# Patient Record
Sex: Female | Born: 1945 | Race: White | Hispanic: No | Smoking: Current every day smoker
Health system: Southern US, Community
[De-identification: ages and names within clinical notes are randomized; demographics above are authoritative.]

## PROBLEM LIST (undated history)

## (undated) DIAGNOSIS — M545 Low back pain, unspecified: Secondary | ICD-10-CM

## (undated) DIAGNOSIS — H579 Unspecified disorder of eye and adnexa: Secondary | ICD-10-CM

## (undated) DIAGNOSIS — J449 Chronic obstructive pulmonary disease, unspecified: Secondary | ICD-10-CM

## (undated) DIAGNOSIS — R112 Nausea with vomiting, unspecified: Secondary | ICD-10-CM

## (undated) DIAGNOSIS — N182 Chronic kidney disease, stage 2 (mild): Secondary | ICD-10-CM

## (undated) DIAGNOSIS — F419 Anxiety disorder, unspecified: Secondary | ICD-10-CM

## (undated) DIAGNOSIS — I10 Essential (primary) hypertension: Secondary | ICD-10-CM

## (undated) DIAGNOSIS — G8929 Other chronic pain: Secondary | ICD-10-CM

## (undated) DIAGNOSIS — Z9889 Other specified postprocedural states: Secondary | ICD-10-CM

## (undated) DIAGNOSIS — M1990 Unspecified osteoarthritis, unspecified site: Secondary | ICD-10-CM

## (undated) DIAGNOSIS — M459 Ankylosing spondylitis of unspecified sites in spine: Secondary | ICD-10-CM

## (undated) HISTORY — DX: Anxiety disorder, unspecified: F41.9

## (undated) HISTORY — PX: CATARACT EXTRACTION W/ INTRAOCULAR LENS  IMPLANT, BILATERAL: SHX1307

## (undated) HISTORY — PX: MANDIBLE SURGERY: SHX707

## (undated) HISTORY — DX: Unspecified disorder of eye and adnexa: H57.9

## (undated) HISTORY — PX: BROW LIFT AND BLEPHAROPLASTY: SHX1271

## (undated) HISTORY — DX: Unspecified osteoarthritis, unspecified site: M19.90

---

## 1977-07-15 HISTORY — PX: TUBAL LIGATION: SHX77

## 1983-07-16 HISTORY — PX: VAGINAL HYSTERECTOMY: SUR661

## 2000-02-04 ENCOUNTER — Other Ambulatory Visit: Admission: RE | Admit: 2000-02-04 | Discharge: 2000-02-04 | Payer: Self-pay | Admitting: Family Medicine

## 2000-02-29 ENCOUNTER — Encounter: Admission: RE | Admit: 2000-02-29 | Discharge: 2000-02-29 | Payer: Self-pay | Admitting: Family Medicine

## 2000-02-29 ENCOUNTER — Encounter: Payer: Self-pay | Admitting: Family Medicine

## 2001-03-23 ENCOUNTER — Other Ambulatory Visit: Admission: RE | Admit: 2001-03-23 | Discharge: 2001-03-23 | Payer: Self-pay | Admitting: Family Medicine

## 2001-04-03 ENCOUNTER — Encounter: Admission: RE | Admit: 2001-04-03 | Discharge: 2001-04-03 | Payer: Self-pay | Admitting: Family Medicine

## 2001-04-03 ENCOUNTER — Encounter: Payer: Self-pay | Admitting: Family Medicine

## 2001-04-27 ENCOUNTER — Encounter: Admission: RE | Admit: 2001-04-27 | Discharge: 2001-04-27 | Payer: Self-pay | Admitting: Family Medicine

## 2001-04-27 ENCOUNTER — Encounter: Payer: Self-pay | Admitting: Family Medicine

## 2003-06-24 ENCOUNTER — Other Ambulatory Visit: Admission: RE | Admit: 2003-06-24 | Discharge: 2003-06-24 | Payer: Self-pay | Admitting: Family Medicine

## 2003-08-12 ENCOUNTER — Encounter: Admission: RE | Admit: 2003-08-12 | Discharge: 2003-08-12 | Payer: Self-pay | Admitting: Family Medicine

## 2003-09-26 ENCOUNTER — Ambulatory Visit (HOSPITAL_COMMUNITY): Admission: RE | Admit: 2003-09-26 | Discharge: 2003-09-26 | Payer: Self-pay | Admitting: Gastroenterology

## 2004-12-28 ENCOUNTER — Encounter: Admission: RE | Admit: 2004-12-28 | Discharge: 2004-12-28 | Payer: Self-pay | Admitting: Family Medicine

## 2005-12-02 ENCOUNTER — Other Ambulatory Visit: Admission: RE | Admit: 2005-12-02 | Discharge: 2005-12-02 | Payer: Self-pay | Admitting: Family Medicine

## 2005-12-30 ENCOUNTER — Encounter: Admission: RE | Admit: 2005-12-30 | Discharge: 2005-12-30 | Payer: Self-pay | Admitting: Family Medicine

## 2006-06-02 ENCOUNTER — Encounter: Admission: RE | Admit: 2006-06-02 | Discharge: 2006-06-02 | Payer: Self-pay | Admitting: Family Medicine

## 2006-07-21 ENCOUNTER — Encounter: Admission: RE | Admit: 2006-07-21 | Discharge: 2006-07-21 | Payer: Self-pay | Admitting: Family Medicine

## 2006-07-30 ENCOUNTER — Encounter: Admission: RE | Admit: 2006-07-30 | Discharge: 2006-07-30 | Payer: Self-pay | Admitting: Family Medicine

## 2006-09-02 ENCOUNTER — Ambulatory Visit (HOSPITAL_COMMUNITY): Admission: RE | Admit: 2006-09-02 | Discharge: 2006-09-02 | Payer: Self-pay | Admitting: Family Medicine

## 2006-10-22 ENCOUNTER — Ambulatory Visit: Payer: Self-pay | Admitting: Critical Care Medicine

## 2007-09-02 ENCOUNTER — Encounter: Admission: RE | Admit: 2007-09-02 | Discharge: 2007-09-02 | Payer: Self-pay | Admitting: Family Medicine

## 2007-09-08 ENCOUNTER — Encounter: Admission: RE | Admit: 2007-09-08 | Discharge: 2007-09-08 | Payer: Self-pay | Admitting: Family Medicine

## 2008-09-19 ENCOUNTER — Encounter: Admission: RE | Admit: 2008-09-19 | Discharge: 2008-09-19 | Payer: Self-pay | Admitting: Family Medicine

## 2009-04-11 ENCOUNTER — Other Ambulatory Visit: Admission: RE | Admit: 2009-04-11 | Discharge: 2009-04-11 | Payer: Self-pay | Admitting: Family Medicine

## 2010-05-15 ENCOUNTER — Encounter: Admission: RE | Admit: 2010-05-15 | Discharge: 2010-05-15 | Payer: Self-pay | Admitting: Family Medicine

## 2010-05-28 ENCOUNTER — Encounter: Admission: RE | Admit: 2010-05-28 | Discharge: 2010-05-28 | Payer: Self-pay | Admitting: Family Medicine

## 2010-08-05 ENCOUNTER — Encounter: Payer: Self-pay | Admitting: Family Medicine

## 2010-11-30 NOTE — Assessment & Plan Note (Signed)
[facility]                             PULMONARY OFFICE NOTE   NAME:Kirsten Harper, Kirsten Harper                      MRN:          [id number]  DATE:10/22/2006                            DOB:          [birth year]    CHIEF COMPLAINT:  Cyclic cough.   HISTORY OF PRESENT ILLNESS:  This is a 65 year old female who has a  history of cyclic cough. Her symptoms have been progressing over the  past several months. The cough is dry in nature and not productive. She  has had minimal shortness of breath with exertion. She notes postnasal  drainage and took Mucinex without much improvement. First started seeing  Dr. Valentina Lucks for this in November of this past year. She was concerned  about the cough because she has had a parent who has died of lung  cancer. The cough is worse when she lays flat. Initially thought to have  postnasal drip syndrome but the patient did not really improve with  sinus type treatments. She was seen again in January and was told  to  have a CT scan done because of her continued symptom complexes. The CT  scan was unremarkable, as was the chest x-ray but the cough became  uncontrolled and because of this, she was referred for further  evaluation. She was given a dose of Tussionex with some or minimal  improvement in her symptom complex and now she is referred for further  evaluation. There has been some indigestion, acid heartburn, no  irregular heart beats, no dysphagia, sore throat, tooth, or dental  problems, no nasal congestion, sneezing, itching, ear ache, anxiety, or  depression.   SOCIAL HISTORY:  The patient does continue to smoke on an active basis,  has done so for many years. Social history is otherwise noncontributory.  The patient is divorced, has 2 grown sons, does not drink, does continue  to smoke.   FAMILY HISTORY:  Positive for cancer in a parent. Allergies and asthma  in a son. No history of diabetes or hypertension.   REVIEW  OF SYSTEMS:  Otherwise noncontributory.   CURRENT MEDICATIONS:  1. Amitriptyline 150 mg daily.  2. Benazepril daily.   MEDICATION ALLERGIES:  None.   OPERATIVE HISTORY:  Includes that of hysterectomy 1985, jaw surgery  1999.   OTHER MEDICAL HISTORY:  Includes that of ankylosing spondylitis, mild  obstructive defect on PFTs.   PHYSICAL EXAMINATION:  This is an obese female in no distress.  Temperature 97.8, blood pressure 110/64, pulse 90, saturation was 96% on  room air.  CHEST: Showed to be clear with a few expired wheezes with forced  exhalation.  CARDIAC EXAM: Showed a regular rate and rhythm without S3. Normal S1,  S2.  ABDOMEN: Soft, protuberant. Bowel sounds were active.  EXTREMITIES: Showed no edema, clubbing, or __________ disease.  SKIN: Clear.  NEUROLOGIC EXAM: Intact.  HEENT EXAM: Clear.   LABORATORY DATA:  Pulmonary functions on September 02, 2006 show an FEV 1  of 83% predicted, FVC of 90% predicted, FEV 1, FVC ratio of 87%  predicted.  Chest CT scan on  July 30, 2006 showed no abnormalities seen and  essentially negative CT chest, same thing went for the chest x-ray done  previously.   IMPRESSION:  That of asthmatic bronchitis with associated cyclic cough  exacerbated by reflux disease, active smoking, postnasal drip syndrome  with allergic rhinitis, and ACE inhibitor use.   RECOMMENDATIONS:  Would be discontinue the ACE inhibitor and begin  Benicar 20 mg daily, begin Zegerid 40 mg daily for reflux component,  begin Qvar 40 mcg strength 2 sprays b.i.d. for any inflammatory  component of the lower airway. Patient was given a reflux diet and also  a prescription for BroveX 5 cc b.i.d. p.r.n. postnasal drip and a  prescription for a Nicotrol inhaler to pursue smoking cessation. We will  see the patient back in return follow up in 6 weeks.     Charlcie Cradle Delford Field, MD, [facility]  Electronically Signed    PEW/MedQ  DD: 11/04/2006  DT: 11/04/2006  Job #:  [id number]   cc:   Gretta Arab. Valentina Lucks, M.D.

## 2010-11-30 NOTE — Op Note (Signed)
NAME:  Kirsten Harper, Kirsten Harper                         ACCOUNT NO.:  [id number]   MEDICAL RECORD NO.:  [id number]                   PATIENT TYPE:  AMB   LOCATION:  ENDO                                 FACILITY:  [facility]   PHYSICIAN:  John C. Madilyn Fireman, M.D.                 DATE OF BIRTH:  [birth year]   DATE OF PROCEDURE:  09/26/2003  DATE OF DISCHARGE:                                 OPERATIVE REPORT   PROCEDURE:  Colonoscopy.   INDICATIONS FOR PROCEDURE:  Colon cancer screening in a 65 year old patient  with no prior screening.   DESCRIPTION OF PROCEDURE:  The patient was placed in the left lateral  decubitus position and placed on the pulse monitor with continuous low-flow  oxygen delivered by nasal cannula.  She was sedated with 100 mcg IV fentanyl  and 10 mg IV Versed.  The Olympus video colonoscope was inserted into the  rectum and advanced to the cecum, confirmed by transillumination of  McBurney's point and visualization of the ileocecal valve and appendiceal  orifice.  Prep was somewhat suboptimal, particularly the cecum and ascending  colon and I could not rule out lesions less than 1 cm in all areas.  Otherwise the cecum, ascending,  transverse, descending, and sigmoid colon  all appeared normal with no masses, polyps, diverticula, or other mucosal  abnormalities.  The rectum likewise appeared normal and retroflexed view of  the anus revealed no obvious internal hemorrhoids.  The scope was then  withdrawn and the patient returned to the recovery room in stable condition.  She tolerated the procedure well and there were no immediate complications.   IMPRESSION:  Normal screening colonoscopy with some slightly limited prep.   PLAN:  The next colon screening by sigmoidoscopy in five years.                                               John C. Madilyn Fireman, M.D.    JCH/MEDQ  D:  09/26/2003  T:  09/26/2003  Job:  [id number]   cc:   Gretta Arab. Valentina Lucks, M.D.  [address] [place]  [place] [address]  Fax: [phone]

## 2011-04-24 ENCOUNTER — Other Ambulatory Visit: Payer: Self-pay | Admitting: Family Medicine

## 2011-04-24 DIAGNOSIS — Z1231 Encounter for screening mammogram for malignant neoplasm of breast: Secondary | ICD-10-CM

## 2011-05-30 ENCOUNTER — Ambulatory Visit
Admission: RE | Admit: 2011-05-30 | Discharge: 2011-05-30 | Disposition: A | Discharge status: Home / Self Care | Payer: 59 | Source: Ambulatory Visit | Attending: Family Medicine | Admitting: Family Medicine

## 2011-05-30 DIAGNOSIS — Z1231 Encounter for screening mammogram for malignant neoplasm of breast: Secondary | ICD-10-CM

## 2012-01-24 ENCOUNTER — Ambulatory Visit (INDEPENDENT_AMBULATORY_CARE_PROVIDER_SITE_OTHER): Payer: 59 | Admitting: Family Medicine

## 2012-01-24 VITALS — BP 115/60 | HR 87 | Temp 98.2°F | Resp 18 | Ht 67.0 in | Wt 193.0 lb

## 2012-01-24 DIAGNOSIS — J40 Bronchitis, not specified as acute or chronic: Secondary | ICD-10-CM

## 2012-01-24 DIAGNOSIS — J329 Chronic sinusitis, unspecified: Secondary | ICD-10-CM

## 2012-01-24 MED ORDER — LEVOFLOXACIN 500 MG PO TABS: 500.0000 mg | ORAL_TABLET | Freq: Every day | ORAL | Status: AC

## 2012-01-24 MED ORDER — FLUTICASONE PROPIONATE 50 MCG/ACT NA SUSP: 2.0000 | Freq: Every day | NASAL | Status: DC

## 2012-01-24 MED ORDER — HYDROCODONE-HOMATROPINE 5-1.5 MG/5ML PO SYRP: 5.0000 mL | ORAL_SOLUTION | Freq: Three times a day (TID) | ORAL | Status: AC | PRN

## 2012-01-24 NOTE — Progress Notes (Signed)
66 yo woman with one month of sinus congestion which has progressed into a deep cough.  Not running fevers.  No h/o asthma  Patient does smoke.  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 Sinusitis  levofloxacin (LEVAQUIN) 500 MG tablet, fluticasone (FLONASE) 50 MCG/ACT nasal spray  2. Bronchitis  HYDROcodone-homatropine (HYCODAN) 5-1.5 MG/5ML syrup

## 2012-05-18 ENCOUNTER — Other Ambulatory Visit: Payer: Self-pay | Admitting: Family Medicine

## 2012-05-18 DIAGNOSIS — Z1231 Encounter for screening mammogram for malignant neoplasm of breast: Secondary | ICD-10-CM

## 2012-06-03 ENCOUNTER — Ambulatory Visit: Payer: 59

## 2012-06-04 ENCOUNTER — Ambulatory Visit: Payer: 59

## 2012-07-09 ENCOUNTER — Ambulatory Visit
Admission: RE | Admit: 2012-07-09 | Discharge: 2012-07-09 | Disposition: A | Discharge status: Home / Self Care | Payer: 59 | Source: Ambulatory Visit | Attending: Family Medicine | Admitting: Family Medicine

## 2012-07-09 DIAGNOSIS — Z1231 Encounter for screening mammogram for malignant neoplasm of breast: Secondary | ICD-10-CM

## 2013-06-21 ENCOUNTER — Other Ambulatory Visit: Payer: Self-pay

## 2013-06-21 DIAGNOSIS — Z1231 Encounter for screening mammogram for malignant neoplasm of breast: Secondary | ICD-10-CM

## 2013-07-20 ENCOUNTER — Ambulatory Visit
Admission: RE | Admit: 2013-07-20 | Discharge: 2013-07-20 | Disposition: A | Discharge status: Home / Self Care | Payer: No Typology Code available for payment source | Source: Ambulatory Visit

## 2013-07-20 DIAGNOSIS — Z1231 Encounter for screening mammogram for malignant neoplasm of breast: Secondary | ICD-10-CM

## 2013-09-21 DIAGNOSIS — M479 Spondylosis, unspecified: Secondary | ICD-10-CM | POA: Diagnosis not present

## 2013-09-21 DIAGNOSIS — J329 Chronic sinusitis, unspecified: Secondary | ICD-10-CM | POA: Diagnosis not present

## 2013-10-06 DIAGNOSIS — K5730 Diverticulosis of large intestine without perforation or abscess without bleeding: Secondary | ICD-10-CM | POA: Diagnosis not present

## 2013-10-06 DIAGNOSIS — Z1211 Encounter for screening for malignant neoplasm of colon: Secondary | ICD-10-CM | POA: Diagnosis not present

## 2013-10-06 DIAGNOSIS — D126 Benign neoplasm of colon, unspecified: Secondary | ICD-10-CM | POA: Diagnosis not present

## 2013-10-18 DIAGNOSIS — M2550 Pain in unspecified joint: Secondary | ICD-10-CM | POA: Diagnosis not present

## 2013-10-18 DIAGNOSIS — M479 Spondylosis, unspecified: Secondary | ICD-10-CM | POA: Diagnosis not present

## 2013-10-26 DIAGNOSIS — H02839 Dermatochalasis of unspecified eye, unspecified eyelid: Secondary | ICD-10-CM | POA: Diagnosis not present

## 2013-11-11 DIAGNOSIS — F329 Major depressive disorder, single episode, unspecified: Secondary | ICD-10-CM | POA: Diagnosis not present

## 2013-12-02 DIAGNOSIS — F329 Major depressive disorder, single episode, unspecified: Secondary | ICD-10-CM | POA: Diagnosis not present

## 2013-12-16 DIAGNOSIS — F329 Major depressive disorder, single episode, unspecified: Secondary | ICD-10-CM | POA: Diagnosis not present

## 2013-12-27 DIAGNOSIS — F329 Major depressive disorder, single episode, unspecified: Secondary | ICD-10-CM | POA: Diagnosis not present

## 2014-01-18 ENCOUNTER — Encounter: Payer: Self-pay | Admitting: Rheumatology

## 2014-01-18 DIAGNOSIS — R5381 Other malaise: Secondary | ICD-10-CM | POA: Diagnosis not present

## 2014-01-18 DIAGNOSIS — Z79899 Other long term (current) drug therapy: Secondary | ICD-10-CM | POA: Diagnosis not present

## 2014-01-18 DIAGNOSIS — M25579 Pain in unspecified ankle and joints of unspecified foot: Secondary | ICD-10-CM | POA: Diagnosis not present

## 2014-01-18 DIAGNOSIS — M25549 Pain in joints of unspecified hand: Secondary | ICD-10-CM | POA: Diagnosis not present

## 2014-01-18 DIAGNOSIS — M459 Ankylosing spondylitis of unspecified sites in spine: Secondary | ICD-10-CM | POA: Diagnosis not present

## 2014-01-18 DIAGNOSIS — M2550 Pain in unspecified joint: Secondary | ICD-10-CM | POA: Diagnosis not present

## 2014-01-18 DIAGNOSIS — M533 Sacrococcygeal disorders, not elsewhere classified: Secondary | ICD-10-CM | POA: Diagnosis not present

## 2014-01-27 DIAGNOSIS — H0230 Blepharochalasis unspecified eye, unspecified eyelid: Secondary | ICD-10-CM | POA: Diagnosis not present

## 2014-01-27 DIAGNOSIS — H02409 Unspecified ptosis of unspecified eyelid: Secondary | ICD-10-CM | POA: Diagnosis not present

## 2014-02-16 DIAGNOSIS — N6089 Other benign mammary dysplasias of unspecified breast: Secondary | ICD-10-CM | POA: Diagnosis not present

## 2014-03-31 DIAGNOSIS — E550 Rickets, active: Secondary | ICD-10-CM | POA: Diagnosis not present

## 2014-03-31 DIAGNOSIS — F329 Major depressive disorder, single episode, unspecified: Secondary | ICD-10-CM | POA: Diagnosis not present

## 2014-04-04 DIAGNOSIS — Z23 Encounter for immunization: Secondary | ICD-10-CM | POA: Diagnosis not present

## 2014-04-11 ENCOUNTER — Other Ambulatory Visit: Payer: Self-pay | Admitting: Rheumatology

## 2014-04-11 ENCOUNTER — Ambulatory Visit
Admission: RE | Admit: 2014-04-11 | Discharge: 2014-04-11 | Disposition: A | Discharge status: Home / Self Care | Payer: No Typology Code available for payment source | Source: Ambulatory Visit | Attending: Rheumatology | Admitting: Rheumatology

## 2014-04-11 DIAGNOSIS — R05 Cough: Secondary | ICD-10-CM

## 2014-04-11 DIAGNOSIS — R059 Cough, unspecified: Secondary | ICD-10-CM

## 2014-04-11 DIAGNOSIS — J984 Other disorders of lung: Secondary | ICD-10-CM | POA: Diagnosis not present

## 2014-04-11 DIAGNOSIS — F17200 Nicotine dependence, unspecified, uncomplicated: Secondary | ICD-10-CM | POA: Diagnosis not present

## 2014-04-11 DIAGNOSIS — R748 Abnormal levels of other serum enzymes: Secondary | ICD-10-CM | POA: Diagnosis not present

## 2014-04-19 DIAGNOSIS — M533 Sacrococcygeal disorders, not elsewhere classified: Secondary | ICD-10-CM | POA: Diagnosis not present

## 2014-04-19 DIAGNOSIS — M79671 Pain in right foot: Secondary | ICD-10-CM | POA: Diagnosis not present

## 2014-04-19 DIAGNOSIS — M79641 Pain in right hand: Secondary | ICD-10-CM | POA: Diagnosis not present

## 2014-04-19 DIAGNOSIS — M459 Ankylosing spondylitis of unspecified sites in spine: Secondary | ICD-10-CM | POA: Diagnosis not present

## 2014-05-03 DIAGNOSIS — H0236 Blepharochalasis left eye, unspecified eyelid: Secondary | ICD-10-CM | POA: Diagnosis not present

## 2014-05-03 DIAGNOSIS — I4510 Unspecified right bundle-branch block: Secondary | ICD-10-CM | POA: Diagnosis not present

## 2014-05-03 DIAGNOSIS — Z87891 Personal history of nicotine dependence: Secondary | ICD-10-CM | POA: Diagnosis not present

## 2014-05-03 DIAGNOSIS — H02403 Unspecified ptosis of bilateral eyelids: Secondary | ICD-10-CM | POA: Diagnosis not present

## 2014-05-03 DIAGNOSIS — I10 Essential (primary) hypertension: Secondary | ICD-10-CM | POA: Diagnosis not present

## 2014-05-03 DIAGNOSIS — H0233 Blepharochalasis right eye, unspecified eyelid: Secondary | ICD-10-CM | POA: Diagnosis not present

## 2014-05-03 DIAGNOSIS — H0230 Blepharochalasis unspecified eye, unspecified eyelid: Secondary | ICD-10-CM | POA: Diagnosis not present

## 2014-05-03 DIAGNOSIS — Z01810 Encounter for preprocedural cardiovascular examination: Secondary | ICD-10-CM | POA: Diagnosis not present

## 2014-05-11 DIAGNOSIS — L908 Other atrophic disorders of skin: Secondary | ICD-10-CM | POA: Diagnosis not present

## 2014-05-11 DIAGNOSIS — H02403 Unspecified ptosis of bilateral eyelids: Secondary | ICD-10-CM | POA: Diagnosis not present

## 2014-05-11 DIAGNOSIS — H02834 Dermatochalasis of left upper eyelid: Secondary | ICD-10-CM | POA: Diagnosis not present

## 2014-05-11 DIAGNOSIS — H02433 Paralytic ptosis of bilateral eyelids: Secondary | ICD-10-CM | POA: Diagnosis not present

## 2014-05-11 DIAGNOSIS — F329 Major depressive disorder, single episode, unspecified: Secondary | ICD-10-CM | POA: Diagnosis not present

## 2014-05-11 DIAGNOSIS — H0231 Blepharochalasis right upper eyelid: Secondary | ICD-10-CM | POA: Diagnosis not present

## 2014-05-11 DIAGNOSIS — F419 Anxiety disorder, unspecified: Secondary | ICD-10-CM | POA: Diagnosis not present

## 2014-05-11 DIAGNOSIS — I10 Essential (primary) hypertension: Secondary | ICD-10-CM | POA: Diagnosis not present

## 2014-05-11 DIAGNOSIS — H0234 Blepharochalasis left upper eyelid: Secondary | ICD-10-CM | POA: Diagnosis not present

## 2014-05-11 DIAGNOSIS — Z87891 Personal history of nicotine dependence: Secondary | ICD-10-CM | POA: Diagnosis not present

## 2014-05-11 DIAGNOSIS — H02831 Dermatochalasis of right upper eyelid: Secondary | ICD-10-CM | POA: Diagnosis not present

## 2014-05-17 DIAGNOSIS — Z9889 Other specified postprocedural states: Secondary | ICD-10-CM | POA: Diagnosis not present

## 2014-05-17 DIAGNOSIS — Z4889 Encounter for other specified surgical aftercare: Secondary | ICD-10-CM | POA: Diagnosis not present

## 2014-05-19 DIAGNOSIS — F329 Major depressive disorder, single episode, unspecified: Secondary | ICD-10-CM | POA: Diagnosis not present

## 2014-05-24 DIAGNOSIS — Z4889 Encounter for other specified surgical aftercare: Secondary | ICD-10-CM | POA: Diagnosis not present

## 2014-05-24 DIAGNOSIS — Z9889 Other specified postprocedural states: Secondary | ICD-10-CM | POA: Diagnosis not present

## 2014-06-02 ENCOUNTER — Ambulatory Visit (INDEPENDENT_AMBULATORY_CARE_PROVIDER_SITE_OTHER): Payer: Medicare Other | Admitting: Family Medicine

## 2014-06-02 VITALS — BP 142/72 | HR 108 | Temp 97.9°F | Resp 18 | Ht 66.0 in | Wt 194.0 lb

## 2014-06-02 DIAGNOSIS — S0180XA Unspecified open wound of other part of head, initial encounter: Secondary | ICD-10-CM

## 2014-06-02 MED ORDER — DESONIDE 0.05 % EX CREA: TOPICAL_CREAM | Freq: Two times a day (BID) | CUTANEOUS | Status: DC

## 2014-06-02 MED ORDER — DOXYCYCLINE HYCLATE 100 MG PO CAPS: 100.0000 mg | ORAL_CAPSULE | Freq: Two times a day (BID) | ORAL | Status: DC

## 2014-06-02 NOTE — Progress Notes (Signed)
Subjective:  This chart was scribed for Reginia Forts, MD by Randa Evens, ED Scribe. This Patient was seen in room 10 and the patients care was started at 4:51 PM   Patient ID: Kirsten Harper, female    DOB: 1945-12-13, 68 y.o.   MRN: [id number]  06/02/2014  Rash  HPI  HPI Comments: Kirsten Harper is a 68 y.o. female who presents to the [facility] complaining of progressively worsening rash on her forehead onset 1 week ago. She states she has been having some slight drainage from the area. She states that she has tried applying neosporin and hydrogen peroxide with no relief. She states she has also been applying another antibiotic ointment she was prescribed with no relief. She states that the area is not itchy. She states she is concerned that she may have a staph infection. She states she recently had eye lid surgery and that the rash may be from a head clamp they may have used to hold her head in place. Denies fever or other related symptoms.   Review of Systems  Constitutional: Negative for fever, chills and diaphoresis.  Skin: Positive for color change, rash and wound.  Neurological: Negative for numbness.    Past Medical History  Diagnosis Date  . Anxiety   . Arthritis   . Cataract   . Depression    Past Surgical History  Procedure Laterality Date  . Eye surgery    . Abdominal hysterectomy    . Tubal ligation     Allergies  Allergen Reactions  . Amoxapine And Related    Current Outpatient Prescriptions  Medication Sig Dispense Refill  . amitriptyline (ELAVIL) 75 MG tablet Take 150 mg by mouth at bedtime.     . indomethacin (INDOCIN SR) 75 MG CR capsule Take 75 mg by mouth 2 (two) times daily with a meal.    . losartan (COZAAR) 100 MG tablet Take 100 mg by mouth daily.    Marland Kitchen albuterol (PROVENTIL) (2.5 MG/3ML) 0.083% nebulizer solution Take 2.5 mg by nebulization every 6 (six) hours as needed.    . desonide (DESOWEN) 0.05 % cream Apply topically 2  (two) times daily. 30 g 0  . doxycycline (VIBRAMYCIN) 100 MG capsule Take 1 capsule (100 mg total) by mouth 2 (two) times daily. 14 capsule 0  . fluticasone (FLONASE) 50 MCG/ACT nasal spray Place 2 sprays into the nose daily. 1 g 0   No current facility-administered medications for this visit.       Objective:    BP 142/72 mmHg  Pulse 108  Temp(Src) 97.9 F (36.6 C) (Oral)  Resp 18  Ht 5\' 6"  (1.676 m)  Wt 194 lb (87.998 kg)  BMI 31.33 kg/m2  SpO2 97%   Physical Exam  Constitutional: She is oriented to person, place, and time. She appears well-developed and well-nourished. No distress.  HENT:  Head: Normocephalic and atraumatic.    Right forehead: 8 mm superficial wound without redness, drainage or swelling.  Left forehead: 1 cm superficial open wound with surrounding dry crusting with minimal erythema with no fluctuance.   Well healed wounds along hair line.   Eyes: Conjunctivae and EOM are normal.  Neck: Neck supple. No tracheal deviation present.  Cardiovascular: Normal rate.   Pulmonary/Chest: Effort normal. No respiratory distress.  Musculoskeletal: Normal range of motion.  Neurological: She is alert and oriented to person, place, and time.  Skin: Skin is warm and dry.  Psychiatric: She has a  normal mood and affect. Her behavior is normal.  Nursing note and vitals reviewed.    No results found for this or any previous visit.     Assessment & Plan:   1. Wound, open, face, initial encounter     1. Wound L forehead with early infection:  New.  Rx for Doxycycline provided; continue rx topical antibiotic cream prescribed by surgeon. Rx for Desonide provided to use q d.  RTC fever, pain, redness, increasing drainage.    Meds ordered this encounter  Medications  . doxycycline (VIBRAMYCIN) 100 MG capsule    Sig: Take 1 capsule (100 mg total) by mouth 2 (two) times daily.    Dispense:  14 capsule    Refill:  0  . desonide (DESOWEN) 0.05 % cream    Sig: Apply  topically 2 (two) times daily.    Dispense:  30 g    Refill:  0    No Follow-up on file.    I personally performed the services described in this documentation, which was scribed in my presence. The recorded information has been reviewed and considered.   Reginia Forts, M.D.  [facility] [address] [place],   [phone] phone [phone] fax

## 2014-06-02 NOTE — Patient Instructions (Signed)
1.  Return for fever > 100.5, increasing redness/swelling/drainage.

## 2014-06-05 LAB — WOUND CULTURE
Gram Stain: NONE SEEN
Gram Stain: NONE SEEN
ORGANISM ID, BACTERIA: NO GROWTH

## 2014-07-11 DIAGNOSIS — H02401 Unspecified ptosis of right eyelid: Secondary | ICD-10-CM | POA: Diagnosis not present

## 2014-07-11 DIAGNOSIS — Z87891 Personal history of nicotine dependence: Secondary | ICD-10-CM | POA: Diagnosis not present

## 2014-07-11 DIAGNOSIS — Z9889 Other specified postprocedural states: Secondary | ICD-10-CM | POA: Diagnosis not present

## 2014-07-11 DIAGNOSIS — I10 Essential (primary) hypertension: Secondary | ICD-10-CM | POA: Diagnosis not present

## 2014-07-11 DIAGNOSIS — Z4889 Encounter for other specified surgical aftercare: Secondary | ICD-10-CM | POA: Diagnosis not present

## 2014-07-19 DIAGNOSIS — J069 Acute upper respiratory infection, unspecified: Secondary | ICD-10-CM | POA: Diagnosis not present

## 2014-07-20 ENCOUNTER — Ambulatory Visit (INDEPENDENT_AMBULATORY_CARE_PROVIDER_SITE_OTHER): Payer: Medicare Other

## 2014-07-20 ENCOUNTER — Ambulatory Visit (INDEPENDENT_AMBULATORY_CARE_PROVIDER_SITE_OTHER): Payer: Medicare Other | Admitting: Family Medicine

## 2014-07-20 VITALS — BP 130/80 | HR 111 | Temp 99.0°F | Resp 16 | Ht 66.5 in | Wt 190.0 lb

## 2014-07-20 DIAGNOSIS — R05 Cough: Secondary | ICD-10-CM

## 2014-07-20 DIAGNOSIS — J208 Acute bronchitis due to other specified organisms: Secondary | ICD-10-CM

## 2014-07-20 DIAGNOSIS — R062 Wheezing: Secondary | ICD-10-CM

## 2014-07-20 DIAGNOSIS — R059 Cough, unspecified: Secondary | ICD-10-CM

## 2014-07-20 DIAGNOSIS — R109 Unspecified abdominal pain: Secondary | ICD-10-CM | POA: Diagnosis not present

## 2014-07-20 LAB — POCT CBC
GRANULOCYTE PERCENT: 77.1 % (ref 37–80)
HCT, POC: 45.4 % (ref 37.7–47.9)
HEMOGLOBIN: 15.2 g/dL (ref 12.2–16.2)
LYMPH, POC: 1.4 (ref 0.6–3.4)
MCH: 30.8 pg (ref 27–31.2)
MCHC: 33.6 g/dL (ref 31.8–35.4)
MCV: 91.8 fL (ref 80–97)
MID (cbc): 0.6 (ref 0–0.9)
MPV: 7.3 fL (ref 0–99.8)
PLATELET COUNT, POC: 220 10*3/uL (ref 142–424)
POC Granulocyte: 6.7 (ref 2–6.9)
POC LYMPH %: 16.4 % (ref 10–50)
POC MID %: 6.5 % (ref 0–12)
RBC: 4.94 M/uL (ref 4.04–5.48)
RDW, POC: 138 %
WBC: 8.7 10*3/uL (ref 4.6–10.2)

## 2014-07-20 MED ORDER — ALBUTEROL SULFATE (2.5 MG/3ML) 0.083% IN NEBU
2.5000 mg | INHALATION_SOLUTION | Freq: Once | RESPIRATORY_TRACT | Status: AC
  Administered 2014-07-20: 2.5 mg via RESPIRATORY_TRACT

## 2014-07-20 MED ORDER — ALBUTEROL SULFATE HFA 108 (90 BASE) MCG/ACT IN AERS: 2.0000 | INHALATION_SPRAY | RESPIRATORY_TRACT | Status: DC | PRN

## 2014-07-20 MED ORDER — HYDROCODONE-HOMATROPINE 5-1.5 MG/5ML PO SYRP: 5.0000 mL | ORAL_SOLUTION | ORAL | Status: DC | PRN

## 2014-07-20 MED ORDER — BENZONATATE 100 MG PO CAPS: 100.0000 mg | ORAL_CAPSULE | Freq: Three times a day (TID) | ORAL | Status: DC | PRN

## 2014-07-20 MED ORDER — PREDNISONE 20 MG PO TABS: 20.0000 mg | ORAL_TABLET | Freq: Every day | ORAL | Status: DC

## 2014-07-20 NOTE — Progress Notes (Signed)
Subjective: 69 year old lady who has had a respiratory tract infection which began Saturday. She had some sore throat and congestion. She's had a lot of crusting in her nose. Starting yesterday she began having a bad cough and wheezing. She is not coughing up a lot of stuff. She quit smoking about 6 months ago. She doesn't remember having such bad cold over. She has not been running high fevers. 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  Objective: TMs are normal. Nose doesn't look that inflamed right now. Throat is clear. Neck supple without significant nodes. Chest has scattered rhonchi and wheezes bilaterally. Heart regular without murmurs.  Assessment: Viral bronchitis Wheezing  Plan: Albuterol nebulizer treatment, chest x-ray, CBC   [facility] reading (PRIMARY) by  Dr. Linna Darner Normal chest x-ray  Results for orders placed or performed in visit on 07/20/14  POCT CBC  Result Value Ref Range   WBC 8.7 4.6 - 10.2 K/uL   Lymph, poc 1.4 0.6 - 3.4   POC LYMPH PERCENT 16.4 10 - 50 %L   MID (cbc) 0.6 0 - 0.9   POC MID % 6.5 0 - 12 %M   POC Granulocyte 6.7 2 - 6.9   Granulocyte percent 77.1 37 - 80 %G   RBC 4.94 4.04 - 5.48 M/uL   Hemoglobin 15.2 12.2 - 16.2 g/dL   HCT, POC 45.4 37.7 - 47.9 %   MCV 91.8 80 - 97 fL   MCH, POC 30.8 27 - 31.2 pg   MCHC 33.6 31.8 - 35.4 g/dL   RDW, POC 138 %   Platelet Count, POC 220 142 - 424 K/uL   MPV 7.3 0 - 99.8 fL   . Will treat with prednisone, Hycodan, albuterol, and Tessalon. She was moving air better after the treatment.

## 2014-07-20 NOTE — Patient Instructions (Signed)
Drink plenty of fluids and get enough rest  Use the inhaler 2 inhalations every 4-6 hours as needed.  In the event of more difficulty breathing go to the emergency room or call 911 or return here.  Take the hydrocodone cough syrup 1 teaspoon every 4-6 hours as needed for cough. This will cause sedation, and is best not taken when you're going to work.  Take the benzonatate (Tessalon) cough pills one or 2 pills 3 times daily as needed for cough. This will not work as well as the hydrocodone, but will take the tickle out of your throat some and is helpful when you cannot afford to be drowsy from the cough syrup.  Take the prednisone 3 pills daily for 2 days, 2 daily for 2 days, and one daily for 2 days for inflammation in the lungs and to help stop wheezing.  Return if needed

## 2014-07-28 DIAGNOSIS — M79671 Pain in right foot: Secondary | ICD-10-CM | POA: Diagnosis not present

## 2014-07-28 DIAGNOSIS — M489 Spondylopathy, unspecified: Secondary | ICD-10-CM | POA: Diagnosis not present

## 2014-07-28 DIAGNOSIS — M533 Sacrococcygeal disorders, not elsewhere classified: Secondary | ICD-10-CM | POA: Diagnosis not present

## 2014-07-28 DIAGNOSIS — M79641 Pain in right hand: Secondary | ICD-10-CM | POA: Diagnosis not present

## 2014-08-02 DIAGNOSIS — Z79899 Other long term (current) drug therapy: Secondary | ICD-10-CM | POA: Diagnosis not present

## 2014-08-09 DIAGNOSIS — H25811 Combined forms of age-related cataract, right eye: Secondary | ICD-10-CM | POA: Diagnosis not present

## 2014-08-16 DIAGNOSIS — H2511 Age-related nuclear cataract, right eye: Secondary | ICD-10-CM | POA: Diagnosis not present

## 2014-08-22 DIAGNOSIS — H2511 Age-related nuclear cataract, right eye: Secondary | ICD-10-CM | POA: Diagnosis not present

## 2014-08-22 DIAGNOSIS — H268 Other specified cataract: Secondary | ICD-10-CM | POA: Diagnosis not present

## 2014-08-22 DIAGNOSIS — H21561 Pupillary abnormality, right eye: Secondary | ICD-10-CM | POA: Diagnosis not present

## 2014-09-06 ENCOUNTER — Other Ambulatory Visit: Payer: Self-pay

## 2014-09-06 DIAGNOSIS — Z1231 Encounter for screening mammogram for malignant neoplasm of breast: Secondary | ICD-10-CM

## 2014-09-08 ENCOUNTER — Ambulatory Visit
Admission: RE | Admit: 2014-09-08 | Discharge: 2014-09-08 | Disposition: A | Discharge status: Home / Self Care | Payer: Medicare Other | Source: Ambulatory Visit

## 2014-09-08 DIAGNOSIS — Z1231 Encounter for screening mammogram for malignant neoplasm of breast: Secondary | ICD-10-CM | POA: Diagnosis not present

## 2014-09-16 DIAGNOSIS — F329 Major depressive disorder, single episode, unspecified: Secondary | ICD-10-CM | POA: Diagnosis not present

## 2014-09-20 DIAGNOSIS — R6884 Jaw pain: Secondary | ICD-10-CM | POA: Diagnosis not present

## 2014-09-20 DIAGNOSIS — J449 Chronic obstructive pulmonary disease, unspecified: Secondary | ICD-10-CM | POA: Diagnosis not present

## 2014-09-20 DIAGNOSIS — Z23 Encounter for immunization: Secondary | ICD-10-CM | POA: Diagnosis not present

## 2014-09-20 DIAGNOSIS — Z0000 Encounter for general adult medical examination without abnormal findings: Secondary | ICD-10-CM | POA: Diagnosis not present

## 2014-09-20 DIAGNOSIS — F17210 Nicotine dependence, cigarettes, uncomplicated: Secondary | ICD-10-CM | POA: Diagnosis not present

## 2014-09-20 DIAGNOSIS — I10 Essential (primary) hypertension: Secondary | ICD-10-CM | POA: Diagnosis not present

## 2014-09-20 DIAGNOSIS — M479 Spondylosis, unspecified: Secondary | ICD-10-CM | POA: Diagnosis not present

## 2014-09-20 DIAGNOSIS — F39 Unspecified mood [affective] disorder: Secondary | ICD-10-CM | POA: Diagnosis not present

## 2014-09-20 DIAGNOSIS — M2550 Pain in unspecified joint: Secondary | ICD-10-CM | POA: Diagnosis not present

## 2014-10-01 DIAGNOSIS — N61 Inflammatory disorders of breast: Secondary | ICD-10-CM | POA: Diagnosis not present

## 2014-10-20 DIAGNOSIS — H0231 Blepharochalasis right upper eyelid: Secondary | ICD-10-CM | POA: Diagnosis not present

## 2014-10-27 DIAGNOSIS — H0233 Blepharochalasis right eye, unspecified eyelid: Secondary | ICD-10-CM | POA: Diagnosis not present

## 2014-10-31 DIAGNOSIS — Z79899 Other long term (current) drug therapy: Secondary | ICD-10-CM | POA: Diagnosis not present

## 2014-11-29 DIAGNOSIS — M19041 Primary osteoarthritis, right hand: Secondary | ICD-10-CM | POA: Diagnosis not present

## 2014-11-29 DIAGNOSIS — M19071 Primary osteoarthritis, right ankle and foot: Secondary | ICD-10-CM | POA: Diagnosis not present

## 2014-11-29 DIAGNOSIS — M533 Sacrococcygeal disorders, not elsewhere classified: Secondary | ICD-10-CM | POA: Diagnosis not present

## 2014-11-29 DIAGNOSIS — M489 Spondylopathy, unspecified: Secondary | ICD-10-CM | POA: Diagnosis not present

## 2015-02-13 DIAGNOSIS — F329 Major depressive disorder, single episode, unspecified: Secondary | ICD-10-CM | POA: Diagnosis not present

## 2015-02-23 DIAGNOSIS — Z79899 Other long term (current) drug therapy: Secondary | ICD-10-CM | POA: Diagnosis not present

## 2015-03-24 DIAGNOSIS — I129 Hypertensive chronic kidney disease with stage 1 through stage 4 chronic kidney disease, or unspecified chronic kidney disease: Secondary | ICD-10-CM | POA: Diagnosis not present

## 2015-03-24 DIAGNOSIS — N183 Chronic kidney disease, stage 3 (moderate): Secondary | ICD-10-CM | POA: Diagnosis not present

## 2015-03-24 DIAGNOSIS — J449 Chronic obstructive pulmonary disease, unspecified: Secondary | ICD-10-CM | POA: Diagnosis not present

## 2015-04-03 DIAGNOSIS — Z79899 Other long term (current) drug therapy: Secondary | ICD-10-CM | POA: Diagnosis not present

## 2015-04-03 DIAGNOSIS — M1990 Unspecified osteoarthritis, unspecified site: Secondary | ICD-10-CM | POA: Diagnosis not present

## 2015-04-03 DIAGNOSIS — M489 Spondylopathy, unspecified: Secondary | ICD-10-CM | POA: Diagnosis not present

## 2015-04-03 DIAGNOSIS — M545 Low back pain: Secondary | ICD-10-CM | POA: Diagnosis not present

## 2015-05-15 DIAGNOSIS — H20011 Primary iridocyclitis, right eye: Secondary | ICD-10-CM | POA: Diagnosis not present

## 2015-05-15 DIAGNOSIS — Z961 Presence of intraocular lens: Secondary | ICD-10-CM | POA: Diagnosis not present

## 2015-05-30 DIAGNOSIS — H20011 Primary iridocyclitis, right eye: Secondary | ICD-10-CM | POA: Diagnosis not present

## 2015-05-30 DIAGNOSIS — Z961 Presence of intraocular lens: Secondary | ICD-10-CM | POA: Diagnosis not present

## 2015-06-30 DIAGNOSIS — Z79899 Other long term (current) drug therapy: Secondary | ICD-10-CM | POA: Diagnosis not present

## 2015-07-03 DIAGNOSIS — F329 Major depressive disorder, single episode, unspecified: Secondary | ICD-10-CM | POA: Diagnosis not present

## 2015-07-11 DIAGNOSIS — Z23 Encounter for immunization: Secondary | ICD-10-CM | POA: Diagnosis not present

## 2015-08-01 DIAGNOSIS — Z961 Presence of intraocular lens: Secondary | ICD-10-CM | POA: Diagnosis not present

## 2015-08-01 DIAGNOSIS — H20011 Primary iridocyclitis, right eye: Secondary | ICD-10-CM | POA: Diagnosis not present

## 2015-08-07 DIAGNOSIS — Z09 Encounter for follow-up examination after completed treatment for conditions other than malignant neoplasm: Secondary | ICD-10-CM | POA: Diagnosis not present

## 2015-08-07 DIAGNOSIS — M79641 Pain in right hand: Secondary | ICD-10-CM | POA: Diagnosis not present

## 2015-08-07 DIAGNOSIS — M489 Spondylopathy, unspecified: Secondary | ICD-10-CM | POA: Diagnosis not present

## 2015-08-07 DIAGNOSIS — M79642 Pain in left hand: Secondary | ICD-10-CM | POA: Diagnosis not present

## 2015-08-07 DIAGNOSIS — M79671 Pain in right foot: Secondary | ICD-10-CM | POA: Diagnosis not present

## 2015-08-07 DIAGNOSIS — M1990 Unspecified osteoarthritis, unspecified site: Secondary | ICD-10-CM | POA: Diagnosis not present

## 2015-08-07 DIAGNOSIS — M79672 Pain in left foot: Secondary | ICD-10-CM | POA: Diagnosis not present

## 2015-08-07 DIAGNOSIS — M545 Low back pain: Secondary | ICD-10-CM | POA: Diagnosis not present

## 2015-09-08 DIAGNOSIS — M489 Spondylopathy, unspecified: Secondary | ICD-10-CM | POA: Diagnosis not present

## 2015-09-08 DIAGNOSIS — R0781 Pleurodynia: Secondary | ICD-10-CM | POA: Diagnosis not present

## 2015-09-08 DIAGNOSIS — W1800XA Striking against unspecified object with subsequent fall, initial encounter: Secondary | ICD-10-CM | POA: Diagnosis not present

## 2015-09-08 DIAGNOSIS — M25562 Pain in left knee: Secondary | ICD-10-CM | POA: Diagnosis not present

## 2015-09-18 ENCOUNTER — Other Ambulatory Visit: Payer: Self-pay

## 2015-09-18 DIAGNOSIS — Z1231 Encounter for screening mammogram for malignant neoplasm of breast: Secondary | ICD-10-CM

## 2015-10-03 ENCOUNTER — Ambulatory Visit: Payer: Self-pay

## 2015-11-13 DIAGNOSIS — I129 Hypertensive chronic kidney disease with stage 1 through stage 4 chronic kidney disease, or unspecified chronic kidney disease: Secondary | ICD-10-CM | POA: Diagnosis not present

## 2015-11-13 DIAGNOSIS — N183 Chronic kidney disease, stage 3 (moderate): Secondary | ICD-10-CM | POA: Diagnosis not present

## 2015-11-13 DIAGNOSIS — F17210 Nicotine dependence, cigarettes, uncomplicated: Secondary | ICD-10-CM | POA: Diagnosis not present

## 2015-11-27 ENCOUNTER — Ambulatory Visit
Admission: RE | Admit: 2015-11-27 | Discharge: 2015-11-27 | Disposition: A | Discharge status: Home / Self Care | Payer: Medicare Other | Source: Ambulatory Visit

## 2015-11-27 DIAGNOSIS — Z961 Presence of intraocular lens: Secondary | ICD-10-CM | POA: Diagnosis not present

## 2015-11-27 DIAGNOSIS — H20021 Recurrent acute iridocyclitis, right eye: Secondary | ICD-10-CM | POA: Diagnosis not present

## 2015-11-27 DIAGNOSIS — Z1231 Encounter for screening mammogram for malignant neoplasm of breast: Secondary | ICD-10-CM

## 2015-11-28 DIAGNOSIS — F329 Major depressive disorder, single episode, unspecified: Secondary | ICD-10-CM | POA: Diagnosis not present

## 2015-12-04 DIAGNOSIS — Z961 Presence of intraocular lens: Secondary | ICD-10-CM | POA: Diagnosis not present

## 2015-12-04 DIAGNOSIS — H20021 Recurrent acute iridocyclitis, right eye: Secondary | ICD-10-CM | POA: Diagnosis not present

## 2015-12-12 DIAGNOSIS — H20021 Recurrent acute iridocyclitis, right eye: Secondary | ICD-10-CM | POA: Diagnosis not present

## 2015-12-12 DIAGNOSIS — Z961 Presence of intraocular lens: Secondary | ICD-10-CM | POA: Diagnosis not present

## 2015-12-26 DIAGNOSIS — Z79899 Other long term (current) drug therapy: Secondary | ICD-10-CM | POA: Diagnosis not present

## 2016-01-08 DIAGNOSIS — H209 Unspecified iridocyclitis: Secondary | ICD-10-CM | POA: Diagnosis not present

## 2016-01-08 DIAGNOSIS — M19241 Secondary osteoarthritis, right hand: Secondary | ICD-10-CM | POA: Diagnosis not present

## 2016-01-08 DIAGNOSIS — Z09 Encounter for follow-up examination after completed treatment for conditions other than malignant neoplasm: Secondary | ICD-10-CM | POA: Diagnosis not present

## 2016-01-09 DIAGNOSIS — H20021 Recurrent acute iridocyclitis, right eye: Secondary | ICD-10-CM | POA: Diagnosis not present

## 2016-01-09 DIAGNOSIS — Z961 Presence of intraocular lens: Secondary | ICD-10-CM | POA: Diagnosis not present

## 2016-01-15 DIAGNOSIS — Z961 Presence of intraocular lens: Secondary | ICD-10-CM | POA: Diagnosis not present

## 2016-01-15 DIAGNOSIS — H20021 Recurrent acute iridocyclitis, right eye: Secondary | ICD-10-CM | POA: Diagnosis not present

## 2016-02-09 DIAGNOSIS — H20021 Recurrent acute iridocyclitis, right eye: Secondary | ICD-10-CM | POA: Diagnosis not present

## 2016-02-09 DIAGNOSIS — Z961 Presence of intraocular lens: Secondary | ICD-10-CM | POA: Diagnosis not present

## 2016-02-19 DIAGNOSIS — F329 Major depressive disorder, single episode, unspecified: Secondary | ICD-10-CM | POA: Diagnosis not present

## 2016-02-26 DIAGNOSIS — M79669 Pain in unspecified lower leg: Secondary | ICD-10-CM | POA: Diagnosis not present

## 2016-02-26 DIAGNOSIS — M479 Spondylosis, unspecified: Secondary | ICD-10-CM | POA: Diagnosis not present

## 2016-02-26 DIAGNOSIS — L299 Pruritus, unspecified: Secondary | ICD-10-CM | POA: Diagnosis not present

## 2016-02-26 DIAGNOSIS — I8390 Asymptomatic varicose veins of unspecified lower extremity: Secondary | ICD-10-CM | POA: Diagnosis not present

## 2016-03-19 DIAGNOSIS — H01029 Squamous blepharitis unspecified eye, unspecified eyelid: Secondary | ICD-10-CM | POA: Diagnosis not present

## 2016-03-19 DIAGNOSIS — Z961 Presence of intraocular lens: Secondary | ICD-10-CM | POA: Diagnosis not present

## 2016-03-19 DIAGNOSIS — H20021 Recurrent acute iridocyclitis, right eye: Secondary | ICD-10-CM | POA: Diagnosis not present

## 2016-03-19 DIAGNOSIS — H04123 Dry eye syndrome of bilateral lacrimal glands: Secondary | ICD-10-CM | POA: Diagnosis not present

## 2016-04-10 ENCOUNTER — Emergency Department (HOSPITAL_COMMUNITY): Payer: Medicare Other

## 2016-04-10 ENCOUNTER — Encounter (HOSPITAL_COMMUNITY): Payer: Self-pay

## 2016-04-10 ENCOUNTER — Emergency Department (HOSPITAL_BASED_OUTPATIENT_CLINIC_OR_DEPARTMENT_OTHER)
Admit: 2016-04-10 | Discharge: 2016-04-10 | Disposition: A | Discharge status: Home / Self Care | Payer: Medicare Other | Attending: Emergency Medicine | Admitting: Emergency Medicine

## 2016-04-10 ENCOUNTER — Inpatient Hospital Stay (HOSPITAL_COMMUNITY)
Admission: EM | Admit: 2016-04-10 | Discharge: 2016-04-13 | DRG: 190 | Disposition: A | Discharge status: Home / Self Care | Payer: Medicare Other | Attending: Internal Medicine | Admitting: Internal Medicine

## 2016-04-10 DIAGNOSIS — Z888 Allergy status to other drugs, medicaments and biological substances status: Secondary | ICD-10-CM

## 2016-04-10 DIAGNOSIS — J441 Chronic obstructive pulmonary disease with (acute) exacerbation: Secondary | ICD-10-CM | POA: Diagnosis not present

## 2016-04-10 DIAGNOSIS — R062 Wheezing: Secondary | ICD-10-CM

## 2016-04-10 DIAGNOSIS — F32A Depression, unspecified: Secondary | ICD-10-CM

## 2016-04-10 DIAGNOSIS — F17200 Nicotine dependence, unspecified, uncomplicated: Secondary | ICD-10-CM | POA: Diagnosis not present

## 2016-04-10 DIAGNOSIS — I10 Essential (primary) hypertension: Secondary | ICD-10-CM | POA: Diagnosis present

## 2016-04-10 DIAGNOSIS — J208 Acute bronchitis due to other specified organisms: Secondary | ICD-10-CM

## 2016-04-10 DIAGNOSIS — Z8249 Family history of ischemic heart disease and other diseases of the circulatory system: Secondary | ICD-10-CM

## 2016-04-10 DIAGNOSIS — F419 Anxiety disorder, unspecified: Secondary | ICD-10-CM | POA: Diagnosis not present

## 2016-04-10 DIAGNOSIS — F329 Major depressive disorder, single episode, unspecified: Secondary | ICD-10-CM | POA: Diagnosis present

## 2016-04-10 DIAGNOSIS — Z87891 Personal history of nicotine dependence: Secondary | ICD-10-CM | POA: Diagnosis not present

## 2016-04-10 DIAGNOSIS — R609 Edema, unspecified: Secondary | ICD-10-CM | POA: Diagnosis not present

## 2016-04-10 DIAGNOSIS — Z79899 Other long term (current) drug therapy: Secondary | ICD-10-CM

## 2016-04-10 DIAGNOSIS — R0602 Shortness of breath: Secondary | ICD-10-CM | POA: Diagnosis not present

## 2016-04-10 DIAGNOSIS — R05 Cough: Secondary | ICD-10-CM

## 2016-04-10 DIAGNOSIS — Z7951 Long term (current) use of inhaled steroids: Secondary | ICD-10-CM

## 2016-04-10 DIAGNOSIS — J40 Bronchitis, not specified as acute or chronic: Secondary | ICD-10-CM

## 2016-04-10 DIAGNOSIS — R059 Cough, unspecified: Secondary | ICD-10-CM

## 2016-04-10 DIAGNOSIS — J9601 Acute respiratory failure with hypoxia: Secondary | ICD-10-CM | POA: Diagnosis not present

## 2016-04-10 DIAGNOSIS — J329 Chronic sinusitis, unspecified: Secondary | ICD-10-CM

## 2016-04-10 HISTORY — DX: Chronic obstructive pulmonary disease, unspecified: J44.9

## 2016-04-10 HISTORY — DX: Essential (primary) hypertension: I10

## 2016-04-10 HISTORY — DX: Nausea with vomiting, unspecified: R11.2

## 2016-04-10 HISTORY — DX: Low back pain, unspecified: M54.50

## 2016-04-10 HISTORY — DX: Other specified postprocedural states: Z98.890

## 2016-04-10 HISTORY — DX: Other chronic pain: G89.29

## 2016-04-10 HISTORY — DX: Low back pain: M54.5

## 2016-04-10 HISTORY — DX: Chronic kidney disease, stage 2 (mild): N18.2

## 2016-04-10 HISTORY — DX: Ankylosing spondylitis of unspecified sites in spine: M45.9

## 2016-04-10 LAB — CBC WITH DIFFERENTIAL/PLATELET
Basophils Absolute: 0 10*3/uL (ref 0.0–0.1)
Basophils Relative: 0 %
EOS PCT: 2 %
Eosinophils Absolute: 0.2 10*3/uL (ref 0.0–0.7)
HCT: 39.4 % (ref 36.0–46.0)
Hemoglobin: 13.6 g/dL (ref 12.0–15.0)
LYMPHS ABS: 2 10*3/uL (ref 0.7–4.0)
LYMPHS PCT: 19 %
MCH: 32.7 pg (ref 26.0–34.0)
MCHC: 34.5 g/dL (ref 30.0–36.0)
MCV: 94.7 fL (ref 78.0–100.0)
MONO ABS: 0.8 10*3/uL (ref 0.1–1.0)
MONOS PCT: 8 %
Neutro Abs: 7.9 10*3/uL — ABNORMAL HIGH (ref 1.7–7.7)
Neutrophils Relative %: 71 %
PLATELETS: 209 10*3/uL (ref 150–400)
RBC: 4.16 MIL/uL (ref 3.87–5.11)
RDW: 13.1 % (ref 11.5–15.5)
WBC: 11 10*3/uL — ABNORMAL HIGH (ref 4.0–10.5)

## 2016-04-10 LAB — BASIC METABOLIC PANEL
Anion gap: 9 (ref 5–15)
BUN: 8 mg/dL (ref 6–20)
CALCIUM: 9.5 mg/dL (ref 8.9–10.3)
CO2: 24 mmol/L (ref 22–32)
Chloride: 105 mmol/L (ref 101–111)
Creatinine, Ser: 0.91 mg/dL (ref 0.44–1.00)
GFR calc Af Amer: >60 mL/min (ref >60)
GLUCOSE: 121 mg/dL — AB (ref 65–99)
Potassium: 3.9 mmol/L (ref 3.5–5.1)
Sodium: 138 mmol/L (ref 135–145)

## 2016-04-10 LAB — D-DIMER, QUANTITATIVE: D-Dimer, Quant: 0.28 ug/mL-FEU (ref 0.00–0.50)

## 2016-04-10 LAB — TROPONIN I: Troponin I: <0.03 ng/mL (ref <0.03)

## 2016-04-10 LAB — BRAIN NATRIURETIC PEPTIDE: B Natriuretic Peptide: 30.4 pg/mL (ref 0.0–100.0)

## 2016-04-10 MED ORDER — MAGNESIUM CITRATE PO SOLN: 1.0000 | Freq: Once | ORAL | Status: DC | PRN

## 2016-04-10 MED ORDER — LOSARTAN POTASSIUM 50 MG PO TABS
100.0000 mg | ORAL_TABLET | Freq: Every day | ORAL | Status: DC
  Administered 2016-04-10 – 2016-04-13 (×4): 100 mg via ORAL
  Filled 2016-04-10 (×4): qty 2

## 2016-04-10 MED ORDER — ACETAMINOPHEN 650 MG RE SUPP: 650.0000 mg | Freq: Four times a day (QID) | RECTAL | Status: DC | PRN

## 2016-04-10 MED ORDER — IPRATROPIUM-ALBUTEROL 0.5-2.5 (3) MG/3ML IN SOLN: 3.0000 mL | Freq: Four times a day (QID) | RESPIRATORY_TRACT | Status: DC

## 2016-04-10 MED ORDER — GUAIFENESIN ER 600 MG PO TB12
600.0000 mg | ORAL_TABLET | Freq: Two times a day (BID) | ORAL | Status: DC
  Administered 2016-04-10 – 2016-04-13 (×6): 600 mg via ORAL
  Filled 2016-04-10 (×6): qty 1

## 2016-04-10 MED ORDER — HYDROCODONE-ACETAMINOPHEN 5-325 MG PO TABS: 1.0000 | ORAL_TABLET | ORAL | Status: DC | PRN

## 2016-04-10 MED ORDER — LEVOFLOXACIN IN D5W 500 MG/100ML IV SOLN
500.0000 mg | Freq: Once | INTRAVENOUS | Status: AC
  Administered 2016-04-10: 500 mg via INTRAVENOUS
  Filled 2016-04-10: qty 100

## 2016-04-10 MED ORDER — ENOXAPARIN SODIUM 40 MG/0.4ML ~~LOC~~ SOLN
40.0000 mg | SUBCUTANEOUS | Status: DC
  Administered 2016-04-10 – 2016-04-12 (×3): 40 mg via SUBCUTANEOUS
  Filled 2016-04-10 (×4): qty 0.4

## 2016-04-10 MED ORDER — METHYLPREDNISOLONE SODIUM SUCC 125 MG IJ SOLR
125.0000 mg | Freq: Once | INTRAMUSCULAR | Status: AC
  Administered 2016-04-10: 125 mg via INTRAVENOUS
  Filled 2016-04-10: qty 2

## 2016-04-10 MED ORDER — FAMOTIDINE IN NACL 20-0.9 MG/50ML-% IV SOLN
20.0000 mg | Freq: Two times a day (BID) | INTRAVENOUS | Status: DC
  Administered 2016-04-10 – 2016-04-11 (×2): 20 mg via INTRAVENOUS
  Filled 2016-04-10 (×3): qty 50

## 2016-04-10 MED ORDER — BISACODYL 10 MG RE SUPP: 10.0000 mg | Freq: Every day | RECTAL | Status: DC | PRN

## 2016-04-10 MED ORDER — CLONAZEPAM 1 MG PO TABS
1.5000 mg | ORAL_TABLET | Freq: Every day | ORAL | Status: DC
  Administered 2016-04-10 – 2016-04-13 (×4): 1.5 mg via ORAL
  Filled 2016-04-10 (×3): qty 1
  Filled 2016-04-10: qty 3

## 2016-04-10 MED ORDER — ACETAMINOPHEN 325 MG PO TABS: 650.0000 mg | ORAL_TABLET | Freq: Four times a day (QID) | ORAL | Status: DC | PRN

## 2016-04-10 MED ORDER — METHYLPREDNISOLONE SODIUM SUCC 125 MG IJ SOLR
60.0000 mg | Freq: Every day | INTRAMUSCULAR | Status: DC
  Administered 2016-04-11: 60 mg via INTRAVENOUS
  Filled 2016-04-10: qty 2

## 2016-04-10 MED ORDER — LEVOFLOXACIN IN D5W 750 MG/150ML IV SOLN
750.0000 mg | INTRAVENOUS | Status: DC
Start: 2016-04-11 — End: 2016-04-13
  Administered 2016-04-11 – 2016-04-12 (×2): 750 mg via INTRAVENOUS
  Filled 2016-04-10 (×3): qty 150

## 2016-04-10 MED ORDER — ONDANSETRON HCL 4 MG/2ML IJ SOLN: 4.0000 mg | Freq: Four times a day (QID) | INTRAMUSCULAR | Status: DC | PRN

## 2016-04-10 MED ORDER — IPRATROPIUM-ALBUTEROL 0.5-2.5 (3) MG/3ML IN SOLN
3.0000 mL | Freq: Three times a day (TID) | RESPIRATORY_TRACT | Status: DC
  Administered 2016-04-11 – 2016-04-13 (×7): 3 mL via RESPIRATORY_TRACT
  Filled 2016-04-10 (×6): qty 3

## 2016-04-10 MED ORDER — SENNOSIDES-DOCUSATE SODIUM 8.6-50 MG PO TABS
1.0000 | ORAL_TABLET | Freq: Every evening | ORAL | Status: DC | PRN
  Administered 2016-04-12: 1 via ORAL
  Filled 2016-04-10: qty 1

## 2016-04-10 MED ORDER — NICOTINE 7 MG/24HR TD PT24
7.0000 mg | MEDICATED_PATCH | Freq: Every day | TRANSDERMAL | Status: DC
  Administered 2016-04-10 – 2016-04-13 (×4): 7 mg via TRANSDERMAL
  Filled 2016-04-10 (×4): qty 1

## 2016-04-10 MED ORDER — HYDROXYZINE HCL 25 MG PO TABS
25.0000 mg | ORAL_TABLET | Freq: Three times a day (TID) | ORAL | Status: DC | PRN
Start: 2016-04-10 — End: 2016-04-13

## 2016-04-10 MED ORDER — IPRATROPIUM-ALBUTEROL 0.5-2.5 (3) MG/3ML IN SOLN
3.0000 mL | Freq: Once | RESPIRATORY_TRACT | Status: AC
  Administered 2016-04-10: 3 mL via RESPIRATORY_TRACT
  Filled 2016-04-10: qty 3

## 2016-04-10 MED ORDER — IPRATROPIUM-ALBUTEROL 0.5-2.5 (3) MG/3ML IN SOLN
3.0000 mL | RESPIRATORY_TRACT | Status: DC
  Administered 2016-04-10 (×2): 3 mL via RESPIRATORY_TRACT
  Filled 2016-04-10 (×2): qty 3

## 2016-04-10 MED ORDER — ONDANSETRON HCL 4 MG PO TABS: 4.0000 mg | ORAL_TABLET | Freq: Four times a day (QID) | ORAL | Status: DC | PRN

## 2016-04-10 MED ORDER — ALBUTEROL SULFATE (2.5 MG/3ML) 0.083% IN NEBU: 2.5000 mg | INHALATION_SOLUTION | RESPIRATORY_TRACT | Status: DC | PRN

## 2016-04-10 NOTE — ED Triage Notes (Signed)
URI has been going around the family and the pt. Has had a cough and a fever for the past few days with some shortness of breath. Lung sounds are clear but strong cough present

## 2016-04-10 NOTE — Progress Notes (Signed)
*  PRELIMINARY RESULTS* Vascular Ultrasound Left lower extremity venous duplex has been completed.  Preliminary findings: No evidence of DVT, superficial thrombosis, or baker's cyst.    Landry Mellow, RDMS, RVT  04/10/2016, 11:37 AM

## 2016-04-10 NOTE — ED Provider Notes (Signed)
[facility] DEPT Provider Note   CSN: [id number] Arrival date & time: 04/10/16  [id number]     History   Chief Complaint Chief Complaint  Patient presents with  . Shortness of Breath    HPI Kirsten Harper is a 70 y.o. female.  Patient with history of COPD, current smoker presenting with 4 day history of shortness of breath and cough. Cough is productive of yellow and green mucus. She's been visiting her mother who is sick in the hospital with pneumonia. She reports fever to 101 last night. Denies any nausea vomiting or diarrhea. Denies any chest pain. Has coughed so hard that she has has some incontinence. She denies any focal weakness, numbness or tingling. She states her COPD is usually well-controlled and she does not use inhalers on a regular basis. She did not receive a flu shot. She also complains of some swelling in her left leg that she's had for several months. No history of heart failure or CAD.   The history is provided by the patient.  Shortness of Breath  Associated symptoms include a fever, rhinorrhea, cough and leg swelling. Pertinent negatives include no chest pain, no vomiting and no abdominal pain.    Past Medical History:  Diagnosis Date  . Anxiety   . Arthritis   . Cataract   . Depression     There are no active problems to display for this patient.   Past Surgical History:  Procedure Laterality Date  . ABDOMINAL HYSTERECTOMY    . EYE SURGERY    . TUBAL LIGATION      OB History    No data available       Home Medications    Prior to Admission medications   Medication Sig Start Date End Date Taking? Authorizing Provider  albuterol (PROVENTIL HFA;VENTOLIN HFA) 108 (90 BASE) MCG/ACT inhaler Inhale 2 puffs into the lungs every 4 (four) hours as needed for wheezing or shortness of breath (cough, shortness of breath or wheezing.). 07/20/14   Posey Boyer, MD  albuterol (PROVENTIL) (2.5 MG/3ML) 0.083% nebulizer solution Take 2.5 mg by nebulization every  6 (six) hours as needed.    Historical Provider, MD  amitriptyline (ELAVIL) 75 MG tablet Take 150 mg by mouth at bedtime.     Historical Provider, MD  benzonatate (TESSALON) 100 MG capsule Take 1-2 capsules (100-200 mg total) by mouth 3 (three) times daily as needed. 07/20/14   Posey Boyer, MD  fluticasone (FLONASE) 50 MCG/ACT nasal spray Place 2 sprays into the nose daily. 01/24/12 01/23/13  Robyn Haber, MD  HYDROcodone-homatropine [facility]) 5-1.5 MG/5ML syrup Take 5 mLs by mouth every 4 (four) hours as needed. 07/20/14   Posey Boyer, MD  indomethacin (INDOCIN SR) 75 MG CR capsule Take 75 mg by mouth 2 (two) times daily with a meal.    Historical Provider, MD  losartan (COZAAR) 100 MG tablet Take 100 mg by mouth daily.    Historical Provider, MD  predniSONE (DELTASONE) 20 MG tablet Take 1 tablet (20 mg total) by mouth daily with breakfast. 07/20/14   Posey Boyer, MD    Family History Family History  Problem Relation Age of Onset  . Hypertension Mother     Social History Social History  Substance Use Topics  . Smoking status: Former Research scientist (life sciences)  . Smokeless tobacco: Never Used  . Alcohol use 0.0 oz/week     Comment: social     Allergies   Amoxapine and related   Review of  Systems Review of Systems  Constitutional: Positive for fatigue and fever. Negative for activity change and appetite change.  HENT: Positive for congestion and rhinorrhea.   Eyes: Negative for visual disturbance.  Respiratory: Positive for cough and shortness of breath. Negative for chest tightness.   Cardiovascular: Positive for leg swelling. Negative for chest pain.  Gastrointestinal: Negative for abdominal pain, nausea and vomiting.  Endocrine: Negative for polyuria.  Genitourinary: Negative for dysuria, hematuria, vaginal bleeding and vaginal discharge.  Musculoskeletal: Negative for arthralgias and myalgias.  Neurological: Negative for dizziness, weakness and numbness.  .A complete 10 system review of  systems was obtained and all systems are negative except as noted in the HPI and PMH.    Physical Exam Updated Vital Signs There were no vitals taken for this visit.  Physical Exam  Constitutional: She is oriented to person, place, and time. She appears well-developed and well-nourished. No distress.  HENT:  Head: Normocephalic and atraumatic.  Mouth/Throat: Oropharynx is clear and moist. No oropharyngeal exudate.  Eyes: Conjunctivae and EOM are normal. Pupils are equal, round, and reactive to light.  Neck: Normal range of motion. Neck supple.  No meningismus.  Cardiovascular: Normal rate, regular rhythm, normal heart sounds and intact distal pulses.  Exam reveals no gallop.   No murmur heard. Pulmonary/Chest: Effort normal and breath sounds normal. No respiratory distress.  Coarse rhonchi, good air exchange  Abdominal: Soft. There is no tenderness. There is no rebound and no guarding.  Musculoskeletal: Normal range of motion. She exhibits edema. She exhibits no tenderness.  Edema and tenderness to L calf. Intact distal pulses  Neurological: She is alert and oriented to person, place, and time. No cranial nerve deficit. She exhibits normal muscle tone. Coordination normal.  No ataxia on finger to nose bilaterally. No pronator drift. 5/5 strength throughout. CN 2-12 intact.Equal grip strength. Sensation intact.   Skin: Skin is warm.  Psychiatric: She has a normal mood and affect. Her behavior is normal.  Nursing note and vitals reviewed.    ED Treatments / Results  Labs (all labs ordered are listed, but only abnormal results are displayed) Labs Reviewed  CBC WITH DIFFERENTIAL/PLATELET - Abnormal; Notable for the following:       Result Value   WBC 11.0 (*)    Neutro Abs 7.9 (*)    All other components within normal limits  BASIC METABOLIC PANEL - Abnormal; Notable for the following:    Glucose, Bld 121 (*)    All other components within normal limits  CULTURE, EXPECTORATED  SPUTUM-ASSESSMENT  CULTURE, BLOOD (ROUTINE X 2)  CULTURE, BLOOD (ROUTINE X 2)  TROPONIN I  D-DIMER, QUANTITATIVE (NOT AT [facility])  BRAIN NATRIURETIC PEPTIDE  COMPREHENSIVE METABOLIC PANEL  CBC    EKG  EKG Interpretation  Date/Time:  Wednesday April 10 2016 10:48:17 EDT Ventricular Rate:  79 PR Interval:    QRS Duration: 85 QT Interval:  401 QTC Calculation: 460 R Axis:   72 Text Interpretation:  Sinus rhythm RSR' in V1 or V2, right VCD or RVH No previous ECGs available Confirmed by Wyvonnia Dusky  MD, Zaidyn Claire [phone]) on 04/10/2016 10:57:06 AM       Radiology Dg Chest 2 View  Result Date: 04/10/2016 CLINICAL DATA:  Productive cough, congestion with fever EXAM: CHEST  2 VIEW COMPARISON:  07/20/2014 FINDINGS: The heart size and mediastinal contours are within normal limits. Both lungs are clear. The visualized skeletal structures are unremarkable. IMPRESSION: No active cardiopulmonary disease. Electronically Signed   By: Elbert Ewings  Patel   On: 04/10/2016 11:23    Procedures Procedures (including critical care time)  Medications Ordered in ED Medications  ipratropium-albuterol (DUONEB) 0.5-2.5 (3) MG/3ML nebulizer solution 3 mL (not administered)     Initial Impression / Assessment and Plan / ED Course  I have reviewed the triage vital signs and the nursing notes.  Pertinent labs & imaging results that were available during my care of the patient were reviewed by me and considered in my medical decision making (see chart for details).  Clinical Course  4 days of cough and shortness of breath. No chest pain. Good air exchange on exam without wheezing.  Chest x-ray negative for pneumonia. Patient given nebulizers and arrives. She is moving good air has rhonchi without discrete wheezing. EKG normal sinus rhythm.  Patient desaturations into the 80s both at rest and with exertion. She does not wear oxygen at home. D-dimer negative. LE doppler negative.  PLan admission for new O2  requirement.  Levaquin given.  D/w PA Wertman.  Final Clinical Impressions(s) / ED Diagnoses   Final diagnoses:  Bronchitis    New Prescriptions New Prescriptions   No medications on file     Ezequiel Essex, MD 04/10/16 2012

## 2016-04-10 NOTE — ED Notes (Signed)
Ambulated pt. While checking O2  O2 sats dipped to 88%

## 2016-04-10 NOTE — H&P (Signed)
History and Physical    SATIVA DRILLING [id number] DOB: 07-12-1946 DOA: 04/10/2016   PCP: Osborne Casco, MD   Patient coming from:  Home  Chief Complaint: Shortness of breath and cough   HPI: Kirsten Harper is a 70 y.o. female with medical history significant for COPD, current smoker, not on O2 or steroids at home, presenting with 4 day history of progressive cough with productive yellow green sputum. Symptoms began after visiting her mother in hospital with pneumonia. She reported fever up to 101 last night. She denies chills or night sweats. She denies any recent trips. She continues to smoke.  She reported shortness of breath on presentation, due to coughing spells. She has been compliant with her inhalers at home. She denies any hemoptysis. She denies any nausea  Or vomiting, No diarrhea. She reported "leg swelling"  At the ED she was desating, with O2 levels in the 80's, improved with 2 L [place]. She also received nebs and IV solumedrol with improvement of symptoms. Last recorded bronchitic episode in Jan 2016 and was felt to be viral  ED Course:  BP 126/67 (BP Location: Right Arm)   Pulse 85   Resp 23   SpO2 99%     Osats on admission in the 80s in [place], reqO2  currently in the 90's .  Received Duoneb x2, Solumedrol 125 mg x1 and Levaquin IV x1. Afebrile (Tmx at home 101, normal here)   WBC 11, CXR NAD Dopplers negative  Review of Systems: As per HPI otherwise 10 point review of systems negative.   Past Medical History:  Diagnosis Date  . Anxiety   . Arthritis   . Cataract   . Depression     Past Surgical History:  Procedure Laterality Date  . ABDOMINAL HYSTERECTOMY    . EYE SURGERY    . TUBAL LIGATION      Social History Social History   Social History  . Marital status: Divorced    Spouse name: N/A  . Number of children: N/A  . Years of education: N/A   Occupational History  . Not on file.   Social History Main Topics  . Smoking status: Former Research scientist (life sciences)    . Smokeless tobacco: Never Used  . Alcohol use 0.0 oz/week     Comment: social  . Drug use: Unknown  . Sexual activity: Not on file   Other Topics Concern  . Not on file   Social History Narrative  . No narrative on file     Allergies  Allergen Reactions  . Amoxapine And Related     Family History  Problem Relation Age of Onset  . Hypertension Mother       Prior to Admission medications   Medication Sig Start Date End Date Taking? Authorizing Provider  albuterol (PROVENTIL HFA;VENTOLIN HFA) 108 (90 BASE) MCG/ACT inhaler Inhale 2 puffs into the lungs every 4 (four) hours as needed for wheezing or shortness of breath (cough, shortness of breath or wheezing.). 07/20/14  Yes Posey Boyer, MD  clonazePAM (KLONOPIN) 1 MG tablet Take 1.5 mg by mouth daily.   Yes Historical Provider, MD  hydrOXYzine (ATARAX/VISTARIL) 25 MG tablet Take 25 mg by mouth 3 (three) times daily as needed for itching.   Yes Historical Provider, MD  losartan (COZAAR) 100 MG tablet Take 100 mg by mouth daily.   Yes Historical Provider, MD  sulfaSALAzine (AZULFIDINE) 500 MG tablet Take 500 mg by mouth daily as needed (arthritis).   Yes Historical  Provider, MD  benzonatate (TESSALON) 100 MG capsule Take 1-2 capsules (100-200 mg total) by mouth 3 (three) times daily as needed. Patient not taking: Reported on 04/10/2016 07/20/14   Posey Boyer, MD  fluticasone [facility]) 50 MCG/ACT nasal spray Place 2 sprays into the nose daily. 01/24/12 01/23/13  Robyn Haber, MD  HYDROcodone-homatropine [facility]) 5-1.5 MG/5ML syrup Take 5 mLs by mouth every 4 (four) hours as needed. Patient not taking: Reported on 04/10/2016 07/20/14   Posey Boyer, MD  predniSONE (DELTASONE) 20 MG tablet Take 1 tablet (20 mg total) by mouth daily with breakfast. Patient not taking: Reported on 04/10/2016 07/20/14   Posey Boyer, MD    Physical Exam:    Vitals:   04/10/16 1155  BP: 126/67  Pulse: 85  Resp: 23  SpO2: 99%        Constitutional: NAD,uncomfortable due to cough Vitals:   04/10/16 1155  BP: 126/67  Pulse: 85  Resp: 23  SpO2: 99%   Eyes: PERRL, lids and conjunctivae normal ENMT: Mucous membranes are moist. Posterior pharynx clear of any exudate or lesions.Normal dentition.  Neck: normal, supple, no masses, no thyromegaly Respiratory: bilateral rhonchi and  Mild wheezing, no crackles mild  respiratory effort. No accessory muscle use.  Cardiovascular: Regular rate and rhythm, no murmurs / rubs / gallops. No extremity edema. 2+ pedal pulses. No carotid bruits.  Abdomen: no tenderness, no masses palpated. No hepatosplenomegaly. Bowel sounds positive.  Musculoskeletal: no clubbing / cyanosis. No joint deformity upper and lower extremities. Good ROM, no contractures. Normal muscle tone.  Skin: no rashes, lesions, ulcers.  Neurologic: CN 2-12 grossly intact. Sensation intact, DTR normal. Strength 5/5 in all 4.  Psychiatric: Normal judgment and insight. Alert and oriented x 3. Normal mood.     Labs on Admission: I have personally reviewed following labs and imaging studies  CBC:  Recent Labs Lab 04/10/16 1028  WBC 11.0*  NEUTROABS 7.9*  HGB 13.6  HCT 39.4  MCV 94.7  PLT [id number]    Basic Metabolic Panel:  Recent Labs Lab 04/10/16 1028  NA 138  K 3.9  CL 105  CO2 24  GLUCOSE 121*  BUN 8  CREATININE 0.91  CALCIUM 9.5    GFR: CrCl cannot be calculated (Unknown ideal weight.).  Liver Function Tests: No results for input(s): AST, ALT, ALKPHOS, BILITOT, PROT, ALBUMIN in the last 168 hours. No results for input(s): LIPASE, AMYLASE in the last 168 hours. No results for input(s): AMMONIA in the last 168 hours.  Coagulation Profile: No results for input(s): INR, PROTIME in the last 168 hours.  Cardiac Enzymes:  Recent Labs Lab 04/10/16 1028  TROPONINI <0.03    BNP (last 3 results) No results for input(s): PROBNP in the last 8760 hours.  HbA1C: No results for  input(s): HGBA1C in the last 72 hours.  CBG: No results for input(s): GLUCAP in the last 168 hours.  Lipid Profile: No results for input(s): CHOL, HDL, LDLCALC, TRIG, CHOLHDL, LDLDIRECT in the last 72 hours.  Thyroid Function Tests: No results for input(s): TSH, T4TOTAL, FREET4, T3FREE, THYROIDAB in the last 72 hours.  Anemia Panel: No results for input(s): VITAMINB12, FOLATE, FERRITIN, TIBC, IRON, RETICCTPCT in the last 72 hours.  Urine analysis: No results found for: COLORURINE, APPEARANCEUR, LABSPEC, PHURINE, GLUCOSEU, HGBUR, BILIRUBINUR, KETONESUR, PROTEINUR, UROBILINOGEN, NITRITE, LEUKOCYTESUR  Sepsis Labs: @LABRCNTIP (procalcitonin:4,lacticidven:4) )No results found for this or any previous visit (from the past 240 hour(s)).   Radiological Exams on Admission: Dg Chest 2  View  Result Date: 04/10/2016 CLINICAL DATA:  Productive cough, congestion with fever EXAM: CHEST  2 VIEW COMPARISON:  07/20/2014 FINDINGS: The heart size and mediastinal contours are within normal limits. Both lungs are clear. The visualized skeletal structures are unremarkable. IMPRESSION: No active cardiopulmonary disease. Electronically Signed   By: Kathreen Devoid   On: 04/10/2016 11:23    EKG: Independently reviewed.  Assessment/Plan Active Problems:   Acute respiratory failure with hypoxia (HCC)   COPD exacerbation (HCC)   Anxiety   Depression   Acute hypoxic respiratory failure with hypoxia likely due to COPD exacerbation, with bronchitic features versus early upper respiratory infection due to sick contact   Osats on admission in the 80s in [place], reqO2 [place] currently in the 90's . Received Duoneb x2, Solumedrol 125 mg x1 and Levaquin IV x1. Afebrile (Tmx at home 101, normal here)  WBC 11, CXR NAD. Lower extremity Dopplers neg  Given productive cough, decreased oxygen saturation on [place]  Admit to medsurg obs Solumedrol 60 mg IV, with prednisone taper at discharge  -Duoneb q4h with albuterol q2h prn Continue  Levaquin   PPI therapy Continue  oxygen as tolerated Blood, sputum cultures  Mucinex as needed  Repeat CBC in am Tobacco Cessation program Nicotine patch   Anxiety  Continue meds with Klonopin   DVT prophylaxis: Lovenox   Code Status:   Full     Family Communication:  Discussed with patient  Disposition Plan: Expect patient to be discharged to home after condition improves Consults called:    None Admission status Medsurg Obs    Chere Babson E, PA-C [facility]   If 7PM-7AM, please contact night-coverage [email or web address] Password [email or web address]  04/10/2016, 1:34 PM

## 2016-04-11 DIAGNOSIS — I10 Essential (primary) hypertension: Secondary | ICD-10-CM

## 2016-04-11 DIAGNOSIS — F17200 Nicotine dependence, unspecified, uncomplicated: Secondary | ICD-10-CM | POA: Diagnosis present

## 2016-04-11 DIAGNOSIS — J441 Chronic obstructive pulmonary disease with (acute) exacerbation: Secondary | ICD-10-CM | POA: Diagnosis not present

## 2016-04-11 DIAGNOSIS — J9601 Acute respiratory failure with hypoxia: Secondary | ICD-10-CM | POA: Diagnosis not present

## 2016-04-11 DIAGNOSIS — F329 Major depressive disorder, single episode, unspecified: Secondary | ICD-10-CM | POA: Diagnosis present

## 2016-04-11 DIAGNOSIS — Z7951 Long term (current) use of inhaled steroids: Secondary | ICD-10-CM | POA: Diagnosis not present

## 2016-04-11 DIAGNOSIS — Z888 Allergy status to other drugs, medicaments and biological substances status: Secondary | ICD-10-CM | POA: Diagnosis not present

## 2016-04-11 DIAGNOSIS — F419 Anxiety disorder, unspecified: Secondary | ICD-10-CM | POA: Diagnosis present

## 2016-04-11 DIAGNOSIS — R0602 Shortness of breath: Secondary | ICD-10-CM | POA: Diagnosis not present

## 2016-04-11 DIAGNOSIS — Z79899 Other long term (current) drug therapy: Secondary | ICD-10-CM | POA: Diagnosis not present

## 2016-04-11 DIAGNOSIS — Z8249 Family history of ischemic heart disease and other diseases of the circulatory system: Secondary | ICD-10-CM | POA: Diagnosis not present

## 2016-04-11 LAB — COMPREHENSIVE METABOLIC PANEL
ALK PHOS: 76 U/L (ref 38–126)
ALT: 15 U/L (ref 14–54)
ANION GAP: 7 (ref 5–15)
AST: 15 U/L (ref 15–41)
Albumin: 3.7 g/dL (ref 3.5–5.0)
BILIRUBIN TOTAL: 0.8 mg/dL (ref 0.3–1.2)
BUN: 12 mg/dL (ref 6–20)
CALCIUM: 9.5 mg/dL (ref 8.9–10.3)
CO2: 25 mmol/L (ref 22–32)
Chloride: 107 mmol/L (ref 101–111)
Creatinine, Ser: 1.02 mg/dL — ABNORMAL HIGH (ref 0.44–1.00)
GFR calc Af Amer: >60 mL/min (ref >60)
GFR calc non Af Amer: 55 mL/min — ABNORMAL LOW (ref >60)
GLUCOSE: 237 mg/dL — AB (ref 65–99)
Potassium: 4.8 mmol/L (ref 3.5–5.1)
SODIUM: 139 mmol/L (ref 135–145)
TOTAL PROTEIN: 6.8 g/dL (ref 6.5–8.1)

## 2016-04-11 LAB — CBC
HEMATOCRIT: 37.9 % (ref 36.0–46.0)
HEMOGLOBIN: 12.7 g/dL (ref 12.0–15.0)
MCH: 32 pg (ref 26.0–34.0)
MCHC: 33.5 g/dL (ref 30.0–36.0)
MCV: 95.5 fL (ref 78.0–100.0)
Platelets: 221 10*3/uL (ref 150–400)
RBC: 3.97 MIL/uL (ref 3.87–5.11)
RDW: 13 % (ref 11.5–15.5)
WBC: 7.9 10*3/uL (ref 4.0–10.5)

## 2016-04-11 MED ORDER — ROPINIROLE HCL 0.25 MG PO TABS
0.2500 mg | ORAL_TABLET | Freq: Every day | ORAL | Status: DC
  Administered 2016-04-11 – 2016-04-12 (×2): 0.25 mg via ORAL
  Filled 2016-04-11 (×2): qty 1

## 2016-04-11 MED ORDER — BENZONATATE 100 MG PO CAPS
100.0000 mg | ORAL_CAPSULE | Freq: Three times a day (TID) | ORAL | Status: DC | PRN
  Administered 2016-04-11: 100 mg via ORAL
  Filled 2016-04-11: qty 1

## 2016-04-11 MED ORDER — METHYLPREDNISOLONE SODIUM SUCC 125 MG IJ SOLR
60.0000 mg | Freq: Three times a day (TID) | INTRAMUSCULAR | Status: DC
  Administered 2016-04-11 – 2016-04-13 (×7): 60 mg via INTRAVENOUS
  Filled 2016-04-11 (×6): qty 2

## 2016-04-11 NOTE — Progress Notes (Signed)
PROGRESS NOTE    DAJAI WOLINSKI  [id number] DOB: 1945-08-07 DOA: 04/10/2016 PCP: Osborne Casco, MD   Brief Narrative:  Kirsten Harper is a pleasant 70 year old female with a past medical history of chronic obstructive pulmonary disease, admitted to the medicine service on 04/10/2016 when she presented with complaints of cough associate with shortness of breath and wheezing. Symptoms felt to be secondary to COPD exacerbation and was started on IV steroids, nebulizer treatments and empiric antibiotic therapy. Chest x-ray did not reveal acute infiltrate.   Assessment & Plan:   Active Problems:   Acute respiratory failure with hypoxia (HCC)   COPD exacerbation (HCC)   Anxiety   Depression  1.  Acute hypoxemic respiratory failure -Evidence by an O2 sat of 83% on room air, likely caused by COPD exacerbation -Plan to continue treatment with IV steroids, nebulizer treatments, antibiotic therapy  2.  COPD exacerbation -On exam continues to have diminished breath sounds bilaterally with associated expiratory wheezes -Suspect underlying viral or bacterial infection precipitating COPD exacerbation as she reported having sick contacts at home -She is on centimeter 60 mg IV every 8 hours along with scheduled nebulizer treatments and Levaquin 500 mg every 24 hours  3.  History of tobacco abuse -Unfortunately she continues to smoke, she was extensively counseled on tobacco cessation  4.  Hypertension -Blood pressures controlled with last blood pressure 08/07/1956 -Continue losartan 100 mg by mouth daily   DVT prophylaxis: Lovenox Code Status: Full code Family Communication:  Disposition Plan: Continue steroids, nebs treatments, antibiotic therapy  Antimicrobials:   Levaquin    Subjective: She reports ongoing shortness of breath, cough, wheezing  Objective: Vitals:   04/11/16 0638 04/11/16 0819 04/11/16 1441 04/11/16 1456  BP: 123/69  (!) 124/58   Pulse: 82 84 89 89  Resp:  18 18 17 18   Temp: 98.1 F (36.7 C)  98.2 F (36.8 C)   TempSrc: Oral  Oral   SpO2: 98% 98% 95% 97%    Intake/Output Summary (Last 24 hours) at 04/11/16 1543 Last data filed at 04/11/16 [id number]  Gross per 24 hour  Intake              150 ml  Output                0 ml  Net              150 ml   There were no vitals filed for this visit.  Examination:  General exam: Chronically ill-appearing, on supplemental oxygen Respiratory system: Diminished breath sounds bilaterally with diffuse expiratory wheezes Cardiovascular system: S1 & S2 heard, RRR. No JVD, murmurs, rubs, gallops or clicks. No pedal edema. Gastrointestinal system: Abdomen is nondistended, soft and nontender. No organomegaly or masses felt. Normal bowel sounds heard. Central nervous system: Alert and oriented. No focal neurological deficits. Extremities: Symmetric 5 x 5 power. Skin: No rashes, lesions or ulcers Psychiatry: Judgement and insight appear normal. Mood & affect appropriate.     Data Reviewed: I have personally reviewed following labs and imaging studies  CBC:  Recent Labs Lab 04/10/16 1028 04/11/16 0509  WBC 11.0* 7.9  NEUTROABS 7.9*  --   HGB 13.6 12.7  HCT 39.4 37.9  MCV 94.7 95.5  PLT 209 [id number]   Basic Metabolic Panel:  Recent Labs Lab 04/10/16 1028 04/11/16 0509  NA 138 139  K 3.9 4.8  CL 105 107  CO2 24 25  GLUCOSE 121* 237*  BUN 8 12  CREATININE 0.91  1.02*  CALCIUM 9.5 9.5   GFR: CrCl cannot be calculated (Unknown ideal weight.). Liver Function Tests:  Recent Labs Lab 04/11/16 0509  AST 15  ALT 15  ALKPHOS 76  BILITOT 0.8  PROT 6.8  ALBUMIN 3.7   No results for input(s): LIPASE, AMYLASE in the last 168 hours. No results for input(s): AMMONIA in the last 168 hours. Coagulation Profile: No results for input(s): INR, PROTIME in the last 168 hours. Cardiac Enzymes:  Recent Labs Lab 04/10/16 1028  TROPONINI <0.03   BNP (last 3 results) No results for input(s):  PROBNP in the last 8760 hours. HbA1C: No results for input(s): HGBA1C in the last 72 hours. CBG: No results for input(s): GLUCAP in the last 168 hours. Lipid Profile: No results for input(s): CHOL, HDL, LDLCALC, TRIG, CHOLHDL, LDLDIRECT in the last 72 hours. Thyroid Function Tests: No results for input(s): TSH, T4TOTAL, FREET4, T3FREE, THYROIDAB in the last 72 hours. Anemia Panel: No results for input(s): VITAMINB12, FOLATE, FERRITIN, TIBC, IRON, RETICCTPCT in the last 72 hours. Sepsis Labs: No results for input(s): PROCALCITON, LATICACIDVEN in the last 168 hours.  Recent Results (from the past 240 hour(s))  Culture, blood (Routine X 2) w Reflex to ID Panel     Status: None (Preliminary result)   Collection Time: 04/10/16  6:50 PM  Result Value Ref Range Status   Specimen Description BLOOD RIGHT ANTECUBITAL  Final   Special Requests BOTTLES DRAWN AEROBIC AND ANAEROBIC 5 CC  Final   Culture NO GROWTH < 24 HOURS  Final   Report Status PENDING  Incomplete  Culture, blood (Routine X 2) w Reflex to ID Panel     Status: None (Preliminary result)   Collection Time: 04/10/16  6:57 PM  Result Value Ref Range Status   Specimen Description BLOOD RIGHT HAND  Final   Special Requests IN PEDIATRIC BOTTLE 3 CC  Final   Culture NO GROWTH < 24 HOURS  Final   Report Status PENDING  Incomplete         Radiology Studies: Dg Chest 2 View  Result Date: 04/10/2016 CLINICAL DATA:  Productive cough, congestion with fever EXAM: CHEST  2 VIEW COMPARISON:  07/20/2014 FINDINGS: The heart size and mediastinal contours are within normal limits. Both lungs are clear. The visualized skeletal structures are unremarkable. IMPRESSION: No active cardiopulmonary disease. Electronically Signed   By: Kathreen Devoid   On: 04/10/2016 11:23        Scheduled Meds: . clonazePAM  1.5 mg Oral Daily  . enoxaparin (LOVENOX) injection  40 mg Subcutaneous Q24H  . guaiFENesin  600 mg Oral BID  . ipratropium-albuterol  3  mL Nebulization TID  . levofloxacin (LEVAQUIN) IV  750 mg Intravenous Q24H  . losartan  100 mg Oral Daily  . methylPREDNISolone (SOLU-MEDROL) injection  60 mg Intravenous Q8H  . nicotine  7 mg Transdermal Daily  . rOPINIRole  0.25 mg Oral Q2000   Continuous Infusions:    LOS: 0 days    Time spent: 35 minutes    Kelvin Cellar, MD [facility] Pager [phone]  If 7PM-7AM, please contact night-coverage [email or web address] Password [facility] 04/11/2016, 3:43 PM

## 2016-04-11 NOTE — Care Management Note (Signed)
Case Management Note  Patient Details  Name: Kirsten Harper MRN: [id number] Date of Birth: Sep 21, 1945  Subjective/Objective:                 Independent patient from home in obs for acute resp fx, receiving IV steroids.    Action/Plan:  Will continue to follow for DC planning needs.  Expected Discharge Date:                  Expected Discharge Plan:  Home/Self Care  In-House Referral:     Discharge planning Services  CM Consult  Post Acute Care Choice:    Choice offered to:     DME Arranged:    DME Agency:     HH Arranged:    HH Agency:     Status of Service:  In process, will continue to follow  If discussed at Long Length of Stay Meetings, dates discussed:    Additional Comments:  Carles Collet, RN 04/11/2016, 10:23 AM

## 2016-04-11 NOTE — Progress Notes (Signed)
Inpatient Diabetes Program Recommendations  AACE/ADA: New Consensus Statement on Inpatient Glycemic Control (2015)  Target Ranges:  Prepandial:   less than 140 mg/dL      Peak postprandial:   less than 180 mg/dL (1-2 hours)      Critically ill patients:  140 - 180 mg/dL   No results found for: GLUCAP, HGBA1C  Review of Glycemic Control  Inpatient Diabetes Program Recommendations:    No known hx DM noted. Please consider while on steroids, Novolog correction 0-9 units tid with meals and A1c to determine prehospital glycemic control.  Thank you, Nani Gasser. Chan Sheahan, RN, MSN, CDE Inpatient Glycemic Control Team Team Pager [phone] (8am-5pm) 04/11/2016 11:40 AM

## 2016-04-11 NOTE — Care Management Obs Status (Signed)
[place] NOTIFICATION   Patient Details  Name: YSA EVOY MRN: [id number] Date of Birth: 1946/05/14   Medicare Observation Status Notification Given:  Yes    Norina Buzzard, RN 04/11/2016, 11:49 AM

## 2016-04-12 LAB — BASIC METABOLIC PANEL
ANION GAP: 9 (ref 5–15)
BUN: 16 mg/dL (ref 6–20)
CHLORIDE: 108 mmol/L (ref 101–111)
CO2: 23 mmol/L (ref 22–32)
Calcium: 9.6 mg/dL (ref 8.9–10.3)
Creatinine, Ser: 0.91 mg/dL (ref 0.44–1.00)
GFR calc non Af Amer: >60 mL/min (ref >60)
Glucose, Bld: 223 mg/dL — ABNORMAL HIGH (ref 65–99)
POTASSIUM: 4 mmol/L (ref 3.5–5.1)
Sodium: 140 mmol/L (ref 135–145)

## 2016-04-12 LAB — CBC
HEMATOCRIT: 36.7 % (ref 36.0–46.0)
HEMOGLOBIN: 12.2 g/dL (ref 12.0–15.0)
MCH: 31.6 pg (ref 26.0–34.0)
MCHC: 33.2 g/dL (ref 30.0–36.0)
MCV: 95.1 fL (ref 78.0–100.0)
Platelets: 211 10*3/uL (ref 150–400)
RBC: 3.86 MIL/uL — AB (ref 3.87–5.11)
RDW: 12.9 % (ref 11.5–15.5)
WBC: 10.9 10*3/uL — AB (ref 4.0–10.5)

## 2016-04-12 NOTE — Progress Notes (Signed)
PROGRESS NOTE    Kirsten Harper  [id number] DOB: Dec 08, 1945 DOA: 04/10/2016 PCP: Osborne Casco, MD   Brief Narrative:  Kirsten Harper is a pleasant 70 year old female with a past medical history of chronic obstructive pulmonary disease, admitted to the medicine service on 04/10/2016 when she presented with complaints of cough associate with shortness of breath and wheezing. Symptoms felt to be secondary to COPD exacerbation and was started on IV steroids, nebulizer treatments and empiric antibiotic therapy. Chest x-ray did not reveal acute infiltrate.   Assessment & Plan:   Active Problems:   Acute respiratory failure with hypoxia (HCC)   COPD exacerbation (HCC)   Anxiety   Depression  1.  Acute hypoxemic respiratory failure -Evidence by an O2 sat of 83% on room air, likely caused by COPD exacerbation -Plan to continue treatment with IV steroids, nebulizer treatments, antibiotic therapy -On 04/12/2016 she remains symptomatic  2.  COPD exacerbation -On exam continues to have diminished breath sounds bilaterally with associated expiratory wheezes -Suspect underlying viral or bacterial infection precipitating COPD exacerbation as she reported having sick contacts at home -She is on SoluMedrol 60 mg IV every 8 hours along with scheduled nebulizer treatments and Levaquin 500 mg every 24 hours -Slow improvement  3.  History of tobacco abuse -Unfortunately she continues to smoke, she was extensively counseled on tobacco cessation  4.  Hypertension -Blood pressures controlled with last blood pressure 08/07/1956 -Continue losartan 100 mg by mouth daily   DVT prophylaxis: Lovenox Code Status: Full code Family Communication:  Disposition Plan: Continue steroids, nebs treatments, antibiotic therapy  Antimicrobials:   Levaquin    Subjective: She states feeling about the same, didn't sleep well last night  Objective: Vitals:   04/11/16 2131 04/12/16 0524 04/12/16 0809  04/12/16 0840  BP: (!) 126/54 132/68  (!) 145/54  Pulse: 98 77 91 91  Resp: 18 17 18    Temp: 98.4 F (36.9 C) 97.7 F (36.5 C)    TempSrc: Oral Oral    SpO2: 96% 99% 97%     Intake/Output Summary (Last 24 hours) at 04/12/16 1242 Last data filed at 04/12/16 0019  Gross per 24 hour  Intake              650 ml  Output              700 ml  Net              -50 ml   There were no vitals filed for this visit.  Examination:  General exam: Chronically ill-appearing, on supplemental oxygen Respiratory system: Diminished breath sounds bilaterally with diffuse expiratory wheezes Cardiovascular system: S1 & S2 heard, RRR. No JVD, murmurs, rubs, gallops or clicks. No pedal edema. Gastrointestinal system: Abdomen is nondistended, soft and nontender. No organomegaly or masses felt. Normal bowel sounds heard. Central nervous system: Alert and oriented. No focal neurological deficits. Extremities: Symmetric 5 x 5 power. Skin: No rashes, lesions or ulcers Psychiatry: Judgement and insight appear normal. Mood & affect appropriate.     Data Reviewed: I have personally reviewed following labs and imaging studies  CBC:  Recent Labs Lab 04/10/16 1028 04/11/16 0509 04/12/16 0501  WBC 11.0* 7.9 10.9*  NEUTROABS 7.9*  --   --   HGB 13.6 12.7 12.2  HCT 39.4 37.9 36.7  MCV 94.7 95.5 95.1  PLT 209 221 [id number]   Basic Metabolic Panel:  Recent Labs Lab 04/10/16 1028 04/11/16 0509 04/12/16 0501  NA 138 139 140  K 3.9 4.8 4.0  CL 105 107 108  CO2 24 25 23   GLUCOSE 121* 237* 223*  BUN 8 12 16   CREATININE 0.91 1.02* 0.91  CALCIUM 9.5 9.5 9.6   GFR: CrCl cannot be calculated (Unknown ideal weight.). Liver Function Tests:  Recent Labs Lab 04/11/16 0509  AST 15  ALT 15  ALKPHOS 76  BILITOT 0.8  PROT 6.8  ALBUMIN 3.7   No results for input(s): LIPASE, AMYLASE in the last 168 hours. No results for input(s): AMMONIA in the last 168 hours. Coagulation Profile: No results for  input(s): INR, PROTIME in the last 168 hours. Cardiac Enzymes:  Recent Labs Lab 04/10/16 1028  TROPONINI <0.03   BNP (last 3 results) No results for input(s): PROBNP in the last 8760 hours. HbA1C: No results for input(s): HGBA1C in the last 72 hours. CBG: No results for input(s): GLUCAP in the last 168 hours. Lipid Profile: No results for input(s): CHOL, HDL, LDLCALC, TRIG, CHOLHDL, LDLDIRECT in the last 72 hours. Thyroid Function Tests: No results for input(s): TSH, T4TOTAL, FREET4, T3FREE, THYROIDAB in the last 72 hours. Anemia Panel: No results for input(s): VITAMINB12, FOLATE, FERRITIN, TIBC, IRON, RETICCTPCT in the last 72 hours. Sepsis Labs: No results for input(s): PROCALCITON, LATICACIDVEN in the last 168 hours.  Recent Results (from the past 240 hour(s))  Culture, blood (Routine X 2) w Reflex to ID Panel     Status: None (Preliminary result)   Collection Time: 04/10/16  6:50 PM  Result Value Ref Range Status   Specimen Description BLOOD RIGHT ANTECUBITAL  Final   Special Requests BOTTLES DRAWN AEROBIC AND ANAEROBIC 5 CC  Final   Culture NO GROWTH < 24 HOURS  Final   Report Status PENDING  Incomplete  Culture, blood (Routine X 2) w Reflex to ID Panel     Status: None (Preliminary result)   Collection Time: 04/10/16  6:57 PM  Result Value Ref Range Status   Specimen Description BLOOD RIGHT HAND  Final   Special Requests IN PEDIATRIC BOTTLE 3 CC  Final   Culture NO GROWTH < 24 HOURS  Final   Report Status PENDING  Incomplete         Radiology Studies: No results found.      Scheduled Meds: . clonazePAM  1.5 mg Oral Daily  . enoxaparin (LOVENOX) injection  40 mg Subcutaneous Q24H  . guaiFENesin  600 mg Oral BID  . ipratropium-albuterol  3 mL Nebulization TID  . levofloxacin (LEVAQUIN) IV  750 mg Intravenous Q24H  . losartan  100 mg Oral Daily  . methylPREDNISolone (SOLU-MEDROL) injection  60 mg Intravenous Q8H  . nicotine  7 mg Transdermal Daily  .  rOPINIRole  0.25 mg Oral Q2000   Continuous Infusions:    LOS: 1 day    Time spent: 25 minutes    Kirsten Cellar, MD [facility] Pager [phone]  If 7PM-7AM, please contact night-coverage [email or web address] Password [facility] 04/12/2016, 12:42 PM

## 2016-04-12 NOTE — Plan of Care (Signed)
Problem: Activity: Goal: Risk for activity intolerance will decrease Outcome: Progressing Ambulated in hall with Dr. Coralyn Pear up to desk and around the circle.

## 2016-04-12 NOTE — Progress Notes (Signed)
   04/12/16 1005  Clinical Encounter Type  Visited With Patient  Visit Type Initial  Referral From Patient  Spiritual Encounters  Spiritual Needs Other (Comment) (Advanced Directive)   Chaplain facilitated completion of living will and HCPOA  - Rev. Fouke MDiv ThM

## 2016-04-13 LAB — CBC
HEMATOCRIT: 37.7 % (ref 36.0–46.0)
Hemoglobin: 12.6 g/dL (ref 12.0–15.0)
MCH: 32.3 pg (ref 26.0–34.0)
MCHC: 33.4 g/dL (ref 30.0–36.0)
MCV: 96.7 fL (ref 78.0–100.0)
PLATELETS: 221 10*3/uL (ref 150–400)
RBC: 3.9 MIL/uL (ref 3.87–5.11)
RDW: 13.1 % (ref 11.5–15.5)
WBC: 10.2 10*3/uL (ref 4.0–10.5)

## 2016-04-13 LAB — BASIC METABOLIC PANEL
Anion gap: 8 (ref 5–15)
BUN: 21 mg/dL — AB (ref 6–20)
CALCIUM: 9.5 mg/dL (ref 8.9–10.3)
CO2: 24 mmol/L (ref 22–32)
Chloride: 106 mmol/L (ref 101–111)
Creatinine, Ser: 0.89 mg/dL (ref 0.44–1.00)
GFR calc Af Amer: >60 mL/min (ref >60)
Glucose, Bld: 206 mg/dL — ABNORMAL HIGH (ref 65–99)
POTASSIUM: 4 mmol/L (ref 3.5–5.1)
SODIUM: 138 mmol/L (ref 135–145)

## 2016-04-13 MED ORDER — FLUTICASONE PROPIONATE 50 MCG/ACT NA SUSP: 2.0000 | Freq: Every day | NASAL | 0 refills | Status: DC

## 2016-04-13 MED ORDER — PREDNISONE 10 MG (21) PO TBPK: ORAL_TABLET | ORAL | 0 refills | Status: DC

## 2016-04-13 MED ORDER — ACETAMINOPHEN 325 MG PO TABS: 650.0000 mg | ORAL_TABLET | Freq: Four times a day (QID) | ORAL | 0 refills | Status: DC | PRN

## 2016-04-13 MED ORDER — GUAIFENESIN ER 600 MG PO TB12
1200.0000 mg | ORAL_TABLET | Freq: Two times a day (BID) | ORAL | 0 refills | Status: DC
Start: 2016-04-13 — End: 2016-06-11

## 2016-04-13 MED ORDER — DOXYCYCLINE HYCLATE 100 MG PO CAPS: 100.0000 mg | ORAL_CAPSULE | Freq: Two times a day (BID) | ORAL | 0 refills | Status: DC

## 2016-04-13 MED ORDER — ALBUTEROL SULFATE HFA 108 (90 BASE) MCG/ACT IN AERS: 2.0000 | INHALATION_SPRAY | RESPIRATORY_TRACT | 1 refills | Status: DC | PRN

## 2016-04-13 NOTE — Discharge Summary (Signed)
Physician Discharge Summary  Kirsten Harper [id number] DOB: 1945-12-22 DOA: 04/10/2016  PCP: Osborne Casco, MD  Admit date: 04/10/2016 Discharge date: 04/13/2016  Time spent: 35 minutes  Recommendations for Outpatient Follow-up:  1. Please follow up her respiratory status, admitted for COPD exacerbation got better with IV steroids, Nebs and antibiotic therapy. Discharged on Prednisone taper   Discharge Diagnoses:  Active Problems:   Acute respiratory failure with hypoxia (HCC)   COPD exacerbation (HCC)   Anxiety   Depression   Discharge Condition: Stable  Diet recommendation: Heart Healthy  There were no vitals filed for this visit.  History of present illness:  Kirsten Harper is a 70 y.o. female with medical history significant for COPD, current smoker, not on O2 or steroids at home, presenting with 4 day history of progressive cough with productive yellow green sputum. Symptoms began after visiting her mother in hospital with pneumonia. She reported fever up to 101 last night. She denies chills or night sweats. She denies any recent trips. She continues to smoke.  She reported shortness of breath on presentation, due to coughing spells. She has been compliant with her inhalers at home. She denies any hemoptysis. She denies any nausea  Or vomiting, No diarrhea. She reported "leg swelling"  At the ED she was desating, with O2 levels in the 80's, improved with 2 L [place]. She also received nebs and IV solumedrol with improvement of symptoms. Last recorded bronchitic episode in Jan 2016 and was felt to be viral  Hospital Course:  Kirsten Harper is a pleasant 70 year old female with a past medical history of chronic obstructive pulmonary disease, admitted to the medicine service on 04/10/2016 when she presented with complaints of cough associate with shortness of breath and wheezing. Symptoms felt to be secondary to COPD exacerbation and was started on IV steroids, nebulizer treatments  and empiric antibiotic therapy. Chest x-ray did not reveal acute infiltrate.  1.  Acute hypoxemic respiratory failure -Evidence by an O2 sat of 83% on room air, likely caused by COPD exacerbation -Plan to continue treatment with IV steroids, nebulizer treatments, antibiotic therapy -On 04/12/2016 she remains symptomatic -By 04/13/2016 she was looking much better, ambulated down the hallway off of supplemental oxygen maintaining sats in the 90's.  -Discharged home on prednisone taper and doxy x 5 days.   2.  COPD exacerbation -On exam continues to have diminished breath sounds bilaterally with associated expiratory wheezes -Suspect underlying viral or bacterial infection precipitating COPD exacerbation as she reported having sick contacts at home -She is on SoluMedrol 60 mg IV every 8 hours along with scheduled nebulizer treatments and Levaquin 500 mg every 24 hours -Discharged on Prednisone Taper with Doxy.   3.  History of tobacco abuse -Unfortunately she continues to smoke, she was extensively counseled on tobacco cessation  4.  Hypertension -Blood pressures controlled  -Continue losartan 100 mg by mouth daily   Discharge Exam: Vitals:   04/12/16 2101 04/13/16 0456  BP: (!) 134/56 (!) 141/69  Pulse: 98 73  Resp: 18 20  Temp: 98.4 F (36.9 C) 98.1 F (36.7 C)    General exam: Looks much better, ambulating down the hallway without oxygen Respiratory system: Interim improvement to lung exam, better air movement, decreased wheezes Cardiovascular system: S1 & S2 heard, RRR. No JVD, murmurs, rubs, gallops or clicks. No pedal edema. Gastrointestinal system: Abdomen is nondistended, soft and nontender. No organomegaly or masses felt. Normal bowel sounds heard. Central nervous system: Alert and oriented. No focal  neurological deficits. Extremities: Symmetric 5 x 5 power. Skin: No rashes, lesions or ulcers Psychiatry: Judgement and insight appear normal. Mood & affect appropriate.    Discharge Instructions   Discharge Instructions    Call MD for:    Complete by:  As directed    Call MD for:  difficulty breathing, headache or visual disturbances    Complete by:  As directed    Call MD for:  extreme fatigue    Complete by:  As directed    Call MD for:  hives    Complete by:  As directed    Call MD for:  persistant dizziness or light-headedness    Complete by:  As directed    Call MD for:  persistant nausea and vomiting    Complete by:  As directed    Call MD for:  redness, tenderness, or signs of infection (pain, swelling, redness, odor or green/yellow discharge around incision site)    Complete by:  As directed    Call MD for:  severe uncontrolled pain    Complete by:  As directed    Call MD for:  temperature >100.4    Complete by:  As directed    Diet - low sodium heart healthy    Complete by:  As directed    Increase activity slowly    Complete by:  As directed      Current Discharge Medication List    START taking these medications   Details  acetaminophen (TYLENOL) 325 MG tablet Take 2 tablets (650 mg total) by mouth every 6 (six) hours as needed for mild pain (or Fever >/= 101). Qty: 30 tablet, Refills: 0    doxycycline (VIBRAMYCIN) 100 MG capsule Take 1 capsule (100 mg total) by mouth 2 (two) times daily. Qty: 10 capsule, Refills: 0    guaiFENesin (MUCINEX) 600 MG 12 hr tablet Take 2 tablets (1,200 mg total) by mouth 2 (two) times daily. Qty: 30 tablet, Refills: 0    predniSONE (STERAPRED UNI-PAK 21 TAB) 10 MG (21) TBPK tablet Take 6-5-4-3-2-1 tablets by mouth daily till gone. Qty: 21 tablet, Refills: 0      CONTINUE these medications which have CHANGED   Details  albuterol (PROVENTIL HFA;VENTOLIN HFA) 108 (90 Base) MCG/ACT inhaler Inhale 2 puffs into the lungs every 4 (four) hours as needed for wheezing or shortness of breath (cough, shortness of breath or wheezing.). Qty: 1 Inhaler, Refills: 1   Associated Diagnoses: Acute bronchitis,  viral; Wheezing; Cough    fluticasone (FLONASE) 50 MCG/ACT nasal spray Place 2 sprays into both nostrils daily. Qty: 1 g, Refills: 0   Associated Diagnoses: Sinusitis      CONTINUE these medications which have NOT CHANGED   Details  clonazePAM (KLONOPIN) 1 MG tablet Take 1.5 mg by mouth daily.    hydrOXYzine (ATARAX/VISTARIL) 25 MG tablet Take 25 mg by mouth 3 (three) times daily as needed for itching.    losartan (COZAAR) 100 MG tablet Take 100 mg by mouth daily.    sulfaSALAzine (AZULFIDINE) 500 MG tablet Take 500 mg by mouth daily as needed (arthritis).      STOP taking these medications     benzonatate (TESSALON) 100 MG capsule      HYDROcodone-homatropine (HYCODAN) 5-1.5 MG/5ML syrup      predniSONE (DELTASONE) 20 MG tablet        Allergies  Allergen Reactions  . Amoxapine And Related       The results of significant diagnostics from this  hospitalization (including imaging, microbiology, ancillary and laboratory) are listed below for reference.    Significant Diagnostic Studies: Dg Chest 2 View  Result Date: 04/10/2016 CLINICAL DATA:  Productive cough, congestion with fever EXAM: CHEST  2 VIEW COMPARISON:  07/20/2014 FINDINGS: The heart size and mediastinal contours are within normal limits. Both lungs are clear. The visualized skeletal structures are unremarkable. IMPRESSION: No active cardiopulmonary disease. Electronically Signed   By: Kathreen Devoid   On: 04/10/2016 11:23    Microbiology: Recent Results (from the past 240 hour(s))  Culture, blood (Routine X 2) w Reflex to ID Panel     Status: None (Preliminary result)   Collection Time: 04/10/16  6:50 PM  Result Value Ref Range Status   Specimen Description BLOOD RIGHT ANTECUBITAL  Final   Special Requests BOTTLES DRAWN AEROBIC AND ANAEROBIC 5 CC  Final   Culture NO GROWTH 2 DAYS  Final   Report Status PENDING  Incomplete  Culture, blood (Routine X 2) w Reflex to ID Panel     Status: None (Preliminary  result)   Collection Time: 04/10/16  6:57 PM  Result Value Ref Range Status   Specimen Description BLOOD RIGHT HAND  Final   Special Requests IN PEDIATRIC BOTTLE 3 CC  Final   Culture NO GROWTH 2 DAYS  Final   Report Status PENDING  Incomplete     Labs: Basic Metabolic Panel:  Recent Labs Lab 04/10/16 1028 04/11/16 0509 04/12/16 0501 04/13/16 0620  NA 138 139 140 138  K 3.9 4.8 4.0 4.0  CL 105 107 108 106  CO2 24 25 23 24   GLUCOSE 121* 237* 223* 206*  BUN 8 12 16  21*  CREATININE 0.91 1.02* 0.91 0.89  CALCIUM 9.5 9.5 9.6 9.5   Liver Function Tests:  Recent Labs Lab 04/11/16 0509  AST 15  ALT 15  ALKPHOS 76  BILITOT 0.8  PROT 6.8  ALBUMIN 3.7   No results for input(s): LIPASE, AMYLASE in the last 168 hours. No results for input(s): AMMONIA in the last 168 hours. CBC:  Recent Labs Lab 04/10/16 1028 04/11/16 0509 04/12/16 0501 04/13/16 0620  WBC 11.0* 7.9 10.9* 10.2  NEUTROABS 7.9*  --   --   --   HGB 13.6 12.7 12.2 12.6  HCT 39.4 37.9 36.7 37.7  MCV 94.7 95.5 95.1 96.7  PLT 209 221 211 221   Cardiac Enzymes:  Recent Labs Lab 04/10/16 1028  TROPONINI <0.03   BNP: BNP (last 3 results)  Recent Labs  04/10/16 1900  BNP 30.4    ProBNP (last 3 results) No results for input(s): PROBNP in the last 8760 hours.  CBG: No results for input(s): GLUCAP in the last 168 hours.     Signed:  Kelvin Cellar MD.  [facility] 04/13/2016, 9:50 AM

## 2016-04-15 LAB — CULTURE, BLOOD (ROUTINE X 2)
CULTURE: NO GROWTH
Culture: NO GROWTH

## 2016-04-24 DIAGNOSIS — J9690 Respiratory failure, unspecified, unspecified whether with hypoxia or hypercapnia: Secondary | ICD-10-CM | POA: Diagnosis not present

## 2016-04-24 DIAGNOSIS — J449 Chronic obstructive pulmonary disease, unspecified: Secondary | ICD-10-CM | POA: Diagnosis not present

## 2016-04-24 DIAGNOSIS — F17210 Nicotine dependence, cigarettes, uncomplicated: Secondary | ICD-10-CM | POA: Diagnosis not present

## 2016-05-06 DIAGNOSIS — Z79899 Other long term (current) drug therapy: Secondary | ICD-10-CM | POA: Diagnosis not present

## 2016-05-12 ENCOUNTER — Encounter: Payer: Self-pay | Admitting: Rheumatology

## 2016-05-13 ENCOUNTER — Other Ambulatory Visit: Payer: Self-pay | Admitting: Rheumatology

## 2016-05-13 ENCOUNTER — Telehealth: Payer: Self-pay | Admitting: Radiology

## 2016-05-13 ENCOUNTER — Telehealth: Payer: Self-pay | Admitting: Rheumatology

## 2016-05-13 DIAGNOSIS — Z79899 Other long term (current) drug therapy: Secondary | ICD-10-CM

## 2016-05-13 NOTE — Telephone Encounter (Signed)
I spoke to patient to advise her kidney function has decreased slightly decrease. Patient was recently in the hospital for an illness, and was on antibiotics. I have advised her to discuss with PCP since she has asked about a referral to nephrology. She will do this. I have advised her you would like to recheck in 66mo. Put in the orders.   She has told me she does not take the SSZ

## 2016-05-13 NOTE — Telephone Encounter (Signed)
Patient would like to know lab results from last week. Please advise.

## 2016-05-13 NOTE — Progress Notes (Signed)
Reduce SSZ to 1 BID and check BMP in 1 mth

## 2016-05-13 NOTE — Telephone Encounter (Signed)
Have called patient to advise.

## 2016-05-14 NOTE — Telephone Encounter (Signed)
Ok will monitor for now.

## 2016-05-19 DIAGNOSIS — Z23 Encounter for immunization: Secondary | ICD-10-CM | POA: Diagnosis not present

## 2016-05-20 DIAGNOSIS — F329 Major depressive disorder, single episode, unspecified: Secondary | ICD-10-CM | POA: Diagnosis not present

## 2016-05-20 DIAGNOSIS — M2550 Pain in unspecified joint: Secondary | ICD-10-CM | POA: Diagnosis not present

## 2016-05-20 DIAGNOSIS — Z0000 Encounter for general adult medical examination without abnormal findings: Secondary | ICD-10-CM | POA: Diagnosis not present

## 2016-05-20 DIAGNOSIS — J449 Chronic obstructive pulmonary disease, unspecified: Secondary | ICD-10-CM | POA: Diagnosis not present

## 2016-05-20 DIAGNOSIS — F39 Unspecified mood [affective] disorder: Secondary | ICD-10-CM | POA: Diagnosis not present

## 2016-05-20 DIAGNOSIS — M479 Spondylosis, unspecified: Secondary | ICD-10-CM | POA: Diagnosis not present

## 2016-05-20 DIAGNOSIS — I129 Hypertensive chronic kidney disease with stage 1 through stage 4 chronic kidney disease, or unspecified chronic kidney disease: Secondary | ICD-10-CM | POA: Diagnosis not present

## 2016-05-20 DIAGNOSIS — N183 Chronic kidney disease, stage 3 (moderate): Secondary | ICD-10-CM | POA: Diagnosis not present

## 2016-06-06 DIAGNOSIS — M19042 Primary osteoarthritis, left hand: Secondary | ICD-10-CM

## 2016-06-06 DIAGNOSIS — Z79899 Other long term (current) drug therapy: Secondary | ICD-10-CM | POA: Insufficient documentation

## 2016-06-06 DIAGNOSIS — M47819 Spondylosis without myelopathy or radiculopathy, site unspecified: Secondary | ICD-10-CM | POA: Insufficient documentation

## 2016-06-06 DIAGNOSIS — M19041 Primary osteoarthritis, right hand: Secondary | ICD-10-CM | POA: Insufficient documentation

## 2016-06-06 DIAGNOSIS — F17200 Nicotine dependence, unspecified, uncomplicated: Secondary | ICD-10-CM | POA: Insufficient documentation

## 2016-06-06 DIAGNOSIS — H209 Unspecified iridocyclitis: Secondary | ICD-10-CM | POA: Insufficient documentation

## 2016-06-06 NOTE — Progress Notes (Signed)
 Office Visit Note  Patient: Kirsten Harper             Date of Birth: 02/10/1946           MRN: [id number]             PCP: GRIFFIN,ELAINE COLLINS, MD Referring: Griffin, Elaine, MD Visit Date: 06/11/2016 Occupation: Proofreading for advertisements    Subjective:  Lower back pain   History of Present Illness: Kirsten Harper is a 70 y.o. female we will spondyloarthropathy HLA-B27 positive. She has had problems with lower back pain and she reports recurrent iritis. She has been seeing Dr. Groat and has been given steroid eyedrops. She states she's not been taking sulfasalazine. She denies any joint pain or joint inflammation. Her main concern is lower back pain only. She continues to have lower extremity muscle spasms despite taking clonazepam. She continues to have chronic insomnia.   Activities of Daily Living:  Patient reports morning stiffness for 0 minute.   Patient Denies nocturnal pain.  Difficulty dressing/grooming: Denies Difficulty climbing stairs: Denies Difficulty getting out of chair: Denies Difficulty using hands for taps, buttons, cutlery, and/or writing: Denies   Review of Systems  Constitutional: Positive for fatigue. Negative for night sweats, weight gain, weight loss and weakness.  HENT: Negative for mouth sores, trouble swallowing, trouble swallowing, mouth dryness and nose dryness.   Eyes: Positive for photophobia and pain. Negative for redness, visual disturbance and dryness.  Respiratory: Negative for cough, shortness of breath and difficulty breathing.   Cardiovascular: Negative for chest pain, palpitations, hypertension, irregular heartbeat and swelling in legs/feet.  Gastrointestinal: Negative for blood in stool, constipation and diarrhea.  Endocrine: Negative for increased urination.  Genitourinary: Negative for vaginal dryness.  Musculoskeletal: Positive for arthralgias, joint pain, myalgias and myalgias. Negative for joint swelling, muscle weakness,  morning stiffness and muscle tenderness.  Skin: Negative for color change, rash, hair loss, skin tightness, ulcers and sensitivity to sunlight.  Allergic/Immunologic: Negative for susceptible to infections.  Neurological: Negative for dizziness, memory loss and night sweats.  Hematological: Negative for swollen glands.  Psychiatric/Behavioral: Positive for sleep disturbance. Negative for depressed mood. The patient is not nervous/anxious.     PMFS History:  Patient Active Problem List   Diagnosis Date Noted  . Spondyloarthropathy HLA-B27 positive 06/06/2016  . Iritis 06/06/2016  . High risk medication use 06/06/2016  . Primary osteoarthritis of both hands 06/06/2016  . Smoker 06/06/2016  . Acute respiratory failure with hypoxia (HCC) 04/10/2016  . COPD exacerbation (HCC) 04/10/2016  . Anxiety 04/10/2016  . Depression 04/10/2016  . Essential hypertension     Past Medical History:  Diagnosis Date  . Ankylosing spondylitis (HCC)   . Anxiety   . Arthritis   . Chronic lower back pain   . CKD (chronic kidney disease), stage II    "related to the Ankylosing spondylitis" (04/10/2016)  . COPD (chronic obstructive pulmonary disease) (HCC)   . Eye disease   . Hypertension   . PONV (postoperative nausea and vomiting)    "didn't bother me the last time I had it in ~ 2015"    Family History  Problem Relation Age of Onset  . Hypertension Mother   . Heart disease Sister   . Diabetes Sister   . Pulmonary fibrosis Brother   . Alzheimer's disease Brother    Past Surgical History:  Procedure Laterality Date  . BROW LIFT AND BLEPHAROPLASTY Bilateral ~ 2015  . CATARACT EXTRACTION W/ INTRAOCULAR LENS  IMPLANT,   BILATERAL Bilateral 2000s  . MANDIBLE SURGERY  ~ 2000   "jaw had moved to left; broke my jaw in 5 places; put titanium in; stripped muscle from bone; wired jaw shut"  . TUBAL LIGATION  1979  . VAGINAL HYSTERECTOMY  1985   Social History   Social History Narrative  . No narrative  on file     Objective: Vital Signs: BP 120/75 (BP Location: Left Arm, Patient Position: Sitting, Cuff Size: Large)   Pulse 85   Resp 14   Ht 5' 7" (1.702 m)   Wt 183 lb (83 kg)   BMI 28.66 kg/m    Physical Exam  Constitutional: She is oriented to person, place, and time. She appears well-developed and well-nourished.  HENT:  Head: Normocephalic and atraumatic.  Eyes: Conjunctivae and EOM are normal.  Neck: Normal range of motion.  Cardiovascular: Normal rate, regular rhythm, normal heart sounds and intact distal pulses.   Pulmonary/Chest: Effort normal and breath sounds normal.  Abdominal: Soft. Bowel sounds are normal.  Lymphadenopathy:    She has no cervical adenopathy.  Neurological: She is alert and oriented to person, place, and time.  Skin: Skin is warm and dry. Capillary refill takes less than 2 seconds.  Psychiatric: She has a normal mood and affect. Her behavior is normal.  Nursing note and vitals reviewed.    Musculoskeletal Exam: C-spine, thoracic spine, lumbar spine good range of motion she has no tenderness or SI joint area. Shoulder joints elbow joints wrist joints, MCPs PIPs DIPs with good range of motion she has some prominence of PIP/DIP joints in her hands and feet consistent with osteoarthritis. No synovitis was noted. Hip joints and knee joints are good range of motion with no synovitis.  CDAI Exam: CDAI Homunculus Exam:   Joint Counts:  CDAI Tender Joint count: 0 CDAI Swollen Joint count: 0  Global Assessments:  Patient Global Assessment: 2 Provider Global Assessment: 2  CDAI Calculated Score: 4    Investigation: Findings:  2015 chest x-ray normal 12/26/2015: CMP normal, CBC normal 04/13/2016: CBC normal, BMP glucose 206    Imaging: No results found.  Speciality Comments: No specialty comments available.    Procedures:  No procedures performed Allergies: Amoxapine and related   Assessment / Plan:     Visit Diagnoses:  Spondyloarthropathy HLA-B27 positive - SI joint sclerosis, history of knee joint effusion, erosive changes in hands and feet: Patient states that she is some having lower back pain and discomfort only after prolonged walking she has not noticed any swelling in any of her joints. She's not been taking sulfasalazine as well. She has no synovitis on examination today.  Iritis: She reports recurrent problems for which she's been seeing Dr. Katy Fitch.  High risk medication use - Sulfasalazine taking intermittently. We had detailed discussion about the benefits of sulfasalazine and it may help her with her lower back pain and maybe with iritis we have to give it a try for at least 3 months or 4 months in a row if it fails then we can look for other options. She will get labs in December and every 3 months to monitor for drug toxicity. Her last labs in September indicate her glucose was elevated at 206 a previous value was elevated as well. We had detailed discussion regarding her diet. She reports alcohol intake and also nutrition high in carbs. Detailed counseling was provided. I've also advised to make an appointment with PCP regarding monitoring of her glucose.  Obesity: Weight  loss diet and exercise was discussed at length.  Primary osteoarthritis of both hands joint protection and muscle strengthening was discussed.  Smoker: smoking cessation was discussed.  She has following medical problems for which she is seeing her PCP:  Essential hypertension  Anxiety  Depression, unspecified depression type    Orders: Orders Placed This Encounter  Procedures  . CBC with Differential/Platelet  . COMPLETE METABOLIC PANEL WITH GFR   No orders of the defined types were placed in this encounter.   Face-to-face time spent with patient was 30 minutes. 50% of time was spent in counseling and coordination of care.  Follow-Up Instructions: Return in about 5 months (around 11/09/2016) for  Spondyloarthropathy.    , MD    

## 2016-06-11 ENCOUNTER — Encounter: Payer: Self-pay | Admitting: Rheumatology

## 2016-06-11 ENCOUNTER — Ambulatory Visit (INDEPENDENT_AMBULATORY_CARE_PROVIDER_SITE_OTHER): Payer: Medicare Other | Admitting: Rheumatology

## 2016-06-11 VITALS — BP 120/75 | HR 85 | Resp 14 | Ht 67.0 in | Wt 183.0 lb

## 2016-06-11 DIAGNOSIS — F32A Depression, unspecified: Secondary | ICD-10-CM

## 2016-06-11 DIAGNOSIS — M47819 Spondylosis without myelopathy or radiculopathy, site unspecified: Secondary | ICD-10-CM

## 2016-06-11 DIAGNOSIS — F419 Anxiety disorder, unspecified: Secondary | ICD-10-CM | POA: Diagnosis not present

## 2016-06-11 DIAGNOSIS — F17200 Nicotine dependence, unspecified, uncomplicated: Secondary | ICD-10-CM

## 2016-06-11 DIAGNOSIS — M4690 Unspecified inflammatory spondylopathy, site unspecified: Secondary | ICD-10-CM | POA: Diagnosis not present

## 2016-06-11 DIAGNOSIS — F329 Major depressive disorder, single episode, unspecified: Secondary | ICD-10-CM | POA: Diagnosis not present

## 2016-06-11 DIAGNOSIS — M19041 Primary osteoarthritis, right hand: Secondary | ICD-10-CM

## 2016-06-11 DIAGNOSIS — I10 Essential (primary) hypertension: Secondary | ICD-10-CM

## 2016-06-11 DIAGNOSIS — H209 Unspecified iridocyclitis: Secondary | ICD-10-CM

## 2016-06-11 DIAGNOSIS — M19042 Primary osteoarthritis, left hand: Secondary | ICD-10-CM

## 2016-06-11 DIAGNOSIS — Z79899 Other long term (current) drug therapy: Secondary | ICD-10-CM

## 2016-06-11 NOTE — Patient Instructions (Signed)
Standing Labs We placed an order today for your standing lab work.    Please come back and get your standing labs in December and every 3 months.  We have open lab Monday through Friday from 8:30-11:30 AM and 1-4 PM at the office of Dr. Tresa Moore, PA.   The office is located at [address] [place], [place], [place] [address] No appointment is necessary.   Labs are drawn by [facility].  You may receive a bill from [facility] for your lab work.

## 2016-06-13 DIAGNOSIS — R7301 Impaired fasting glucose: Secondary | ICD-10-CM | POA: Diagnosis not present

## 2016-07-12 DIAGNOSIS — H20021 Recurrent acute iridocyclitis, right eye: Secondary | ICD-10-CM | POA: Diagnosis not present

## 2016-07-12 DIAGNOSIS — Z961 Presence of intraocular lens: Secondary | ICD-10-CM | POA: Diagnosis not present

## 2016-07-16 DIAGNOSIS — H35371 Puckering of macula, right eye: Secondary | ICD-10-CM | POA: Diagnosis not present

## 2016-07-16 DIAGNOSIS — Z961 Presence of intraocular lens: Secondary | ICD-10-CM | POA: Diagnosis not present

## 2016-07-16 DIAGNOSIS — M450 Ankylosing spondylitis of multiple sites in spine: Secondary | ICD-10-CM | POA: Diagnosis not present

## 2016-07-16 DIAGNOSIS — H209 Unspecified iridocyclitis: Secondary | ICD-10-CM | POA: Diagnosis not present

## 2016-07-16 DIAGNOSIS — H0233 Blepharochalasis right eye, unspecified eyelid: Secondary | ICD-10-CM | POA: Diagnosis not present

## 2016-07-16 DIAGNOSIS — H35033 Hypertensive retinopathy, bilateral: Secondary | ICD-10-CM | POA: Diagnosis not present

## 2016-07-17 DIAGNOSIS — H209 Unspecified iridocyclitis: Secondary | ICD-10-CM | POA: Diagnosis not present

## 2016-07-23 DIAGNOSIS — J069 Acute upper respiratory infection, unspecified: Secondary | ICD-10-CM | POA: Diagnosis not present

## 2016-07-29 DIAGNOSIS — F329 Major depressive disorder, single episode, unspecified: Secondary | ICD-10-CM | POA: Diagnosis not present

## 2016-08-13 DIAGNOSIS — H209 Unspecified iridocyclitis: Secondary | ICD-10-CM | POA: Diagnosis not present

## 2016-08-13 DIAGNOSIS — Z961 Presence of intraocular lens: Secondary | ICD-10-CM | POA: Diagnosis not present

## 2016-08-13 DIAGNOSIS — H35371 Puckering of macula, right eye: Secondary | ICD-10-CM | POA: Diagnosis not present

## 2016-08-13 DIAGNOSIS — H0233 Blepharochalasis right eye, unspecified eyelid: Secondary | ICD-10-CM | POA: Diagnosis not present

## 2016-08-19 DIAGNOSIS — F329 Major depressive disorder, single episode, unspecified: Secondary | ICD-10-CM | POA: Diagnosis not present

## 2016-08-19 DIAGNOSIS — Z79899 Other long term (current) drug therapy: Secondary | ICD-10-CM | POA: Diagnosis not present

## 2016-08-20 ENCOUNTER — Telehealth: Payer: Self-pay | Admitting: *Deleted

## 2016-08-20 ENCOUNTER — Telehealth: Payer: Self-pay | Admitting: Rheumatology

## 2016-08-20 NOTE — Telephone Encounter (Addendum)
Patient states she she is on SSZ but is not taking it regularly. Patient states she has been running a fever of 100.8 and that her heart rate increases. Patient states this has been going on for approximately 2 weeks. Patient has also experienced chills on Saturday. Patient states she is concerned to take the SSZ as she is concerned the medication may be causing these symptoms. Could you please contact patient and speak with her?

## 2016-08-20 NOTE — Telephone Encounter (Signed)
Patient returned a phone call and is requesting a call back today please.  Did Seth Bake or Mr. Carlyon Shadow call patient?

## 2016-08-20 NOTE — Telephone Encounter (Signed)
error 

## 2016-08-21 NOTE — Telephone Encounter (Signed)
I spoke with patient todayI called her yesterday but she did not pick up her phone.Patient is convinced that sulfasalazine has caused her to have a fever the next day and so does not want to take sulfasalazine.Reviewing her notes in the past, she also reported that she is taking sulfasalazine intermittently on previous visit.She saw an eye doctor, retinologist, we advised her that she needs to be on methotrexate.She took methotrexate one time and had nausea and GI symptoms and then discontinued the methotrexate as well.She has appointment with the retinologist in March.They encouraged her that she needs to be on an immunosuppressant for the treatment of her eye problem that is coming from ankylosing spondylitis.I also encouraged her to same thing and she can choose between methotrexate and sulfasalazine.She will think about this and she will have a discussion with [place] at her next appointment in 04/17/2018Note: Once she stopped taking her sulfasalazine, her fever went away. Currently she does not have any upper respiratory infection symptoms or any flu symptoms.

## 2016-08-21 NOTE — Telephone Encounter (Signed)
Please see previous message. I've ordered a spoken with the patient.She saw an eye doctor who also advised her that she needs to be on medication.He put her on methotrexate but she only took one time and had side effects.She has been taking sulfasalazine intermittently.We will discuss all of this in detail at the follow-up visit.Please see other message for full details

## 2016-08-24 ENCOUNTER — Other Ambulatory Visit: Payer: Self-pay | Admitting: Rheumatology

## 2016-08-26 ENCOUNTER — Encounter (HOSPITAL_COMMUNITY): Payer: Self-pay | Admitting: Emergency Medicine

## 2016-08-26 ENCOUNTER — Ambulatory Visit (HOSPITAL_COMMUNITY)
Admission: RE | Admit: 2016-08-26 | Discharge: 2016-08-26 | Disposition: A | Discharge status: Home / Self Care | Payer: Medicare Other | Attending: Psychiatry | Admitting: Psychiatry

## 2016-08-26 ENCOUNTER — Emergency Department (HOSPITAL_COMMUNITY)
Admission: EM | Admit: 2016-08-26 | Discharge: 2016-08-28 | Disposition: A | Discharge status: Home / Self Care | Payer: Medicare Other | Attending: Psychiatry | Admitting: Psychiatry

## 2016-08-26 ENCOUNTER — Emergency Department (HOSPITAL_COMMUNITY): Payer: Medicare Other

## 2016-08-26 DIAGNOSIS — F1021 Alcohol dependence, in remission: Secondary | ICD-10-CM | POA: Diagnosis not present

## 2016-08-26 DIAGNOSIS — F1990 Other psychoactive substance use, unspecified, uncomplicated: Secondary | ICD-10-CM | POA: Insufficient documentation

## 2016-08-26 DIAGNOSIS — F419 Anxiety disorder, unspecified: Secondary | ICD-10-CM

## 2016-08-26 DIAGNOSIS — I129 Hypertensive chronic kidney disease with stage 1 through stage 4 chronic kidney disease, or unspecified chronic kidney disease: Secondary | ICD-10-CM | POA: Diagnosis not present

## 2016-08-26 DIAGNOSIS — F411 Generalized anxiety disorder: Secondary | ICD-10-CM | POA: Diagnosis not present

## 2016-08-26 DIAGNOSIS — F1390 Sedative, hypnotic, or anxiolytic use, unspecified, uncomplicated: Secondary | ICD-10-CM

## 2016-08-26 DIAGNOSIS — N182 Chronic kidney disease, stage 2 (mild): Secondary | ICD-10-CM | POA: Insufficient documentation

## 2016-08-26 DIAGNOSIS — R4182 Altered mental status, unspecified: Secondary | ICD-10-CM | POA: Diagnosis not present

## 2016-08-26 DIAGNOSIS — F332 Major depressive disorder, recurrent severe without psychotic features: Secondary | ICD-10-CM | POA: Diagnosis not present

## 2016-08-26 DIAGNOSIS — F17210 Nicotine dependence, cigarettes, uncomplicated: Secondary | ICD-10-CM | POA: Insufficient documentation

## 2016-08-26 DIAGNOSIS — Z79899 Other long term (current) drug therapy: Secondary | ICD-10-CM

## 2016-08-26 DIAGNOSIS — Z8249 Family history of ischemic heart disease and other diseases of the circulatory system: Secondary | ICD-10-CM

## 2016-08-26 DIAGNOSIS — F339 Major depressive disorder, recurrent, unspecified: Secondary | ICD-10-CM | POA: Insufficient documentation

## 2016-08-26 DIAGNOSIS — F1320 Sedative, hypnotic or anxiolytic dependence, uncomplicated: Secondary | ICD-10-CM | POA: Diagnosis present

## 2016-08-26 DIAGNOSIS — Z833 Family history of diabetes mellitus: Secondary | ICD-10-CM

## 2016-08-26 DIAGNOSIS — Z008 Encounter for other general examination: Secondary | ICD-10-CM

## 2016-08-26 DIAGNOSIS — F13230 Sedative, hypnotic or anxiolytic dependence with withdrawal, uncomplicated: Secondary | ICD-10-CM | POA: Diagnosis present

## 2016-08-26 DIAGNOSIS — J449 Chronic obstructive pulmonary disease, unspecified: Secondary | ICD-10-CM | POA: Insufficient documentation

## 2016-08-26 DIAGNOSIS — F1094 Alcohol use, unspecified with alcohol-induced mood disorder: Secondary | ICD-10-CM | POA: Diagnosis not present

## 2016-08-26 DIAGNOSIS — Z810 Family history of intellectual disabilities: Secondary | ICD-10-CM

## 2016-08-26 DIAGNOSIS — Z888 Allergy status to other drugs, medicaments and biological substances status: Secondary | ICD-10-CM

## 2016-08-26 LAB — COMPREHENSIVE METABOLIC PANEL
ALBUMIN: 4.6 g/dL (ref 3.5–5.0)
ALT: 23 U/L (ref 14–54)
AST: 23 U/L (ref 15–41)
Alkaline Phosphatase: 61 U/L (ref 38–126)
Anion gap: 9 (ref 5–15)
BILIRUBIN TOTAL: 1.3 mg/dL — AB (ref 0.3–1.2)
BUN: 13 mg/dL (ref 6–20)
CHLORIDE: 105 mmol/L (ref 101–111)
CO2: 27 mmol/L (ref 22–32)
CREATININE: 0.98 mg/dL (ref 0.44–1.00)
Calcium: 9.5 mg/dL (ref 8.9–10.3)
GFR calc Af Amer: >60 mL/min (ref >60)
GFR, EST NON AFRICAN AMERICAN: 58 mL/min — AB (ref >60)
GLUCOSE: 172 mg/dL — AB (ref 65–99)
POTASSIUM: 3.8 mmol/L (ref 3.5–5.1)
Sodium: 141 mmol/L (ref 135–145)
TOTAL PROTEIN: 7 g/dL (ref 6.5–8.1)

## 2016-08-26 LAB — RAPID URINE DRUG SCREEN, HOSP PERFORMED
Amphetamines: NOT DETECTED
BARBITURATES: NOT DETECTED
Benzodiazepines: POSITIVE — AB
Cocaine: NOT DETECTED
Opiates: NOT DETECTED
TETRAHYDROCANNABINOL: NOT DETECTED

## 2016-08-26 LAB — CBC
HEMATOCRIT: 35.6 % — AB (ref 36.0–46.0)
HEMOGLOBIN: 12.2 g/dL (ref 12.0–15.0)
MCH: 30.4 pg (ref 26.0–34.0)
MCHC: 34.3 g/dL (ref 30.0–36.0)
MCV: 88.8 fL (ref 78.0–100.0)
Platelets: 281 10*3/uL (ref 150–400)
RBC: 4.01 MIL/uL (ref 3.87–5.11)
RDW: 15.2 % (ref 11.5–15.5)
WBC: 10.5 10*3/uL (ref 4.0–10.5)

## 2016-08-26 LAB — ETHANOL: Alcohol, Ethyl (B): <5 mg/dL (ref <5)

## 2016-08-26 MED ORDER — LORAZEPAM 1 MG PO TABS
1.0000 mg | ORAL_TABLET | Freq: Once | ORAL | Status: AC
  Administered 2016-08-26: 1 mg via ORAL
  Filled 2016-08-26: qty 1

## 2016-08-26 NOTE — ED Notes (Signed)
Pt was wanded by security.  

## 2016-08-26 NOTE — H&P (Signed)
Behavioral Health Medical Screening Exam  Kirsten Harper is an 71 y.o. female.  Total Time spent with patient: 30 minutes  Psychiatric Specialty Exam: Physical Exam  Nursing note and vitals reviewed. Psychiatric: Her mood appears anxious. Her affect is labile.    Review of Systems  Constitutional: Negative.   HENT: Negative.   Eyes: Negative.   Cardiovascular: Negative.   Gastrointestinal: Negative.   Skin: Negative.   Psychiatric/Behavioral: The patient is nervous/anxious.     There were no vitals taken for this visit.There is no height or weight on file to calculate BMI.  General Appearance: Casual  Eye Contact:  Good  Speech:  Clear and Coherent  Volume:  Normal  Mood:  Anxious  Affect:  Appropriate, Congruent and Labile  Thought Process:  Coherent and Linear  Orientation:  Full (Time, Place, and Person)  Thought Content:  Logical  Suicidal Thoughts:  No  Homicidal Thoughts:  No  Memory:  Immediate;   Fair Recent;   Fair Remote;   Fair  Judgement:  Fair  Insight:  Fair  Psychomotor Activity:  Normal  Concentration: Concentration: Fair and Attention Span: Fair  Recall:  [employer] of Knowledge:Fair  Language: Fair  Akathisia:  No  Handed:  Right  AIMS (if indicated):     Assets:  Communication Skills Physical Health Resilience  Sleep:  poor    Musculoskeletal: Strength & Muscle Tone: within normal limits Gait & Station: normal Patient leans: N/A  98.3 109 18 144/83 96% RA  Recommendations:  Based on my evaluation the patient appears to have an emergency medical condition for which I recommend the patient be transferred to the emergency department for further evaluation.  Patient statest that she is a patient of Dr Casimiro Needle.  She is being tapered off Klonopin but walked in to [facility], anxious and seemingly forgetful;.. She is jittery and frustrated that Dr Casimiro Needle took her off the Klonopin and added 3 new medications./  We are sending to ED for me clearance  due to age 71 yo and possible gero psych placement.    Kirsten Labella, NP [facility] 08/26/2016, 3:19 PM

## 2016-08-26 NOTE — BH Assessment (Signed)
Tele Assessment Note  Pt's psychiatrist, [name], called and spoke with writer prior to pt's arrival. He reports pt becoming increasingly anxious and having difficulty functioning. He reports pt want to get off Klonopin completely.   Pt is cooperative and oriented to person, time and situation. She appears very anxious and preoccupied. Pt thinks she lost her pair of black eyeglasses. She obsesses over this and Probation officer watches while she looks through her bag and pocketbook. Then writer locks them in her locker again. Danny the security guard went over all the security video and there is no evidence pt had an extra pair of glasses when she arrived. Pt endorses severe anxiety and depressive symptoms including insomnia, unintended weight loss (20 lbs), anhedonia, lability and hopelessness. She reports passive SI and reports she doesn't want to live "and be alone." She denies plan or intent. Pt says, "I'm all alone in the world." Pt reports she doesn't want to bother her 12 yo mom who is in poor health. She reports no social support system. Pt says that she isn't leaving her house. She reports impairment in concentration and remote and recent memory. Pt says she is "losing function". Pt reports hx of substance abuse and inpatient treatment in 1992 and 1996 in [place]. Pt reports estranged from her 2 sons. Pt reports she is scared she will get addicted to [place]. Pt says her main concern is being able to stop Klonopin completely. Pt reports becoming fearful of things like winter and snow. Pt reports last xanax use last month. PT reports needing something to calm her nerves but she knows she can't take most meds b/c of addiction history. Pt denies homicidal thoughts or physical aggression. Pt denies having any legal problems at this time. Pt is calm and cooperative during assessment. Pt denies hallucinations. Pt does not appear to be responding to internal stimuli and exhibits no delusional thought. Pt's reality  testing appears to be intact.    Kirsten Harper is an 71 y.o. female.   Diagnosis: Major Depressive Disorder, Severe without Psychotic Features Unspecified Anxiety Disorder Alcohol Use Disorder, in partial remission Benzodiazepine Use Disorder  Past Medical History:  Past Medical History:  Diagnosis Date  . Ankylosing spondylitis ([place])   . Anxiety   . Arthritis   . Chronic lower back pain   . CKD (chronic kidney disease), stage II    "related to the Ankylosing spondylitis" (04/10/2016)  . COPD (chronic obstructive pulmonary disease) ([place])   . Eye disease   . Hypertension   . PONV (postoperative nausea and vomiting)    "didn't bother me the last time I had it in ~ 2015"    Past Surgical History:  Procedure Laterality Date  . BROW LIFT AND BLEPHAROPLASTY Bilateral ~ 2015  . CATARACT EXTRACTION W/ INTRAOCULAR LENS  IMPLANT, BILATERAL Bilateral 2000s  . MANDIBLE SURGERY  ~ 2000   "jaw had moved to left; broke my jaw in 5 places; put titanium in; stripped muscle from bone; wired jaw shut"  . TUBAL LIGATION  1979  . VAGINAL HYSTERECTOMY  1985    Family History:  Family History  Problem Relation Age of Onset  . Hypertension Mother   . Heart disease Sister   . Diabetes Sister   . Pulmonary fibrosis Brother   . Alzheimer's disease Brother     Social History:  reports that she has been smoking Cigarettes.  She has a 25.50 pack-year smoking history. She has never used smokeless tobacco. She reports that  she drinks about 0.6 oz of alcohol per week . She reports that she does not use drugs.  Additional Social History:  Alcohol / Drug Use Pain Medications: pt denies abuse - see pta meds list Prescriptions: pt denies abuse - see pta meds list Over the Counter: pt denies abuse - see pta meds list History of alcohol / drug use?: Yes Substance #1 Name of Substance 1: etoh 1 - Age of First Use: teenager 1 - Duration: has been in recovery off and on 1 - Last Use / Amount: had one  drink last night Substance #2 Name of Substance 2: xanax 2 - Age of First Use: unknown 2 - Last Use / Amount: one month ago  CIWA:   COWS:    PATIENT STRENGTHS: (choose at least two) Average or above average intelligence Communication skills Work skills  Allergies:  Allergies  Allergen Reactions  . Amoxapine And Related     Patient reports it did not work for her.     Home Medications:  (Not in a hospital admission)  OB/GYN Status:  No LMP recorded. Patient has had a hysterectomy.  General Assessment Data Location of Assessment: [facility] Assessment Services TTS Assessment: In system Is this a Tele or Face-to-Face Assessment?: Face-to-Face Is this an Initial Assessment or a Re-assessment for this encounter?: Initial Assessment Marital status: Divorced [place] name: harper Is patient pregnant?: No Pregnancy Status: No Living Arrangements: Alone, Parent (lives in garage apartment above elderly mom and stepdad) Can pt return to current living arrangement?: Yes Admission Status: Voluntary Is patient capable of signing voluntary admission?: Yes Referral Source:  (dr [name]) Insurance type: medicare  Medical Screening Exam ([place]) Medical Exam completed: Yes  Crisis Care Plan Living Arrangements: Alone, Parent (lives in garage apartment above elderly mom and stepdad) Name of Psychiatrist: Dr [name] Name of Therapist: none  Education Status Is patient currently in school?: No Highest grade of school patient has completed: 73 (college grad)  Risk to self with the past 6 months Suicidal Ideation: Yes-Currently Present Has patient been a risk to self within the past 6 months prior to admission? : No Suicidal Intent: No Has patient had any suicidal intent within the past 6 months prior to admission? : No Is patient at risk for suicide?: No Suicidal Plan?: No Has patient had any suicidal plan within the past 6 months prior to admission? :  No Access to Means: No What has been your use of drugs/alcohol within the last 12 months?: xanax and etoh occasionally Previous Attempts/Gestures: No How many times?: 0 Other Self Harm Risks: none Triggers for Past Attempts:  (n/a) Intentional Self Injurious Behavior: None Family Suicide History: Unknown (brother possibly killed himself by drug overdose) Recent stressful life event(s): Other (Comment), Recent negative physical changes, Financial Problems Persecutory voices/beliefs?: No Depression: Yes Depression Symptoms: Insomnia, Isolating, Fatigue, Loss of interest in usual pleasures, Feeling worthless/self pity, Despondent Substance abuse history and/or treatment for substance abuse?: Yes Suicide prevention information given to non-admitted patients: Not applicable  Risk to Others within the past 6 months Homicidal Ideation: No Does patient have any lifetime risk of violence toward others beyond the six months prior to admission? : No Thoughts of Harm to Others: No Current Homicidal Intent: No Current Homicidal Plan: No Access to Homicidal Means: No Identified Victim: none History of harm to others?: No Assessment of Violence: None Noted Violent Behavior Description: pt denies hx harm to others Does patient have access to weapons?: Yes (  Comment) (pt owns handgun for home defense) Criminal Charges Pending?: No Does patient have a court date: No Is patient on probation?: No  Psychosis Hallucinations: None noted Delusions: None noted  Mental Status Report Appearance/Hygiene: Unremarkable (in appropriate clothing) Eye Contact: Good Motor Activity: Freedom of movement, Restlessness, Agitation (legs bouncing up and down) Speech: Logical/coherent Level of Consciousness: Alert, Restless Mood: Depressed, Anxious, Sad, Irritable, Anhedonia Affect: Appropriate to circumstance, Labile, Preoccupied, Anxious, Apprehensive Anxiety Level: Severe Thought Processes: Relevant,  Coherent Judgement: Impaired Orientation: Person, Situation, Time Obsessive Compulsive Thoughts/Behaviors: Moderate  Cognitive Functioning Concentration: Decreased Memory: Remote Impaired, Recent Impaired IQ: Average Insight: Good Impulse Control: Fair Appetite: Good Weight Gain: 20 (since Oct 2017) Sleep: Decreased Total Hours of Sleep: 1 Vegetative Symptoms:  (staying in her apartment)  ADLScreening ([place]) Patient's cognitive ability adequate to safely complete daily activities?: Yes Patient able to express need for assistance with ADLs?: Yes Independently performs ADLs?: Yes (appropriate for developmental age)  Prior Inpatient Therapy Prior Inpatient Therapy: Yes Prior Therapy Dates: [place] Prior Therapy Facilty/Provider(s): [facility] both in [place] Reason for Treatment: substance abuse - etoh and xanax  Prior Outpatient Therapy Prior Outpatient Therapy: Yes Prior Therapy Dates: currently Prior Therapy Facilty/Provider(s): Dr [name] Reason for Treatment: depression Does patient have an ACCT team?: No Does patient have Intensive In-House Services?  : No Does patient have [facility] services? : No Does patient have P4CC services?: No  ADL Screening (condition at time of admission) Patient's cognitive ability adequate to safely complete daily activities?: Yes Is the patient deaf or have difficulty hearing?: No Does the patient have difficulty seeing, even when wearing glasses/contacts?: Yes Does the patient have difficulty concentrating, remembering, or making decisions?: Yes Patient able to express need for assistance with ADLs?: Yes Does the patient have difficulty dressing or bathing?: No Independently performs ADLs?: Yes (appropriate for developmental age) Does the patient have difficulty walking or climbing stairs?: No Weakness of Legs: None Weakness of Arms/Hands: None  Home Assistive Devices/Equipment Home Assistive  Devices/Equipment: Eyeglasses    Abuse/Neglect Assessment (Assessment to be complete while patient is alone) Physical Abuse: Denies Verbal Abuse: Denies Sexual Abuse: Denies Exploitation of patient/patient's resources: Denies Self-Neglect: Denies     Regulatory affairs officer (For Healthcare) Does Patient Have a Medical Advance Directive?: Yes Type of Advance Directive: Living will Copy of Living Will in Chart?: No - copy requested Would patient like information on creating a medical advance directive?: No - Patient declined    Additional Information 1:1 In Past 12 Months?: No CIRT Risk: No Elopement Risk: No Does patient have medical clearance?: No     Disposition:  Disposition Initial Assessment Completed for this Encounter: Yes Disposition of Patient: Inpatient treatment program Type of inpatient treatment program: Adult (may agustin recommends inpatient treatment)   Pt is to go to [facility] for medical clearance via [facility] [place] has been unable to contact [employer] so far.   Ysabella Babiarz P 08/26/2016 3:48 PM

## 2016-08-26 NOTE — ED Triage Notes (Signed)
Patient states that she went to [facility] for detox clonazepam/klonopin, patient states she is been taking it for couple years and wears off faster and faster and making her want more and more of it.  Patient last dose was 430am today.  Patient was sent here for medical clearance from [facility].  Patient states that she hasn't been able to get good sleep in a long time.  Patient responds, "I dont care if I live or not" when asked if she has thoughts of wanting to harm herself.

## 2016-08-26 NOTE — ED Provider Notes (Signed)
[facility] DEPT Provider Note   CSN: [id number] Arrival date & time: 08/26/16  1619     History   Chief Complaint Chief Complaint  Patient presents with  . Medical Clearance    detox from klonopin     HPI Kirsten Harper is a 71 y.o. female.  Patient is a 71 year old female with a history of ankylosing spondylitis, chronic kidney disease, COPD and hypertension who presents as a medical clearance from [facility]. She's been on chronic Klonopin. She is trying to wean herself off because she says it's making her crazy. She's been increasingly anxious. She's not functioning well. She's not sleeping well. Her last dose of Klonopin was at 4:30 this morning. She denies any definite suicidal ideations but says that I don't care if I live or not. She denies any alcohol use although she has a prior history of alcohol abuse. She denies any drug use. She denies any recent aspirin or Tylenol ingestion. She feels achy all over and a little anxious but denies any other physical complaints or recent illnesses. She does have chronic back pain but it's unchanged from baseline.      Past Medical History:  Diagnosis Date  . Ankylosing spondylitis ([place])   . Anxiety   . Arthritis   . Chronic lower back pain   . CKD (chronic kidney disease), stage II    "related to the Ankylosing spondylitis" (04/10/2016)  . COPD (chronic obstructive pulmonary disease) ([place])   . Eye disease   . Hypertension   . PONV (postoperative nausea and vomiting)    "didn't bother me the last time I had it in ~ 2015"    Patient Active Problem List   Diagnosis Date Noted  . Spondyloarthropathy HLA-B27 positive 06/06/2016  . Iritis 06/06/2016  . High risk medication use 06/06/2016  . Primary osteoarthritis of both hands 06/06/2016  . Smoker 06/06/2016  . Acute respiratory failure with hypoxia ([place]) 04/10/2016  . COPD exacerbation ([place]) 04/10/2016  . Anxiety 04/10/2016  . Depression 04/10/2016  . Essential hypertension      Past Surgical History:  Procedure Laterality Date  . BROW LIFT AND BLEPHAROPLASTY Bilateral ~ 2015  . CATARACT EXTRACTION W/ INTRAOCULAR LENS  IMPLANT, BILATERAL Bilateral 2000s  . MANDIBLE SURGERY  ~ 2000   "jaw had moved to left; broke my jaw in 5 places; put titanium in; stripped muscle from bone; wired jaw shut"  . TUBAL LIGATION  1979  . VAGINAL HYSTERECTOMY  1985    OB History    No data available       Home Medications    Prior to Admission medications   Medication Sig Start Date End Date Taking? Authorizing Provider  acetaminophen (TYLENOL) 325 MG tablet Take 2 tablets (650 mg total) by mouth every 6 (six) hours as needed for mild pain (or Fever >/= 101). 04/13/16   Kelvin Cellar, MD  albuterol (PROVENTIL HFA;VENTOLIN HFA) 108 (90 Base) MCG/ACT inhaler Inhale 2 puffs into the lungs every 4 (four) hours as needed for wheezing or shortness of breath (cough, shortness of breath or wheezing.). 04/13/16   Kelvin Cellar, MD  clonazePAM (KLONOPIN) 1 MG tablet Take 1.5 mg by mouth daily.    Historical Provider, MD  hydrOXYzine (ATARAX/VISTARIL) 25 MG tablet Take 25 mg by mouth 3 (three) times daily as needed for itching.    Historical Provider, MD  losartan (COZAAR) 100 MG tablet Take 100 mg by mouth daily.    Historical Provider, MD  sulfaSALAzine (AZULFIDINE)  500 MG tablet Take 500 mg by mouth daily as needed (arthritis).    Historical Provider, MD  sulindac (CLINORIL) 150 MG tablet Take 150 mg by mouth daily as needed.    Historical Provider, MD    Family History Family History  Problem Relation Age of Onset  . Hypertension Mother   . Heart disease Sister   . Diabetes Sister   . Pulmonary fibrosis Brother   . Alzheimer's disease Brother     Social History Social History  Substance Use Topics  . Smoking status: Current Every Day Smoker    Packs/day: 0.50    Years: 51.00    Types: Cigarettes  . Smokeless tobacco: Never Used  . Alcohol use 0.6 oz/week    1  Glasses of wine per week     Allergies   Amoxapine and related   Review of Systems Review of Systems  Constitutional: Positive for fatigue. Negative for chills, diaphoresis and fever.  HENT: Negative for congestion, rhinorrhea and sneezing.   Eyes: Negative.   Respiratory: Negative for cough, chest tightness and shortness of breath.   Cardiovascular: Negative for chest pain and leg swelling.  Gastrointestinal: Negative for abdominal pain, blood in stool, diarrhea, nausea and vomiting.  Genitourinary: Negative for difficulty urinating, flank pain, frequency and hematuria.  Musculoskeletal: Positive for back pain and myalgias. Negative for arthralgias.  Skin: Negative for rash.  Neurological: Negative for dizziness, speech difficulty, weakness, numbness and headaches.     Physical Exam Updated Vital Signs BP 138/83 (BP Location: Left Arm)   Pulse 98   Temp 99.1 F (37.3 C) (Oral)   Resp 19   SpO2 99%   Physical Exam  Constitutional: She is oriented to person, place, and time. She appears well-developed and well-nourished.  HENT:  Head: Normocephalic and atraumatic.  Eyes: Pupils are equal, round, and reactive to light.  Neck: Normal range of motion. Neck supple.  Cardiovascular: Normal rate, regular rhythm and normal heart sounds.   Pulmonary/Chest: Effort normal and breath sounds normal. No respiratory distress. She has no wheezes. She has no rales. She exhibits no tenderness.  Abdominal: Soft. Bowel sounds are normal. There is no tenderness. There is no rebound and no guarding.  Musculoskeletal: Normal range of motion. She exhibits no edema.  Lymphadenopathy:    She has no cervical adenopathy.  Neurological: She is alert and oriented to person, place, and time.  No visible tremor  Skin: Skin is warm and dry. No rash noted.  Psychiatric: She has a normal mood and affect.     ED Treatments / Results  Labs (all labs ordered are listed, but only abnormal results are  displayed) Labs Reviewed  COMPREHENSIVE METABOLIC PANEL - Abnormal; Notable for the following:       Result Value   Glucose, Bld 172 (*)    Total Bilirubin 1.3 (*)    GFR calc non Af Amer 58 (*)    All other components within normal limits  CBC - Abnormal; Notable for the following:    HCT 35.6 (*)    All other components within normal limits  RAPID URINE DRUG SCREEN, HOSP PERFORMED - Abnormal; Notable for the following:    Benzodiazepines POSITIVE (*)    All other components within normal limits  ETHANOL  ACETAMINOPHEN LEVEL  SALICYLATE LEVEL    EKG  EKG Interpretation None       Radiology No results found.  Procedures Procedures (including critical care time)  Medications Ordered in ED Medications  LORazepam (ATIVAN) tablet 1 mg (1 mg Oral Given 08/26/16 1954)     Initial Impression / Assessment and Plan / ED Course  I have reviewed the triage vital signs and the nursing notes.  Pertinent labs & imaging results that were available during my care of the patient were reviewed by me and considered in my medical decision making (see chart for details).     Patient presents with withdrawal symptoms. She is medically cleared and will attempt to transfer [facility]  Final Clinical Impressions(s) / ED Diagnoses   Final diagnoses:  Benzodiazepine dependence ([place])    New Prescriptions New Prescriptions   No medications on file     Malvin Johns, MD 08/26/16 2041

## 2016-08-27 DIAGNOSIS — F332 Major depressive disorder, recurrent severe without psychotic features: Secondary | ICD-10-CM

## 2016-08-27 DIAGNOSIS — Z79899 Other long term (current) drug therapy: Secondary | ICD-10-CM | POA: Diagnosis not present

## 2016-08-27 DIAGNOSIS — Z810 Family history of intellectual disabilities: Secondary | ICD-10-CM

## 2016-08-27 DIAGNOSIS — Z888 Allergy status to other drugs, medicaments and biological substances status: Secondary | ICD-10-CM

## 2016-08-27 DIAGNOSIS — F1320 Sedative, hypnotic or anxiolytic dependence, uncomplicated: Secondary | ICD-10-CM | POA: Diagnosis not present

## 2016-08-27 DIAGNOSIS — Z9851 Tubal ligation status: Secondary | ICD-10-CM

## 2016-08-27 DIAGNOSIS — F411 Generalized anxiety disorder: Secondary | ICD-10-CM

## 2016-08-27 DIAGNOSIS — F17210 Nicotine dependence, cigarettes, uncomplicated: Secondary | ICD-10-CM

## 2016-08-27 DIAGNOSIS — Z9889 Other specified postprocedural states: Secondary | ICD-10-CM

## 2016-08-27 DIAGNOSIS — Z9049 Acquired absence of other specified parts of digestive tract: Secondary | ICD-10-CM

## 2016-08-27 LAB — URINALYSIS, ROUTINE W REFLEX MICROSCOPIC
Bacteria, UA: NONE SEEN
Bilirubin Urine: NEGATIVE
Glucose, UA: NEGATIVE mg/dL
Hgb urine dipstick: NEGATIVE
Ketones, ur: NEGATIVE mg/dL
NITRITE: NEGATIVE
PH: 7 (ref 5.0–8.0)
Protein, ur: NEGATIVE mg/dL
Specific Gravity, Urine: 1.006 (ref 1.005–1.030)

## 2016-08-27 MED ORDER — LORAZEPAM 2 MG/ML IJ SOLN: 1.0000 mg | Freq: Four times a day (QID) | INTRAMUSCULAR | Status: DC | PRN

## 2016-08-27 MED ORDER — VITAMIN B-1 100 MG PO TABS
100.0000 mg | ORAL_TABLET | Freq: Every day | ORAL | Status: DC
  Administered 2016-08-27: 100 mg via ORAL
  Filled 2016-08-27: qty 1

## 2016-08-27 MED ORDER — THIAMINE HCL 100 MG/ML IJ SOLN: 100.0000 mg | Freq: Every day | INTRAMUSCULAR | Status: DC

## 2016-08-27 MED ORDER — ALBUTEROL SULFATE HFA 108 (90 BASE) MCG/ACT IN AERS: 2.0000 | INHALATION_SPRAY | RESPIRATORY_TRACT | Status: DC | PRN

## 2016-08-27 MED ORDER — FLUOROMETHOLONE 0.1 % OP SUSP
1.0000 [drp] | Freq: Three times a day (TID) | OPHTHALMIC | Status: DC
  Administered 2016-08-27 (×2): 1 [drp] via OPHTHALMIC
  Filled 2016-08-27: qty 5

## 2016-08-27 MED ORDER — LORAZEPAM 1 MG PO TABS
1.0000 mg | ORAL_TABLET | Freq: Four times a day (QID) | ORAL | Status: DC | PRN
  Administered 2016-08-27: 1 mg via ORAL
  Filled 2016-08-27 (×2): qty 1

## 2016-08-27 MED ORDER — ACETAMINOPHEN 325 MG PO TABS: 650.0000 mg | ORAL_TABLET | Freq: Four times a day (QID) | ORAL | Status: DC | PRN

## 2016-08-27 MED ORDER — HYDROXYZINE HCL 25 MG PO TABS
25.0000 mg | ORAL_TABLET | Freq: Three times a day (TID) | ORAL | Status: DC | PRN
  Filled 2016-08-27: qty 1

## 2016-08-27 MED ORDER — CLONAZEPAM 1 MG PO TABS
1.0000 mg | ORAL_TABLET | Freq: Once | ORAL | Status: AC
  Administered 2016-08-27: 1 mg via ORAL
  Filled 2016-08-27: qty 1

## 2016-08-27 MED ORDER — LORAZEPAM 1 MG PO TABS
0.0000 mg | ORAL_TABLET | Freq: Four times a day (QID) | ORAL | Status: DC
  Administered 2016-08-27 (×2): 1 mg via ORAL
  Administered 2016-08-28: 2 mg via ORAL
  Administered 2016-08-28: 1 mg via ORAL
  Filled 2016-08-27: qty 1
  Filled 2016-08-27: qty 2
  Filled 2016-08-27: qty 1

## 2016-08-27 MED ORDER — MIRTAZAPINE 30 MG PO TABS
45.0000 mg | ORAL_TABLET | Freq: Every day | ORAL | Status: DC
  Administered 2016-08-27: 45 mg via ORAL
  Filled 2016-08-27: qty 2

## 2016-08-27 MED ORDER — LOSARTAN POTASSIUM 50 MG PO TABS
100.0000 mg | ORAL_TABLET | Freq: Every day | ORAL | Status: DC
  Administered 2016-08-27: 100 mg via ORAL
  Filled 2016-08-27 (×2): qty 2

## 2016-08-27 MED ORDER — CLONAZEPAM 1 MG PO TABS
1.5000 mg | ORAL_TABLET | Freq: Every day | ORAL | Status: DC
  Administered 2016-08-27: 0.5 mg via ORAL
  Filled 2016-08-27: qty 1

## 2016-08-27 MED ORDER — ADULT MULTIVITAMIN W/MINERALS CH
1.0000 | ORAL_TABLET | Freq: Every day | ORAL | Status: DC
  Administered 2016-08-27: 1 via ORAL
  Filled 2016-08-27: qty 1

## 2016-08-27 MED ORDER — FOLIC ACID 1 MG PO TABS
1.0000 mg | ORAL_TABLET | Freq: Every day | ORAL | Status: DC
  Administered 2016-08-27: 1 mg via ORAL
  Filled 2016-08-27: qty 1

## 2016-08-27 MED ORDER — BUSPIRONE HCL 10 MG PO TABS
10.0000 mg | ORAL_TABLET | Freq: Two times a day (BID) | ORAL | Status: DC
  Administered 2016-08-27 (×2): 10 mg via ORAL
  Filled 2016-08-27 (×2): qty 1

## 2016-08-27 MED ORDER — LORAZEPAM 1 MG PO TABS: 0.0000 mg | ORAL_TABLET | Freq: Two times a day (BID) | ORAL | Status: DC

## 2016-08-27 NOTE — ED Notes (Signed)
Pt A&O x 3, no distress noted, presents for detox from Klonopin.  States she doesn't care if she lives or not.  Complaint of difficulty sleeping also.  Monitoring for safety, Q 15 min checks in effect.  Safety check for contraband completed, no items found.  Denies HI or AVH.

## 2016-08-27 NOTE — BH Assessment (Signed)
Received call from [employer] in reference to patient.  Patient is currently under review.   Rosalin Hawking, LCSW Therapeutic Triage Specialist [place] 08/27/2016 4:48 AM

## 2016-08-27 NOTE — BH Assessment (Signed)
Patient has been referred to the following facilities:  [facility],  [facility]   Rosalin Hawking, [place] Therapeutic Triage Specialist [place] 08/27/2016 2:06 AM

## 2016-08-27 NOTE — Progress Notes (Addendum)
CSW spoke with staff named Lauralee Evener from [employer] [facility]. Staff reports that patient has been accepted to facility by Dr. Raiford Simmonds after 7 am tomorrow 08/28/2016 and the number to call report is [phone]). Staff requested that CSW have patient fill out consent packet and fax it back to facility [phone]), CSW agreed.   CSW provided Triage Specialist staff(Thomas Ysidro Evert) with consent packet to assist patient with filling out. Staff agreed to fax consent packet.

## 2016-08-27 NOTE — Consult Note (Signed)
[facility] Face-to-Face Psychiatry Consult   Reason for Consult:  Depression, anxious, Benzodiazepine detox Referring Physician:  EDP Patient Identification: Kirsten Harper MRN:  [id number] Principal Diagnosis: Major depressive disorder, recurrent episode, severe ([place]) Diagnosis:   Patient Active Problem List   Diagnosis Date Noted  . Major depressive disorder, recurrent episode, severe ([place]) [F33.2] 08/27/2016    Priority: High  . GAD (generalized anxiety disorder) [F41.1] 08/27/2016    Priority: High  . 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 Iritis [H20.9] 06/06/2016  . High risk medication use [Z79.899] 06/06/2016  . Primary osteoarthritis of both hands [M19.041, M19.042] 06/06/2016  . Smoker [F17.200] 06/06/2016  . Acute respiratory failure with hypoxia ([place]) [J96.01] 04/10/2016  . COPD exacerbation ([place]) [J44.1] 04/10/2016  . Anxiety [F41.9] 04/10/2016  . Depression [F32.9] 04/10/2016  . Essential hypertension [I10]     Total Time spent with patient: 45 minutes  Subjective:   Kirsten Harper is a 71 y.o. female patient admitted with anxiety and depression   HPI:   Patient with history of MDD and  Sedative Hypnotics use disorder who was referred to the Hospital by her psychiatrist for detoxification of Benzodiazepine. Patient reports that "I am addicted to Clonazepam and I have been having difficulty getting off but I don't think anyone can help me. I am anxious, depressed, hopeless and unable to function since my doctor tried to wean me off.'' Patient denies suicidal thoughts but states ''I don't want live because all alone in the world, all I got is my 35 year old mother." Pt reports long history  of substance abuse and had  inpatient treatment admissions  in 1992 and 1996 in [place].   Past Psychiatric History: as above  Risk to Self: Is patient at risk for suicide?: No, but  patient needs Medical Clearance Risk to Others:   Prior Inpatient Therapy:   Prior Outpatient Therapy:    Past Medical History:  Past Medical History:  Diagnosis Date  . Ankylosing spondylitis ([place])   . Anxiety   . Arthritis   . Chronic lower back pain   . CKD (chronic kidney disease), stage II    "related to the Ankylosing spondylitis" (04/10/2016)  . COPD (chronic obstructive pulmonary disease) ([place])   . Eye disease   . Hypertension   . PONV (postoperative nausea and vomiting)    "didn't bother me the last time I had it in ~ 2015"    Past Surgical History:  Procedure Laterality Date  . BROW LIFT AND BLEPHAROPLASTY Bilateral ~ 2015  . CATARACT EXTRACTION W/ INTRAOCULAR LENS  IMPLANT, BILATERAL Bilateral 2000s  . MANDIBLE SURGERY  ~ 2000   "jaw had moved to left; broke my jaw in 5 places; put titanium in; stripped muscle from bone; wired jaw shut"  . TUBAL LIGATION  1979  . VAGINAL HYSTERECTOMY  1985   Family History:  Family History  Problem Relation Age of Onset  . Hypertension Mother   . Heart disease Sister   . Diabetes Sister   . Pulmonary fibrosis Brother   . Alzheimer's disease Brother    Family Psychiatric  History:   Social History:  History  Alcohol Use  . 0.6 oz/week  . 1 Glasses of wine per week     History  Drug Use No    Social History   Social History  . Marital status: Divorced    Spouse name:  N/A  . Number of children: N/A  . Years of education: N/A   Social History Main Topics  . Smoking status: Current Every Day Smoker    Packs/day: 0.50    Years: 51.00    Types: Cigarettes  . Smokeless tobacco: Never Used  . Alcohol use 0.6 oz/week    1 Glasses of wine per week  . Drug use: No  . Sexual activity: No   Other Topics Concern  . None   Social History Narrative  . None   Additional Social History:    Allergies:   Allergies  Allergen Reactions  . Amoxapine And Related     Patient reports it did not work for her.      Labs:  Results for orders placed or performed during the hospital encounter of 08/26/16 (from the past 48 hour(s))  Rapid urine drug screen (hospital performed)     Status: Abnormal   Collection Time: 08/26/16  4:45 PM  Result Value Ref Range   Opiates NONE DETECTED NONE DETECTED   Cocaine NONE DETECTED NONE DETECTED   Benzodiazepines POSITIVE (A) NONE DETECTED   Amphetamines NONE DETECTED NONE DETECTED   Tetrahydrocannabinol NONE DETECTED NONE DETECTED   Barbiturates NONE DETECTED NONE DETECTED    Comment:        DRUG SCREEN FOR MEDICAL PURPOSES ONLY.  IF CONFIRMATION IS NEEDED FOR ANY PURPOSE, NOTIFY LAB WITHIN 5 DAYS.        LOWEST DETECTABLE LIMITS FOR URINE DRUG SCREEN Drug Class       Cutoff (ng/mL) Amphetamine      1000 Barbiturate      200 Benzodiazepine   272 Tricyclics       536 Opiates          300 Cocaine          300 THC              50   Comprehensive metabolic panel     Status: Abnormal   Collection Time: 08/26/16  6:18 PM  Result Value Ref Range   Sodium 141 135 - 145 mmol/L   Potassium 3.8 3.5 - 5.1 mmol/L   Chloride 105 101 - 111 mmol/L   CO2 27 22 - 32 mmol/L   Glucose, Bld 172 (H) 65 - 99 mg/dL   BUN 13 6 - 20 mg/dL   Creatinine, Ser 0.98 0.44 - 1.00 mg/dL   Calcium 9.5 8.9 - 10.3 mg/dL   Total Protein 7.0 6.5 - 8.1 g/dL   Albumin 4.6 3.5 - 5.0 g/dL   AST 23 15 - 41 U/L   ALT 23 14 - 54 U/L   Alkaline Phosphatase 61 38 - 126 U/L   Total Bilirubin 1.3 (H) 0.3 - 1.2 mg/dL   GFR calc non Af Amer 58 (L) >60 mL/min   GFR calc Af Amer >60 >60 mL/min    Comment: (NOTE) The eGFR has been calculated using the CKD EPI equation. This calculation has not been validated in all clinical situations. eGFR's persistently <60 mL/min signify possible Chronic Kidney Disease.    Anion gap 9 5 - 15  Ethanol     Status: None   Collection Time: 08/26/16  6:18 PM  Result Value Ref Range   Alcohol, Ethyl (B) <5 <5 mg/dL    Comment:        LOWEST DETECTABLE  LIMIT FOR SERUM ALCOHOL IS 5 mg/dL FOR MEDICAL PURPOSES ONLY   cbc     Status: Abnormal  Collection Time: 08/26/16  6:18 PM  Result Value Ref Range   WBC 10.5 4.0 - 10.5 K/uL   RBC 4.01 3.87 - 5.11 MIL/uL   Hemoglobin 12.2 12.0 - 15.0 g/dL   HCT 35.6 (L) 36.0 - 46.0 %   MCV 88.8 78.0 - 100.0 fL   MCH 30.4 26.0 - 34.0 pg   MCHC 34.3 30.0 - 36.0 g/dL   RDW 15.2 11.5 - 15.5 %   Platelets 281 150 - 400 K/uL    Current Facility-Administered Medications  Medication Dose Route Frequency Provider Last Rate Last Dose  . acetaminophen (TYLENOL) tablet 650 mg  650 mg Oral Q6H PRN Orpah Greek, MD      . albuterol (PROVENTIL HFA;VENTOLIN HFA) 108 (90 Base) MCG/ACT inhaler 2 puff  2 puff Inhalation Q4H PRN Malvin Johns, MD      . clonazePAM (KLONOPIN) tablet 1.5 mg  1.5 mg Oral Daily Orpah Greek, MD   0.5 mg at 08/27/16 0935  . hydrOXYzine (ATARAX/VISTARIL) tablet 25 mg  25 mg Oral TID PRN Orpah Greek, MD      . losartan (COZAAR) tablet 100 mg  100 mg Oral Daily Malvin Johns, MD   100 mg at 08/27/16 0093   Current Outpatient Prescriptions  Medication Sig Dispense Refill  . albuterol (PROVENTIL HFA;VENTOLIN HFA) 108 (90 Base) MCG/ACT inhaler Inhale 2 puffs into the lungs every 4 (four) hours as needed for wheezing or shortness of breath (cough, shortness of breath or wheezing.). 1 Inhaler 1  . clonazePAM (KLONOPIN) 1 MG tablet Take 1.5 mg by mouth daily.    . fluorometholone (FML) 0.1 % ophthalmic suspension Place 1 drop into both eyes 3 (three) times daily.  5  . losartan (COZAAR) 100 MG tablet Take 100 mg by mouth daily.    . methotrexate (RHEUMATREX) 2.5 MG tablet Take 7.5 mg by mouth every 7 (seven) days.   3  . mirtazapine (REMERON) 45 MG tablet Take 45 mg by mouth at bedtime.    . sulfaSALAzine (AZULFIDINE) 500 MG tablet Take 500 mg by mouth daily as needed (arthritis).    . sulindac (CLINORIL) 150 MG tablet Take 150 mg by mouth daily as needed (arthiritis).        Musculoskeletal: Strength & Muscle Tone: within normal limits Gait & Station: normal Patient leans: N/A  Psychiatric Specialty Exam: Physical Exam  Psychiatric: Her speech is normal. Judgment normal. Her mood appears anxious. She is agitated. Cognition and memory are normal. She exhibits a depressed mood. She expresses suicidal ideation.    Review of Systems  Constitutional: Negative.   HENT: Negative.   Eyes: Negative.   Respiratory: Negative.   Cardiovascular: Negative.   Gastrointestinal: Negative.   Genitourinary: Negative.   Musculoskeletal: Negative.   Skin: Negative.   Neurological: Negative.   Endo/Heme/Allergies: Negative.   Psychiatric/Behavioral: Positive for depression and substance abuse. The patient is nervous/anxious.     Blood pressure 125/71, pulse 73, temperature 97.7 F (36.5 C), temperature source Oral, resp. rate 16, SpO2 96 %.There is no height or weight on file to calculate BMI.  General Appearance: Casual  Eye Contact:  Good  Speech:  Clear and Coherent  Volume:  Decreased  Mood:  Anxious, Depressed and Hopeless  Affect:  Constricted  Thought Process:  Coherent  Orientation:  Full (Time, Place, and Person)  Thought Content:  Logical  Suicidal Thoughts:  No  Homicidal Thoughts:  No  Memory:  Immediate;   Fair Recent;  Fair Remote;   Fair  Judgement:  Poor  Insight:  Shallow  Psychomotor Activity:  Decreased and Psychomotor Retardation  Concentration:  Concentration: Fair and Attention Span: Fair  Recall:  [employer] of Knowledge:  Fair  Language:  Good  Akathisia:  No  Handed:  Right  AIMS (if indicated):     Assets:  Communication Skills Desire for Improvement  ADL's:  Intact  Cognition:  WNL  Sleep:   poor     Treatment Plan Summary: Daily contact with patient to assess and evaluate symptoms and progress in treatment and Medication management  Start Lorazepam protocol for Benzodiazepine detox Continue Remeron 45 mg Qhs for  depression/Insomnia Start Buspar 10 mg BID for anxiety Disposition: Recommend psychiatric Inpatient admission when medically cleared.  Corena Pilgrim, MD 08/27/2016 10:28 AM

## 2016-08-27 NOTE — BH Assessment (Signed)
Jasmine called from [facility] requesting labs from patient and urinalysis. Informed incoming shift of request.   Rosalin Hawking, LCSW Therapeutic Triage Specialist [place] 08/27/2016 7:16 AM

## 2016-08-28 DIAGNOSIS — F1320 Sedative, hypnotic or anxiolytic dependence, uncomplicated: Secondary | ICD-10-CM | POA: Diagnosis not present

## 2016-08-28 NOTE — Discharge Instructions (Signed)
You requested to be discharged. See the above resources for further evaluation if you choose to seek detox again. Benzodiazepine withdrawal can be life-threatening and you should not stop cold [place].

## 2016-08-28 NOTE — BH Assessment (Signed)
Informed by patients nurse that patient requested discharge and EDP has discharged patient.  Contacted [facility] at [phone] and spoke with Jonelle Sidle to inform her that patient will no longer need a bed at this time. Tiffany states that she will take patient off of the list for admissions today.   Rosalin Hawking, LCSW Therapeutic Triage Specialist [place] 08/28/2016 6:15 AM

## 2016-08-28 NOTE — ED Notes (Signed)
Discharge by Dr Dina Rich cancelled, pt states she wants to stay and speak with Psychiatrist in reference to prescriptions for home(Antidepressant).  Pt continues to state she refuses to be admitted to [facility].  TTS [facility] notified.

## 2016-08-28 NOTE — ED Notes (Signed)
Per EDP d/c pt. All items returned. D/C instructions given. Pt denies si and hi. Pt refused to go to f/u with [facility]. Gave follow up appts for April. Informed pt to return to hospital with withdrawals.

## 2016-08-28 NOTE — ED Notes (Signed)
Pt states she will stay to take her dose of Ativan per CIWA protocol.

## 2016-08-28 NOTE — ED Provider Notes (Addendum)
Received a call that the patient is requesting discharge. She is here voluntarily for Klonopin detox. I have reviewed her chart. No SI or HI. She is requesting discharge so she can go to an engagement. I gave her resources at discharge and precautions regarding benzodiazepine withdrawal.   Merryl Hacker, MD 08/28/16 0609  6:56 AM Patient requesting antidepressant at discharge. Discussed with nursing that patient is free to stay for psychiatric evaluation and evaluation for antidepressant; however, if she chooses to leave she will be given resources. Patient now states that she does not want to leave. Original plan in place.   Merryl Hacker, MD 08/28/16 [phone]

## 2016-08-30 NOTE — Telephone Encounter (Signed)
Ok to refill 

## 2016-09-03 DIAGNOSIS — F332 Major depressive disorder, recurrent severe without psychotic features: Secondary | ICD-10-CM | POA: Diagnosis not present

## 2016-09-04 DIAGNOSIS — F332 Major depressive disorder, recurrent severe without psychotic features: Secondary | ICD-10-CM | POA: Diagnosis not present

## 2016-09-05 DIAGNOSIS — F332 Major depressive disorder, recurrent severe without psychotic features: Secondary | ICD-10-CM | POA: Diagnosis not present

## 2016-09-06 DIAGNOSIS — F332 Major depressive disorder, recurrent severe without psychotic features: Secondary | ICD-10-CM | POA: Diagnosis not present

## 2016-09-07 DIAGNOSIS — F332 Major depressive disorder, recurrent severe without psychotic features: Secondary | ICD-10-CM | POA: Diagnosis not present

## 2016-09-09 DIAGNOSIS — F332 Major depressive disorder, recurrent severe without psychotic features: Secondary | ICD-10-CM | POA: Diagnosis not present

## 2016-09-10 DIAGNOSIS — F332 Major depressive disorder, recurrent severe without psychotic features: Secondary | ICD-10-CM | POA: Diagnosis not present

## 2016-09-11 DIAGNOSIS — F332 Major depressive disorder, recurrent severe without psychotic features: Secondary | ICD-10-CM | POA: Diagnosis not present

## 2016-09-19 DIAGNOSIS — F411 Generalized anxiety disorder: Secondary | ICD-10-CM | POA: Diagnosis not present

## 2016-09-19 DIAGNOSIS — R110 Nausea: Secondary | ICD-10-CM | POA: Diagnosis not present

## 2016-09-19 DIAGNOSIS — F39 Unspecified mood [affective] disorder: Secondary | ICD-10-CM | POA: Diagnosis not present

## 2016-09-19 DIAGNOSIS — R0789 Other chest pain: Secondary | ICD-10-CM | POA: Diagnosis not present

## 2016-09-21 ENCOUNTER — Observation Stay (HOSPITAL_BASED_OUTPATIENT_CLINIC_OR_DEPARTMENT_OTHER)
Admission: EM | Admit: 2016-09-21 | Discharge: 2016-09-22 | Disposition: A | Discharge status: Home / Self Care | Payer: Medicare Other | Source: Home / Self Care | Attending: Emergency Medicine | Admitting: Emergency Medicine

## 2016-09-21 ENCOUNTER — Emergency Department (HOSPITAL_COMMUNITY): Payer: Medicare Other

## 2016-09-21 ENCOUNTER — Encounter (HOSPITAL_COMMUNITY): Payer: Self-pay | Admitting: Emergency Medicine

## 2016-09-21 DIAGNOSIS — R05 Cough: Secondary | ICD-10-CM | POA: Diagnosis not present

## 2016-09-21 DIAGNOSIS — I10 Essential (primary) hypertension: Secondary | ICD-10-CM | POA: Diagnosis present

## 2016-09-21 DIAGNOSIS — F332 Major depressive disorder, recurrent severe without psychotic features: Secondary | ICD-10-CM | POA: Diagnosis present

## 2016-09-21 DIAGNOSIS — Z888 Allergy status to other drugs, medicaments and biological substances status: Secondary | ICD-10-CM

## 2016-09-21 DIAGNOSIS — F419 Anxiety disorder, unspecified: Secondary | ICD-10-CM | POA: Diagnosis present

## 2016-09-21 DIAGNOSIS — F411 Generalized anxiety disorder: Secondary | ICD-10-CM

## 2016-09-21 DIAGNOSIS — J441 Chronic obstructive pulmonary disease with (acute) exacerbation: Secondary | ICD-10-CM | POA: Diagnosis not present

## 2016-09-21 DIAGNOSIS — J189 Pneumonia, unspecified organism: Secondary | ICD-10-CM | POA: Diagnosis not present

## 2016-09-21 DIAGNOSIS — M457 Ankylosing spondylitis of lumbosacral region: Secondary | ICD-10-CM | POA: Diagnosis not present

## 2016-09-21 DIAGNOSIS — M459 Ankylosing spondylitis of unspecified sites in spine: Secondary | ICD-10-CM

## 2016-09-21 DIAGNOSIS — J440 Chronic obstructive pulmonary disease with acute lower respiratory infection: Secondary | ICD-10-CM | POA: Insufficient documentation

## 2016-09-21 DIAGNOSIS — Z79899 Other long term (current) drug therapy: Secondary | ICD-10-CM

## 2016-09-21 DIAGNOSIS — R002 Palpitations: Secondary | ICD-10-CM | POA: Diagnosis not present

## 2016-09-21 DIAGNOSIS — F17210 Nicotine dependence, cigarettes, uncomplicated: Secondary | ICD-10-CM

## 2016-09-21 DIAGNOSIS — J209 Acute bronchitis, unspecified: Secondary | ICD-10-CM | POA: Insufficient documentation

## 2016-09-21 DIAGNOSIS — Z7951 Long term (current) use of inhaled steroids: Secondary | ICD-10-CM

## 2016-09-21 DIAGNOSIS — F17200 Nicotine dependence, unspecified, uncomplicated: Secondary | ICD-10-CM | POA: Diagnosis present

## 2016-09-21 DIAGNOSIS — R000 Tachycardia, unspecified: Secondary | ICD-10-CM | POA: Diagnosis not present

## 2016-09-21 DIAGNOSIS — I1310 Hypertensive heart and chronic kidney disease without heart failure, with stage 1 through stage 4 chronic kidney disease, or unspecified chronic kidney disease: Secondary | ICD-10-CM

## 2016-09-21 DIAGNOSIS — N182 Chronic kidney disease, stage 2 (mild): Secondary | ICD-10-CM | POA: Insufficient documentation

## 2016-09-21 LAB — CBC
HEMATOCRIT: 38.7 % (ref 36.0–46.0)
Hemoglobin: 13.4 g/dL (ref 12.0–15.0)
MCH: 31.1 pg (ref 26.0–34.0)
MCHC: 34.6 g/dL (ref 30.0–36.0)
MCV: 89.8 fL (ref 78.0–100.0)
PLATELETS: 175 10*3/uL (ref 150–400)
RBC: 4.31 MIL/uL (ref 3.87–5.11)
RDW: 14.1 % (ref 11.5–15.5)
WBC: 5.2 10*3/uL (ref 4.0–10.5)

## 2016-09-21 LAB — BASIC METABOLIC PANEL
Anion gap: 9 (ref 5–15)
BUN: 11 mg/dL (ref 6–20)
CHLORIDE: 106 mmol/L (ref 101–111)
CO2: 24 mmol/L (ref 22–32)
Calcium: 9.3 mg/dL (ref 8.9–10.3)
Creatinine, Ser: 0.79 mg/dL (ref 0.44–1.00)
GFR calc non Af Amer: >60 mL/min (ref >60)
Glucose, Bld: 158 mg/dL — ABNORMAL HIGH (ref 65–99)
Potassium: 3.5 mmol/L (ref 3.5–5.1)
Sodium: 139 mmol/L (ref 135–145)

## 2016-09-21 LAB — I-STAT TROPONIN, ED: Troponin i, poc: 0 ng/mL (ref 0.00–0.08)

## 2016-09-21 MED ORDER — LOSARTAN POTASSIUM 50 MG PO TABS
100.0000 mg | ORAL_TABLET | Freq: Every day | ORAL | Status: DC
  Administered 2016-09-22: 100 mg via ORAL
  Filled 2016-09-21: qty 2

## 2016-09-21 MED ORDER — ONDANSETRON HCL 4 MG/2ML IJ SOLN: 4.0000 mg | Freq: Four times a day (QID) | INTRAMUSCULAR | Status: DC | PRN

## 2016-09-21 MED ORDER — FLUOROMETHOLONE 0.1 % OP SUSP
1.0000 [drp] | Freq: Three times a day (TID) | OPHTHALMIC | Status: DC
  Administered 2016-09-21 – 2016-09-22 (×3): 1 [drp] via OPHTHALMIC
  Filled 2016-09-21: qty 5

## 2016-09-21 MED ORDER — SODIUM CHLORIDE 0.9% FLUSH
3.0000 mL | Freq: Two times a day (BID) | INTRAVENOUS | Status: DC
  Administered 2016-09-21: 3 mL via INTRAVENOUS

## 2016-09-21 MED ORDER — ONDANSETRON HCL 4 MG PO TABS: 4.0000 mg | ORAL_TABLET | Freq: Four times a day (QID) | ORAL | Status: DC | PRN

## 2016-09-21 MED ORDER — IPRATROPIUM-ALBUTEROL 0.5-2.5 (3) MG/3ML IN SOLN
3.0000 mL | Freq: Four times a day (QID) | RESPIRATORY_TRACT | Status: DC
  Administered 2016-09-21: 3 mL via RESPIRATORY_TRACT
  Filled 2016-09-21: qty 3

## 2016-09-21 MED ORDER — CLONAZEPAM 0.5 MG PO TABS: 1.5000 mg | ORAL_TABLET | Freq: Every evening | ORAL | Status: DC | PRN

## 2016-09-21 MED ORDER — ALBUTEROL (5 MG/ML) CONTINUOUS INHALATION SOLN
10.0000 mg/h | INHALATION_SOLUTION | Freq: Once | RESPIRATORY_TRACT | Status: AC
  Administered 2016-09-21: 10 mg/h via RESPIRATORY_TRACT
  Filled 2016-09-21: qty 20

## 2016-09-21 MED ORDER — SENNOSIDES-DOCUSATE SODIUM 8.6-50 MG PO TABS: 1.0000 | ORAL_TABLET | Freq: Every evening | ORAL | Status: DC | PRN

## 2016-09-21 MED ORDER — PREDNISONE 20 MG PO TABS
40.0000 mg | ORAL_TABLET | Freq: Once | ORAL | Status: AC
  Administered 2016-09-21: 40 mg via ORAL
  Filled 2016-09-21: qty 2

## 2016-09-21 MED ORDER — MIRTAZAPINE 45 MG PO TABS
45.0000 mg | ORAL_TABLET | Freq: Every day | ORAL | Status: DC
  Administered 2016-09-21: 45 mg via ORAL
  Filled 2016-09-21: qty 3
  Filled 2016-09-21: qty 1

## 2016-09-21 MED ORDER — HYDROXYZINE HCL 10 MG PO TABS
10.0000 mg | ORAL_TABLET | Freq: Three times a day (TID) | ORAL | Status: DC | PRN
  Filled 2016-09-21 (×2): qty 1

## 2016-09-21 MED ORDER — IPRATROPIUM-ALBUTEROL 0.5-2.5 (3) MG/3ML IN SOLN
3.0000 mL | Freq: Once | RESPIRATORY_TRACT | Status: AC
  Administered 2016-09-21: 3 mL via RESPIRATORY_TRACT
  Filled 2016-09-21: qty 3

## 2016-09-21 MED ORDER — ALBUTEROL SULFATE (2.5 MG/3ML) 0.083% IN NEBU: 2.5000 mg | INHALATION_SOLUTION | RESPIRATORY_TRACT | Status: DC | PRN

## 2016-09-21 MED ORDER — PREDNISONE 20 MG PO TABS
40.0000 mg | ORAL_TABLET | Freq: Every day | ORAL | Status: DC
  Administered 2016-09-22: 40 mg via ORAL
  Filled 2016-09-21: qty 2

## 2016-09-21 MED ORDER — NICOTINE 14 MG/24HR TD PT24
14.0000 mg | MEDICATED_PATCH | Freq: Every day | TRANSDERMAL | Status: DC
  Administered 2016-09-21 – 2016-09-22 (×2): 14 mg via TRANSDERMAL
  Filled 2016-09-21 (×2): qty 1

## 2016-09-21 MED ORDER — SULINDAC 150 MG PO TABS
150.0000 mg | ORAL_TABLET | Freq: Every day | ORAL | Status: DC | PRN
  Filled 2016-09-21: qty 1

## 2016-09-21 MED ORDER — ENOXAPARIN SODIUM 40 MG/0.4ML ~~LOC~~ SOLN
40.0000 mg | SUBCUTANEOUS | Status: DC
  Administered 2016-09-22: 40 mg via SUBCUTANEOUS
  Filled 2016-09-21: qty 0.4

## 2016-09-21 NOTE — ED Notes (Signed)
ED Provider at bedside. 

## 2016-09-21 NOTE — ED Notes (Signed)
RT at at bedside.

## 2016-09-21 NOTE — ED Notes (Signed)
CAT going at this time.

## 2016-09-21 NOTE — Progress Notes (Signed)
NURSING PROGRESS NOTE  Kirsten Harper [id number] Transfer Data: 09/21/2016 5:03 PM Attending Provider: Maren Reamer, MD EXN:[id number] Theda Sers, MD Code Status: full   Kirsten Harper is a 71 y.o. female patient transferred from Emergency room -No acute distress noted.  -No complaints of shortness of breath.  -No complaints of chest pain.   Cardiac Monitoring: Box # 09 in place. Cardiac monitor yields:Sinus tach 103  Blood pressure (!) 143/56, pulse 94, temperature 99.3 F (37.4 C), temperature source Oral, resp. rate (!) 21, height 5' 7.5" (1.715 m), weight 75.8 kg (167 lb 1.6 oz), SpO2 96 %.   IV Fluids:  IV in place, occlusive dsg intact without redness, IV cath  Left upper arm and saline locked   Allergies:  Amoxapine and related  Past Medical History:   has a past medical history of Ankylosing spondylitis ([place]); Anxiety; Arthritis; Chronic lower back pain; CKD (chronic kidney disease), stage II; COPD (chronic obstructive pulmonary disease) ([place]); Eye disease; Hypertension; and PONV (postoperative nausea and vomiting).  Past Surgical History:   has a past surgical history that includes Cataract extraction w/ intraocular lens  implant, bilateral (Bilateral, 2000s); Vaginal hysterectomy (1985); Tubal ligation (1979); Brow lift and blepharoplasty (Bilateral, ~ 2015); and Mandible surgery (~ 2000).  Social History:   reports that she has been smoking Cigarettes.  She has a 25.50 pack-year smoking history. She has never used smokeless tobacco. She reports that she drinks about 0.6 oz of alcohol per week . She reports that she does not use drugs.  Skin: intact  Patient/Family orientated to room. Information packet given to patient/family. Admission inpatient armband information verified with patient/family to include name and date of birth and placed on patient arm. Side rails up x 2, fall assessment and education completed with patient/family. Patient/family able to verbalize  understanding of risk associated with falls and verbalized understanding to call for assistance before getting out of bed. Call light within reach. Patient/family able to voice and demonstrate understanding of unit orientation instructions.    Will continue to evaluate and treat per MD orders.

## 2016-09-21 NOTE — ED Provider Notes (Addendum)
[facility] DEPT Provider Note   CSN: [id number] Arrival date & time: 09/21/16  1003     History   Chief Complaint Chief Complaint  Patient presents with  . Palpitations  . Wheezing  . Shortness of Breath    HPI Kirsten Harper is a 71 y.o. female.Patient reports that she's been wheezing and complaining of "heart pounding" for the past 3 days. Denies chest pain She is also had a cough productive of yellowish sputum. She thinks symptoms started after swallowing and choking. She denies fever. Treated herself with albuterol without relief. No other associated symptoms. Nothing makes symptoms better or worse.  HPI  Past Medical History:  Diagnosis Date  . Ankylosing spondylitis ([place])   . Anxiety   . Arthritis   . Chronic lower back pain   . CKD (chronic kidney disease), stage II    "related to the Ankylosing spondylitis" (04/10/2016)  . COPD (chronic obstructive pulmonary disease) ([place])   . Eye disease   . Hypertension   . PONV (postoperative nausea and vomiting)    "didn't bother me the last time I had it in ~ 2015"    Patient Active Problem List   Diagnosis Date Noted  . Major depressive disorder, recurrent severe without psychotic features ([place]) 08/27/2016  . GAD (generalized anxiety disorder) 08/27/2016  . 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 Spondyloarthropathy HLA-B27 positive 06/06/2016  . Iritis 06/06/2016  . High risk medication use 06/06/2016  . Primary osteoarthritis of both hands 06/06/2016  . Smoker 06/06/2016  . Acute respiratory failure with hypoxia ([place]) 04/10/2016  . COPD exacerbation ([place]) 04/10/2016  . Anxiety 04/10/2016  . Essential hypertension     Past Surgical History:  Procedure Laterality Date  . BROW LIFT AND BLEPHAROPLASTY Bilateral ~ 2015  . CATARACT EXTRACTION W/ INTRAOCULAR LENS  IMPLANT, BILATERAL Bilateral 2000s  . MANDIBLE SURGERY  ~ 2000   "jaw had moved to left; broke my jaw in 5 places; put titanium  in; stripped muscle from bone; wired jaw shut"  . TUBAL LIGATION  1979  . VAGINAL HYSTERECTOMY  1985    OB History    No data available       Home Medications    Prior to Admission medications   Medication Sig Start Date End Date Taking? Authorizing Provider  albuterol (PROVENTIL HFA;VENTOLIN HFA) 108 (90 Base) MCG/ACT inhaler Inhale 2 puffs into the lungs every 4 (four) hours as needed for wheezing or shortness of breath (cough, shortness of breath or wheezing.). 04/13/16   Kelvin Cellar, MD  clonazePAM (KLONOPIN) 1 MG tablet Take 1.5 mg by mouth daily.    Historical Provider, MD  fluorometholone (FML) 0.1 % ophthalmic suspension Place 1 drop into both eyes 3 (three) times daily. 08/13/16   Historical Provider, MD  losartan (COZAAR) 100 MG tablet Take 100 mg by mouth daily.    Historical Provider, MD  methotrexate (RHEUMATREX) 2.5 MG tablet Take 7.5 mg by mouth every 7 (seven) days.  07/16/16   Historical Provider, MD  mirtazapine (REMERON) 45 MG tablet Take 45 mg by mouth at bedtime. 08/19/16   Historical Provider, MD  sulfaSALAzine (AZULFIDINE) 500 MG EC tablet TAKE 2 TABLETS BY MOUTH TWICE A DAY 08/30/16   Naitik Panwala, PA-C  sulfaSALAzine (AZULFIDINE) 500 MG tablet Take 500 mg by mouth daily as needed (arthritis).    Historical Provider, MD  sulindac (CLINORIL) 150 MG tablet Take 150 mg by mouth daily as needed (arthiritis).  Historical Provider, MD    Family History Family History  Problem Relation Age of Onset  . Hypertension Mother   . Heart disease Sister   . Diabetes Sister   . Pulmonary fibrosis Brother   . Alzheimer's disease Brother     Social History Social History  Substance Use Topics  . Smoking status: Current Every Day Smoker    Packs/day: 0.50    Years: 51.00    Types: Cigarettes  . Smokeless tobacco: Never Used  . Alcohol use 0.6 oz/week    1 Glasses of wine per week   Patient reports she quit smoking one week ago and denies illicit drug  use  Allergies   Amoxapine and related   Review of Systems Review of Systems  Constitutional: Negative.   HENT: Negative.   Respiratory: Positive for cough and wheezing.   Cardiovascular: Positive for palpitations.  Gastrointestinal: Negative.   Musculoskeletal: Negative.   Skin: Negative.   Neurological: Negative.   Psychiatric/Behavioral: Negative.   All other systems reviewed and are negative.    Physical Exam Updated Vital Signs BP 152/79 (BP Location: Left Arm)   Pulse 98   Temp 98.2 F (36.8 C)   Resp 17   Ht 5' 7.5" (1.715 m)   Wt 167 lb (75.8 kg)   SpO2 98%   BMI 25.77 kg/m   Physical Exam  Constitutional: She is oriented to person, place, and time. She appears well-developed and well-nourished. No distress.  HENT:  Head: Normocephalic and atraumatic.  Eyes: Conjunctivae are normal. Pupils are equal, round, and reactive to light.  Neck: Neck supple. No tracheal deviation present. No thyromegaly present.  Cardiovascular: Normal rate and regular rhythm.   No murmur heard. Pulmonary/Chest: Effort normal. No respiratory distress.  Diffuse rhonchi. Frequent coughing  Abdominal: Soft. Bowel sounds are normal. She exhibits no distension. There is no tenderness.  Musculoskeletal: Normal range of motion. She exhibits no edema or tenderness.  Neurological: She is alert and oriented to person, place, and time. Coordination normal.  Skin: Skin is warm and dry. No rash noted.  Psychiatric: She has a normal mood and affect.  Nursing note and vitals reviewed.    ED Treatments / Results  Labs (all labs ordered are listed, but only abnormal results are displayed) Labs Reviewed  BASIC METABOLIC PANEL  CBC  I-STAT [place], ED    EKG  EKG Interpretation  Date/Time:  Saturday September 21 2016 10:20:03 EST Ventricular Rate:  96 PR Interval:  124 QRS Duration: 82 QT Interval:  372 QTC Calculation: 469 R Axis:   75 Text Interpretation:  Normal sinus rhythm  Biatrial enlargement Nonspecific ST abnormality Abnormal ECG No significant change since last tracing Reconfirmed by Winfred Leeds  MD, Ronetta Molla [phone]) on 09/21/2016 10:45:48 AM      Chest x-ray viewed by me Results for orders placed or performed during the hospital encounter of 35/32/99  Basic metabolic panel  Result Value Ref Range   Sodium 139 135 - 145 mmol/L   Potassium 3.5 3.5 - 5.1 mmol/L   Chloride 106 101 - 111 mmol/L   CO2 24 22 - 32 mmol/L   Glucose, Bld 158 (H) 65 - 99 mg/dL   BUN 11 6 - 20 mg/dL   Creatinine, Ser 0.79 0.44 - 1.00 mg/dL   Calcium 9.3 8.9 - 10.3 mg/dL   GFR calc non Af Amer >60 >60 mL/min   GFR calc Af Amer >60 >60 mL/min   Anion gap 9 5 - 15  CBC  Result Value Ref Range   WBC 5.2 4.0 - 10.5 K/uL   RBC 4.31 3.87 - 5.11 MIL/uL   Hemoglobin 13.4 12.0 - 15.0 g/dL   HCT 38.7 36.0 - 46.0 %   MCV 89.8 78.0 - 100.0 fL   MCH 31.1 26.0 - 34.0 pg   MCHC 34.6 30.0 - 36.0 g/dL   RDW 14.1 11.5 - 15.5 %   Platelets 175 150 - 400 K/uL  I-stat troponin, ED  Result Value Ref Range   Troponin i, poc 0.00 0.00 - 0.08 ng/mL   Comment 3           Dg Chest 2 View  Result Date: 09/21/2016 CLINICAL DATA:  Cardiac palpitations EXAM: CHEST  2 VIEW COMPARISON:  08/26/16 FINDINGS: The heart size and mediastinal contours are within normal limits. Both lungs are clear. The visualized skeletal structures are unremarkable. IMPRESSION: No active cardiopulmonary disease. Electronically Signed   By: Inez Catalina M.D.   On: 09/21/2016 11:12   Dg Chest 2 View  Result Date: 08/26/2016 CLINICAL DATA:  Patient feels achy all over. Altered mental status. History of hypertension and COPD. EXAM: CHEST  2 VIEW COMPARISON:  04/10/2016 FINDINGS: The heart size and mediastinal contours are within normal limits. Both lungs are clear. The visualized skeletal structures are unremarkable. IMPRESSION: No active cardiopulmonary disease. Electronically Signed   By: Lucienne Capers M.D.   On: 08/26/2016 23:24    Radiology No results found.  Procedures Procedures (including critical care time)  Medications Ordered in ED Medications  ipratropium-albuterol (DUONEB) 0.5-2.5 (3) MG/3ML nebulizer solution 3 mL (not administered)     Initial Impression / Assessment and Plan / ED Course  I have reviewed the triage vital signs and the nursing notes.  Pertinent labs & imaging results that were available during my care of the patient were reviewed by me and considered in my medical decision making (see chart for details).     11:20 AM after DuoNeb treatment states breathing is improved however she is still wheezing. On repeat lung exam she speaks in full paragraphs. No respiratory distress. Lungs with diffuse rhonchi.  2:05 p.m. reports breathing improved after treatment with one hour of continuous nebulization and oral prednisone. On repeat exam she speaks in paragraphs lungs with diffuse rhonchi. No respiratory distress. Dr Fredda Hammed from hospital service consulted and will arrange for 23 hour observation telemetry, suggest patient needs further nebulized treatments. Final Clinical Impressions(s) / ED Diagnoses  Dx #1 exacerbation of COPD #2hyperglycemia Final diagnoses:  None    New Prescriptions New Prescriptions   No medications on file     Orlie Dakin, MD 09/21/16 [place], MD 09/21/16 1523

## 2016-09-21 NOTE — ED Notes (Signed)
Patient transported to X-ray 

## 2016-09-21 NOTE — ED Notes (Signed)
Report given to 5W - 

## 2016-09-21 NOTE — H&P (Addendum)
[facility] History and Physical  Kirsten Harper BTD:[id number] DOB: 10-02-1945 DOA: 09/21/2016  Referring physician: ED MD PCP: Kirsten Casco, MD   Chief Complaint: wheezing  HPI: Kirsten Harper is a 71 y.o. female with history of COPD, chronic kidney disease stage II to hypertension, anxiety, depression and history of ankylosing spondylolysis, presents to our ER secondary to wheezing for the last 3 days and complaints of "heart pounding".  Of note, she last smoked cigarettes (quantified as "just a few") 4 days ago. Denies seasonal allergies, rhinitis, sick contacts, productive cough, f/c/n/v/d/chest pain.   Pt c/o of hunger and tremors after nebs treatment, and currently eating during my exam.  Denies etoh.  In Ed, pt treated w/ o2, duonebs with mild improvement, than 1 hr nebs treatment.    Review of Systems:  Per hpi, o/w all systems reviewed and negative.  Past Medical History:  Diagnosis Date  . Ankylosing spondylitis ([place])   . Anxiety   . Arthritis   . Chronic lower back pain   . CKD (chronic kidney disease), stage II    "related to the Ankylosing spondylitis" (04/10/2016)  . COPD (chronic obstructive pulmonary disease) ([place])   . Eye disease   . Hypertension   . PONV (postoperative nausea and vomiting)    "didn't bother me the last time I had it in ~ 2015"   Past Surgical History:  Procedure Laterality Date  . BROW LIFT AND BLEPHAROPLASTY Bilateral ~ 2015  . CATARACT EXTRACTION W/ INTRAOCULAR LENS  IMPLANT, BILATERAL Bilateral 2000s  . MANDIBLE SURGERY  ~ 2000   "jaw had moved to left; broke my jaw in 5 places; put titanium in; stripped muscle from bone; wired jaw shut"  . TUBAL LIGATION  1979  . VAGINAL HYSTERECTOMY  1985   Social History:  reports that she has been smoking Cigarettes.  She has a 25.50 pack-year smoking history. She has never used smokeless tobacco. She reports that she drinks about 0.6 oz of alcohol per week . She reports that she does  not use drugs.  Allergies  Allergen Reactions  . Amoxapine And Related     Patient reports it did not work for her.     Family History  Problem Relation Age of Onset  . Hypertension Mother   . Heart disease Sister   . Diabetes Sister   . Pulmonary fibrosis Brother   . Alzheimer's disease Brother     Prior to Admission medications   Medication Sig Start Date End Date Taking? Authorizing Provider  albuterol (PROVENTIL HFA;VENTOLIN HFA) 108 (90 Base) MCG/ACT inhaler Inhale 2 puffs into the lungs every 4 (four) hours as needed for wheezing or shortness of breath (cough, shortness of breath or wheezing.). 04/13/16  Yes Kelvin Cellar, MD  clonazePAM (KLONOPIN) 1 MG tablet Take 1.5 mg by mouth daily.   Yes Historical Provider, MD  fluorometholone (FML) 0.1 % ophthalmic suspension Place 1 drop into both eyes 3 (three) times daily. 08/13/16  Yes Historical Provider, MD  losartan (COZAAR) 100 MG tablet Take 100 mg by mouth daily.   Yes Historical Provider, MD  mirtazapine (REMERON) 45 MG tablet Take 45 mg by mouth at bedtime. 08/19/16  Yes Historical Provider, MD  sulindac (CLINORIL) 150 MG tablet Take 150 mg by mouth daily as needed (arthiritis).    Yes Historical Provider, MD  methotrexate (RHEUMATREX) 2.5 MG tablet Take 7.5 mg by mouth every 7 (seven) days.  07/16/16   Historical Provider, MD  sulfaSALAzine (AZULFIDINE) 500  MG EC tablet TAKE 2 TABLETS BY MOUTH TWICE A DAY Patient not taking: Reported on 09/21/2016 08/30/16   Naitik Panwala, PA-C  sulfaSALAzine (AZULFIDINE) 500 MG tablet Take 500 mg by mouth daily as needed (arthritis).    Historical Provider, MD   Physical Exam: Vitals:   09/21/16 1500 09/21/16 1515 09/21/16 1530 09/21/16 1605  BP: 131/57 132/63 154/65   Pulse: 103 102 100   Resp:  20 20   Temp:      SpO2: 95% 96% 99%   Weight:    75.8 kg (167 lb 1.6 oz)  Height:    5' 7.5" (1.715 m)    Wt Readings from Last 3 Encounters:  09/21/16 75.8 kg (167 lb 1.6 oz)  06/11/16 83  kg (183 lb)  04/13/16 85.3 kg (188 lb)    General:  Appears calm and comfortable, pleasant, NAD, AAOx3, tremulous, pleasant Eyes: PERRL, normal lids, irises & conjunctiva ENT: grossly normal hearing, lips & tongue, mmm Neck: no LAD, masses or thyromegaly Cardiovascular: RRR, tachycardic, no m/r/g. No LE edema. Telemetry: ST, no arrhythmias  Respiratory: diminished diffusely, frequent coughing on exam, no exp on my exam.  Normal respiratory effort.  Not using accessory muscles. Abdomen: soft, ntnd Skin: no rash or induration seen on limited exam Musculoskeletal: grossly normal tone BUE/BLE Psychiatric: grossly normal mood and affect, speech fluent and appropriate, speaks in full sentences. Neurologic: grossly non-focal.          Labs on Admission:  Basic Metabolic Panel:  Recent Labs Lab 09/21/16 1039  NA 139  K 3.5  CL 106  CO2 24  GLUCOSE 158*  BUN 11  CREATININE 0.79  CALCIUM 9.3   Liver Function Tests: No results for input(s): AST, ALT, ALKPHOS, BILITOT, PROT, ALBUMIN in the last 168 hours. No results for input(s): LIPASE, AMYLASE in the last 168 hours. No results for input(s): AMMONIA in the last 168 hours. CBC:  Recent Labs Lab 09/21/16 1039  WBC 5.2  HGB 13.4  HCT 38.7  MCV 89.8  PLT 175   Cardiac Enzymes: No results for input(s): CKTOTAL, CKMB, CKMBINDEX, TROPONINI in the last 168 hours.  BNP (last 3 results)  Recent Labs  04/10/16 1900  BNP 30.4    ProBNP (last 3 results) No results for input(s): PROBNP in the last 8760 hours.  CBG: No results for input(s): GLUCAP in the last 168 hours.  Radiological Exams on Admission: Dg Chest 2 View  Result Date: 09/21/2016 CLINICAL DATA:  Cardiac palpitations EXAM: CHEST  2 VIEW COMPARISON:  08/26/16 FINDINGS: The heart size and mediastinal contours are within normal limits. Both lungs are clear. The visualized skeletal structures are unremarkable. IMPRESSION: No active cardiopulmonary disease.  Electronically Signed   By: Inez Catalina M.D.   On: 09/21/2016 11:12    EKG: Independently reviewed.   EKG Interpretation  Date/Time:  Saturday September 21 2016 10:20:03 EST Ventricular Rate:  96 PR Interval:  124 QRS Duration: 82 QT Interval:  372 QTC Calculation: 469 R Axis:   75 Text Interpretation:  Normal sinus rhythm Biatrial enlargement Nonspecific ST abnormality Abnormal ECG No significant change since last tracing Reconfirmed by JACUBOWITZ  MD, SAM [phone]) on 09/21/2016 10:45:48 AM        Assessment/Plan Principal Problem:   COPD exacerbation (HCC) Active Problems:   Anxiety   Essential hypertension   Smoker   Major depressive disorder, recurrent severe without psychotic features ([place])   1. Aecopd, in setting of tob - cxr neg, mild  tremulous after nebs trx. - obs tele - continue duonebs scheduled, albuterol prn, prednisone 40 qd, hold off on abx given afebrile, no prod cough and nml cxr. - RT cs, PT/ot eval and treat  2. tob abuse - total cessation recd and discussed - will continue nicoderm 34mcg patch., patient already wearing one from home.  3. htn - conintue cozaar 100 qd  4. Mdd/anxiety - no si/hi/avh - continue home klonopin, remeron qhs  No c/s at this time  Code Status: full DVT Prophylaxis: lovenox 40 Family Communication: only patient, no family at bedside Disposition Plan: obs tele  Time spent: 52mins  Maren Reamer MD., MBA/MHA [facility] Pager [phone]   5pm, called by RN. Pt  Stopped her klonopin, xanax and buspar about 1 month ago because it caused her more anxiety. Now anxious, but does not want these meds. Will dc klonopin.  Trial aterax prn. Consider psyche c/s in am.

## 2016-09-21 NOTE — ED Triage Notes (Signed)
Pt. Stated, I've been wheezing that started 3 days ago and this morning I was having a fast heart beat and sometimes skipping.

## 2016-09-21 NOTE — Progress Notes (Addendum)
Case Management referral for COPD Gold protocal and Clinical Pathway, spoke to patient about home health agencies, she stated her mother had an agency she thought it was [facility]" ask if it was okay if she was set up with [facility] to evaluate her COPD once home she agreed.  Patient will be admitted per nurse to 5W for overnight evaluation.  Notified [facility] rep of potential client upon discharge on tomorrow. Verified patient's address and contact cell phone number.  Home health orders for COPD Gold protocal and Clinical Pathway and a Face to Face required prior to discharge for completion of order.  Rayburn Ma RN, BSN, CM

## 2016-09-22 DIAGNOSIS — J441 Chronic obstructive pulmonary disease with (acute) exacerbation: Secondary | ICD-10-CM | POA: Diagnosis not present

## 2016-09-22 LAB — BASIC METABOLIC PANEL
Anion gap: 6 (ref 5–15)
BUN: 17 mg/dL (ref 6–20)
CO2: 27 mmol/L (ref 22–32)
CREATININE: 0.89 mg/dL (ref 0.44–1.00)
Calcium: 8.9 mg/dL (ref 8.9–10.3)
Chloride: 107 mmol/L (ref 101–111)
GFR calc non Af Amer: >60 mL/min (ref >60)
Glucose, Bld: 123 mg/dL — ABNORMAL HIGH (ref 65–99)
Potassium: 4.2 mmol/L (ref 3.5–5.1)
Sodium: 140 mmol/L (ref 135–145)

## 2016-09-22 LAB — CBC
HCT: 35.7 % — ABNORMAL LOW (ref 36.0–46.0)
HEMOGLOBIN: 12.3 g/dL (ref 12.0–15.0)
MCH: 31.6 pg (ref 26.0–34.0)
MCHC: 34.5 g/dL (ref 30.0–36.0)
MCV: 91.8 fL (ref 78.0–100.0)
PLATELETS: 170 10*3/uL (ref 150–400)
RBC: 3.89 MIL/uL (ref 3.87–5.11)
RDW: 15 % (ref 11.5–15.5)
WBC: 6.2 10*3/uL (ref 4.0–10.5)

## 2016-09-22 LAB — HEMOGLOBIN A1C
Hgb A1c MFr Bld: 4.2 % — ABNORMAL LOW (ref 4.8–5.6)
Mean Plasma Glucose: 74 mg/dL

## 2016-09-22 MED ORDER — AZITHROMYCIN 250 MG PO TABS: 250.0000 mg | ORAL_TABLET | Freq: Every day | ORAL | 0 refills | Status: DC

## 2016-09-22 MED ORDER — IPRATROPIUM BROMIDE 0.02 % IN SOLN: 0.5000 mg | Freq: Two times a day (BID) | RESPIRATORY_TRACT | Status: DC

## 2016-09-22 MED ORDER — IPRATROPIUM BROMIDE 0.02 % IN SOLN
0.5000 mg | Freq: Three times a day (TID) | RESPIRATORY_TRACT | Status: DC
  Administered 2016-09-22: 0.5 mg via RESPIRATORY_TRACT
  Filled 2016-09-22: qty 2.5

## 2016-09-22 MED ORDER — LEVALBUTEROL HCL 0.63 MG/3ML IN NEBU: 0.6300 mg | INHALATION_SOLUTION | Freq: Four times a day (QID) | RESPIRATORY_TRACT | Status: DC | PRN

## 2016-09-22 MED ORDER — LEVALBUTEROL HCL 0.63 MG/3ML IN NEBU: 0.6300 mg | INHALATION_SOLUTION | Freq: Two times a day (BID) | RESPIRATORY_TRACT | Status: DC

## 2016-09-22 MED ORDER — ALPRAZOLAM 0.25 MG PO TABS
0.2500 mg | ORAL_TABLET | Freq: Once | ORAL | Status: AC
  Administered 2016-09-22: 0.25 mg via ORAL
  Filled 2016-09-22: qty 1

## 2016-09-22 MED ORDER — TRAZODONE HCL 50 MG PO TABS
50.0000 mg | ORAL_TABLET | Freq: Once | ORAL | Status: DC
  Filled 2016-09-22: qty 1

## 2016-09-22 MED ORDER — PREDNISONE 20 MG PO TABS: 20.0000 mg | ORAL_TABLET | Freq: Every day | ORAL | 0 refills | Status: DC

## 2016-09-22 MED ORDER — LEVALBUTEROL HCL 0.63 MG/3ML IN NEBU
0.6300 mg | INHALATION_SOLUTION | Freq: Three times a day (TID) | RESPIRATORY_TRACT | Status: DC
  Administered 2016-09-22: 0.63 mg via RESPIRATORY_TRACT
  Filled 2016-09-22: qty 3

## 2016-09-22 NOTE — Progress Notes (Signed)
Kirsten Harper to be D/C'd to home per MD order.  Discussed with the patient and all questions fully answered.  VSS, Skin clean, dry and intact without evidence of skin break down, no evidence of skin tears noted. IV catheter discontinued intact. Site without signs and symptoms of complications. Dressing and pressure applied.  An After Visit Summary was printed and given to the patient. Patient received prescription.  D/c education completed with patient including follow up instructions, medication list, d/c activities limitations if indicated, with other d/c instructions as indicated by MD - patient able to verbalize understanding, all questions fully answered.   Patient instructed to return to ED, call 911, or call MD for any changes in condition.   Patient escorted via [place], and D/C home via private auto.  [name] 09/22/2016 1:14 PM

## 2016-09-22 NOTE — Progress Notes (Signed)
PT Cancellation Note  Patient Details Name: Kirsten Harper MRN: [id number] DOB: [birth year]   Cancelled Treatment:    Reason Eval/Treat Not Completed: PT screened, no needs identified, will sign off. Pt is independent in the room and able to mobilize without assistance. No PT need required at this time.    Scheryl Marten PT, DPT  [phone]  09/22/2016, 9:48 AM

## 2016-09-22 NOTE — Discharge Summary (Signed)
Physician Discharge Summary  Kirsten Harper MHD:[id number] DOB: 05/10/1946 DOA: 09/21/2016  PCP: Osborne Casco, MD  Admit date: 09/21/2016 Discharge date: 09/22/2016  Time spent: 35 minutes  Recommendations for Outpatient Follow-up:  1. PCP in 1 week 2. Dr.Devshwar in 2-3weeks   Discharge Diagnoses:  Principal Problem:   COPD exacerbation ([place]) Active Problems:   Anxiety   Essential hypertension   Smoker   Major depressive disorder, recurrent severe without psychotic features ([place])   Ankylosing spondylitis  Discharge Condition: stable  Diet recommendation: heart healthy  Filed Weights   09/21/16 1016 09/21/16 1605  Weight: 75.8 kg (167 lb) 75.8 kg (167 lb 1.6 oz)    History of present illness:  Kirsten Harper is a 71 y.o. female with history of COPD, chronic kidney disease stage II to hypertension, anxiety, depression and history of ankylosing spondylolysis, presents to our ER secondary to wheezing for the last 3 days and complaints of "heart pounding".  Of note, she last smoked cigarettes (quantified as "just a few") 4 days ago  Hospital Course:  1. COPD exacerbation -improved with steroids, nebs -CXR clear, vitals stable and lungs clear at the time of discharge -discharged home on Prednisone taper, zithromycin  2. tob abuse - total cessation recd and discussed  3. htn - conintue cozaar 100 qd  4. Anxiety/Depression -reportedly stopped xanax/clonipin 1 month ago -continue Remeron, address with PCP  5. H/o Ankylosing spondylitis -followed by Dr.Deveshwar, on MTX   Discharge Exam: Vitals:   09/21/16 2302 09/22/16 0613  BP: (!) 125/57 131/67  Pulse: 93 82  Resp: (!) 24 18  Temp: 99.4 F (37.4 C) 98.9 F (37.2 C)    General: AAOx3 Cardiovascular: S1S2/RRR Respiratory: improved air movement, no wheezes  Discharge Instructions   Discharge Instructions    Diet - low sodium heart healthy    Complete by:  As directed    Increase activity  slowly    Complete by:  As directed      Current Discharge Medication List    START taking these medications   Details  azithromycin (ZITHROMAX) 250 MG tablet Take 1 tablet (250 mg total) by mouth daily. For 3days Qty: 3 each, Refills: 0    predniSONE (DELTASONE) 20 MG tablet Take 1-2 tablets (20-40 mg total) by mouth daily with breakfast. Take 40mg  for 2days then 20mg  for 2days then STOP Qty: 6 tablet, Refills: 0      CONTINUE these medications which have NOT CHANGED   Details  albuterol (PROVENTIL HFA;VENTOLIN HFA) 108 (90 Base) MCG/ACT inhaler Inhale 2 puffs into the lungs every 4 (four) hours as needed for wheezing or shortness of breath (cough, shortness of breath or wheezing.). Qty: 1 Inhaler, Refills: 1   Associated Diagnoses: Acute bronchitis, viral; Wheezing; Cough    fluorometholone (FML) 0.1 % ophthalmic suspension Place 1 drop into both eyes 3 (three) times daily. Refills: 5    losartan (COZAAR) 100 MG tablet Take 100 mg by mouth daily.    mirtazapine (REMERON) 45 MG tablet Take 45 mg by mouth at bedtime.    sulindac (CLINORIL) 150 MG tablet Take 150 mg by mouth daily as needed (arthiritis).     methotrexate (RHEUMATREX) 2.5 MG tablet Take 7.5 mg by mouth every 7 (seven) days.  Refills: 3    sulfaSALAzine (AZULFIDINE) 500 MG tablet Take 500 mg by mouth daily as needed (arthritis).      STOP taking these medications     clonazePAM (KLONOPIN) 1 MG tablet  sulfaSALAzine (AZULFIDINE) 500 MG EC tablet        Allergies  Allergen Reactions  . Amoxapine And Related     Patient reports it did not work for her.    Follow-up Information    Osborne Casco, MD. Schedule an appointment as soon as possible for a visit in 1 week(s).   Specialty:  Family Medicine Contact information: [address]  [place] [address] [phone]            The results of significant diagnostics from this hospitalization (including imaging,  microbiology, ancillary and laboratory) are listed below for reference.    Significant Diagnostic Studies: Dg Chest 2 View  Result Date: 09/21/2016 CLINICAL DATA:  Cardiac palpitations EXAM: CHEST  2 VIEW COMPARISON:  08/26/16 FINDINGS: The heart size and mediastinal contours are within normal limits. Both lungs are clear. The visualized skeletal structures are unremarkable. IMPRESSION: No active cardiopulmonary disease. Electronically Signed   By: Inez Catalina M.D.   On: 09/21/2016 11:12   Dg Chest 2 View  Result Date: 08/26/2016 CLINICAL DATA:  Patient feels achy all over. Altered mental status. History of hypertension and COPD. EXAM: CHEST  2 VIEW COMPARISON:  04/10/2016 FINDINGS: The heart size and mediastinal contours are within normal limits. Both lungs are clear. The visualized skeletal structures are unremarkable. IMPRESSION: No active cardiopulmonary disease. Electronically Signed   By: Lucienne Capers M.D.   On: 08/26/2016 23:24    Microbiology: No results found for this or any previous visit (from the past 240 hour(s)).   Labs: Basic Metabolic Panel:  Recent Labs Lab 09/21/16 1039 09/22/16 0714  NA 139 140  K 3.5 4.2  CL 106 107  CO2 24 27  GLUCOSE 158* 123*  BUN 11 17  CREATININE 0.79 0.89  CALCIUM 9.3 8.9   Liver Function Tests: No results for input(s): AST, ALT, ALKPHOS, BILITOT, PROT, ALBUMIN in the last 168 hours. No results for input(s): LIPASE, AMYLASE in the last 168 hours. No results for input(s): AMMONIA in the last 168 hours. CBC:  Recent Labs Lab 09/21/16 1039 09/22/16 0714  WBC 5.2 6.2  HGB 13.4 12.3  HCT 38.7 35.7*  MCV 89.8 91.8  PLT 175 170   Cardiac Enzymes: No results for input(s): CKTOTAL, CKMB, CKMBINDEX, TROPONINI in the last 168 hours. BNP: BNP (last 3 results)  Recent Labs  04/10/16 1900  BNP 30.4    ProBNP (last 3 results) No results for input(s): PROBNP in the last 8760 hours.  CBG: No results for input(s): GLUCAP in  the last 168 hours.     SignedDomenic Polite MD.  [facility] 09/22/2016, 11:16 AM

## 2016-09-23 ENCOUNTER — Emergency Department (HOSPITAL_COMMUNITY): Payer: Medicare Other

## 2016-09-23 ENCOUNTER — Encounter (HOSPITAL_COMMUNITY): Payer: Self-pay | Admitting: Emergency Medicine

## 2016-09-23 ENCOUNTER — Inpatient Hospital Stay (HOSPITAL_COMMUNITY)
Admission: EM | Admit: 2016-09-23 | Discharge: 2016-09-26 | DRG: 194 | Disposition: A | Discharge status: Home / Self Care | Payer: Medicare Other | Attending: Internal Medicine | Admitting: Internal Medicine

## 2016-09-23 DIAGNOSIS — F17200 Nicotine dependence, unspecified, uncomplicated: Secondary | ICD-10-CM | POA: Diagnosis present

## 2016-09-23 DIAGNOSIS — M459 Ankylosing spondylitis of unspecified sites in spine: Secondary | ICD-10-CM | POA: Diagnosis present

## 2016-09-23 DIAGNOSIS — I10 Essential (primary) hypertension: Secondary | ICD-10-CM | POA: Diagnosis present

## 2016-09-23 DIAGNOSIS — R05 Cough: Secondary | ICD-10-CM | POA: Diagnosis not present

## 2016-09-23 DIAGNOSIS — Z888 Allergy status to other drugs, medicaments and biological substances status: Secondary | ICD-10-CM

## 2016-09-23 DIAGNOSIS — G8929 Other chronic pain: Secondary | ICD-10-CM | POA: Diagnosis present

## 2016-09-23 DIAGNOSIS — N182 Chronic kidney disease, stage 2 (mild): Secondary | ICD-10-CM | POA: Diagnosis present

## 2016-09-23 DIAGNOSIS — J189 Pneumonia, unspecified organism: Secondary | ICD-10-CM | POA: Diagnosis not present

## 2016-09-23 DIAGNOSIS — M457 Ankylosing spondylitis of lumbosacral region: Secondary | ICD-10-CM | POA: Diagnosis present

## 2016-09-23 DIAGNOSIS — R0682 Tachypnea, not elsewhere classified: Secondary | ICD-10-CM | POA: Diagnosis present

## 2016-09-23 DIAGNOSIS — Z961 Presence of intraocular lens: Secondary | ICD-10-CM | POA: Diagnosis present

## 2016-09-23 DIAGNOSIS — J181 Lobar pneumonia, unspecified organism: Secondary | ICD-10-CM

## 2016-09-23 DIAGNOSIS — M545 Low back pain: Secondary | ICD-10-CM | POA: Diagnosis present

## 2016-09-23 DIAGNOSIS — Z79899 Other long term (current) drug therapy: Secondary | ICD-10-CM

## 2016-09-23 DIAGNOSIS — J440 Chronic obstructive pulmonary disease with acute lower respiratory infection: Secondary | ICD-10-CM | POA: Diagnosis present

## 2016-09-23 DIAGNOSIS — F17210 Nicotine dependence, cigarettes, uncomplicated: Secondary | ICD-10-CM | POA: Diagnosis not present

## 2016-09-23 DIAGNOSIS — R000 Tachycardia, unspecified: Secondary | ICD-10-CM | POA: Diagnosis present

## 2016-09-23 DIAGNOSIS — Z9841 Cataract extraction status, right eye: Secondary | ICD-10-CM

## 2016-09-23 DIAGNOSIS — R509 Fever, unspecified: Secondary | ICD-10-CM | POA: Diagnosis not present

## 2016-09-23 DIAGNOSIS — J441 Chronic obstructive pulmonary disease with (acute) exacerbation: Secondary | ICD-10-CM | POA: Diagnosis present

## 2016-09-23 DIAGNOSIS — I129 Hypertensive chronic kidney disease with stage 1 through stage 4 chronic kidney disease, or unspecified chronic kidney disease: Secondary | ICD-10-CM | POA: Diagnosis present

## 2016-09-23 DIAGNOSIS — F332 Major depressive disorder, recurrent severe without psychotic features: Secondary | ICD-10-CM

## 2016-09-23 DIAGNOSIS — Z9842 Cataract extraction status, left eye: Secondary | ICD-10-CM

## 2016-09-23 DIAGNOSIS — Z8249 Family history of ischemic heart disease and other diseases of the circulatory system: Secondary | ICD-10-CM

## 2016-09-23 LAB — CBC WITH DIFFERENTIAL/PLATELET
BASOS PCT: 0 %
Basophils Absolute: 0 10*3/uL (ref 0.0–0.1)
EOS ABS: 0 10*3/uL (ref 0.0–0.7)
Eosinophils Relative: 0 %
HCT: 41.1 % (ref 36.0–46.0)
HEMOGLOBIN: 14 g/dL (ref 12.0–15.0)
Lymphocytes Relative: 14 %
Lymphs Abs: 0.9 10*3/uL (ref 0.7–4.0)
MCH: 30.6 pg (ref 26.0–34.0)
MCHC: 34.1 g/dL (ref 30.0–36.0)
MCV: 89.9 fL (ref 78.0–100.0)
MONO ABS: 1 10*3/uL (ref 0.1–1.0)
MONOS PCT: 15 %
NEUTROS PCT: 71 %
Neutro Abs: 4.8 10*3/uL (ref 1.7–7.7)
Platelets: 175 10*3/uL (ref 150–400)
RBC: 4.57 MIL/uL (ref 3.87–5.11)
RDW: 14.7 % (ref 11.5–15.5)
WBC: 6.7 10*3/uL (ref 4.0–10.5)

## 2016-09-23 LAB — I-STAT CG4 LACTIC ACID, ED: Lactic Acid, Venous: 1.03 mmol/L (ref 0.5–1.9)

## 2016-09-23 MED ORDER — IPRATROPIUM-ALBUTEROL 0.5-2.5 (3) MG/3ML IN SOLN
3.0000 mL | Freq: Once | RESPIRATORY_TRACT | Status: AC
  Administered 2016-09-24: 3 mL via RESPIRATORY_TRACT
  Filled 2016-09-23: qty 3

## 2016-09-23 MED ORDER — GUAIFENESIN 100 MG/5ML PO SOLN
10.0000 mL | Freq: Once | ORAL | Status: AC
  Administered 2016-09-24: 200 mg via ORAL
  Filled 2016-09-23: qty 10

## 2016-09-23 MED ORDER — ACETAMINOPHEN 325 MG PO TABS
650.0000 mg | ORAL_TABLET | Freq: Once | ORAL | Status: AC | PRN
  Administered 2016-09-23: 650 mg via ORAL
  Filled 2016-09-23: qty 2

## 2016-09-23 MED ORDER — SODIUM CHLORIDE 0.9 % IV BOLUS (SEPSIS)
1000.0000 mL | Freq: Once | INTRAVENOUS | Status: AC
  Administered 2016-09-24: 1000 mL via INTRAVENOUS

## 2016-09-23 MED ORDER — LEVOFLOXACIN IN D5W 750 MG/150ML IV SOLN
750.0000 mg | Freq: Once | INTRAVENOUS | Status: AC
  Administered 2016-09-24: 750 mg via INTRAVENOUS
  Filled 2016-09-23: qty 150

## 2016-09-23 NOTE — ED Notes (Signed)
Patient transported to X-ray 

## 2016-09-23 NOTE — ED Triage Notes (Signed)
Pt presents to ER with fever that developed today and patient states she cannot get it down despite OTC antipyretics (last dose 530-6p); pt also c/o nausea and diarrhea worse with coughing

## 2016-09-23 NOTE — ED Provider Notes (Signed)
TIME SEEN: 11:53 PM  By signing my name below, I, Ethelle Lyon Long, attest that this documentation has been prepared under the direction and in the presence of [place], DO . Electronically Signed: Ethelle Lyon Long, Scribe. 09/24/2016. 11:59 PM.  CHIEF COMPLAINT: Fever, Nausea  HPI: HPI Comments:  Kirsten Harper is a 71 y.o. female history of COPD not on home oxygen, hypertension who presents to the Emergency Department by way of ambulance complaining of persistent fever (TMax 103) onset this afternoon. She states she was at the ED on 09/21/16 for a COPD exacerbation and was admitted to the hospital. She is seen today for Tx of her fever that would not break with antipyretic use (last dose 5:30PM). Pt has associated symptoms of mild nausea and mild diarrhea and a mildly productive cough with phlegm that exacerbates the diarrhea. She states she feels very weak at the current time and feels unable to drive home. She is requesting admission to the hospital. No current wheezing. Pt denies generalized myalgias, vomiting, dysuria, hematuria, vaginal discharge, abdominal pain, CP, SOB, and any other complaints at this time.   ROS: See HPI Constitutional:  fever  Eyes: no drainage  ENT: no runny nose   Cardiovascular:  no chest pain  Resp: no SOB. Positive mild productive cough GI: no vomiting or abdominal pain GU: no dysuria, hematuria, or vaginal discharge Integumentary: no rash  Allergy: no hives  Musculoskeletal: no leg swelling, or generalized myalgias Neurological: no slurred speech ROS otherwise negative  PAST MEDICAL HISTORY/PAST SURGICAL HISTORY:  Past Medical History:  Diagnosis Date  . Ankylosing spondylitis ([place])   . Anxiety   . Arthritis   . Chronic lower back pain   . CKD (chronic kidney disease), stage II    "related to the Ankylosing spondylitis" (04/10/2016)  . COPD (chronic obstructive pulmonary disease) ([place])   . Eye disease   . Hypertension   . PONV (postoperative  nausea and vomiting)    "didn't bother me the last time I had it in ~ 2015"    MEDICATIONS:  Prior to Admission medications   Medication Sig Start Date End Date Taking? Authorizing Provider  albuterol (PROVENTIL HFA;VENTOLIN HFA) 108 (90 Base) MCG/ACT inhaler Inhale 2 puffs into the lungs every 4 (four) hours as needed for wheezing or shortness of breath (cough, shortness of breath or wheezing.). 04/13/16   Kelvin Cellar, MD  azithromycin (ZITHROMAX) 250 MG tablet Take 1 tablet (250 mg total) by mouth daily. For 3days 09/22/16   Domenic Polite, MD  fluorometholone (FML) 0.1 % ophthalmic suspension Place 1 drop into both eyes 3 (three) times daily. 08/13/16   Historical Provider, MD  losartan (COZAAR) 100 MG tablet Take 100 mg by mouth daily.    Historical Provider, MD  methotrexate (RHEUMATREX) 2.5 MG tablet Take 7.5 mg by mouth every 7 (seven) days.  07/16/16   Historical Provider, MD  mirtazapine (REMERON) 45 MG tablet Take 45 mg by mouth at bedtime. 08/19/16   Historical Provider, MD  predniSONE (DELTASONE) 20 MG tablet Take 1-2 tablets (20-40 mg total) by mouth daily with breakfast. Take 40mg  for 2days then 20mg  for 2days then STOP 09/22/16   Domenic Polite, MD  sulfaSALAzine (AZULFIDINE) 500 MG tablet Take 500 mg by mouth daily as needed (arthritis).    Historical Provider, MD  sulindac (CLINORIL) 150 MG tablet Take 150 mg by mouth daily as needed (arthiritis).     Historical Provider, MD    ALLERGIES:  Allergies  Allergen Reactions  . Amoxapine And Related     Patient reports it did not work for her.     SOCIAL HISTORY:  Social History  Substance Use Topics  . Smoking status: Current Every Day Smoker    Packs/day: 0.50    Years: 51.00    Types: Cigarettes  . Smokeless tobacco: Never Used  . Alcohol use 0.6 oz/week    1 Glasses of wine per week    FAMILY HISTORY: Family History  Problem Relation Age of Onset  . Hypertension Mother   . Heart disease Sister   . Diabetes Sister    . Pulmonary fibrosis Brother   . Alzheimer's disease Brother     EXAM: BP 167/91 (BP Location: Right Arm)   Pulse 107   Temp 103 F (39.4 C) (Rectal)   Resp (!) 28   Ht 5\' 7"  (1.702 m)   Wt 167 lb (75.8 kg)   SpO2 98%   BMI 26.16 kg/m  CONSTITUTIONAL: Alert and oriented and responds appropriately to questions. Nontoxic appearing, well-nourished. Elderly, feels uncomfortable.  HEAD: Normocephalic EYES: Conjunctivae clear, pupils appear equal, EOMI ENT: normal nose; moist mucous membranes NECK: Supple, no meningismus, no nuchal rigidity, no LAD  CARD: RRR; S1 and S2 appreciated; no murmurs, no clicks, no rubs, no gallops RESP: Normal chest excursion without splinting or tachypnea; breath sounds clear and equal bilaterally; no wheezes, no rhonchi, no rales, no hypoxia or respiratory distress, speaking full sentences ABD/GI: Normal bowel sounds; non-distended; soft, non-tender, no rebound, no guarding, no peritoneal signs, no hepatosplenomegaly BACK:  The back appears normal and is non-tender to palpation, there is no CVA tenderness EXT: Normal ROM in all joints; non-tender to palpation; no edema; normal capillary refill; no cyanosis, no calf tenderness or swelling    SKIN: Normal color for age and race; warm; no rash NEURO: Moves all extremities equally PSYCH: The patient's mood and manner are appropriate. Grooming and personal hygiene are appropriate.  MEDICAL DECISION MAKING: Patient here with right-sided pneumonia seen on chest x-ray. She is hemodynamically stable but appears uncomfortable. We'll add on a flu swab. Labs are pending. No sinus COPD exacerbation currently. We'll give IV Levaquin for HCAP given she was just recently admitted to the hospital.  ED PROGRESS: Patient's labs are unremarkable. No leukocytosis. Lactate is normal. Initial CMP showed significantly elevated sodium, low calcium, bicarbonate and chloride. Suspect that this is erroneous, possibly drawn off a line  were saline has been given. It was repeated in her BMP is essentially normal. Patient is to hemodynamically stable. She is requesting admission and she does not feel comfortable going home. Her PCP is Kelton P[facility]. We'll discuss with hospitalist.   1:58 AM Discussed patient's case with hospitalist, Dr. Loleta Books.  I have recommended admission and patient (and family if present) agree with this plan. Admitting physician will place admission orders.   I reviewed all nursing notes, vitals, pertinent previous records, EKGs, lab and urine results, imaging (as available).    I personally performed the services described in this documentation, which was scribed in my presence. The recorded information has been reviewed and is accurate.      [place], DO 09/24/16 [phone]

## 2016-09-24 ENCOUNTER — Encounter (HOSPITAL_COMMUNITY): Payer: Self-pay | Admitting: Family Medicine

## 2016-09-24 DIAGNOSIS — R000 Tachycardia, unspecified: Secondary | ICD-10-CM | POA: Diagnosis present

## 2016-09-24 DIAGNOSIS — J189 Pneumonia, unspecified organism: Secondary | ICD-10-CM | POA: Diagnosis present

## 2016-09-24 DIAGNOSIS — M457 Ankylosing spondylitis of lumbosacral region: Secondary | ICD-10-CM | POA: Diagnosis not present

## 2016-09-24 DIAGNOSIS — I129 Hypertensive chronic kidney disease with stage 1 through stage 4 chronic kidney disease, or unspecified chronic kidney disease: Secondary | ICD-10-CM | POA: Diagnosis present

## 2016-09-24 DIAGNOSIS — M545 Low back pain: Secondary | ICD-10-CM | POA: Diagnosis present

## 2016-09-24 DIAGNOSIS — F17200 Nicotine dependence, unspecified, uncomplicated: Secondary | ICD-10-CM | POA: Diagnosis not present

## 2016-09-24 DIAGNOSIS — Z961 Presence of intraocular lens: Secondary | ICD-10-CM | POA: Diagnosis present

## 2016-09-24 DIAGNOSIS — I10 Essential (primary) hypertension: Secondary | ICD-10-CM

## 2016-09-24 DIAGNOSIS — Z9842 Cataract extraction status, left eye: Secondary | ICD-10-CM | POA: Diagnosis not present

## 2016-09-24 DIAGNOSIS — R0682 Tachypnea, not elsewhere classified: Secondary | ICD-10-CM | POA: Diagnosis present

## 2016-09-24 DIAGNOSIS — N182 Chronic kidney disease, stage 2 (mild): Secondary | ICD-10-CM | POA: Diagnosis present

## 2016-09-24 DIAGNOSIS — M459 Ankylosing spondylitis of unspecified sites in spine: Secondary | ICD-10-CM | POA: Diagnosis present

## 2016-09-24 DIAGNOSIS — J181 Lobar pneumonia, unspecified organism: Secondary | ICD-10-CM | POA: Diagnosis not present

## 2016-09-24 DIAGNOSIS — F332 Major depressive disorder, recurrent severe without psychotic features: Secondary | ICD-10-CM | POA: Diagnosis not present

## 2016-09-24 DIAGNOSIS — J441 Chronic obstructive pulmonary disease with (acute) exacerbation: Secondary | ICD-10-CM

## 2016-09-24 DIAGNOSIS — G8929 Other chronic pain: Secondary | ICD-10-CM | POA: Diagnosis present

## 2016-09-24 DIAGNOSIS — J440 Chronic obstructive pulmonary disease with acute lower respiratory infection: Secondary | ICD-10-CM | POA: Diagnosis present

## 2016-09-24 DIAGNOSIS — F17210 Nicotine dependence, cigarettes, uncomplicated: Secondary | ICD-10-CM | POA: Diagnosis present

## 2016-09-24 DIAGNOSIS — Z79899 Other long term (current) drug therapy: Secondary | ICD-10-CM | POA: Diagnosis not present

## 2016-09-24 DIAGNOSIS — Z9841 Cataract extraction status, right eye: Secondary | ICD-10-CM | POA: Diagnosis not present

## 2016-09-24 DIAGNOSIS — Z888 Allergy status to other drugs, medicaments and biological substances status: Secondary | ICD-10-CM | POA: Diagnosis not present

## 2016-09-24 DIAGNOSIS — Z8249 Family history of ischemic heart disease and other diseases of the circulatory system: Secondary | ICD-10-CM | POA: Diagnosis not present

## 2016-09-24 LAB — URINALYSIS, ROUTINE W REFLEX MICROSCOPIC
Bilirubin Urine: NEGATIVE
Glucose, UA: 50 mg/dL — AB
Hgb urine dipstick: NEGATIVE
KETONES UR: NEGATIVE mg/dL
LEUKOCYTES UA: NEGATIVE
NITRITE: NEGATIVE
Protein, ur: NEGATIVE mg/dL
Specific Gravity, Urine: 1.027 (ref 1.005–1.030)
pH: 5 (ref 5.0–8.0)

## 2016-09-24 LAB — BASIC METABOLIC PANEL
Anion gap: 9 (ref 5–15)
Anion gap: 8 (ref 5–15)
BUN: 15 mg/dL (ref 6–20)
BUN: 15 mg/dL (ref 6–20)
CHLORIDE: 102 mmol/L (ref 101–111)
CHLORIDE: 105 mmol/L (ref 101–111)
CO2: 25 mmol/L (ref 22–32)
CO2: 23 mmol/L (ref 22–32)
CREATININE: 0.88 mg/dL (ref 0.44–1.00)
Calcium: 8.7 mg/dL — ABNORMAL LOW (ref 8.9–10.3)
Calcium: 8 mg/dL — ABNORMAL LOW (ref 8.9–10.3)
Creatinine, Ser: 0.85 mg/dL (ref 0.44–1.00)
GFR calc Af Amer: >60 mL/min (ref >60)
GFR calc Af Amer: >60 mL/min (ref >60)
GFR calc non Af Amer: >60 mL/min (ref >60)
GLUCOSE: 153 mg/dL — AB (ref 65–99)
Glucose, Bld: 103 mg/dL — ABNORMAL HIGH (ref 65–99)
Potassium: 3.7 mmol/L (ref 3.5–5.1)
Potassium: 3.5 mmol/L (ref 3.5–5.1)
SODIUM: 136 mmol/L (ref 135–145)
Sodium: 136 mmol/L (ref 135–145)

## 2016-09-24 LAB — COMPREHENSIVE METABOLIC PANEL
ALK PHOS: 49 U/L (ref 38–126)
ALT: 18 U/L (ref 14–54)
ANION GAP: 52 — AB (ref 5–15)
AST: 20 U/L (ref 15–41)
Albumin: 3.5 g/dL (ref 3.5–5.0)
BILIRUBIN TOTAL: 1 mg/dL (ref 0.3–1.2)
BUN: 14 mg/dL (ref 6–20)
CALCIUM: 5.4 mg/dL — AB (ref 8.9–10.3)
CO2: 21 mmol/L — ABNORMAL LOW (ref 22–32)
Chloride: 84 mmol/L — ABNORMAL LOW (ref 101–111)
Creatinine, Ser: 0.83 mg/dL (ref 0.44–1.00)
GFR calc non Af Amer: >60 mL/min (ref >60)
Glucose, Bld: 95 mg/dL (ref 65–99)
Potassium: 3.6 mmol/L (ref 3.5–5.1)
Sodium: 157 mmol/L — ABNORMAL HIGH (ref 135–145)
TOTAL PROTEIN: 5.8 g/dL — AB (ref 6.5–8.1)

## 2016-09-24 LAB — INFLUENZA PANEL BY PCR (TYPE A & B)
INFLAPCR: NEGATIVE
INFLBPCR: NEGATIVE

## 2016-09-24 LAB — CBC
HCT: 32.8 % — ABNORMAL LOW (ref 36.0–46.0)
Hemoglobin: 11.3 g/dL — ABNORMAL LOW (ref 12.0–15.0)
MCH: 30.7 pg (ref 26.0–34.0)
MCHC: 34.5 g/dL (ref 30.0–36.0)
MCV: 89.1 fL (ref 78.0–100.0)
PLATELETS: 156 10*3/uL (ref 150–400)
RBC: 3.68 MIL/uL — ABNORMAL LOW (ref 3.87–5.11)
RDW: 14.7 % (ref 11.5–15.5)
WBC: 5.2 10*3/uL (ref 4.0–10.5)

## 2016-09-24 LAB — PROCALCITONIN: Procalcitonin: <0.1 ng/mL

## 2016-09-24 LAB — STREP PNEUMONIAE URINARY ANTIGEN: Strep Pneumo Urinary Antigen: NEGATIVE

## 2016-09-24 MED ORDER — ACETAMINOPHEN 650 MG RE SUPP: 650.0000 mg | Freq: Four times a day (QID) | RECTAL | Status: DC | PRN

## 2016-09-24 MED ORDER — ACETAMINOPHEN 325 MG PO TABS
650.0000 mg | ORAL_TABLET | Freq: Four times a day (QID) | ORAL | Status: DC | PRN
Start: 2016-09-24 — End: 2016-09-26
  Administered 2016-09-24 – 2016-09-25 (×4): 650 mg via ORAL
  Filled 2016-09-24 (×4): qty 2

## 2016-09-24 MED ORDER — MIRTAZAPINE 15 MG PO TABS
45.0000 mg | ORAL_TABLET | Freq: Every day | ORAL | Status: DC
  Administered 2016-09-24 – 2016-09-25 (×2): 45 mg via ORAL
  Filled 2016-09-24 (×2): qty 3

## 2016-09-24 MED ORDER — ONDANSETRON HCL 4 MG/2ML IJ SOLN
4.0000 mg | Freq: Four times a day (QID) | INTRAMUSCULAR | Status: DC | PRN
  Administered 2016-09-24 (×2): 4 mg via INTRAVENOUS
  Filled 2016-09-24 (×2): qty 2

## 2016-09-24 MED ORDER — PREDNISONE 20 MG PO TABS
20.0000 mg | ORAL_TABLET | Freq: Every day | ORAL | Status: AC
  Administered 2016-09-24 – 2016-09-25 (×2): 20 mg via ORAL
  Filled 2016-09-24 (×2): qty 1

## 2016-09-24 MED ORDER — LORAZEPAM 1 MG PO TABS
1.0000 mg | ORAL_TABLET | Freq: Once | ORAL | Status: AC
  Administered 2016-09-24: 1 mg via ORAL
  Filled 2016-09-24: qty 1

## 2016-09-24 MED ORDER — LEVOFLOXACIN IN D5W 750 MG/150ML IV SOLN
750.0000 mg | INTRAVENOUS | Status: DC
  Administered 2016-09-25 – 2016-09-26 (×2): 750 mg via INTRAVENOUS
  Filled 2016-09-24 (×2): qty 150

## 2016-09-24 MED ORDER — SODIUM CHLORIDE 0.9 % IV SOLN
INTRAVENOUS | Status: DC
  Administered 2016-09-25: 01:00:00 via INTRAVENOUS

## 2016-09-24 MED ORDER — ONDANSETRON HCL 4 MG PO TABS: 4.0000 mg | ORAL_TABLET | Freq: Four times a day (QID) | ORAL | Status: DC | PRN

## 2016-09-24 MED ORDER — ENOXAPARIN SODIUM 40 MG/0.4ML ~~LOC~~ SOLN
40.0000 mg | SUBCUTANEOUS | Status: DC
  Administered 2016-09-24 – 2016-09-25 (×2): 40 mg via SUBCUTANEOUS
  Filled 2016-09-24 (×2): qty 0.4

## 2016-09-24 MED ORDER — GUAIFENESIN ER 600 MG PO TB12
600.0000 mg | ORAL_TABLET | Freq: Two times a day (BID) | ORAL | Status: DC
  Administered 2016-09-24 – 2016-09-25 (×3): 600 mg via ORAL
  Filled 2016-09-24 (×3): qty 1

## 2016-09-24 MED ORDER — FLUOROMETHOLONE 0.1 % OP SUSP
1.0000 [drp] | Freq: Three times a day (TID) | OPHTHALMIC | Status: DC
  Administered 2016-09-24 – 2016-09-26 (×7): 1 [drp] via OPHTHALMIC
  Filled 2016-09-24: qty 5

## 2016-09-24 NOTE — Progress Notes (Signed)
Patient admitted earlier today by Dr. [name]. See full H&P for details.  71 year old female with a history of COPD not on home oxygen, hypertension, ankylosing spondylitis, who presented to the emergency department with complaints of fever and nonproductive cough. Patient was recently seen and discharged 2 days ago for COPD exacerbation. She was discharged with a prednisone taper and Z-Pak. Patient was tested for influenza however that was negative. Patient was febrile along with tachycardia upon admission, and was found to have possible pneumonia on chest x-ray. Currently placed on Levaquin. Blood and sputum cultures pending. Continue prednisone for additional 2 days to complete taper.   Time spent: 25 minutes  Damani Rando D.O. [facility] Pager [phone]  If 7PM-7AM, please contact night-coverage [email or web address] Password [facility] 09/24/2016, 12:21 PM

## 2016-09-24 NOTE — Care Management Note (Signed)
Case Management Note  Patient Details  Name: Kirsten Harper MRN: [id number] Date of Birth: Nov 09, 1945  Subjective/Objective:            Patient presented with pneumonia. Lives at home alone. CM will follow for discharge needs pending patient's progress and physician orders.         Action/Plan:   Expected Discharge Date:  09/24/16               Expected Discharge Plan:     In-House Referral:     Discharge planning Services     Post Acute Care Choice:    Choice offered to:     DME Arranged:    DME Agency:     HH Arranged:    HH Agency:     Status of Service:     If discussed at [employer], dates discussed:    Additional Comments:  Rolm Baptise, RN 09/24/2016, 10:20 AM

## 2016-09-24 NOTE — Progress Notes (Signed)
Patient very anxious and stating " I am afraid I may get panic attack, can I have something to calm my nerves, but I don/t want Clonopin. " Medical personnel on call text paged and order for ativan 1 mg Po x 1 given.   Pt seems to calm down now. Call bell within reach to call for assist as needed,RN will continue to round on pt hourly and as needed.

## 2016-09-24 NOTE — H&P (Signed)
History and Physical  Patient Name: Kirsten Harper     EUM:[id number]    DOB: May 25, 1946    DOA: 09/23/2016 PCP: Osborne Casco, MD   Patient coming from: Home  Chief Complaint: Fever  HPI: Kirsten Harper is a 71 y.o. female with a past medical history significant for COPD not on home O2, HTN, and ankylosing spondylitis not on immunesuppression who presents with fever  The was admitted 2 days ago with wheezing, COPD exacerbation. She was discharged after one day with a Z-Pak and prednisone taper.   Since discharge, the patient developed a fever that won't improve with acetaminophen, nausea, and diarrhea, as well as severe malaise, weakness, aches, chills, and worsening of her cough with new sputum production, so she came back to the ER.  ED course: -Temp 103F, heart rate 107, respirations 28, BP 167/91, Pulse ox normal -Na 136, K 3.7, Cr 0.85 (an initial BMP showed spurious electrolytes), WBC 6.7K, Hgb 14 -Lactic acid 1.03 -CXR showed a possible new patchy right lower lobe opacity -Cultures were obtained, 1 L normal saline and Levaquin was administered, and TRH were asked to admit for pneumonia    ROS: Review of Systems  Constitutional: Positive for chills, fever and malaise/fatigue.  HENT: Positive for congestion.   Respiratory: Positive for cough and sputum production. Negative for shortness of breath and wheezing.   Gastrointestinal: Positive for diarrhea and nausea.  Musculoskeletal: Positive for myalgias.  Neurological: Positive for weakness.  All other systems reviewed and are negative.         Past Medical History:  Diagnosis Date  . Ankylosing spondylitis ([place])   . Anxiety   . Arthritis   . Chronic lower back pain   . CKD (chronic kidney disease), stage II    "related to the Ankylosing spondylitis" (04/10/2016)  . COPD (chronic obstructive pulmonary disease) ([place])   . Eye disease   . Hypertension   . PONV (postoperative nausea and vomiting)    "didn't  bother me the last time I had it in ~ 2015"    Past Surgical History:  Procedure Laterality Date  . BROW LIFT AND BLEPHAROPLASTY Bilateral ~ 2015  . CATARACT EXTRACTION W/ INTRAOCULAR LENS  IMPLANT, BILATERAL Bilateral 2000s  . MANDIBLE SURGERY  ~ 2000   "jaw had moved to left; broke my jaw in 5 places; put titanium in; stripped muscle from bone; wired jaw shut"  . TUBAL LIGATION  1979  . VAGINAL HYSTERECTOMY  1985    Social History: Patient lives alone.  The patient walks unassisted, and still drives.  She is trying to quit smoking, was recently quit and only started again in the last week before her admission 3 days ago, now states that she will quit for good.  From [place] originally.  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  Divorced.    Allergies  Allergen Reactions  . Amoxapine And Related     Patient reports it did not work for her.     Family history: family history includes Alzheimer's disease in her brother; Diabetes in her sister; Heart disease in her sister; Hypertension in her mother; Pulmonary fibrosis in her brother.  Prior to Admission medications   Medication Sig Start Date End Date Taking? Authorizing Provider  albuterol (PROVENTIL HFA;VENTOLIN HFA) 108 (90 Base) MCG/ACT inhaler Inhale 2 puffs into the lungs every 4 (four) hours as needed for wheezing or shortness of breath (cough, shortness of breath or wheezing.). 04/13/16  Yes Ezequiel  Coralyn Pear, MD  fluorometholone (FML) 0.1 % ophthalmic suspension Place 1 drop into both eyes 3 (three) times daily. 08/13/16  Yes Historical Provider, MD  losartan (COZAAR) 100 MG tablet Take 100 mg by mouth daily.   Yes Historical Provider, MD  mirtazapine (REMERON) 45 MG tablet Take 45 mg by mouth at bedtime. 08/19/16  Yes Historical Provider, MD  predniSONE (DELTASONE) 20 MG tablet Take 1-2 tablets (20-40 mg total) by mouth daily with breakfast. Take 40mg  for 2days then 20mg  for 2days then STOP 09/22/16  Yes Domenic Polite, MD    sulfaSALAzine (AZULFIDINE) 500 MG tablet Take 500 mg by mouth daily as needed (arthritis).   Yes Historical Provider, MD  sulindac (CLINORIL) 150 MG tablet Take 150 mg by mouth daily as needed (arthiritis).    Yes Historical Provider, MD  methotrexate (RHEUMATREX) 2.5 MG tablet Take 7.5 mg by mouth every 7 (seven) days.  07/16/16   Historical Provider, MD       Physical Exam: BP 132/63   Pulse 93   Temp 103 F (39.4 C) (Rectal)   Resp (!) 32   Ht 5\' 7"  (1.702 m)   Wt 75.8 kg (167 lb)   SpO2 94%   BMI 26.16 kg/m  General appearance: Well-developed, older adult female, alert and in moderate distress from malaise, lying in bed, keeps eyes closed, appears tired.   Eyes: Anicteric, conjunctiva pink, lids and lashes normal. PERRL.    ENT: No nasal deformity, discharge, epistaxis.  Hearing normal. OP dry without lesions.   Neck: No neck masses.  Trachea midline.  No thyromegaly/tenderness. Lymph: No cervical or supraclavicular lymphadenopathy. Skin: Hot, moist.  No jaundice.  No suspicious rashes or lesions. Cardiac: RRR, nl S1-S2, no murmurs appreciated.  Capillary refill is brisk.  JVP normal.  No LE edema.  Radial and DP pulses 2+ and symmetric. Respiratory: Tachypneic.  Coarse breath sounds at both bases, diminished on right. Abdomen: Abdomen soft.  No TTP. No ascites, distension, hepatosplenomegaly.   MSK: No deformities or effusions.  No cyanosis or clubbing. Neuro: Cranial nerves normal.  Sensation intact to light touch. Speech is fluent.  Muscle strength globally weak.    Psych: Sensorium intact and responding to questions, attention diminished by malaise.  Behavior appropriate.  Affect blunted.  Judgment and insight appear normal.     Labs on Admission:  I have personally reviewed following labs and imaging studies: CBC:  Recent Labs Lab 09/21/16 1039 09/22/16 0714 09/23/16 2317  WBC 5.2 6.2 6.7  NEUTROABS  --   --  4.8  HGB 13.4 12.3 14.0  HCT 38.7 35.7* 41.1  MCV  89.8 91.8 89.9  PLT 175 170 277   Basic Metabolic Panel:  Recent Labs Lab 09/21/16 1039 09/22/16 0714 09/23/16 2317 09/24/16 0034  NA 139 140 157* 136  K 3.5 4.2 3.6 3.7  CL 106 107 84* 102  CO2 24 27 21* 25  GLUCOSE 158* 123* 95 103*  BUN 11 17 14 15   CREATININE 0.79 0.89 0.83 0.85  CALCIUM 9.3 8.9 5.4* 8.7*   GFR: Estimated Creatinine Clearance: 66.4 mL/min (by C-G formula based on SCr of 0.85 mg/dL).  Liver Function Tests:  Recent Labs Lab 09/23/16 2317  AST 20  ALT 18  ALKPHOS 49  BILITOT 1.0  PROT 5.8*  ALBUMIN 3.5   No results for input(s): LIPASE, AMYLASE in the last 168 hours. No results for input(s): AMMONIA in the last 168 hours. Coagulation Profile: No results for input(s): INR, PROTIME  in the last 168 hours. Cardiac Enzymes: No results for input(s): CKTOTAL, CKMB, CKMBINDEX, TROPONINI in the last 168 hours. BNP (last 3 results) No results for input(s): PROBNP in the last 8760 hours. HbA1C:  Recent Labs  09/21/16 1532  HGBA1C 4.2*   CBG: No results for input(s): GLUCAP in the last 168 hours. Lipid Profile: No results for input(s): CHOL, HDL, LDLCALC, TRIG, CHOLHDL, LDLDIRECT in the last 72 hours. Thyroid Function Tests: No results for input(s): TSH, T4TOTAL, FREET4, T3FREE, THYROIDAB in the last 72 hours. Anemia Panel: No results for input(s): VITAMINB12, FOLATE, FERRITIN, TIBC, IRON, RETICCTPCT in the last 72 hours. Sepsis Labs: Lactic acid 1.03 Invalid input(s): PROCALCITONIN, LACTICIDVEN No results found for this or any previous visit (from the past 240 hour(s)).       Radiological Exams on Admission: Personally reviewed CXR shows patchy opacity in right base in PA and lateral, mild worse from previous chest x-ray: Dg Chest 2 View  Result Date: 09/23/2016 CLINICAL DATA:  Cough and fever tonight EXAM: CHEST  2 VIEW COMPARISON:  09/21/2016 FINDINGS: Poorly defined infiltration in the right lung base posteriorly probably representing  early pneumonia. Left lung is clear. No blunting of costophrenic angles. No pneumothorax. Heart size and pulmonary vascularity are normal. Mediastinal contours appear intact. IMPRESSION: Focal infiltration in the right lung base posteriorly probably representing early pneumonia. Electronically Signed   By: Lucienne Capers M.D.   On: 09/23/2016 23:30        Assessment/Plan  1. Community acquired pneumonia:  Fever, worsening productive cough, new infiltrate in RLL on CXR.  PSI score 79, class III. -Levaquin 750 mg IV daily -Obtain influenza PCR and maintain droplet -Check procalcitonin per protocol -Obtain strep urine antigens -Follow blood and sputum cultures -Guiafenesin and Inc spiro   2. COPD exacerbation:  Resolving. -Continue prednisone taper for 2 more days -Albuterol PRN  3. Smoking:  Cessation encouraged.  4. Depression:  -Continue Mirtazapine  5. Hypertension:  Hypertensive on admission but tachycardic, tachypneic -Hold Cozaar until hemodynamics clearer  6. Ankylosing spondylitis:  -Continue eye drops         DVT prophylaxis: Lovenox  Code Status: FULL  Family Communication: None present  Disposition Plan: Anticipate IV Levaquin and monitor respiratory status and fever curve and discharge with HR improved and tolerating PO. Consults called: None Admission status: OBS for now At the point of initial evaluation, it is my clinical opinion that admission for OBSERVATION is reasonable and necessary because the patient's presenting complaints in the context of their chronic conditions represent sufficient risk of deterioration or significant morbidity to constitute reasonable grounds for close observation in the hospital setting, but that the patient may be medically stable for discharge from the hospital within 24 to 48 hours.    Medical decision making: Patient seen at 2:32 AM on 09/24/2016.  The patient was discussed with Dr. Leonides Schanz.  What exists of the  patient's chart was reviewed in depth and summarized above.  Clinical condition: stable.        Edwin Dada [facility] Pager [phone]

## 2016-09-24 NOTE — ED Notes (Signed)
[place], MD at bedside. 

## 2016-09-25 LAB — CBC
HCT: 34.4 % — ABNORMAL LOW (ref 36.0–46.0)
Hemoglobin: 11.7 g/dL — ABNORMAL LOW (ref 12.0–15.0)
MCH: 30.2 pg (ref 26.0–34.0)
MCHC: 34 g/dL (ref 30.0–36.0)
MCV: 88.9 fL (ref 78.0–100.0)
PLATELETS: 149 10*3/uL — AB (ref 150–400)
RBC: 3.87 MIL/uL (ref 3.87–5.11)
RDW: 14.7 % (ref 11.5–15.5)
WBC: 4.7 10*3/uL (ref 4.0–10.5)

## 2016-09-25 LAB — BASIC METABOLIC PANEL
Anion gap: 9 (ref 5–15)
BUN: 13 mg/dL (ref 6–20)
CO2: 24 mmol/L (ref 22–32)
CREATININE: 0.89 mg/dL (ref 0.44–1.00)
Calcium: 8.3 mg/dL — ABNORMAL LOW (ref 8.9–10.3)
Chloride: 104 mmol/L (ref 101–111)
GFR calc Af Amer: >60 mL/min (ref >60)
GLUCOSE: 137 mg/dL — AB (ref 65–99)
Potassium: 3.5 mmol/L (ref 3.5–5.1)
SODIUM: 137 mmol/L (ref 135–145)

## 2016-09-25 LAB — EXPECTORATED SPUTUM ASSESSMENT W REFEX TO RESP CULTURE

## 2016-09-25 LAB — EXPECTORATED SPUTUM ASSESSMENT W GRAM STAIN, RFLX TO RESP C

## 2016-09-25 MED ORDER — BUSPIRONE HCL 15 MG PO TABS
7.5000 mg | ORAL_TABLET | Freq: Two times a day (BID) | ORAL | Status: DC
  Administered 2016-09-25 – 2016-09-26 (×3): 7.5 mg via ORAL
  Filled 2016-09-25 (×4): qty 1

## 2016-09-25 MED ORDER — ZOLPIDEM TARTRATE 5 MG PO TABS
5.0000 mg | ORAL_TABLET | Freq: Every evening | ORAL | Status: DC | PRN
  Administered 2016-09-25: 5 mg via ORAL
  Filled 2016-09-25: qty 1

## 2016-09-25 MED ORDER — HYDROCOD POLST-CPM POLST ER 10-8 MG/5ML PO SUER
5.0000 mL | Freq: Once | ORAL | Status: AC
  Administered 2016-09-25: 5 mL via ORAL
  Filled 2016-09-25: qty 5

## 2016-09-25 MED ORDER — HYDROXYZINE HCL 25 MG PO TABS
25.0000 mg | ORAL_TABLET | Freq: Three times a day (TID) | ORAL | Status: DC | PRN
Start: 2016-09-25 — End: 2016-09-26

## 2016-09-25 MED ORDER — SACCHAROMYCES BOULARDII 250 MG PO CAPS
250.0000 mg | ORAL_CAPSULE | Freq: Two times a day (BID) | ORAL | Status: DC
  Administered 2016-09-25 – 2016-09-26 (×2): 250 mg via ORAL
  Filled 2016-09-25 (×2): qty 1

## 2016-09-25 NOTE — Care Management Note (Signed)
Case Management Note  Patient Details  Name: Kirsten Harper MRN: [id number] Date of Birth: Mar 29, 1946  Subjective/Objective:          Patient was admitted with fever, CAP. Lives at home alone. CM will follow for discharge needs pending patient's progress and physician orders.           Action/Plan:   Expected Discharge Date:  09/24/16               Expected Discharge Plan:     In-House Referral:     Discharge planning Services     Post Acute Care Choice:    Choice offered to:     DME Arranged:    DME Agency:     HH Arranged:    HH Agency:     Status of Service:     If discussed at [employer], dates discussed:    Additional Comments:  Rolm Baptise, RN 09/25/2016, 11:55 AM

## 2016-09-25 NOTE — Progress Notes (Signed)
Pt alert with  lost of anxiety . very impulsive and does not call before getting up.  Instructed on safety measures and  To call  Nsg staff who sits across the hall way from pt's room. Staff are in pt's room  In/out after every 30 mins to 1 hour to assist pt  As needed. At 0500 pt stated has had loose b m x 5 . RN witness x 3  which were small or smears of brown to yellow stool in  Pt underwear and tissues when she cough. However. MD on call made aware. No orders given as pt has been started on IV antibiotics  With ist dose at 0200. RN continue to provide emotional support and reassured pt .

## 2016-09-25 NOTE — Progress Notes (Signed)
[facility] Progress Note  Patient: Kirsten Harper GYJ:[id number]   PCP: Osborne Casco, MD DOB: September 04, 1945   DOA: 09/23/2016   DOS: 09/25/2016   Date of Service: the patient was seen and examined on 09/25/2016  Subjective: Has significant diarrhea overnight, also has significant anxiety. Early this morning was also febrile 102  Brief hospital course: Pt. with PMH of COPD, HTN; admitted on 09/23/2016, with complaint of shortness of breath, was found to have COPD exacerbation. Currently further plan is continue IV antibiotics.  Assessment and Plan: 1. Community acquired pneumonia of right lower lobe of lung ([place]) Continue IV Levaquin, will monitor cultures for 48 hours clearance. Currently no growth. Has diarrhea we will add probiotics. No evidence of seeding for toxic megacolon. No abdominal pain and leukocytosis. May require Imodium  2. COPD exacerbation:  Resolving. -Continue prednisone taper for 2 more days -Albuterol PRN  3. Smoking:  Cessation encouraged.  4. Depression:  -Continue Mirtazapine  5. Hypertension:  Hypertensive on admission but tachycardic, tachypneic -Hold Cozaar until hemodynamics clearer  6. Ankylosing spondylitis:  -Continue eye drops  Bowel regimen: last BM 09/25/2016 Diet: Cardiac diet DVT Prophylaxis: subcutaneous Heparin  Advance goals of care discussion: Full code  Family Communication: no family was present at bedside, at the time of interview.  Disposition:  Discharge to home. Expected discharge date: 09/26/2016,  Consultants: none Procedures: none  Antibiotics: Anti-infectives    Start     Dose/Rate Route Frequency Ordered Stop   09/25/16 0200  levofloxacin (LEVAQUIN) IVPB 750 mg     750 mg 100 mL/hr over 90 Minutes Intravenous Every 24 hours 09/24/16 0322 09/30/16 0159   09/24/16 0000  levofloxacin (LEVAQUIN) IVPB 750 mg     750 mg 100 mL/hr over 90 Minutes Intravenous  Once 09/23/16 2358 09/24/16 0312        Objective: Physical Exam: Vitals:   09/25/16 0502 09/25/16 0900 09/25/16 1306 09/25/16 1800  BP: 138/67 139/72 (!) 143/68 140/73  Pulse: 85 89 86 97  Resp: 20 18 20 20   Temp: 100 F (37.8 C) 99.1 F (37.3 C) 99.3 F (37.4 C) 98.9 F (37.2 C)  TempSrc: Oral Oral Oral Oral  SpO2: 95% 97% 97% 95%  Weight:      Height:        Intake/Output Summary (Last 24 hours) at 09/25/16 1953 Last data filed at 09/25/16 0600  Gross per 24 hour  Intake          1323.33 ml  Output                2 ml  Net          1321.33 ml   Filed Weights   09/23/16 2304 09/24/16 0600  Weight: 75.8 kg (167 lb) 77.5 kg (170 lb 14.4 oz)   General: Alert, Awake and Oriented to Time, Place and Person. Appear in mild distress, affect appropriate Eyes: PERRL, Conjunctiva normal ENT: Oral Mucosa clear moist. Neck: no JVD, no Abnormal Mass Or lumps Cardiovascular: S1 and S2 Present, no Murmur, Respiratory: Bilateral Air entry equal and Decreased, no use of accessory muscle, no crackles, bilateral wheezes Abdomen: Bowel Sound present, Soft and no tenderness Skin: no redness, no Rash, no induration Extremities: no Pedal edema, no calf tenderness Neurologic: Grossly no focal neuro deficit. Bilaterally Equal motor strength  Data Reviewed: CBC:  Recent Labs Lab 09/21/16 1039 09/22/16 0714 09/23/16 2317 09/24/16 0428 09/25/16 0322  WBC 5.2 6.2 6.7 5.2 4.7  NEUTROABS  --   --  4.8  --   --   HGB 13.4 12.3 14.0 11.3* 11.7*  HCT 38.7 35.7* 41.1 32.8* 34.4*  MCV 89.8 91.8 89.9 89.1 88.9  PLT 175 170 175 156 017*   Basic Metabolic Panel:  Recent Labs Lab 09/22/16 0714 09/23/16 2317 09/24/16 0034 09/24/16 0428 09/25/16 0322  NA 140 157* 136 136 137  K 4.2 3.6 3.7 3.5 3.5  CL 107 84* 102 105 104  CO2 27 21* 25 23 24   GLUCOSE 123* 95 103* 153* 137*  BUN 17 14 15 15 13   CREATININE 0.89 0.83 0.85 0.88 0.89  CALCIUM 8.9 5.4* 8.7* 8.0* 8.3*    Liver Function Tests:  Recent Labs Lab  09/23/16 2317  AST 20  ALT 18  ALKPHOS 49  BILITOT 1.0  PROT 5.8*  ALBUMIN 3.5   No results for input(s): LIPASE, AMYLASE in the last 168 hours. No results for input(s): AMMONIA in the last 168 hours. Coagulation Profile: No results for input(s): INR, PROTIME in the last 168 hours. Cardiac Enzymes: No results for input(s): CKTOTAL, CKMB, CKMBINDEX, TROPONINI in the last 168 hours. BNP (last 3 results) No results for input(s): PROBNP in the last 8760 hours. CBG: No results for input(s): GLUCAP in the last 168 hours. Studies: No results found.  Scheduled Meds: . busPIRone  7.5 mg Oral BID  . enoxaparin (LOVENOX) injection  40 mg Subcutaneous Q24H  . fluorometholone  1 drop Both Eyes TID  . levofloxacin (LEVAQUIN) IV  750 mg Intravenous Q24H  . mirtazapine  45 mg Oral QHS  . saccharomyces boulardii  250 mg Oral BID   Continuous Infusions: PRN Meds: acetaminophen **OR** acetaminophen, hydrOXYzine, ondansetron **OR** ondansetron (ZOFRAN) IV, zolpidem  Time spent: 30 minutes  Author: Berle Mull, MD [facility] Pager: [phone] 09/25/2016 7:53 PM  If 7PM-7AM, please contact night-coverage at [email or web address], password [email or web address]

## 2016-09-26 LAB — PROCALCITONIN: Procalcitonin: <0.1 ng/mL

## 2016-09-26 MED ORDER — BUSPIRONE HCL 7.5 MG PO TABS: 7.5000 mg | ORAL_TABLET | Freq: Two times a day (BID) | ORAL | 0 refills | Status: DC

## 2016-09-26 MED ORDER — LEVOFLOXACIN 750 MG PO TABS: 750.0000 mg | ORAL_TABLET | Freq: Every day | ORAL | Status: DC

## 2016-09-26 MED ORDER — SACCHAROMYCES BOULARDII 250 MG PO CAPS: 250.0000 mg | ORAL_CAPSULE | Freq: Two times a day (BID) | ORAL | 0 refills | Status: DC

## 2016-09-26 MED ORDER — HYDROXYZINE HCL 25 MG PO TABS: 25.0000 mg | ORAL_TABLET | Freq: Three times a day (TID) | ORAL | 0 refills | Status: DC | PRN

## 2016-09-26 MED ORDER — LEVOFLOXACIN 750 MG PO TABS: 750.0000 mg | ORAL_TABLET | Freq: Every day | ORAL | 0 refills | Status: AC

## 2016-09-26 NOTE — Progress Notes (Signed)
Walked with patient for about 39ft. On room air--O2 = 94%-96%

## 2016-09-26 NOTE — Care Management Note (Signed)
Case Management Note  Patient Details  Name: Kirsten Harper MRN: [id number] Date of Birth: [birth year]  Subjective/Objective:                    Action/Plan: Pt discharging home with self care. Pt states she has transportation home. Pt with insurance and PCP. No further needs per CM.  Expected Discharge Date:  09/26/16               Expected Discharge Plan:  Home/Self Care  In-House Referral:     Discharge planning Services     Post Acute Care Choice:    Choice offered to:     DME Arranged:    DME Agency:     HH Arranged:    HH Agency:     Status of Service:  Completed, signed off  If discussed at [employer] Meetings, dates discussed:    Additional Comments:  Pollie Friar, RN 09/26/2016, 3:48 PM

## 2016-09-26 NOTE — Consult Note (Signed)
   [facility] CM Inpatient Consult   09/26/2016  Kirsten Harper [birth year] [id number]   Patient was evaluated for re-hospitalizations in the past 6 months for the [facility] ACO registry.  Patient was admitted with pneumonia, chart review reveals the patient admitted with PMH of COPD, HTN; admitted on 09/23/2016, with complaint of shortness of breath, was found to have COPD exacerbation. Met with the patient at the bedside.  Patient is quite anxious stating she is suppose to be discharge and has to be at the entrance at 4 pm as her parents are picking her up.  Patient states she would like follow up calls.  Explained the EMMI COPD/Pneumonia follow up calls.  Patient endorses Dr. Kelton Pillar as her primary care provider.  Patient was given a brochure, magnet with 24 hour nurse line and contact information.  Patient said she was appreciative of the follow up.  For questions, please contact:  Natividad Brood, RN BSN [facility] Liaison  [phone] business mobile phone Toll free office [phone]

## 2016-09-27 LAB — CULTURE, RESPIRATORY W GRAM STAIN: Culture: NORMAL

## 2016-09-27 LAB — CULTURE, RESPIRATORY

## 2016-09-29 LAB — CULTURE, BLOOD (ROUTINE X 2)
CULTURE: NO GROWTH
CULTURE: NO GROWTH

## 2016-09-30 DIAGNOSIS — I129 Hypertensive chronic kidney disease with stage 1 through stage 4 chronic kidney disease, or unspecified chronic kidney disease: Secondary | ICD-10-CM | POA: Diagnosis not present

## 2016-09-30 DIAGNOSIS — J189 Pneumonia, unspecified organism: Secondary | ICD-10-CM | POA: Diagnosis not present

## 2016-09-30 DIAGNOSIS — J449 Chronic obstructive pulmonary disease, unspecified: Secondary | ICD-10-CM | POA: Diagnosis not present

## 2016-09-30 DIAGNOSIS — F39 Unspecified mood [affective] disorder: Secondary | ICD-10-CM | POA: Diagnosis not present

## 2016-09-30 DIAGNOSIS — F17210 Nicotine dependence, cigarettes, uncomplicated: Secondary | ICD-10-CM | POA: Diagnosis not present

## 2016-09-30 DIAGNOSIS — F411 Generalized anxiety disorder: Secondary | ICD-10-CM | POA: Diagnosis not present

## 2016-09-30 DIAGNOSIS — N183 Chronic kidney disease, stage 3 (moderate): Secondary | ICD-10-CM | POA: Diagnosis not present

## 2016-09-30 NOTE — Discharge Summary (Signed)
[facility] Discharge Summary   Patient: Kirsten Harper SEG:[id number]   PCP: Osborne Casco, MD DOB: 1946-02-06   Date of admission: 09/23/2016   Date of discharge: 09/26/2016    Discharge Diagnoses:  Principal Problem:   Community acquired pneumonia of right lower lobe of lung ([place]) Active Problems:   COPD exacerbation ([place])   Essential hypertension   Smoker   Major depressive disorder, recurrent severe without psychotic features ([place])   Ankylosing spondylitis of lumbosacral region [facility])   Admitted From: home Disposition:  home  Recommendations for Outpatient Follow-up:  1. Please follow-up with PCP in 1-2 weeks   Follow-up Information    Osborne Casco, MD. Schedule an appointment as soon as possible for a visit in 1 week(s).   Specialty:  Family Medicine Contact information: [address] [place] [address] [phone]          Diet recommendation: Cardiac diet  Activity: The patient is advised to gradually reintroduce usual activities.  Discharge Condition: good  Code Status: Full code  History of present illness: As per the H and P dictated on admission, "Kirsten Harper is a 71 y.o. female with a past medical history significant for COPD not on home O2, HTN, and ankylosing spondylitis not on immunesuppression who presents with fever  The was admitted 2 days ago with wheezing, COPD exacerbation. She was discharged after one day with a Z-Pak and prednisone taper.   Since discharge, the patient developed a fever that won't improve with acetaminophen, nausea, and diarrhea, as well as severe malaise, weakness, aches, chills, and worsening of her cough with new sputum production, so she came back to the ER."  Hospital Course:   Summary of her active problems in the hospital is as following. 1. Community acquired pneumonia of right lower lobe of lung ([place]) Continue Levaquin, blood cultures negative for  48 hours Has diarrhea  we will add probiotics. No evidence of C diff or toxic megacolon. No abdominal pain and leukocytosis.  2. COPD exacerbation: Resolved -Continue prednisone taper for 2 more days -Albuterol PRN  3. Smoking: Cessation encouraged.  4. Depression: -Continue Mirtazapine Buspirone added as per recent recommendation from psychiatry.  5. Hypertension: Hypertensive on admission but tachycardic, tachypneic -Hold Cozaar until hemodynamics clearer  6. Ankylosing spondylitis: -Continue eye drops  All other chronic medical condition were stable during the hospitalization.  Patient was ambulatory without any assistance. On the day of the discharge the patient's [place], and no other acute medical condition were reported by patient. the patient was felt safe to be discharge at home with family.  Procedures and Results:  none   Consultations:  none  DISCHARGE MEDICATION: Discharge Medication List as of 09/26/2016  3:10 PM    START taking these medications   Details  busPIRone (BUSPAR) 7.5 MG tablet Take 1 tablet (7.5 mg total) by mouth 2 (two) times daily., Starting Thu 09/26/2016, Normal    hydrOXYzine (ATARAX/VISTARIL) 25 MG tablet Take 1 tablet (25 mg total) by mouth 3 (three) times daily as needed for anxiety., Starting Thu 09/26/2016, Normal    levofloxacin (LEVAQUIN) 750 MG tablet Take 1 tablet (750 mg total) by mouth daily., Starting Fri 09/27/2016, Until Sun 09/29/2016, Normal    saccharomyces boulardii (FLORASTOR) 250 MG capsule Take 1 capsule (250 mg total) by mouth 2 (two) times daily., Starting Thu 09/26/2016, Normal      CONTINUE these medications which have NOT CHANGED   Details  albuterol (PROVENTIL HFA;VENTOLIN  HFA) 108 (90 Base) MCG/ACT inhaler Inhale 2 puffs into the lungs every 4 (four) hours as needed for wheezing or shortness of breath (cough, shortness of breath or wheezing.)., Starting Sat 04/13/2016, Print    fluorometholone (FML) 0.1 %  ophthalmic suspension Place 1 drop into both eyes 3 (three) times daily., Starting Tue 08/13/2016, Historical Med    mirtazapine (REMERON) 45 MG tablet Take 45 mg by mouth at bedtime., Starting Mon 08/19/2016, Historical Med    sulfaSALAzine (AZULFIDINE) 500 MG tablet Take 500 mg by mouth daily as needed (arthritis)., Historical Med    sulindac (CLINORIL) 150 MG tablet Take 150 mg by mouth daily as needed (arthiritis). , Historical Med    methotrexate (RHEUMATREX) 2.5 MG tablet Take 7.5 mg by mouth every 7 (seven) days. , Starting Tue 07/16/2016, Historical Med      STOP taking these medications     losartan (COZAAR) 100 MG tablet      predniSONE (DELTASONE) 20 MG tablet        Allergies  Allergen Reactions  . Amoxapine And Related     Patient reports it did not work for her.    Discharge Instructions    AMB Referral to [facility]    Complete by:  As directed    Reason for consult:  Post hospital EMMI pneumonia  calls   Diagnoses of:  COPD/ Pneumonia   Expected date of contact:  1-3 days (reserved for hospital discharges)   Please assign the patient to EMMI Pneumonia follow up.  For questions, please contact,  Natividad Brood, RN BSN [facility] Liaison  [phone] business mobile phone Toll free office [phone]   Diet - low sodium heart healthy    Complete by:  As directed    Discharge instructions    Complete by:  As directed    It is important that you read following instructions as well as go over your medication list with RN to help you understand your care after this hospitalization.  Discharge Instructions: Please follow-up with PCP in one week  Please request your primary care physician to go over all Hospital Tests and Procedure/Radiological results at the follow up,  Please get all Hospital records sent to your PCP by signing hospital release before you go home.   Do not take more than prescribed Pain, Sleep and Anxiety  Medications. You were cared for by a hospitalist during your hospital stay. If you have any questions about your discharge medications or the care you received while you were in the hospital after you are discharged, you can call the unit and ask to speak with the hospitalist on call if the hospitalist that took care of you is not available.  Once you are discharged, your primary care physician will handle any further medical issues. Please note that NO REFILLS for any discharge medications will be authorized once you are discharged, as it is imperative that you return to your primary care physician (or establish a relationship with a primary care physician if you do not have one) for your aftercare needs so that they can reassess your need for medications and monitor your lab values. You Must read complete instructions/literature along with all the possible adverse reactions/side effects for all the Medicines you take and that have been prescribed to you. Take any new Medicines after you have completely understood and accept all the possible adverse reactions/side effects. Wear Seat belts while driving. If you have smoked or chewed Tobacco in  the last 2 yrs please stop smoking and/or stop any Recreational drug use.   Increase activity slowly    Complete by:  As directed      Discharge Exam: Filed Weights   09/23/16 2304 09/24/16 0600  Weight: 75.8 kg (167 lb) 77.5 kg (170 lb 14.4 oz)   Vitals:   09/26/16 0942 09/26/16 1449  BP: 139/70 133/69  Pulse: 92 87  Resp: 18 19  Temp: 98.6 F (37 C) 98.7 F (37.1 C)   General: Appear in no distress, no Rash; Oral Mucosa moist. Cardiovascular: S1 and S2 Present, no Murmur, no JVD Respiratory: Bilateral Air entry present and Clear to Auscultation, no Crackles, no wheezes Abdomen: Bowel Sound present, Soft and no tenderness Extremities: no Pedal edema, no calf tenderness Neurology: Grossly no focal neuro deficit.  The results of significant  diagnostics from this hospitalization (including imaging, microbiology, ancillary and laboratory) are listed below for reference.    Significant Diagnostic Studies: Dg Chest 2 View  Result Date: 09/23/2016 CLINICAL DATA:  Cough and fever tonight EXAM: CHEST  2 VIEW COMPARISON:  09/21/2016 FINDINGS: Poorly defined infiltration in the right lung base posteriorly probably representing early pneumonia. Left lung is clear. No blunting of costophrenic angles. No pneumothorax. Heart size and pulmonary vascularity are normal. Mediastinal contours appear intact. IMPRESSION: Focal infiltration in the right lung base posteriorly probably representing early pneumonia. Electronically Signed   By: Lucienne Capers M.D.   On: 09/23/2016 23:30   Dg Chest 2 View  Result Date: 09/21/2016 CLINICAL DATA:  Cardiac palpitations EXAM: CHEST  2 VIEW COMPARISON:  08/26/16 FINDINGS: The heart size and mediastinal contours are within normal limits. Both lungs are clear. The visualized skeletal structures are unremarkable. IMPRESSION: No active cardiopulmonary disease. Electronically Signed   By: Inez Catalina M.D.   On: 09/21/2016 11:12    Microbiology: Recent Results (from the past 240 hour(s))  Blood culture (routine x 2)     Status: None   Collection Time: 09/24/16 12:00 AM  Result Value Ref Range Status   Specimen Description BLOOD LEFT ANTECUBITAL  Final   Special Requests BOTTLES DRAWN AEROBIC ONLY 5CC  Final   Culture NO GROWTH 5 DAYS  Final   Report Status 09/29/2016 FINAL  Final  Blood culture (routine x 2)     Status: None   Collection Time: 09/24/16 12:15 AM  Result Value Ref Range Status   Specimen Description BLOOD RIGHT FOREARM  Final   Special Requests BOTTLES DRAWN AEROBIC ONLY 5CC  Final   Culture NO GROWTH 5 DAYS  Final   Report Status 09/29/2016 FINAL  Final  Culture, sputum-assessment     Status: None   Collection Time: 09/25/16  6:50 AM  Result Value Ref Range Status   Specimen Description  SPUTUM  Final   Special Requests NONE  Final   Sputum evaluation THIS SPECIMEN IS ACCEPTABLE FOR SPUTUM CULTURE  Final   Report Status 09/25/2016 FINAL  Final  Culture, respiratory (NON-Expectorated)     Status: None   Collection Time: 09/25/16  6:50 AM  Result Value Ref Range Status   Specimen Description SPUTUM  Final   Special Requests NONE Reflexed from [id number]  Final   Gram Stain   Final    ABUNDANT WBC PRESENT,BOTH PMN AND MONONUCLEAR FEW GRAM POSITIVE COCCI IN PAIRS FEW GRAM VARIABLE ROD RARE SQUAMOUS EPITHELIAL CELLS PRESENT    Culture Consistent with normal respiratory flora.  Final   Report Status 09/27/2016 FINAL  Final  Labs: CBC:  Recent Labs Lab 09/23/16 2317 09/24/16 0428 09/25/16 0322  WBC 6.7 5.2 4.7  NEUTROABS 4.8  --   --   HGB 14.0 11.3* 11.7*  HCT 41.1 32.8* 34.4*  MCV 89.9 89.1 88.9  PLT 175 156 902*   Basic Metabolic Panel:  Recent Labs Lab 09/23/16 2317 09/24/16 0034 09/24/16 0428 09/25/16 0322  NA 157* 136 136 137  K 3.6 3.7 3.5 3.5  CL 84* 102 105 104  CO2 21* 25 23 24   GLUCOSE 95 103* 153* 137*  BUN 14 15 15 13   CREATININE 0.83 0.85 0.88 0.89  CALCIUM 5.4* 8.7* 8.0* 8.3*   Liver Function Tests:  Recent Labs Lab 09/23/16 2317  AST 20  ALT 18  ALKPHOS 49  BILITOT 1.0  PROT 5.8*  ALBUMIN 3.5   BNP (last 3 results)  Recent Labs  04/10/16 1900  BNP 30.4   CBG: No results for input(s): GLUCAP in the last 168 hours. Time spent: 30 minutes  Signed:  Arika Mainer  [facility] 09/26/2016 , 9:13 AM

## 2016-10-09 DIAGNOSIS — F329 Major depressive disorder, single episode, unspecified: Secondary | ICD-10-CM | POA: Diagnosis not present

## 2016-10-10 ENCOUNTER — Other Ambulatory Visit: Payer: Self-pay

## 2016-10-10 NOTE — Patient Outreach (Signed)
Kirsten Harper) Care Management  10/10/2016  Kirsten Harper 10/19/1945 [id number]   EMMI pneumonia RED alert: Day #1 REFERRAL DATE; 10/10/16 REFERRAL REASON; Scheduled follow up appointment: NO  Telephone call to patient regarding EMMI pneumonia red alert. Unable to reach patient at this time. Unable to leave voice message on phone.  Message states, " the wireless customer is not available at this time."  PLAN; RNCM will attempt 2nd telephone outreach to patient within 1 week   Quinn Plowman RN,BSN,CCM [facility] Telephonic  [phone]

## 2016-10-10 NOTE — Patient Outreach (Signed)
[facility]) Care Management  10/10/2016  Kirsten Harper 21-Jul-1945 [id number]   Telephone call to patient regarding EMMI pneumonia. Unable to reach patient. HIPAA compliant voice message left with call back phone number.   PLAN; RNCM will attempt 2nd telephone outreach to patient within 1 week.   Quinn Plowman RN,BSN,CCM [facility] Telephonic  [phone]

## 2016-10-11 ENCOUNTER — Ambulatory Visit: Payer: Self-pay

## 2016-10-12 ENCOUNTER — Encounter (HOSPITAL_COMMUNITY): Payer: Self-pay

## 2016-10-12 ENCOUNTER — Emergency Department (HOSPITAL_COMMUNITY)
Admission: EM | Admit: 2016-10-12 | Discharge: 2016-10-15 | Disposition: A | Discharge status: Other Acute Inpatient Hospital | Payer: Medicare Other | Attending: Emergency Medicine | Admitting: Emergency Medicine

## 2016-10-12 DIAGNOSIS — F411 Generalized anxiety disorder: Secondary | ICD-10-CM | POA: Diagnosis not present

## 2016-10-12 DIAGNOSIS — Z79899 Other long term (current) drug therapy: Secondary | ICD-10-CM | POA: Insufficient documentation

## 2016-10-12 DIAGNOSIS — N182 Chronic kidney disease, stage 2 (mild): Secondary | ICD-10-CM | POA: Insufficient documentation

## 2016-10-12 DIAGNOSIS — Z810 Family history of intellectual disabilities: Secondary | ICD-10-CM | POA: Diagnosis not present

## 2016-10-12 DIAGNOSIS — R45851 Suicidal ideations: Secondary | ICD-10-CM | POA: Diagnosis not present

## 2016-10-12 DIAGNOSIS — Z008 Encounter for other general examination: Secondary | ICD-10-CM

## 2016-10-12 DIAGNOSIS — F1321 Sedative, hypnotic or anxiolytic dependence, in remission: Secondary | ICD-10-CM | POA: Diagnosis present

## 2016-10-12 DIAGNOSIS — R918 Other nonspecific abnormal finding of lung field: Secondary | ICD-10-CM | POA: Diagnosis not present

## 2016-10-12 DIAGNOSIS — J449 Chronic obstructive pulmonary disease, unspecified: Secondary | ICD-10-CM | POA: Diagnosis not present

## 2016-10-12 DIAGNOSIS — I129 Hypertensive chronic kidney disease with stage 1 through stage 4 chronic kidney disease, or unspecified chronic kidney disease: Secondary | ICD-10-CM | POA: Insufficient documentation

## 2016-10-12 DIAGNOSIS — R9431 Abnormal electrocardiogram [ECG] [EKG]: Secondary | ICD-10-CM | POA: Diagnosis not present

## 2016-10-12 DIAGNOSIS — F332 Major depressive disorder, recurrent severe without psychotic features: Secondary | ICD-10-CM | POA: Diagnosis present

## 2016-10-12 DIAGNOSIS — F17210 Nicotine dependence, cigarettes, uncomplicated: Secondary | ICD-10-CM | POA: Insufficient documentation

## 2016-10-12 DIAGNOSIS — F419 Anxiety disorder, unspecified: Secondary | ICD-10-CM | POA: Diagnosis present

## 2016-10-12 LAB — COMPREHENSIVE METABOLIC PANEL
ALBUMIN: 4.3 g/dL (ref 3.5–5.0)
ALK PHOS: 59 U/L (ref 38–126)
ALT: 25 U/L (ref 14–54)
ANION GAP: 7 (ref 5–15)
AST: 21 U/L (ref 15–41)
BUN: 11 mg/dL (ref 6–20)
CALCIUM: 9.7 mg/dL (ref 8.9–10.3)
CO2: 25 mmol/L (ref 22–32)
Chloride: 107 mmol/L (ref 101–111)
Creatinine, Ser: 0.8 mg/dL (ref 0.44–1.00)
GFR calc Af Amer: >60 mL/min (ref >60)
GFR calc non Af Amer: >60 mL/min (ref >60)
GLUCOSE: 112 mg/dL — AB (ref 65–99)
Potassium: 3.9 mmol/L (ref 3.5–5.1)
SODIUM: 139 mmol/L (ref 135–145)
Total Bilirubin: 1.5 mg/dL — ABNORMAL HIGH (ref 0.3–1.2)
Total Protein: 7.3 g/dL (ref 6.5–8.1)

## 2016-10-12 LAB — CBC WITH DIFFERENTIAL/PLATELET
BASOS PCT: 0 %
Basophils Absolute: 0 10*3/uL (ref 0.0–0.1)
EOS ABS: 0.2 10*3/uL (ref 0.0–0.7)
Eosinophils Relative: 2 %
HCT: 40.7 % (ref 36.0–46.0)
Hemoglobin: 14.1 g/dL (ref 12.0–15.0)
LYMPHS PCT: 20 %
Lymphs Abs: 1.4 10*3/uL (ref 0.7–4.0)
MCH: 31.1 pg (ref 26.0–34.0)
MCHC: 34.6 g/dL (ref 30.0–36.0)
MCV: 89.6 fL (ref 78.0–100.0)
Monocytes Absolute: 0.6 10*3/uL (ref 0.1–1.0)
Monocytes Relative: 9 %
NEUTROS ABS: 4.9 10*3/uL (ref 1.7–7.7)
Neutrophils Relative %: 69 %
PLATELETS: 250 10*3/uL (ref 150–400)
RBC: 4.54 MIL/uL (ref 3.87–5.11)
RDW: 15 % (ref 11.5–15.5)
WBC: 7.1 10*3/uL (ref 4.0–10.5)

## 2016-10-12 LAB — ETHANOL

## 2016-10-12 LAB — URINALYSIS, ROUTINE W REFLEX MICROSCOPIC
BACTERIA UA: NONE SEEN
BILIRUBIN URINE: NEGATIVE
Glucose, UA: NEGATIVE mg/dL
KETONES UR: NEGATIVE mg/dL
LEUKOCYTES UA: NEGATIVE
Nitrite: NEGATIVE
Protein, ur: NEGATIVE mg/dL
Specific Gravity, Urine: 1.02 (ref 1.005–1.030)
pH: 6 (ref 5.0–8.0)

## 2016-10-12 LAB — SALICYLATE LEVEL: Salicylate Lvl: <7 mg/dL (ref 2.8–30.0)

## 2016-10-12 LAB — RAPID URINE DRUG SCREEN, HOSP PERFORMED
Amphetamines: NOT DETECTED
Barbiturates: NOT DETECTED
Benzodiazepines: NOT DETECTED
Cocaine: NOT DETECTED
OPIATES: NOT DETECTED
Tetrahydrocannabinol: NOT DETECTED

## 2016-10-12 LAB — ACETAMINOPHEN LEVEL

## 2016-10-12 MED ORDER — HYDROXYZINE HCL 25 MG PO TABS
25.0000 mg | ORAL_TABLET | Freq: Once | ORAL | Status: AC
  Administered 2016-10-12: 25 mg via ORAL
  Filled 2016-10-12: qty 1

## 2016-10-12 MED ORDER — FLUOROMETHOLONE 0.1 % OP SUSP
1.0000 [drp] | Freq: Three times a day (TID) | OPHTHALMIC | Status: DC
  Administered 2016-10-12 – 2016-10-15 (×9): 1 [drp] via OPHTHALMIC
  Filled 2016-10-12: qty 5

## 2016-10-12 MED ORDER — GABAPENTIN 100 MG PO CAPS
100.0000 mg | ORAL_CAPSULE | Freq: Three times a day (TID) | ORAL | Status: DC | PRN
  Administered 2016-10-13 (×3): 100 mg via ORAL
  Filled 2016-10-12 (×3): qty 1

## 2016-10-12 MED ORDER — BUSPIRONE HCL 5 MG PO TABS
7.5000 mg | ORAL_TABLET | Freq: Two times a day (BID) | ORAL | Status: DC
  Administered 2016-10-12 – 2016-10-15 (×6): 7.5 mg via ORAL
  Filled 2016-10-12 (×6): qty 1.5

## 2016-10-12 MED ORDER — HYDROXYZINE HCL 25 MG PO TABS
25.0000 mg | ORAL_TABLET | Freq: Three times a day (TID) | ORAL | Status: DC | PRN
  Administered 2016-10-12 – 2016-10-14 (×5): 25 mg via ORAL
  Filled 2016-10-12 (×6): qty 1

## 2016-10-12 MED ORDER — SULINDAC 150 MG PO TABS: 150.0000 mg | ORAL_TABLET | Freq: Every day | ORAL | Status: DC | PRN

## 2016-10-12 MED ORDER — ALBUTEROL SULFATE HFA 108 (90 BASE) MCG/ACT IN AERS
2.0000 | INHALATION_SPRAY | RESPIRATORY_TRACT | Status: DC | PRN
  Administered 2016-10-13 – 2016-10-15 (×3): 2 via RESPIRATORY_TRACT
  Filled 2016-10-12: qty 6.7

## 2016-10-12 MED ORDER — SACCHAROMYCES BOULARDII 250 MG PO CAPS
250.0000 mg | ORAL_CAPSULE | Freq: Two times a day (BID) | ORAL | Status: DC
  Administered 2016-10-12 – 2016-10-15 (×6): 250 mg via ORAL
  Filled 2016-10-12 (×7): qty 1

## 2016-10-12 MED ORDER — SULFASALAZINE 500 MG PO TABS: 500.0000 mg | ORAL_TABLET | Freq: Every day | ORAL | Status: DC | PRN

## 2016-10-12 MED ORDER — LORAZEPAM 1 MG PO TABS
1.0000 mg | ORAL_TABLET | Freq: Once | ORAL | Status: AC
  Administered 2016-10-12: 1 mg via ORAL
  Filled 2016-10-12: qty 1

## 2016-10-12 MED ORDER — ONDANSETRON 4 MG PO TBDP
4.0000 mg | ORAL_TABLET | Freq: Three times a day (TID) | ORAL | Status: DC | PRN
  Administered 2016-10-12 – 2016-10-15 (×6): 4 mg via ORAL
  Filled 2016-10-12 (×7): qty 1

## 2016-10-12 MED ORDER — ONDANSETRON 4 MG PO TBDP
4.0000 mg | ORAL_TABLET | Freq: Once | ORAL | Status: AC
  Administered 2016-10-12: 4 mg via ORAL
  Filled 2016-10-12: qty 1

## 2016-10-12 NOTE — ED Notes (Signed)
Pt c/o anxiety. This nurse notified EDP.

## 2016-10-12 NOTE — ED Notes (Signed)
Pt admitted to room #39. Pt presents with sad affect. Pt denies SI/HI/AVH. Pt reports that her mother is dying and that she fears not having a place to live . Encouragement and support provided. Special checks q 15 mins in place for safety. Video monitoring in place. Will continue to monitor.

## 2016-10-12 NOTE — [facility] Counselor (Signed)
Writer notified EDP [place] of pt's disposition recommendation. Shuvon Rankin NP recommends pt be discharged to follow up with Dr Casimiro Needle, psychiatrist.  Arnold Long, [place] Therapeutic Triage Specialist

## 2016-10-12 NOTE — ED Notes (Signed)
Bed: WLPT4 Expected date:  Expected time:  Means of arrival:  Comments: 

## 2016-10-12 NOTE — BH Assessment (Addendum)
Tele Assessment Note  Pt presents voluntarily to [facility] her sister Kirsten Harper [phone]. Writer conducts [employer]. Pt is cooperative and oriented x 4. She reports her psychiatrist continues to be Dr [name], and she reports she had an appt with him last week. Pt says she didn't refill psych meds he prescribed - she said she took Seroquel in past and it "didn't agree with me" and she says the Remeron wasn't helping much. She endorses extreme anxiety. She says, "The worry has a life of its own".  She sts she voluntarily admitted herself to [facility] in late Feb 2018 in order to detox and stay off of klonopin. Pt sts last benzodiazepine use was March 3rd while in hospital. She sts she feels like she needs to be on ativan or something similar long term. She sts she hadn't had an appetite, but then she took an ativan while she was in the ED. Pt reports she was then able to eat breakfast. Pt endorses severe anxiety and depressive symptoms including insomnia, unintended weight loss (20 lbs), anhedonia, lability and hopelessness. .Pt denies SI currently or at any time in the past. Pt denies any history of suicide attempts and denies history of self-mutilation.Pt says, "I'm all alone in the world." Pt reports she doesn't want to bother her 34 yo mom who is in poor health. She reports her mother's health has been failing over the past month and pt is obsessing about not having a place to live. She says, "I'm fixated on a place to live." She sts it would have to be a subsidized apartment. She reports no social support system. Pt reports sister only somewhat supportive.  Pt says that she isn't leaving her house. She reports impairment in concentration and remote and recent memory. Pt reports hx of substance abuse and inpatient treatment in 1992 and 1996 in [place]. Pt reports estranged from her 2 sons. Pt reports becoming fearful of things like winter and snow. Pt reports last xanax use last month. PT reports  needing something to calm her nerves but she knows she can't take most meds b/c of addiction history. Kirsten Harper states she knows she made a mistake when she was taking clonazepam with wine. She reports last drank etoh approx two months ago. Pt denies homicidal thoughts or physical aggression. Pt denies having any legal problems at this time. Pt is calm and cooperative during assessment. Pt denies hallucinations. Pt does not appear to be responding to internal stimuli and exhibits no delusional thought. Pt's reality testing appears to be intact.  Kirsten Harper is an 71 y.o. female.   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 Ankylosing spondylitis ([place])   . Anxiety   . Arthritis   . Chronic lower back pain   . CKD (chronic kidney disease), stage II    "related to the Ankylosing spondylitis" (04/10/2016)  . COPD (chronic obstructive pulmonary disease) ([place])   . Eye disease   . Hypertension   . PONV (postoperative nausea and vomiting)    "didn't bother me the last time I had it in ~ 2015"    Past Surgical History:  Procedure Laterality Date  . BROW LIFT AND BLEPHAROPLASTY Bilateral ~ 2015  . CATARACT EXTRACTION W/ INTRAOCULAR LENS  IMPLANT, BILATERAL Bilateral 2000s  . MANDIBLE SURGERY  ~ 2000   "  jaw had moved to left; broke my jaw in 5 places; put titanium in; stripped muscle from bone; wired jaw shut"  . TUBAL LIGATION  1979  . VAGINAL HYSTERECTOMY  1985    Family History:  Family History  Problem Relation Age of Onset  . Hypertension Mother   . Heart disease Sister   . Diabetes Sister   . Pulmonary fibrosis Brother   . Alzheimer's disease Brother     Social History:  reports that she has been smoking Cigarettes.  She has a 25.50 pack-year smoking history. She has  never used smokeless tobacco. She reports that she drinks about 0.6 oz of alcohol per week . She reports that she does not use drugs.  Additional Social History:  Alcohol / Drug Use Pain Medications: pt denies abuse - see pta meds list Prescriptions: pt reports hx of abusing klonopin Over the Counter: pt denies - see pta meds list History of alcohol / drug use?: Yes Longest period of sobriety (when/how long): unknown Substance #1 Name of Substance 1: klonopin and valium 1 - Age of First Use: unknown 1 - Frequency: daily 1 - Duration: off and on 36 yrs 1 - Last Use / Amount: mid March 2018 Substance #2 Name of Substance 2: etoh 2 - Age of First Use: teenager  2 - Last Use / Amount: two months ago   CIWA: CIWA-Ar BP: (!) 112/56 Pulse Rate: 86 COWS:    PATIENT STRENGTHS: (choose at least two) Average or above average intelligence Capable of independent living Communication skills General fund of knowledge  Allergies:  Allergies  Allergen Reactions  . Amoxapine And Related     Patient reports it did not work for her.     Home Medications:  (Not in a hospital admission)  OB/GYN Status:  No LMP recorded. Patient has had a hysterectomy.  General Assessment Data Location of Assessment: [facility] TTS Assessment: In system Is this a Tele or Face-to-Face Assessment?: Tele Assessment Is this an Initial Assessment or a Re-assessment for this encounter?: Initial Assessment Marital status: Divorced [place] name: Kirsten Harper Is patient pregnant?: No Pregnancy Status: No Living Arrangements:  (lives in garage apt behind mom and stepdad) Can pt return to current living arrangement?: Yes Admission Status: Voluntary Is patient capable of signing voluntary admission?: Yes Referral Source: Self/Family/Friend Insurance type: medicare     Crisis Care Plan Living Arrangements:  (lives in garage apt behind mom and stepdad) Name of Psychiatrist: [name] Name of Therapist:  none  Education Status Is patient currently in school?: No Highest grade of school patient has completed: 1 Name of school: college grad  Risk to self with the past 6 months Suicidal Ideation: No Has patient been a risk to self within the past 6 months prior to admission? : No Suicidal Intent: No Has patient had any suicidal intent within the past 6 months prior to admission? : No Is patient at risk for suicide?: No Suicidal Plan?: No Has patient had any suicidal plan within the past 6 months prior to admission? : No Access to Means: No What has been your use of drugs/alcohol within the last 12 months?: pt sts last drank wine two mos ago Previous Attempts/Gestures: No How many times?: 0 Other Self Harm Risks: none Triggers for Past Attempts:  (n/a) Intentional Self Injurious Behavior: None Family Suicide History: Unknown (brother may have killed himself by drug overdose) Recent stressful life event(s): Recent negative physical changes, Financial Problems, Other (Comment) (mom's failing health)  Persecutory voices/beliefs?: No Depression: Yes Depression Symptoms: Insomnia, Fatigue, Isolating, Loss of interest in usual pleasures, Feeling worthless/self pity, Despondent Substance abuse history and/or treatment for substance abuse?: No Suicide prevention information given to non-admitted patients: Not applicable  Risk to Others within the past 6 months Homicidal Ideation: No Does patient have any lifetime risk of violence toward others beyond the six months prior to admission? : No Thoughts of Harm to Others: No Current Homicidal Intent: No Current Homicidal Plan: No Access to Homicidal Means: No Identified Victim: none History of harm to others?: No Assessment of Violence: None Noted Violent Behavior Description: pt cooperative Does patient have access to weapons?: Yes (Comment) (pt has handgun for self defense) Criminal Charges Pending?: No Does patient have a court date:  No Is patient on probation?: No  Psychosis Hallucinations: None noted Delusions: None noted  Mental Status Report Appearance/Hygiene: Unremarkable, In scrubs Eye Contact: Good Motor Activity: Restlessness Speech: Logical/coherent (talkative) Level of Consciousness: Alert Mood: Depressed, Anxious, Sad Affect: Anxious Anxiety Level: Severe Thought Processes: Relevant, Coherent Judgement: Unimpaired Orientation: Person, Place, Situation, Time Obsessive Compulsive Thoughts/Behaviors: Moderate (obsessive thoughts re: housing)  Cognitive Functioning Concentration: Decreased Memory: Remote Impaired, Recent Impaired IQ: Average Insight: Fair Impulse Control: Fair Appetite: Poor Weight Loss:  (20 lbs lost since Oct 2017) Sleep: Decreased Total Hours of Sleep: 1 Vegetative Symptoms:  (staying in her apartment)  ADLScreening ([place]) Patient's cognitive ability adequate to safely complete daily activities?: Yes Patient able to express need for assistance with ADLs?: Yes Independently performs ADLs?: Yes (appropriate for developmental age)  Prior Inpatient Therapy Prior Inpatient Therapy: Yes Prior Therapy Dates: Feb 2018, 1996 & 1992 Prior Therapy Facilty/Provider(s): [facility] [place] Reason for Treatment: to get off klonopin, substance abuse  Prior Outpatient Therapy Prior Outpatient Therapy: Yes Prior Therapy Dates: currently' Prior Therapy Facilty/Provider(s): Dr Casimiro Needle Reason for Treatment: med management for anxiety and MDD Does patient have an ACCT team?: No Does patient have Intensive In-House Services?  : No Does patient have [facility] services? : No Does patient have P4CC services?: Unknown  ADL Screening (condition at time of admission) Patient's cognitive ability adequate to safely complete daily activities?: Yes Is the patient deaf or have difficulty hearing?: No Does the patient have difficulty seeing, even when  wearing glasses/contacts?: No Does the patient have difficulty concentrating, remembering, or making decisions?: No Patient able to express need for assistance with ADLs?: Yes Does the patient have difficulty dressing or bathing?: No Independently performs ADLs?: Yes (appropriate for developmental age) Does the patient have difficulty walking or climbing stairs?: No Weakness of Legs: None Weakness of Arms/Hands: None  Home Assistive Devices/Equipment Home Assistive Devices/Equipment: None    Abuse/Neglect Assessment (Assessment to be complete while patient is alone) Physical Abuse: Denies Verbal Abuse: Denies Sexual Abuse: Denies Exploitation of patient/patient's resources: Denies Self-Neglect: Denies     Regulatory affairs officer (For Healthcare) Does Patient Have a Medical Advance Directive?: No Does patient want to make changes to medical advance directive?: No - Patient declined Type of Advance Directive:  (pt denies 10/12/16 but on 08/26/16 reported having living will) Would patient like information on creating a medical advance directive?: No - Patient declined    Additional Information 1:1 In Past 12 Months?: No CIRT Risk: No Elopement Risk: No Does patient have medical clearance?: Yes     Disposition:  Disposition Initial Assessment Completed for this Encounter: Yes Disposition of Patient: Other dispositions Other disposition(s): To current provider ([facility]  rankin np recommends d/c & follow up [facility])  Writer ran pt by Earleen Newport NP. [facility]in NP reports that pt doesn't meet inpatient criteria as pt is not suicidal, homicidal or psychotic. Suicide risk is low because no prior attempts. Supportive factor is pt's therapeutic relationship w/ [facility].  Macario Shear P 10/12/2016 12:05 PM

## 2016-10-12 NOTE — ED Provider Notes (Signed)
[facility] DEPT Provider Note   CSN: [id number] Arrival date & time: 10/12/16  0757     History   Chief Complaint Chief Complaint  Patient presents with  . Anxiety    HPI Kirsten Harper is a 71 y.o. female.  The history is provided by the patient and a relative. No language interpreter was used.  Anxiety     Kirsten Harper is a 71 y.o. female who presents to the Emergency Department complaining of anxiety. She endorses feeling increased anxiety and stress lately. She is currently living with her mother but her mother is in failing health and she is concerned that she is going to be homeless soon. Her sister also provide history. Her sister reports that the patient is not sleeping and is exceedingly anxious. The patient endorses feelings of not wanting to live like this anymore and history of suicidal ideation in the last month. A month ago she was recently admitted and treated at [facility] for [place] abuse and was taken off of her Klonopin. She saw her psychiatrist earlier this week for her anxiety and she was provided prescriptions for medications but she did not take them due to concerns for side effects. She states she has tried Seroquel and trazodone in the past but they make her feel worse. She stopped taking her BuSpar several days ago due to worsening anxiety and restless legs. She denies any fevers, chest pain, shortness of breath, vomiting, diarrhea. She states that she sometimes hears crickets in her ears but does not hear any voices or have any visual hallucinations. She states there is no way she can go home like this.  Past Medical History:  Diagnosis Date  . Ankylosing spondylitis ([place])   . Anxiety   . Arthritis   . Chronic lower back pain   . CKD (chronic kidney disease), stage II    "related to the Ankylosing spondylitis" (04/10/2016)  . COPD (chronic obstructive pulmonary disease) ([place])   . Eye disease   . Hypertension   . PONV (postoperative nausea and  vomiting)    "didn't bother me the last time I had it in ~ 2015"    Patient Active Problem List   Diagnosis Date Noted  . Community acquired pneumonia of right lower lobe of lung ([place]) 09/24/2016  . Ankylosing spondylitis of lumbosacral region ([place]) 09/24/2016  . Major depressive disorder, recurrent severe without psychotic features ([place]) 08/27/2016  . GAD (generalized anxiety disorder) 08/27/2016  . 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 Spondyloarthropathy HLA-B27 positive 06/06/2016  . Iritis 06/06/2016  . High risk medication use 06/06/2016  . Primary osteoarthritis of both hands 06/06/2016  . Smoker 06/06/2016  . Acute respiratory failure with hypoxia ([place]) 04/10/2016  . COPD exacerbation ([place]) 04/10/2016  . Anxiety 04/10/2016  . Essential hypertension     Past Surgical History:  Procedure Laterality Date  . BROW LIFT AND BLEPHAROPLASTY Bilateral ~ 2015  . CATARACT EXTRACTION W/ INTRAOCULAR LENS  IMPLANT, BILATERAL Bilateral 2000s  . MANDIBLE SURGERY  ~ 2000   "jaw had moved to left; broke my jaw in 5 places; put titanium in; stripped muscle from bone; wired jaw shut"  . TUBAL LIGATION  1979  . VAGINAL HYSTERECTOMY  1985    OB History    No data available       Home Medications    Prior to Admission medications   Medication Sig Start Date End Date Taking? Authorizing Provider  albuterol (PROVENTIL HFA;VENTOLIN HFA)  108 (90 Base) MCG/ACT inhaler Inhale 2 puffs into the lungs every 4 (four) hours as needed for wheezing or shortness of breath (cough, shortness of breath or wheezing.). 04/13/16   Kelvin Cellar, MD  busPIRone (BUSPAR) 7.5 MG tablet Take 1 tablet (7.5 mg total) by mouth 2 (two) times daily. 09/26/16   Lavina Hamman, MD  fluorometholone (FML) 0.1 % ophthalmic suspension Place 1 drop into both eyes 3 (three) times daily. 08/13/16   Historical Provider, MD  hydrOXYzine (ATARAX/VISTARIL) 25 MG tablet Take 1 tablet (25 mg total) by mouth  3 (three) times daily as needed for anxiety. 09/26/16   Lavina Hamman, MD  methotrexate (RHEUMATREX) 2.5 MG tablet Take 7.5 mg by mouth every 7 (seven) days.  07/16/16   Historical Provider, MD  mirtazapine (REMERON) 45 MG tablet Take 45 mg by mouth at bedtime. 08/19/16   Historical Provider, MD  saccharomyces boulardii (FLORASTOR) 250 MG capsule Take 1 capsule (250 mg total) by mouth 2 (two) times daily. 09/26/16   Lavina Hamman, MD  sulfaSALAzine (AZULFIDINE) 500 MG tablet Take 500 mg by mouth daily as needed (arthritis).    Historical Provider, MD  sulindac (CLINORIL) 150 MG tablet Take 150 mg by mouth daily as needed (arthiritis).     Historical Provider, MD    Family History Family History  Problem Relation Age of Onset  . Hypertension Mother   . Heart disease Sister   . Diabetes Sister   . Pulmonary fibrosis Brother   . Alzheimer's disease Brother     Social History Social History  Substance Use Topics  . Smoking status: Current Every Day Smoker    Packs/day: 0.50    Years: 51.00    Types: Cigarettes  . Smokeless tobacco: Never Used  . Alcohol use 0.6 oz/week    1 Glasses of wine per week     Allergies   Amoxapine and related   Review of Systems Review of Systems  All other systems reviewed and are negative.    Physical Exam Updated Vital Signs BP (!) 149/89 (BP Location: Left Arm)   Pulse 71   Temp 97.7 F (36.5 C) (Oral)   Resp 18   SpO2 97%   Physical Exam  Constitutional: She is oriented to person, place, and time. She appears well-developed and well-nourished.  HENT:  Head: Normocephalic and atraumatic.  Cardiovascular: Normal rate and regular rhythm.   No murmur heard. Pulmonary/Chest: Effort normal and breath sounds normal. No respiratory distress.  Abdominal: Soft. There is no tenderness. There is no rebound and no guarding.  Musculoskeletal: She exhibits no edema or tenderness.  Neurological: She is alert and oriented to person, place, and time.    Skin: Skin is warm and dry.  Psychiatric:  Anxious and hyperactive. Pressured speech.  Nursing note and vitals reviewed.    ED Treatments / Results  Labs (all labs ordered are listed, but only abnormal results are displayed) Labs Reviewed  URINALYSIS, ROUTINE W REFLEX MICROSCOPIC  RAPID URINE DRUG SCREEN, HOSP PERFORMED  COMPREHENSIVE METABOLIC PANEL  CBC WITH DIFFERENTIAL/PLATELET  ETHANOL  ACETAMINOPHEN LEVEL  SALICYLATE LEVEL    EKG  EKG Interpretation None       Radiology No results found.  Procedures Procedures (including critical care time)  Medications Ordered in ED Medications  LORazepam (ATIVAN) tablet 1 mg (not administered)     Initial Impression / Assessment and Plan / ED Course  I have reviewed the triage vital signs and the nursing notes.  Pertinent labs & imaging results that were available during my care of the patient were reviewed by me and considered in my medical decision making (see chart for details).     Patient with history of anxiety and Klonopin abuse here for increased anxiety and stress at home. She does have passive and side. She is anxious on examination with hyperactivity and pressured speech. She has been medically cleared for psychiatric evaluation and treatment.  Patient initially assessed by psychiatry team and plan was for outpatient follow-up with her psychiatrist. On repeat assessment in the emergency department patient endorses suicidal thoughts with a desire to not live. She states that she is too anxious to go home and has nothing to live for. She states she would rather be dead than be blind. Psychiatry reconsulted for further recommendations.  Final Clinical Impressions(s) / ED Diagnoses   Final diagnoses:  None    New Prescriptions New Prescriptions   No medications on file     Quintella Reichert, MD 10/12/16 [id number]

## 2016-10-12 NOTE — ED Triage Notes (Signed)
She reports excalating anxiety and depression and is becoming fixated on "finding a place to live--I live with my mother and she needs me to take care of her". She denies s.I./h.I., but states later in the interview "I feel like I just can't keep going on". Her sister is with her. She further tells me that sh was formerly on Klonipin "but I went to [facility] to get off that in February".

## 2016-10-12 NOTE — BHH Counselor (Addendum)
TC from pt's RN Maudie Mercury who reports pt unable to contract for safety. She reports pt requesting ativan. Writer spoke w/ EDP Ralene Bathe. Pt will stay overnight and have psychiatry eval in am.  Arnold Long, [facility] Therapeutic Triage Specialist

## 2016-10-13 ENCOUNTER — Encounter (HOSPITAL_COMMUNITY): Payer: Self-pay | Admitting: Registered Nurse

## 2016-10-13 ENCOUNTER — Emergency Department (HOSPITAL_COMMUNITY): Payer: Medicare Other

## 2016-10-13 DIAGNOSIS — R918 Other nonspecific abnormal finding of lung field: Secondary | ICD-10-CM | POA: Diagnosis not present

## 2016-10-13 DIAGNOSIS — F17210 Nicotine dependence, cigarettes, uncomplicated: Secondary | ICD-10-CM

## 2016-10-13 DIAGNOSIS — F1321 Sedative, hypnotic or anxiolytic dependence, in remission: Secondary | ICD-10-CM | POA: Diagnosis present

## 2016-10-13 DIAGNOSIS — F411 Generalized anxiety disorder: Secondary | ICD-10-CM | POA: Diagnosis not present

## 2016-10-13 DIAGNOSIS — Z79899 Other long term (current) drug therapy: Secondary | ICD-10-CM | POA: Diagnosis not present

## 2016-10-13 DIAGNOSIS — F332 Major depressive disorder, recurrent severe without psychotic features: Secondary | ICD-10-CM

## 2016-10-13 DIAGNOSIS — Z810 Family history of intellectual disabilities: Secondary | ICD-10-CM | POA: Diagnosis not present

## 2016-10-13 MED ORDER — MIRTAZAPINE 30 MG PO TABS
45.0000 mg | ORAL_TABLET | Freq: Every day | ORAL | Status: DC
  Administered 2016-10-13 – 2016-10-14 (×2): 45 mg via ORAL
  Filled 2016-10-13 (×2): qty 2

## 2016-10-13 NOTE — ED Notes (Signed)
C/O anxiety.  PRN medication requested and received.

## 2016-10-13 NOTE — Consult Note (Signed)
[facility] Face-to-Face Psychiatry Consult   Reason for Consult:  Worsening depression and anxiety Referring Physician:  EDP Patient Identification: Kirsten Harper MRN:  [id number] Principal Diagnosis: Major depressive disorder, recurrent severe without psychotic features [facility]) Diagnosis:   Patient Active Problem List   Diagnosis Date Noted  . Benzodiazepine dependence in remission ([place]) [F13.21] 10/13/2016  . Community acquired pneumonia of right lower lobe of lung ([place]) [J18.1] 09/24/2016  . Ankylosing spondylitis of lumbosacral region ([place]) [M45.7] 09/24/2016  . Major depressive disorder, recurrent severe without psychotic features ([place]) [F33.2] 08/27/2016  . GAD (generalized anxiety disorder) [F41.1] 08/27/2016  . Sedative hypnotic or anxiolytic dependence ([place]) [F13.20] 08/27/2016  . 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 Iritis [H20.9] 06/06/2016  . High risk medication use [Z79.899] 06/06/2016  . Primary osteoarthritis of both hands [M19.041, M19.042] 06/06/2016  . Smoker [F17.200] 06/06/2016  . Acute respiratory failure with hypoxia ([place]) [J96.01] 04/10/2016  . COPD exacerbation ([place]) [J44.1] 04/10/2016  . Anxiety [F41.9] 04/10/2016  . Essential hypertension [I10]     Total Time spent with patient: 45 minutes  Subjective:   Kirsten Harper is a 71 y.o. female patients to [facility] with worsening depression and anxiety.  HPI:  Kirsten Harper 71 y.o. The potential side effects of this medication have been discussed with the patient.  Call if any significant problems with these are experienced. patient seen by Dr. Adele Schilder and this provider.  Chart reviewed 10/13/16.   On evaluation:  Kirsten Harper reports that she has worsening depression and anxiety related to chronic illnesses.  State she see Dr. Judithann Graves out patent services but the medication he is prescribing is not working.  Patient was recently detox/taper off Benzodiazepines but states that they are the only medications  that helps with her anxiety.  Patient is unable to get the Benzo's from her psychiatrist or her primary care physician.  Informed patient that there would be not prescribing any benzodiazapine while in the hospital.  Patient voiced understanding.  States "I brought a gun 2 years ago at first I put it in my trunk to be easy to get to;  but later I gave it to my mother to put away."  Patient states that she sits and thinks of ways to kill her self "taking pills, hanging, or gun.  I feel like I've had a nervous break down.  I can't drive, I can't sleep.  Patient states that she recently saw Dr. Judithann Graves last week but wasn't honest with him about how she has been feeling and that she doesn't feel that the medication he is prescribing is working.  At this time patient is passive suicidal making references to the different ways she has thought to kill her self and having access to those items or ways.  Patient also seeking prescription for Benzodiazepine which she has been informed that we will not prescribe.  Patient denies homicidal ideation, psychosis, and paranoia.     Past Psychiatric History: Benzodiazepine dependence, General anxiety, Major depression  Risk to Self: Suicidal Ideation: No Suicidal Intent: No Is patient at risk for suicide?: No Suicidal Plan?: No Access to Means: No What has been your use of drugs/alcohol within the last 12 months?: pt sts last drank wine two mos ago How many times?: 0 Other Self Harm Risks: none Triggers for Past Attempts:  (n/a) Intentional Self Injurious Behavior: None Risk to Others: Homicidal Ideation: No Thoughts of Harm to Others: No Current Homicidal Intent: No Current Homicidal Plan: No  Access to Homicidal Means: No Identified Victim: none History of harm to others?: No Assessment of Violence: None Noted Violent Behavior Description: pt cooperative Does patient have access to weapons?: Yes (Comment) (pt has handgun for self defense) Criminal Charges  Pending?: No Does patient have a court date: No Prior Inpatient Therapy: Prior Inpatient Therapy: Yes Prior Therapy Dates: Feb 2018, 1996 & 1992 Prior Therapy Facilty/Provider(s): [facility] [place] in [place] Reason for Treatment: to get off klonopin, substance abuse Prior Outpatient Therapy: Prior Outpatient Therapy: Yes Prior Therapy Dates: currently' Prior Therapy Facilty/Provider(s): Dr Casimiro Needle Reason for Treatment: med management for anxiety and MDD Does patient have an ACCT team?: No Does patient have Intensive In-House Services?  : No Does patient have [facility] services? : No Does patient have P4CC services?: Unknown  Past Medical History:  Past Medical History:  Diagnosis Date  . Ankylosing spondylitis ([place])   . Anxiety   . Arthritis   . Chronic lower back pain   . CKD (chronic kidney disease), stage II    "related to the Ankylosing spondylitis" (04/10/2016)  . COPD (chronic obstructive pulmonary disease) ([place])   . Eye disease   . Hypertension   . PONV (postoperative nausea and vomiting)    "didn't bother me the last time I had it in ~ 2015"    Past Surgical History:  Procedure Laterality Date  . BROW LIFT AND BLEPHAROPLASTY Bilateral ~ 2015  . CATARACT EXTRACTION W/ INTRAOCULAR LENS  IMPLANT, BILATERAL Bilateral 2000s  . MANDIBLE SURGERY  ~ 2000   "jaw had moved to left; broke my jaw in 5 places; put titanium in; stripped muscle from bone; wired jaw shut"  . TUBAL LIGATION  1979  . VAGINAL HYSTERECTOMY  1985   Family History:  Family History  Problem Relation Age of Onset  . Hypertension Mother   . Heart disease Sister   . Diabetes Sister   . Pulmonary fibrosis Brother   . Alzheimer's disease Brother    Family Psychiatric  History: Denies  History  Alcohol Use  . 0.6 oz/week  . 1 Glasses of wine per week     History  Drug Use No    Social History   Social History  . Marital status: Divorced    Spouse name: N/A  . Number of children:  N/A  . Years of education: N/A   Social History Main Topics  . Smoking status: Current Every Day Smoker    Packs/day: 0.50    Years: 51.00    Types: Cigarettes  . Smokeless tobacco: Never Used  . Alcohol use 0.6 oz/week    1 Glasses of wine per week  . Drug use: No  . Sexual activity: No   Other Topics Concern  . None   Social History Narrative  . None   Additional Social History:    Allergies:   Allergies  Allergen Reactions  . Amoxapine And Related     Patient reports it did not work for her.     Labs:  Results for orders placed or performed during the hospital encounter of 10/12/16 (from the past 48 hour(s))  Urinalysis, Routine w reflex microscopic     Status: Abnormal   Collection Time: 10/12/16  8:52 AM  Result Value Ref Range   Color, Urine YELLOW YELLOW   APPearance CLEAR CLEAR   Specific Gravity, Urine 1.020 1.005 - 1.030   pH 6.0 5.0 - 8.0   Glucose, UA NEGATIVE NEGATIVE mg/dL  Hgb urine dipstick SMALL (A) NEGATIVE   Bilirubin Urine NEGATIVE NEGATIVE   Ketones, ur NEGATIVE NEGATIVE mg/dL   Protein, ur NEGATIVE NEGATIVE mg/dL   Nitrite NEGATIVE NEGATIVE   Leukocytes, UA NEGATIVE NEGATIVE   RBC / HPF 0-5 0 - 5 RBC/hpf   WBC, UA 0-5 0 - 5 WBC/hpf   Bacteria, UA NONE SEEN NONE SEEN   Squamous Epithelial / LPF 0-5 (A) NONE SEEN   Mucous PRESENT   Urine rapid drug screen (hosp performed)     Status: None   Collection Time: 10/12/16  8:52 AM  Result Value Ref Range   Opiates NONE DETECTED NONE DETECTED   Cocaine NONE DETECTED NONE DETECTED   Benzodiazepines NONE DETECTED NONE DETECTED   Amphetamines NONE DETECTED NONE DETECTED   Tetrahydrocannabinol NONE DETECTED NONE DETECTED   Barbiturates NONE DETECTED NONE DETECTED    Comment:        DRUG SCREEN FOR MEDICAL PURPOSES ONLY.  IF CONFIRMATION IS NEEDED FOR ANY PURPOSE, NOTIFY LAB WITHIN 5 DAYS.        LOWEST DETECTABLE LIMITS FOR URINE DRUG SCREEN Drug Class       Cutoff (ng/mL) Amphetamine       1000 Barbiturate      200 Benzodiazepine   361 Tricyclics       443 Opiates          300 Cocaine          300 THC              50   Comprehensive metabolic panel     Status: Abnormal   Collection Time: 10/12/16  8:52 AM  Result Value Ref Range   Sodium 139 135 - 145 mmol/L   Potassium 3.9 3.5 - 5.1 mmol/L   Chloride 107 101 - 111 mmol/L   CO2 25 22 - 32 mmol/L   Glucose, Bld 112 (H) 65 - 99 mg/dL   BUN 11 6 - 20 mg/dL   Creatinine, Ser 0.80 0.44 - 1.00 mg/dL   Calcium 9.7 8.9 - 10.3 mg/dL   Total Protein 7.3 6.5 - 8.1 g/dL   Albumin 4.3 3.5 - 5.0 g/dL   AST 21 15 - 41 U/L   ALT 25 14 - 54 U/L   Alkaline Phosphatase 59 38 - 126 U/L   Total Bilirubin 1.5 (H) 0.3 - 1.2 mg/dL   GFR calc non Af Amer >60 >60 mL/min   GFR calc Af Amer >60 >60 mL/min    Comment: (NOTE) The eGFR has been calculated using the CKD EPI equation. This calculation has not been validated in all clinical situations. eGFR's persistently <60 mL/min signify possible Chronic Kidney Disease.    Anion gap 7 5 - 15  CBC with Differential     Status: None   Collection Time: 10/12/16  8:52 AM  Result Value Ref Range   WBC 7.1 4.0 - 10.5 K/uL   RBC 4.54 3.87 - 5.11 MIL/uL   Hemoglobin 14.1 12.0 - 15.0 g/dL   HCT 40.7 36.0 - 46.0 %   MCV 89.6 78.0 - 100.0 fL   MCH 31.1 26.0 - 34.0 pg   MCHC 34.6 30.0 - 36.0 g/dL   RDW 15.0 11.5 - 15.5 %   Platelets 250 150 - 400 K/uL   Neutrophils Relative % 69 %   Neutro Abs 4.9 1.7 - 7.7 K/uL   Lymphocytes Relative 20 %   Lymphs Abs 1.4 0.7 - 4.0 K/uL   Monocytes Relative  9 %   Monocytes Absolute 0.6 0.1 - 1.0 K/uL   Eosinophils Relative 2 %   Eosinophils Absolute 0.2 0.0 - 0.7 K/uL   Basophils Relative 0 %   Basophils Absolute 0.0 0.0 - 0.1 K/uL  Ethanol     Status: None   Collection Time: 10/12/16  8:52 AM  Result Value Ref Range   Alcohol, Ethyl (B) <5 <5 mg/dL    Comment:        LOWEST DETECTABLE LIMIT FOR SERUM ALCOHOL IS 5 mg/dL FOR MEDICAL PURPOSES  ONLY   Acetaminophen level     Status: Abnormal   Collection Time: 10/12/16  8:52 AM  Result Value Ref Range   Acetaminophen (Tylenol), Serum <10 (L) 10 - 30 ug/mL    Comment:        THERAPEUTIC CONCENTRATIONS VARY SIGNIFICANTLY. A RANGE OF 10-30 ug/mL MAY BE AN EFFECTIVE CONCENTRATION FOR MANY PATIENTS. HOWEVER, SOME ARE BEST TREATED AT CONCENTRATIONS OUTSIDE THIS RANGE. ACETAMINOPHEN CONCENTRATIONS >150 ug/mL AT 4 HOURS AFTER INGESTION AND >50 ug/mL AT 12 HOURS AFTER INGESTION ARE OFTEN ASSOCIATED WITH TOXIC REACTIONS.   Salicylate level     Status: None   Collection Time: 10/12/16  8:52 AM  Result Value Ref Range   Salicylate Lvl <6.0 2.8 - 30.0 mg/dL    Current Facility-Administered Medications  Medication Dose Route Frequency Provider Last Rate Last Dose  . albuterol (PROVENTIL HFA;VENTOLIN HFA) 108 (90 Base) MCG/ACT inhaler 2 puff  2 puff Inhalation Q4H PRN Quintella Reichert, MD   2 puff at 10/13/16 0116  . busPIRone (BUSPAR) tablet 7.5 mg  7.5 mg Oral BID Quintella Reichert, MD   7.5 mg at 10/13/16 1039  . fluorometholone (FML) 0.1 % ophthalmic suspension 1 drop  1 drop Both Eyes TID Quintella Reichert, MD   1 drop at 10/13/16 0802  . gabapentin (NEURONTIN) capsule 100 mg  100 mg Oral TID PRN Quintella Reichert, MD   100 mg at 10/13/16 0802  . hydrOXYzine (ATARAX/VISTARIL) tablet 25 mg  25 mg Oral TID PRN Quintella Reichert, MD   25 mg at 10/13/16 0802  . ondansetron (ZOFRAN-ODT) disintegrating tablet 4 mg  4 mg Oral Q8H PRN Virgel Manifold, MD   4 mg at 10/13/16 1040  . saccharomyces boulardii (FLORASTOR) capsule 250 mg  250 mg Oral BID Quintella Reichert, MD   250 mg at 10/13/16 1039   Current Outpatient Prescriptions  Medication Sig Dispense Refill  . albuterol (PROVENTIL HFA;VENTOLIN HFA) 108 (90 Base) MCG/ACT inhaler Inhale 2 puffs into the lungs every 4 (four) hours as needed for wheezing or shortness of breath (cough, shortness of breath or wheezing.). 1 Inhaler 1  . busPIRone (BUSPAR)  7.5 MG tablet Take 1 tablet (7.5 mg total) by mouth 2 (two) times daily. 20 tablet 0  . clonazePAM (KLONOPIN) 1 MG tablet Take 1 mg by mouth daily as needed for anxiety.     . fluorometholone (FML) 0.1 % ophthalmic suspension Place 1 drop into both eyes 3 (three) times daily.  5  . mirtazapine (REMERON) 45 MG tablet Take 45 mg by mouth at bedtime.    . ondansetron (ZOFRAN) 8 MG tablet Take 8 mg by mouth every 8 (eight) hours as needed for nausea.     Marland Kitchen saccharomyces boulardii (FLORASTOR) 250 MG capsule Take 1 capsule (250 mg total) by mouth 2 (two) times daily. 10 capsule 0  . SYMBICORT 160-4.5 MCG/ACT inhaler Inhale 2 puffs into the lungs daily.  Musculoskeletal: Strength & Muscle Tone: within normal limits Gait & Station: normal Patient leans: N/A  Psychiatric Specialty Exam: Physical Exam  Nursing note and vitals reviewed. Constitutional: She is oriented to person, place, and time.  Neck: Normal range of motion.  Respiratory: Effort normal.  Musculoskeletal: Normal range of motion.  Neurological: She is alert and oriented to person, place, and time.    Review of Systems  Psychiatric/Behavioral: Positive for depression and suicidal ideas. Negative for hallucinations and substance abuse (Denies). The patient is nervous/anxious and has insomnia.     Blood pressure (!) 150/78, pulse 83, temperature 98.7 F (37.1 C), temperature source Oral, resp. rate 18, SpO2 96 %.There is no height or weight on file to calculate BMI.  General Appearance: Fairly Groomed  Eye Contact:  Good  Speech:  Clear and Coherent and Normal Rate  Volume:  Normal  Mood:  Anxious and Depressed  Affect:  Congruent  Thought Process:  Goal Directed  Orientation:  Full (Time, Place, and Person)  Thought Content:  Rumination  Suicidal Thoughts:  Passive with multiple ways to do it but no spicific plan  Homicidal Thoughts:  No  Memory:  Immediate;   Good Recent;   Good Remote;   Good  Judgement:  Fair   Insight:  Lacking  Psychomotor Activity:  Decreased  Concentration:  Concentration: Fair and Attention Span: Fair  Recall:  Good  Fund of Knowledge:  Fair  Language:  Good  Akathisia:  No  Handed:  Right  AIMS (if indicated):     Assets:  Communication Skills Desire for Improvement Housing Social Support  ADL's:  Intact  Cognition:  WNL  Sleep:        Treatment Plan Summary: Daily contact with patient to assess and evaluate symptoms and progress in treatment and Medication management Restart home medications   Disposition: Recommend psychiatric Inpatient admission when medically cleared.  Rankin, Shuvon, NP 10/13/2016 1:49 PM

## 2016-10-13 NOTE — ED Notes (Signed)
Pt has come to desk several times this shift with multiple complaints pt has had repeated complaints that she is scared. At one point pt asked writer if I thought it was 'fair' that she or a person like her that is compliant and quite should have to be here with someone like him. Pt was reminded that she is here voluntarily and can leave any time. Pt has had multiple complaints including nausea, being cold, and being scared.

## 2016-10-13 NOTE — ED Notes (Addendum)
Original note was not meant for this patient.

## 2016-10-13 NOTE — ED Notes (Signed)
C/O anxiety.  PRN medications requested and received.

## 2016-10-13 NOTE — Progress Notes (Signed)
CSW faxed patient's referral to: [facility], [place], [place], [facility], [place], [place].   Abundio Miu, [facility]  Clinical Social Worker [phone]

## 2016-10-13 NOTE — ED Notes (Signed)
SBAR Report received from previous nurse. Pt received calm and visible on unit. Pt denies current  HI, A/V H, or pain at this time, and appears otherwise stable and free of distress. Pt endorses passive SI, but severe anxiety and depression.  Pt reminded of camera surveillance, q 15 min rounds, and rules of the milieu. Will continue to assess.

## 2016-10-13 NOTE — ED Notes (Signed)
Requested and received prn medications for nausea.

## 2016-10-14 ENCOUNTER — Other Ambulatory Visit: Payer: Self-pay

## 2016-10-14 DIAGNOSIS — Z810 Family history of intellectual disabilities: Secondary | ICD-10-CM | POA: Diagnosis not present

## 2016-10-14 DIAGNOSIS — R45851 Suicidal ideations: Secondary | ICD-10-CM

## 2016-10-14 DIAGNOSIS — F17210 Nicotine dependence, cigarettes, uncomplicated: Secondary | ICD-10-CM | POA: Diagnosis not present

## 2016-10-14 DIAGNOSIS — F332 Major depressive disorder, recurrent severe without psychotic features: Secondary | ICD-10-CM | POA: Diagnosis not present

## 2016-10-14 DIAGNOSIS — Z79899 Other long term (current) drug therapy: Secondary | ICD-10-CM | POA: Diagnosis not present

## 2016-10-14 MED ORDER — GABAPENTIN 100 MG PO CAPS: 200.0000 mg | ORAL_CAPSULE | Freq: Three times a day (TID) | ORAL | Status: DC | PRN

## 2016-10-14 MED ORDER — CITALOPRAM HYDROBROMIDE 10 MG PO TABS
10.0000 mg | ORAL_TABLET | Freq: Every day | ORAL | Status: DC
  Administered 2016-10-14 – 2016-10-15 (×2): 10 mg via ORAL
  Filled 2016-10-14 (×3): qty 1

## 2016-10-14 MED ORDER — GABAPENTIN 100 MG PO CAPS
200.0000 mg | ORAL_CAPSULE | Freq: Three times a day (TID) | ORAL | Status: DC
  Administered 2016-10-14 – 2016-10-15 (×3): 200 mg via ORAL
  Filled 2016-10-14 (×3): qty 2

## 2016-10-14 MED ORDER — LOSARTAN POTASSIUM 50 MG PO TABS
100.0000 mg | ORAL_TABLET | Freq: Every day | ORAL | Status: DC
  Administered 2016-10-14 – 2016-10-15 (×2): 100 mg via ORAL
  Filled 2016-10-14 (×2): qty 2

## 2016-10-14 MED ORDER — HYDROXYZINE HCL 25 MG PO TABS
50.0000 mg | ORAL_TABLET | Freq: Three times a day (TID) | ORAL | Status: DC | PRN
  Administered 2016-10-14 – 2016-10-15 (×2): 50 mg via ORAL
  Filled 2016-10-14 (×2): qty 2

## 2016-10-14 NOTE — Progress Notes (Signed)
CSW received a call from [facility], they are reviewing pt's referral with the physician and considering pt for admittance.  [facility] will call back with a decision.  Alphonse Guild. Devaughn Savant, Latanya Presser, LCAS Clinical Social Worker Ph: [phone]

## 2016-10-14 NOTE — ED Notes (Signed)
Long discussion with patient regarding pending transfer to [facility].  She is very angry and upset about going there.  States she was just there in February and feels they were no help at all.  States she only wants to go to [place].  When I explained that this was not an option at this time she insisted that the only reason she was under IVC was because the doctor is mad at her.  I assured her that this was not the case and that the paperwork was taken out for her safety.  I also explained that when we are applying patients under IVC that we are obligated to take the first available bed.  She continues to be argumentative and pacing.  I have offered as much reassurance as I am able, but patient continues to be argumentative and insists that the doctor is angry with her.

## 2016-10-14 NOTE — ED Notes (Signed)
Pt asking this nurse about her blood pressure medication, reports she takes Cozaar 100mg  at home. This nurse verified with pharmacy that pt last filled Cozaar 100mg  on 2/28, this nurse notified EDP Dr. Roderic Palau- ordered to start Cozaar 100mg  daily.

## 2016-10-14 NOTE — Patient Outreach (Signed)
[facility]) Care Management  10/14/2016  Kirsten Harper 03/07/1946 [id number]   EMMI pneumonia: Day # 3 REFERRAL DATE: 10/14/16 REFERRAL SOURCE; EMMI pneumonia red alert REFERRAL REASON; Fever or chills: YES, Swelling in hands/ feet or changes in weight: YES  Telephone call to patient regarding EMMI pneumonia red alert follow up. Unable to reach patient.  Message states patient is not accepting calls.  Update from hospital liaison, Natividad Brood... Patient is in emergency room at [place]:  [facility] will attempt 2nd telephone call to patient within 1 week   Quinn Plowman RN,BSN,CCM [facility] Telephonic  [phone]

## 2016-10-14 NOTE — Progress Notes (Signed)
10/14/16 1427:  LRT introduced self to pt and offered activities, pt refused.  Victorino Sparrow, LRT/CTRS

## 2016-10-14 NOTE — ED Notes (Signed)
Pt compliant with medication regimen. Pt voicing multiple complaints, including nausea and anxiety, prn medication given as indicated. Pt endorsing SI without plan. Encourgement and support provided. Special checks q 15 mins in place for safety. Video monitoring in place. Will continue to monitor.

## 2016-10-14 NOTE — ED Notes (Signed)
Pt requesting to speak with social worker, this nurse notified Junie Panning, social work.

## 2016-10-14 NOTE — Progress Notes (Addendum)
[facility] called and wants to know if the pt needs detox for benzodiazepenes.  CSW attempting to call Disposition for answer.  5:09 PM CSW was informed by RN pt has been accepted to [facility].  Please reconsult if future social work needs arise.    Alphonse Guild. Chace Bisch, Latanya Presser, LCAS Clinical Social Worker Ph: [phone]

## 2016-10-14 NOTE — BH Assessment (Signed)
[place] Assessment Progress Note  Per Corena Pilgrim, MD, this pt requires psychiatric hospitalization at this time.  She also meets criteria for IVC, which he has initiated.  IVC documents have been faxed to [facility], and at [facility] confirms receipt.  At [facility] calls to report that pt has been accepted to their facility by by Dr Selinda Flavin to [facility]; they will be ready to receive pt after 10:00 on 10/15/2016.  Waylan Boga, DNP, concurs with this decision.  Pt's nurse, Caryl Pina, has been notified, and agrees to call report to [phone].  Pt is to be transported via [facility].  Jalene Mullet, [employer] Triage Specialist [phone]

## 2016-10-14 NOTE — ED Notes (Signed)
Pt reports her day was terrible after she was told about going to [facility]. Pt stated she was there last year and feel like it was not helpful and it does not have individual therapy. Pt reports emotional and financial stressor. Pt reports she has no where to live once her mother dies. Pt denies SI/HI/AVH and pain.

## 2016-10-14 NOTE — ED Notes (Signed)
Pt talking on hallway phone.  

## 2016-10-14 NOTE — ED Notes (Signed)
This nurse notified Vernon Prey of pt concern in regards to disposition.

## 2016-10-14 NOTE — ED Notes (Signed)
This nurse notified chaplain that pt is wanting to speak with her, chaplain noted speaking with pt per her request.

## 2016-10-14 NOTE — Progress Notes (Signed)
   10/14/16 1200  Clinical Encounter Type  Visited With Patient;Health care provider (Nurse)  Visit Type Initial;Psychological support;Spiritual support;Behavioral Health;ED  Referral From Nurse  Consult/Referral To Chaplain  Spiritual Encounters  Spiritual Needs Emotional;Other (Comment) (Pastoral Conversation/Advance Directive )  Stress Factors  Patient Stress Factors Other (Comment);Major life changes;Family relationships;Financial concerns Market researcher )   I visited with the patient while rounding. The nurse stated that the patient had requested to see a Chaplain about her Advance Directive.  The patient stated that she is extremely anxious and depressed. She wants to change her primary healthcare power of attorney to her sister.  The patient is anxious about life changes, due to her mother's illness. The patient is worried about not having a home, due to her living with her mother.  Makaela voiced concerns about medical expenses and speaking with a Education officer, museum.  She is also anxious due to other patient's behavior.  We will follow up with the patient about her Advance Directive.   Please, contact Spiritual Care for further assistance.   [place] M.Div.

## 2016-10-14 NOTE — Consult Note (Signed)
[facility] Face-to-Face Psychiatry Consult   Reason for Consult:  Depression with suicide plan Referring Physician:  EDP Patient Identification: Kirsten Harper MRN:  [id number] Principal Diagnosis: Major depressive disorder, recurrent severe without psychotic features (HCC) Diagnosis:   Patient Active Problem List   Diagnosis Date Noted  . Major depressive disorder, recurrent severe without psychotic features (HCC) [F33.2] 08/27/2016    Priority: High  . GAD (generalized anxiety disorder) [F41.1] 08/27/2016    Priority: High  . 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 Benzodiazepine dependence in remission (HCC) [F13.21] 10/13/2016  . Community acquired pneumonia of right lower lobe of lung (HCC) [J18.1] 09/24/2016  . Ankylosing spondylitis of lumbosacral region (HCC) [M45.7] 09/24/2016  . 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 Iritis [H20.9] 06/06/2016  . High risk medication use [Z79.899] 06/06/2016  . Primary osteoarthritis of both hands [M19.041, M19.042] 06/06/2016  . Smoker [F17.200] 06/06/2016  . Acute respiratory failure with hypoxia (HCC) [J96.01] 04/10/2016  . COPD exacerbation (HCC) [J44.1] 04/10/2016  . Anxiety [F41.9] 04/10/2016  . Essential hypertension [I10]     Total Time spent with patient: 30 minutes  Subjective:   Kirsten Harper is a 71 y.o. female patient admitted with suicide plan.  HPI:  71 yo female who came to the ED with depression and plan to overdose.  She has not been able to function or care for her dying mother.  Kirsten Harper lives in a garage apartment on her mother's property and "feels alone in this world", hopeless, helpless, and worthless.  She cannot take care of herself or her mother, "I wish I would die."  No homicidal ideations, substance abuse, or hallucinations noted.  Past Psychiatric History: depressoin  Risk to Self: None Risk to Others: Homicidal Ideation: No Thoughts of Harm to  Others: No Current Homicidal Intent: No Current Homicidal Plan: No Access to Homicidal Means: No Identified Victim: none History of harm to others?: No Assessment of Violence: None Noted Violent Behavior Description: pt cooperative Does patient have access to weapons?: Yes (Comment) (pt has handgun for self defense) Criminal Charges Pending?: No Does patient have a court date: No Prior Inpatient Therapy: Prior Inpatient Therapy: Yes Prior Therapy Dates: Feb 2018, 1996 & 1992 Prior Therapy Facilty/Provider(s): [facility] [place] Reason for Treatment: to get off klonopin, substance abuse Prior Outpatient Therapy: Prior Outpatient Therapy: Yes Prior Therapy Dates: currently' Prior Therapy Facilty/Provider(s): Dr Plovsky Reason for Treatment: med management for anxiety and MDD Does patient have an ACCT team?: No Does patient have Intensive In-House Services?  : No Does patient have [facility] services? : No Does patient have P4CC services?: Unknown  Past Medical History:  Past Medical History:  Diagnosis Date  . Ankylosing spondylitis (HCC)   . Anxiety   . Arthritis   . Chronic lower back pain   . CKD (chronic kidney disease), stage II    "related to the Ankylosing spondylitis" (04/10/2016)  . COPD (chronic obstructive pulmonary disease) (HCC)   . Eye disease   . Hypertension   . PONV (postoperative nausea and vomiting)    "didn't bother me the last time I had it in ~ 2015"    Past Surgical History:  Procedure Laterality Date  . BROW LIFT AND BLEPHAROPLASTY Bilateral ~ 2015  . CATARACT EXTRACTION W/ INTRAOCULAR LENS  IMPLANT, BILATERAL Bilateral 2000s  . MANDIBLE SURGERY  ~ 2000   "jaw had moved to left; broke my jaw   in 5 places; put titanium in; stripped muscle from bone; wired jaw shut"  . TUBAL LIGATION  1979  . VAGINAL HYSTERECTOMY  1985   Family History:  Family History  Problem Relation Age of Onset  . Hypertension Mother   . Heart disease  Sister   . Diabetes Sister   . Pulmonary fibrosis Brother   . Alzheimer's disease Brother    Family Psychiatric  History: none Social History:  History  Alcohol Use  . 0.6 oz/week  . 1 Glasses of wine per week     History  Drug Use No    Social History   Social History  . Marital status: Divorced    Spouse name: N/A  . Number of children: N/A  . Years of education: N/A   Social History Main Topics  . Smoking status: Current Every Day Smoker    Packs/day: 0.50    Years: 51.00    Types: Cigarettes  . Smokeless tobacco: Never Used  . Alcohol use 0.6 oz/week    1 Glasses of wine per week  . Drug use: No  . Sexual activity: No   Other Topics Concern  . None   Social History Narrative  . None   Additional Social History:    Allergies:   Allergies  Allergen Reactions  . Amoxapine And Related     Patient reports it did not work for her.     Labs: No results found for this or any previous visit (from the past 48 hour(s)).  Current Facility-Administered Medications  Medication Dose Route Frequency Provider Last Rate Last Dose  . albuterol (PROVENTIL HFA;VENTOLIN HFA) 108 (90 Base) MCG/ACT inhaler 2 puff  2 puff Inhalation Q4H PRN Quintella Reichert, MD   2 puff at 10/13/16 2117  . busPIRone (BUSPAR) tablet 7.5 mg  7.5 mg Oral BID Quintella Reichert, MD   7.5 mg at 10/14/16 0932  . fluorometholone (FML) 0.1 % ophthalmic suspension 1 drop  1 drop Both Eyes TID Quintella Reichert, MD   1 drop at 10/14/16 0933  . gabapentin (NEURONTIN) capsule 200 mg  200 mg Oral TID PRN Patrecia Pour, NP      . hydrOXYzine (ATARAX/VISTARIL) tablet 50 mg  50 mg Oral TID PRN Patrecia Pour, NP      . losartan (COZAAR) tablet 100 mg  100 mg Oral Daily Milton Ferguson, MD   100 mg at 10/14/16 0931  . mirtazapine (REMERON) tablet 45 mg  45 mg Oral QHS Shuvon B Rankin, NP   45 mg at 10/13/16 2119  . ondansetron (ZOFRAN-ODT) disintegrating tablet 4 mg  4 mg Oral Q8H PRN Virgel Manifold, MD   4 mg at  10/14/16 0729  . saccharomyces boulardii (FLORASTOR) capsule 250 mg  250 mg Oral BID Quintella Reichert, MD   250 mg at 10/14/16 9924   Current Outpatient Prescriptions  Medication Sig Dispense Refill  . albuterol (PROVENTIL HFA;VENTOLIN HFA) 108 (90 Base) MCG/ACT inhaler Inhale 2 puffs into the lungs every 4 (four) hours as needed for wheezing or shortness of breath (cough, shortness of breath or wheezing.). 1 Inhaler 1  . busPIRone (BUSPAR) 7.5 MG tablet Take 1 tablet (7.5 mg total) by mouth 2 (two) times daily. 20 tablet 0  . fluorometholone (FML) 0.1 % ophthalmic suspension Place 1 drop into both eyes 3 (three) times daily.  5  . mirtazapine (REMERON) 45 MG tablet Take 45 mg by mouth at bedtime.    . ondansetron (ZOFRAN) 8 MG tablet  Take 8 mg by mouth every 8 (eight) hours as needed for nausea.     Marland Kitchen saccharomyces boulardii (FLORASTOR) 250 MG capsule Take 1 capsule (250 mg total) by mouth 2 (two) times daily. 10 capsule 0  . SYMBICORT 160-4.5 MCG/ACT inhaler Inhale 2 puffs into the lungs daily.       Musculoskeletal: Strength & Muscle Tone: within normal limits Gait & Station: normal Patient leans: N/A  Psychiatric Specialty Exam: Physical Exam  Constitutional: She is oriented to person, place, and time. She appears well-developed and well-nourished.  HENT:  Head: Normocephalic.  Neck: Normal range of motion.  Respiratory: Effort normal.  Musculoskeletal: Normal range of motion.  Neurological: She is alert and oriented to person, place, and time.  Psychiatric: Her speech is normal and behavior is normal. Judgment normal. Cognition and memory are normal. She exhibits a depressed mood. She expresses suicidal ideation. She expresses suicidal plans.    Review of Systems  Psychiatric/Behavioral: Positive for depression, substance abuse and suicidal ideas. The patient is nervous/anxious.   All other systems reviewed and are negative.   Blood pressure (!) 162/77, pulse 75, temperature 98.8  F (37.1 C), temperature source Oral, resp. rate 18, SpO2 96 %.There is no height or weight on file to calculate BMI.  General Appearance: Casual  Eye Contact:  Good  Speech:  Normal Rate  Volume:  Decreased  Mood:  Anxious and Depressed  Affect:  Congruent  Thought Process:  Coherent and Descriptions of Associations: Intact  Orientation:  Full (Time, Place, and Person)  Thought Content:  Rumination  Suicidal Thoughts:  Yes.  with intent/plan  Homicidal Thoughts:  No  Memory:  Immediate;   Fair Recent;   Fair Remote;   Fair  Judgement:  Impaired  Insight:  Fair  Psychomotor Activity:  Decreased  Concentration:  Concentration: Fair and Attention Span: Fair  Recall:  [employer] of Knowledge:  Fair  Language:  Good  Akathisia:  No  Handed:  Right  AIMS (if indicated):     Assets:  Leisure Time Physical Health Resilience Social Support  ADL's:  Intact  Cognition:  WNL  Sleep:        Treatment Plan Summary: Daily contact with patient to assess and evaluate symptoms and progress in treatment, Medication management and Plan major depressive disorder, recurrent, severe without psychosis:  -Crisis stabilization -Medication management:  Restarted medical medications along with Buspar 7.5 mg BID for anxiety, Gabapentin 200 mg TID for anxiety, Remeron 45 mg at bedtime for sleep and depression, and Vistaril 50 mg TID PRN anxiety.  STarted Celexa 10 mg daily for depression -Individual counseling  Disposition: Recommend psychiatric Inpatient admission when medically cleared.  Waylan Boga, NP 10/14/2016 2:04 PM  Patient seen face-to-face for psychiatric evaluation, chart reviewed and case discussed with the physician extender and developed treatment plan. Reviewed the information documented and agree with the treatment plan. Corena Pilgrim, MD

## 2016-10-14 NOTE — ED Notes (Signed)
This nurse notified pt that she had been accepted to [facility] for tomorrow, pt then became upset and reports she does not want to go there, pt stating she was recently d/c from there and that she knows what that hospital is about. Encouragement and support provided, pt remains upset,. Dawnaly charge nurse to pt room to provide additional support. Will continue to monitor.

## 2016-10-15 ENCOUNTER — Other Ambulatory Visit: Payer: Self-pay

## 2016-10-15 DIAGNOSIS — F332 Major depressive disorder, recurrent severe without psychotic features: Secondary | ICD-10-CM | POA: Diagnosis not present

## 2016-10-15 NOTE — ED Notes (Signed)
Attempted to call report to [facility] and there was no answer. [facility] called and are calling [facility] to verify available bed prior to transport.

## 2016-10-15 NOTE — ED Notes (Signed)
Pt transported to [facility] by the [employer]. All belongings returned to pt who signed for same. Pt was cooperative, but remained anxious about riding with the sheriff.

## 2016-10-15 NOTE — Patient Outreach (Signed)
[facility]) Care Management  10/15/2016  Kirsten Harper [birth year] [id number]   Per Epic note 10/15/16:  Patient transported to [facility].    PLAN: [facility] will refer patient to care management assistant to discontinue EMMI pneumonia calls due to patient being admitted to another care facility.  RNCM will notify patients primary MD of closure.   Quinn Plowman RN,BSN,CCM [facility] Telephonic  [phone]

## 2016-10-16 DIAGNOSIS — F3181 Bipolar II disorder: Secondary | ICD-10-CM | POA: Diagnosis not present

## 2016-10-17 DIAGNOSIS — F3181 Bipolar II disorder: Secondary | ICD-10-CM | POA: Diagnosis not present

## 2016-10-18 DIAGNOSIS — F3181 Bipolar II disorder: Secondary | ICD-10-CM | POA: Diagnosis not present

## 2016-10-19 DIAGNOSIS — F3181 Bipolar II disorder: Secondary | ICD-10-CM | POA: Diagnosis not present

## 2016-10-21 DIAGNOSIS — F3181 Bipolar II disorder: Secondary | ICD-10-CM | POA: Diagnosis not present

## 2016-10-22 DIAGNOSIS — F3181 Bipolar II disorder: Secondary | ICD-10-CM | POA: Diagnosis not present

## 2016-10-23 DIAGNOSIS — F3181 Bipolar II disorder: Secondary | ICD-10-CM | POA: Diagnosis not present

## 2016-10-24 DIAGNOSIS — F3181 Bipolar II disorder: Secondary | ICD-10-CM | POA: Diagnosis not present

## 2016-10-28 NOTE — Progress Notes (Deleted)
Office Visit Note  Patient: Kirsten Harper             Date of Birth: 02-29-1946           MRN: [id number]             PCP: Osborne Casco, MD Referring: Kelton Pillar, MD Visit Date: 11/08/2016 Occupation: _0 @    Subjective:  No chief complaint on file.   History of Present Illness: IDELLA LAMONTAGNE is a 71 y.o. female ***   Activities of Daily Living:  Patient reports morning stiffness for *** {minute/hour:19697}.   Patient {ACTIONS;DENIES/REPORTS:21021675::"Denies"} nocturnal pain.  Difficulty dressing/grooming: {ACTIONS;DENIES/REPORTS:21021675::"Denies"} Difficulty climbing stairs: {ACTIONS;DENIES/REPORTS:21021675::"Denies"} Difficulty getting out of chair: {ACTIONS;DENIES/REPORTS:21021675::"Denies"} Difficulty using hands for taps, buttons, cutlery, and/or writing: {ACTIONS;DENIES/REPORTS:21021675::"Denies"}   No Rheumatology ROS completed.   PMFS History:  Patient Active Problem List   Diagnosis Date Noted  . Benzodiazepine dependence in remission ([place]) 10/13/2016  . Community acquired pneumonia of right lower lobe of lung ([place]) 09/24/2016  . Ankylosing spondylitis of lumbosacral region ([place]) 09/24/2016  . Major depressive disorder, recurrent severe without psychotic features ([place]) 08/27/2016  . GAD (generalized anxiety disorder) 08/27/2016  . Sedative hypnotic or anxiolytic dependence ([place]) 08/27/2016  . Spondyloarthropathy HLA-B27 positive 06/06/2016  . Iritis 06/06/2016  . High risk medication use 06/06/2016  . Primary osteoarthritis of both hands 06/06/2016  . Smoker 06/06/2016  . Acute respiratory failure with hypoxia ([place]) 04/10/2016  . COPD exacerbation ([place]) 04/10/2016  . Anxiety 04/10/2016  . Essential hypertension     Past Medical History:  Diagnosis Date  . Ankylosing spondylitis ([place])   . Anxiety   . Arthritis   . Chronic lower back pain   . CKD (chronic kidney disease), stage II    "related to the Ankylosing spondylitis"  (04/10/2016)  . COPD (chronic obstructive pulmonary disease) ([place])   . Eye disease   . Hypertension   . PONV (postoperative nausea and vomiting)    "didn't bother me the last time I had it in ~ 2015"    Family History  Problem Relation Age of Onset  . Hypertension Mother   . Heart disease Sister   . Diabetes Sister   . Pulmonary fibrosis Brother   . Alzheimer's disease Brother    Past Surgical History:  Procedure Laterality Date  . BROW LIFT AND BLEPHAROPLASTY Bilateral ~ 2015  . CATARACT EXTRACTION W/ INTRAOCULAR LENS  IMPLANT, BILATERAL Bilateral 2000s  . MANDIBLE SURGERY  ~ 2000   "jaw had moved to left; broke my jaw in 5 places; put titanium in; stripped muscle from bone; wired jaw shut"  . TUBAL LIGATION  1979  . VAGINAL HYSTERECTOMY  1985   Social History   Social History Narrative  . No narrative on file     Objective: Vital Signs: There were no vitals taken for this visit.   Physical Exam   Musculoskeletal Exam: ***  CDAI Exam: No CDAI exam completed.    Investigation: No additional findings. CBC Latest Ref Rng & Units 10/12/2016 09/25/2016 09/24/2016  WBC 4.0 - 10.5 K/uL 7.1 4.7 5.2  Hemoglobin 12.0 - 15.0 g/dL 14.1 11.7(L) 11.3(L)  Hematocrit 36.0 - 46.0 % 40.7 34.4(L) 32.8(L)  Platelets 150 - 400 K/uL 250 149(L) 156    CMP Latest Ref Rng & Units 10/12/2016 09/25/2016 09/24/2016  Glucose 65 - 99 mg/dL 112(H) 137(H) 153(H)  BUN 6 - 20 mg/dL _1 Creatinine 0.44 - 1.00 mg/dL 0.80 0.89 0.88  Sodium  135 - 145 mmol/L 139 137 136  Potassium 3.5 - 5.1 mmol/L 3.9 3.5 3.5  Chloride 101 - 111 mmol/L 107 104 105  CO2 22 - 32 mmol/L _0 Calcium 8.9 - 10.3 mg/dL 9.7 8.3(L) 8.0(L)  Total Protein 6.5 - 8.1 g/dL 7.3 - -  Total Bilirubin 0.3 - 1.2 mg/dL 1.5(H) - -  Alkaline Phos 38 - 126 U/L 59 - -  AST 15 - 41 U/L 21 - -  ALT 14 - 54 U/L 25 - -    Imaging: Dg Chest 2 View  Result Date: 10/13/2016 CLINICAL DATA:  Medical clearance for psychiatric  admission. EXAM: CHEST  2 VIEW COMPARISON:  09/23/2016 chest radiograph. FINDINGS: Stable cardiomediastinal silhouette with normal heart size. No pneumothorax. No pleural effusion. No pulmonary edema. Persistent patchy opacity in the retrocardiac left lower lobe, possibly slightly decreased. No new consolidative airspace disease. IMPRESSION: Persistent patchy retrocardiac left lower lobe opacity, possibly slightly decreased since 09/23/2016, favor a slowly resolving pneumonia given absence of this opacity on the 08/26/2016 chest radiograph. Recommend follow-up PA and lateral chest radiographs in 6-8 weeks. Electronically Signed   By: Ilona Sorrel M.D.   On: 10/13/2016 15:01    Speciality Comments: No specialty comments available.    Procedures:  No procedures performed Allergies: Amoxapine and related   Assessment / Plan:     Visit Diagnoses: High risk medication use  Ankylosing spondylitis of lumbosacral region ([place])  Spondyloarthropathy HLA-B27 positive  Primary osteoarthritis of both hands  Iritis  Smoker  History of COPD  History of depression  History of hypertension    Orders: No orders of the defined types were placed in this encounter.  No orders of the defined types were placed in this encounter.   Face-to-face time spent with patient was *** minutes. 50% of time was spent in counseling and coordination of care.  Follow-Up Instructions: No Follow-up on file.   Fatima Fedie, RT  Note - This record has been created using [employer].  Chart creation errors have been sought, but may not always  have been located. Such creation errors do not reflect on  the standard of medical care.

## 2016-10-29 ENCOUNTER — Telehealth: Payer: Self-pay | Admitting: Family Medicine

## 2016-10-29 ENCOUNTER — Ambulatory Visit: Payer: Medicare Other | Admitting: Internal Medicine

## 2016-10-29 NOTE — Telephone Encounter (Signed)
You can try to get her scheduled sooner
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
## 2016-10-29 NOTE — Telephone Encounter (Addendum)
I will ask Dr Silvio Pate. She was a new pt transferring from Dr Laurann Montana.

## 2016-11-07 DIAGNOSIS — G8929 Other chronic pain: Secondary | ICD-10-CM | POA: Diagnosis not present

## 2016-11-07 DIAGNOSIS — J449 Chronic obstructive pulmonary disease, unspecified: Secondary | ICD-10-CM | POA: Diagnosis not present

## 2016-11-07 DIAGNOSIS — M459 Ankylosing spondylitis of unspecified sites in spine: Secondary | ICD-10-CM | POA: Diagnosis not present

## 2016-11-07 DIAGNOSIS — I10 Essential (primary) hypertension: Secondary | ICD-10-CM | POA: Diagnosis not present

## 2016-11-07 DIAGNOSIS — H44139 Sympathetic uveitis, unspecified eye: Secondary | ICD-10-CM | POA: Diagnosis not present

## 2016-11-07 DIAGNOSIS — N189 Chronic kidney disease, unspecified: Secondary | ICD-10-CM | POA: Diagnosis not present

## 2016-11-07 DIAGNOSIS — M545 Low back pain: Secondary | ICD-10-CM | POA: Diagnosis not present

## 2016-11-08 ENCOUNTER — Ambulatory Visit: Payer: Self-pay | Admitting: Rheumatology

## 2016-11-08 DIAGNOSIS — N390 Urinary tract infection, site not specified: Secondary | ICD-10-CM | POA: Diagnosis not present

## 2016-11-08 DIAGNOSIS — I10 Essential (primary) hypertension: Secondary | ICD-10-CM | POA: Diagnosis not present

## 2016-11-08 DIAGNOSIS — G8929 Other chronic pain: Secondary | ICD-10-CM | POA: Diagnosis not present

## 2016-11-08 DIAGNOSIS — M459 Ankylosing spondylitis of unspecified sites in spine: Secondary | ICD-10-CM | POA: Diagnosis not present

## 2016-11-09 DIAGNOSIS — R0602 Shortness of breath: Secondary | ICD-10-CM | POA: Diagnosis not present

## 2016-11-09 DIAGNOSIS — R110 Nausea: Secondary | ICD-10-CM | POA: Diagnosis not present

## 2016-11-09 DIAGNOSIS — R634 Abnormal weight loss: Secondary | ICD-10-CM | POA: Diagnosis not present

## 2016-11-09 DIAGNOSIS — J40 Bronchitis, not specified as acute or chronic: Secondary | ICD-10-CM | POA: Diagnosis not present

## 2016-11-09 DIAGNOSIS — I10 Essential (primary) hypertension: Secondary | ICD-10-CM | POA: Diagnosis not present

## 2016-11-09 DIAGNOSIS — R05 Cough: Secondary | ICD-10-CM | POA: Diagnosis not present

## 2016-11-09 DIAGNOSIS — J449 Chronic obstructive pulmonary disease, unspecified: Secondary | ICD-10-CM | POA: Diagnosis not present

## 2016-11-10 DIAGNOSIS — H44139 Sympathetic uveitis, unspecified eye: Secondary | ICD-10-CM | POA: Diagnosis not present

## 2016-11-10 DIAGNOSIS — I10 Essential (primary) hypertension: Secondary | ICD-10-CM | POA: Diagnosis not present

## 2016-11-10 DIAGNOSIS — M459 Ankylosing spondylitis of unspecified sites in spine: Secondary | ICD-10-CM | POA: Diagnosis not present

## 2016-11-10 DIAGNOSIS — J449 Chronic obstructive pulmonary disease, unspecified: Secondary | ICD-10-CM | POA: Diagnosis not present

## 2016-11-10 DIAGNOSIS — G8929 Other chronic pain: Secondary | ICD-10-CM | POA: Diagnosis not present

## 2016-11-12 DIAGNOSIS — H20021 Recurrent acute iridocyclitis, right eye: Secondary | ICD-10-CM | POA: Diagnosis not present

## 2016-11-12 DIAGNOSIS — Z961 Presence of intraocular lens: Secondary | ICD-10-CM | POA: Diagnosis not present

## 2016-11-14 ENCOUNTER — Telehealth: Payer: Self-pay | Admitting: Rheumatology

## 2016-11-14 NOTE — Telephone Encounter (Signed)
Patient having a lot of pain and discomfort in back. Scheduled rov June. Question what she can do for pain.

## 2016-11-18 ENCOUNTER — Telehealth: Payer: Self-pay | Admitting: Rheumatology

## 2016-11-18 NOTE — Telephone Encounter (Signed)
Attempted to contact the patient and unable to leave a message, wireless customer not available.

## 2016-11-18 NOTE — Telephone Encounter (Signed)
Patient states she is not currently having back pain. Patient was trying to just see if she could get worked in for a sooner appointment than the June appointment she has been scheduled for.

## 2016-11-18 NOTE — Telephone Encounter (Signed)
I have advised patient to discuss with her PCP to you [facility]

## 2016-11-18 NOTE — Telephone Encounter (Signed)
Patient returned the phone call. Please call patient back about what she can do for pain relief.

## 2016-11-18 NOTE — Telephone Encounter (Signed)
Patient wanting to know if Dr. Lilian Coma prescribe anything for nerves. She is having lots of anxiety due to bad vision. Patient seeing a eye Dr. Marylene Buerger. Please advise. Patient uses [facility].

## 2016-11-19 DIAGNOSIS — H35033 Hypertensive retinopathy, bilateral: Secondary | ICD-10-CM | POA: Diagnosis not present

## 2016-11-19 DIAGNOSIS — M479 Spondylosis, unspecified: Secondary | ICD-10-CM | POA: Diagnosis not present

## 2016-11-19 DIAGNOSIS — H209 Unspecified iridocyclitis: Secondary | ICD-10-CM | POA: Diagnosis not present

## 2016-11-19 DIAGNOSIS — H35371 Puckering of macula, right eye: Secondary | ICD-10-CM | POA: Diagnosis not present

## 2016-11-19 DIAGNOSIS — M0560 Rheumatoid arthritis of unspecified site with involvement of other organs and systems: Secondary | ICD-10-CM | POA: Diagnosis not present

## 2016-11-19 DIAGNOSIS — R3915 Urgency of urination: Secondary | ICD-10-CM | POA: Diagnosis not present

## 2016-11-19 DIAGNOSIS — N183 Chronic kidney disease, stage 3 (moderate): Secondary | ICD-10-CM | POA: Diagnosis not present

## 2016-11-19 DIAGNOSIS — Z961 Presence of intraocular lens: Secondary | ICD-10-CM | POA: Diagnosis not present

## 2016-11-19 DIAGNOSIS — H0233 Blepharochalasis right eye, unspecified eyelid: Secondary | ICD-10-CM | POA: Diagnosis not present

## 2016-11-19 DIAGNOSIS — F39 Unspecified mood [affective] disorder: Secondary | ICD-10-CM | POA: Diagnosis not present

## 2016-11-25 ENCOUNTER — Other Ambulatory Visit: Payer: Self-pay

## 2016-11-25 DIAGNOSIS — Z79899 Other long term (current) drug therapy: Secondary | ICD-10-CM | POA: Diagnosis not present

## 2016-11-25 LAB — CBC WITH DIFFERENTIAL/PLATELET
Basophils Absolute: 0 cells/uL (ref 0–200)
Basophils Relative: 0 %
EOS PCT: 4 %
Eosinophils Absolute: 280 cells/uL (ref 15–500)
HCT: 42.5 % (ref 35.0–45.0)
HEMOGLOBIN: 14.3 g/dL (ref 11.7–15.5)
LYMPHS ABS: 1540 {cells}/uL (ref 850–3900)
Lymphocytes Relative: 22 %
MCH: 30.5 pg (ref 27.0–33.0)
MCHC: 33.6 g/dL (ref 32.0–36.0)
MCV: 90.6 fL (ref 80.0–100.0)
MPV: 9.8 fL (ref 7.5–12.5)
Monocytes Absolute: 560 cells/uL (ref 200–950)
Monocytes Relative: 8 %
NEUTROS ABS: 4620 {cells}/uL (ref 1500–7800)
NEUTROS PCT: 66 %
Platelets: 250 10*3/uL (ref 140–400)
RBC: 4.69 MIL/uL (ref 3.80–5.10)
RDW: 14 % (ref 11.0–15.0)
WBC: 7 10*3/uL (ref 3.8–10.8)

## 2016-11-26 DIAGNOSIS — I129 Hypertensive chronic kidney disease with stage 1 through stage 4 chronic kidney disease, or unspecified chronic kidney disease: Secondary | ICD-10-CM | POA: Diagnosis not present

## 2016-11-26 DIAGNOSIS — M479 Spondylosis, unspecified: Secondary | ICD-10-CM | POA: Diagnosis not present

## 2016-11-26 DIAGNOSIS — M0560 Rheumatoid arthritis of unspecified site with involvement of other organs and systems: Secondary | ICD-10-CM | POA: Diagnosis not present

## 2016-11-26 DIAGNOSIS — N182 Chronic kidney disease, stage 2 (mild): Secondary | ICD-10-CM | POA: Diagnosis not present

## 2016-11-26 DIAGNOSIS — F39 Unspecified mood [affective] disorder: Secondary | ICD-10-CM | POA: Diagnosis not present

## 2016-11-26 LAB — COMPLETE METABOLIC PANEL WITH GFR
ALBUMIN: 4.3 g/dL (ref 3.6–5.1)
ALT: 12 U/L (ref 6–29)
AST: 12 U/L (ref 10–35)
Alkaline Phosphatase: 52 U/L (ref 33–130)
BUN: 13 mg/dL (ref 7–25)
CHLORIDE: 104 mmol/L (ref 98–110)
CO2: 28 mmol/L (ref 20–31)
Calcium: 9.7 mg/dL (ref 8.6–10.4)
Creat: 0.92 mg/dL (ref 0.50–0.99)
GFR, Est African American: 73 mL/min (ref >=60)
GFR, Est Non African American: 64 mL/min (ref >=60)
Glucose, Bld: 113 mg/dL — ABNORMAL HIGH (ref 65–99)
POTASSIUM: 4.5 mmol/L (ref 3.5–5.3)
SODIUM: 141 mmol/L (ref 135–146)
Total Bilirubin: 1.1 mg/dL (ref 0.2–1.2)
Total Protein: 6.8 g/dL (ref 6.1–8.1)

## 2016-11-26 NOTE — Progress Notes (Signed)
Within normal limits

## 2016-11-27 ENCOUNTER — Telehealth: Payer: Self-pay | Admitting: Rheumatology

## 2016-11-27 DIAGNOSIS — F329 Major depressive disorder, single episode, unspecified: Secondary | ICD-10-CM | POA: Diagnosis not present

## 2016-11-27 NOTE — Telephone Encounter (Signed)
Patient called about lab results. She is requesting a call back asap please.

## 2016-11-27 NOTE — Telephone Encounter (Signed)
Patient advised lab results are normal 

## 2016-11-28 DIAGNOSIS — F329 Major depressive disorder, single episode, unspecified: Secondary | ICD-10-CM | POA: Diagnosis not present

## 2016-12-04 DIAGNOSIS — Z720 Tobacco use: Secondary | ICD-10-CM | POA: Diagnosis not present

## 2016-12-04 DIAGNOSIS — J209 Acute bronchitis, unspecified: Secondary | ICD-10-CM | POA: Diagnosis not present

## 2016-12-04 DIAGNOSIS — J449 Chronic obstructive pulmonary disease, unspecified: Secondary | ICD-10-CM | POA: Diagnosis not present

## 2016-12-13 NOTE — Progress Notes (Signed)
Office Visit Note  Patient: Kirsten Harper             Date of Birth: [birth year]           MRN: [id number]             PCP: Kelton Pillar, MD Referring: Kelton Pillar, MD Visit Date: 12/19/2016 Occupation: '@GUAROCC'$ @    Subjective:  Uveitis   History of Present Illness: Kirsten Harper is a 71 y.o. female with history of ankylosing spondylitis. According to patient she was recently diagnosed with uveitis. She's been seeing Dr. Dwana Melena at [facility]. She was started on methotrexate but could not tolerate the medication due to nausea. She's tried it twice. During her recent visit with him he gave her a prescription for CellCept but she has not started that yet. She had recent bout of bronchitis for which she was on amoxicillin. She believes that she may have yeast infection. Her uveitis is not getting better. Her joints are better currently. She states she's not been taking sulfasalazine on regular basis. She does have some lower back discomfort.  Activities of Daily Living:  Patient reports morning stiffness for 0 minutes.   Patient Denies nocturnal pain.  Difficulty dressing/grooming: Denies Difficulty climbing stairs: Denies Difficulty getting out of chair: Denies Difficulty using hands for taps, buttons, cutlery, and/or writing: Denies   Review of Systems  Constitutional: Negative for fatigue, night sweats, weight gain, weight loss and weakness.  HENT: Negative for mouth sores, trouble swallowing, trouble swallowing, mouth dryness and nose dryness.   Eyes: Positive for redness, visual disturbance and dryness. Negative for pain.  Respiratory: Negative for cough, shortness of breath and difficulty breathing.   Cardiovascular: Negative for chest pain, palpitations, hypertension, irregular heartbeat and swelling in legs/feet.  Gastrointestinal: Negative for blood in stool, constipation and diarrhea.  Endocrine: Negative for increased urination.  Genitourinary:  Negative for vaginal dryness.  Musculoskeletal: Positive for arthralgias and joint pain. Negative for joint swelling, myalgias, muscle weakness, morning stiffness, muscle tenderness and myalgias.  Skin: Negative for color change, rash, hair loss, skin tightness, ulcers and sensitivity to sunlight.  Allergic/Immunologic: Negative for susceptible to infections.  Neurological: Negative for dizziness, memory loss and night sweats.  Hematological: Negative for swollen glands.  Psychiatric/Behavioral: Negative for depressed mood and sleep disturbance. The patient is not nervous/anxious.     PMFS History:  Patient Active Problem List   Diagnosis Date Noted  . Benzodiazepine dependence in remission ([place]) 10/13/2016  . Community acquired pneumonia of right lower lobe of lung ([place]) 09/24/2016  . Ankylosing spondylitis of lumbosacral region ([place]) 09/24/2016  . Major depressive disorder, recurrent severe without psychotic features ([place]) 08/27/2016  . GAD (generalized anxiety disorder) 08/27/2016  . 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 Spondyloarthropathy HLA-B27 positive 06/06/2016  . Iritis 06/06/2016  . High risk medication use 06/06/2016  . Primary osteoarthritis of both hands 06/06/2016  . Smoker 06/06/2016  . Acute respiratory failure with hypoxia ([place]) 04/10/2016  . COPD exacerbation ([place]) 04/10/2016  . Anxiety 04/10/2016  . Essential hypertension     Past Medical History:  Diagnosis Date  . Ankylosing spondylitis ([place])   . Anxiety   . Arthritis   . Chronic lower back pain   . CKD (chronic kidney disease), stage II    "related to the Ankylosing spondylitis" (04/10/2016)  . COPD (chronic obstructive pulmonary disease) ([place])   . Eye disease   . Hypertension   . PONV (postoperative  nausea and vomiting)    "didn't bother me the last time I had it in ~ 2015"    Family History  Problem Relation Age of Onset  . Hypertension Mother   . Heart disease Sister   .  Diabetes Sister   . Pulmonary fibrosis Brother   . Alzheimer's disease Brother    Past Surgical History:  Procedure Laterality Date  . BROW LIFT AND BLEPHAROPLASTY Bilateral ~ 2015  . CATARACT EXTRACTION W/ INTRAOCULAR LENS  IMPLANT, BILATERAL Bilateral 2000s  . MANDIBLE SURGERY  ~ 2000   "jaw had moved to left; broke my jaw in 5 places; put titanium in; stripped muscle from bone; wired jaw shut"  . TUBAL LIGATION  1979  . VAGINAL HYSTERECTOMY  1985   Social History   Social History Narrative  . No narrative on file     Objective: Vital Signs: BP 107/67   Pulse 70   Resp 14   Ht 5' 7.5" (1.715 m)   Wt 167 lb (75.8 kg)   BMI 25.77 kg/m    Physical Exam  Constitutional: She is oriented to person, place, and time. She appears well-developed and well-nourished.  HENT:  Head: Normocephalic and atraumatic.  Eyes: Conjunctivae and EOM are normal.  Conjunctival injection  Neck: Normal range of motion.  Cardiovascular: Normal rate, regular rhythm, normal heart sounds and intact distal pulses.   Pulmonary/Chest: Effort normal and breath sounds normal.  Abdominal: Soft. Bowel sounds are normal.  Lymphadenopathy:    She has no cervical adenopathy.  Neurological: She is alert and oriented to person, place, and time.  Skin: Skin is warm and dry. Capillary refill takes less than 2 seconds.  Psychiatric: She has a normal mood and affect. Her behavior is normal.  Nursing note and vitals reviewed.    Musculoskeletal Exam: C-spine and thoracic lumbar spine good range of motion. Shoulder joints elbow joints wrist joint MCPs PIPs DIPs with good range of motion. Hip joints knee joints ankles MTPs PIPs with good range of motion. No synovitis. She has mild tenderness over the SI joint area.  CDAI Exam: No CDAI exam completed.    Investigation: No additional findings. CBC Latest Ref Rng & Units 11/25/2016 10/12/2016 09/25/2016  WBC 3.8 - 10.8 K/uL 7.0 7.1 4.7  Hemoglobin 11.7 - 15.5 g/dL  14.3 14.1 11.7(L)  Hematocrit 35.0 - 45.0 % 42.5 40.7 34.4(L)  Platelets 140 - 400 K/uL 250 250 149(L)   CMP Latest Ref Rng & Units 11/25/2016 10/12/2016 09/25/2016  Glucose 65 - 99 mg/dL 113(H) 112(H) 137(H)  BUN 7 - 25 mg/dL '13 11 13  '$ Creatinine 0.50 - 0.99 mg/dL 0.92 0.80 0.89  Sodium 135 - 146 mmol/L 141 139 137  Potassium 3.5 - 5.3 mmol/L 4.5 3.9 3.5  Chloride 98 - 110 mmol/L 104 107 104  CO2 20 - 31 mmol/L '28 25 24  '$ Calcium 8.6 - 10.4 mg/dL 9.7 9.7 8.3(L)  Total Protein 6.1 - 8.1 g/dL 6.8 7.3 -  Total Bilirubin 0.2 - 1.2 mg/dL 1.1 1.5(H) -  Alkaline Phos 33 - 130 U/L 52 59 -  AST 10 - 35 U/L 12 21 -  ALT 6 - 29 U/L 12 25 -    Imaging: No results found.  Speciality Comments: No specialty comments available.    Procedures:  No procedures performed Allergies: Amoxapine and related   Assessment / Plan:     Visit Diagnoses: Ankylosing spondylitis of lumbosacral region ([place]) - History of SI joint sclerosis, knee  joint effusion, or erosive changes in hands and feet. Patient has been quite, noncompliant with the treatment. She states she takes sulfasalazine only intermittently. She has been given methotrexate by her ophthalmologist which she took twice and discontinued due to nausea. She states she gets recurrent bronchitis so she is hesitant to take any medications. I had detailed discussion with her ophthalmologist Dr. Manuella Ghazi he confirmed the patient has uveitis. He is given her a recent prescription for CellCept. He stated that he would prefer for her to take methotrexate or Humira. We had detailed discussion with the patient regarding the treatment options after discussing indications side effects contraindications she wanted to proceed with Humira. Informed consent was obtained and a handout was given. We will apply for Humira. Due to that I will obtain following labs and a repeat chest x-ray. She's been advised to get pneumococcal vaccine and Shingrix. She's been also advised to see a  dermatologist for baseline exam and then on yearly exam.  HLA B 27 Positive   Uveitis - Followed up by Dr. Dwana Melena   High risk medication use - Sulfasalazine 500 mg by mouth twice a day - Plan: Quantiferon tb gold assay (blood), IgG, IgA, IgM, HIV antibody, DG Chest 2 View  Noncompliance - With medication use  Primary osteoarthritis of both hands: Some stiffness  Smoker former copd  Essential hypertension  History of COPD: Recent bronchitis she's on amoxicillin. She's also concerned that she may have recent infection. Have advised to follow up with her PCP.  History of anxiety and depression : Her anxiety is not well controlled. I've advised her to follow PCP regarding that.   Orders: Orders Placed This Encounter  Procedures  . DG Chest 2 View  . Quantiferon tb gold assay (blood)  . IgG, IgA, IgM  . HIV antibody   No orders of the defined types were placed in this encounter.   Face-to-face time spent with patient was 40 minutes. 50% of time was spent in counseling and coordination of care.  Follow-Up Instructions: Return in about 4 weeks (around 01/16/2017) for Ankylosing spondylitis,Uveitis.   Bo Merino, MD  Note - This record has been created using Editor, commissioning.  Chart creation errors have been sought, but may not always  have been located. Such creation errors do not reflect on  the standard of medical care.

## 2016-12-16 DIAGNOSIS — F410 Panic disorder [episodic paroxysmal anxiety] without agoraphobia: Secondary | ICD-10-CM | POA: Diagnosis not present

## 2016-12-16 DIAGNOSIS — F332 Major depressive disorder, recurrent severe without psychotic features: Secondary | ICD-10-CM | POA: Diagnosis not present

## 2016-12-17 DIAGNOSIS — H35033 Hypertensive retinopathy, bilateral: Secondary | ICD-10-CM | POA: Diagnosis not present

## 2016-12-17 DIAGNOSIS — H0231 Blepharochalasis right upper eyelid: Secondary | ICD-10-CM | POA: Diagnosis not present

## 2016-12-17 DIAGNOSIS — H35371 Puckering of macula, right eye: Secondary | ICD-10-CM | POA: Diagnosis not present

## 2016-12-17 DIAGNOSIS — H209 Unspecified iridocyclitis: Secondary | ICD-10-CM | POA: Diagnosis not present

## 2016-12-17 DIAGNOSIS — Z961 Presence of intraocular lens: Secondary | ICD-10-CM | POA: Diagnosis not present

## 2016-12-19 ENCOUNTER — Telehealth: Payer: Self-pay

## 2016-12-19 ENCOUNTER — Encounter: Payer: Self-pay | Admitting: Rheumatology

## 2016-12-19 ENCOUNTER — Ambulatory Visit (INDEPENDENT_AMBULATORY_CARE_PROVIDER_SITE_OTHER): Payer: Medicare Other | Admitting: Rheumatology

## 2016-12-19 VITALS — BP 107/67 | HR 70 | Resp 14 | Ht 67.5 in | Wt 167.0 lb

## 2016-12-19 DIAGNOSIS — Z79899 Other long term (current) drug therapy: Secondary | ICD-10-CM | POA: Diagnosis not present

## 2016-12-19 DIAGNOSIS — I10 Essential (primary) hypertension: Secondary | ICD-10-CM | POA: Diagnosis not present

## 2016-12-19 DIAGNOSIS — M47819 Spondylosis without myelopathy or radiculopathy, site unspecified: Secondary | ICD-10-CM

## 2016-12-19 DIAGNOSIS — M457 Ankylosing spondylitis of lumbosacral region: Secondary | ICD-10-CM

## 2016-12-19 DIAGNOSIS — Z91199 Patient's noncompliance with other medical treatment and regimen due to unspecified reason: Secondary | ICD-10-CM

## 2016-12-19 DIAGNOSIS — Z9119 Patient's noncompliance with other medical treatment and regimen: Secondary | ICD-10-CM

## 2016-12-19 DIAGNOSIS — Z8709 Personal history of other diseases of the respiratory system: Secondary | ICD-10-CM

## 2016-12-19 DIAGNOSIS — M19042 Primary osteoarthritis, left hand: Secondary | ICD-10-CM

## 2016-12-19 DIAGNOSIS — F17200 Nicotine dependence, unspecified, uncomplicated: Secondary | ICD-10-CM | POA: Diagnosis not present

## 2016-12-19 DIAGNOSIS — M4690 Unspecified inflammatory spondylopathy, site unspecified: Secondary | ICD-10-CM

## 2016-12-19 DIAGNOSIS — H209 Unspecified iridocyclitis: Secondary | ICD-10-CM

## 2016-12-19 DIAGNOSIS — Z8659 Personal history of other mental and behavioral disorders: Secondary | ICD-10-CM

## 2016-12-19 DIAGNOSIS — M19041 Primary osteoarthritis, right hand: Secondary | ICD-10-CM | POA: Diagnosis not present

## 2016-12-19 NOTE — Telephone Encounter (Signed)
A prior authorization for Humira was submitted via cover my meds. Will update once we receive a response.  Lailany Enoch, [place], CPhT 3:16 PM

## 2016-12-19 NOTE — Patient Instructions (Signed)
Adalimumab Injection What is this medicine? ADALIMUMAB (a dal AYE mu mab) is used to treat rheumatoid and psoriatic arthritis. It is also used to treat ankylosing spondylitis, Crohn's disease, ulcerative colitis, plaque psoriasis, hidradenitis suppurativa, and uveitis. This medicine may be used for other purposes; ask your health care provider or pharmacist if you have questions. COMMON BRAND NAME(S): CYLTEZO, Humira What should I tell my health care provider before I take this medicine? They need to know if you have any of these conditions: -diabetes -heart disease -hepatitis B or history of hepatitis B infection -immune system problems -infection or history of infections -multiple sclerosis -recently received or scheduled to receive a vaccine -scheduled to have surgery -tuberculosis, a positive skin test for tuberculosis or have recently been in close contact with someone who has tuberculosis -an unusual reaction to adalimumab, other medicines, mannitol, latex, rubber, foods, dyes, or preservatives -pregnant or trying to get pregnant -breast-feeding How should I use this medicine? This medicine is for injection under the skin. You will be taught how to prepare and give this medicine. Use exactly as directed. Take your medicine at regular intervals. Do not take your medicine more often than directed. A special MedGuide will be given to you by the pharmacist with each prescription and refill. Be sure to read this information carefully each time. It is important that you put your used needles and syringes in a special sharps container. Do not put them in a trash can. If you do not have a sharps container, call your pharmacist or healthcare provider to get one. Talk to your pediatrician regarding the use of this medicine in children. While this drug may be prescribed for children as young as 2 years for selected conditions, precautions do apply. The manufacturer of the medicine offers free  information to patients and their health care partners. Call 1-800-448-6472 for more information. Overdosage: If you think you have taken too much of this medicine contact a poison control center or emergency room at once. NOTE: This medicine is only for you. Do not share this medicine with others. What if I miss a dose? If you miss a dose, take it as soon as you can. If it is almost time for your next dose, take only that dose. Do not take double or extra doses. Give the next dose when your next scheduled dose is due. Call your doctor or health care professional if you are not sure how to handle a missed dose. What may interact with this medicine? Do not take this medicine with any of the following medications: -abatacept -anakinra -etanercept -infliximab -live virus vaccines -rilonacept This medicine may also interact with the following medications: -vaccines This list may not describe all possible interactions. Give your health care provider a list of all the medicines, herbs, non-prescription drugs, or dietary supplements you use. Also tell them if you smoke, drink alcohol, or use illegal drugs. Some items may interact with your medicine. What should I watch for while using this medicine? Visit your doctor or health care professional for regular checks on your progress. Tell your doctor or healthcare professional if your symptoms do not start to get better or if they get worse. You will be tested for tuberculosis (TB) before you start this medicine. If your doctor prescribes any medicine for TB, you should start taking the TB medicine before starting this medicine. Make sure to finish the full course of TB medicine. Call your doctor or health care professional if you get a cold   or other infection while receiving this medicine. Do not treat yourself. This medicine may decrease your body's ability to fight infection. Talk to your doctor about your risk of cancer. You may be more at risk for  certain types of cancers if you take this medicine. What side effects may I notice from receiving this medicine? Side effects that you should report to your doctor or health care professional as soon as possible: -allergic reactions like skin rash, itching or hives, swelling of the face, lips, or tongue -breathing problems -changes in vision -chest pain -fever, chills, or any other sign of infection -numbness or tingling -red, scaly patches or raised bumps on the skin -swelling of the ankles -swollen lymph nodes in the neck, underarm, or groin areas -unexplained weight loss -unusual bleeding or bruising -unusually weak or tired Side effects that usually do not require medical attention (report to your doctor or health care professional if they continue or are bothersome): -headache -nausea -redness, itching, swelling, or bruising at site where injected This list may not describe all possible side effects. Call your doctor for medical advice about side effects. You may report side effects to FDA at 1-800-FDA-1088. Where should I keep my medicine? Keep out of the reach of children. Store in the original container and in the refrigerator between 2 and 8 degrees C (36 and 46 degrees F). Do not freeze. The product may be stored in a cool carrier with an ice pack, if needed. Protect from light. Throw away any unused medicine after the expiration date. NOTE: This sheet is a summary. It may not cover all possible information. If you have questions about this medicine, talk to your doctor, pharmacist, or health care provider.  2018 Elsevier/Gold Standard (2015-01-18 11:11:43)  

## 2016-12-20 ENCOUNTER — Telehealth: Payer: Self-pay | Admitting: Rheumatology

## 2016-12-20 DIAGNOSIS — R300 Dysuria: Secondary | ICD-10-CM | POA: Diagnosis not present

## 2016-12-20 DIAGNOSIS — F411 Generalized anxiety disorder: Secondary | ICD-10-CM | POA: Diagnosis not present

## 2016-12-20 DIAGNOSIS — N760 Acute vaginitis: Secondary | ICD-10-CM | POA: Diagnosis not present

## 2016-12-20 LAB — IGG, IGA, IGM
IGG (IMMUNOGLOBIN G), SERUM: 841 mg/dL (ref 694–1618)
IGM, SERUM: 370 mg/dL — AB (ref 48–271)
IgA: 199 mg/dL (ref 81–463)

## 2016-12-20 LAB — HIV ANTIBODY (ROUTINE TESTING W REFLEX): HIV: NONREACTIVE

## 2016-12-20 NOTE — Telephone Encounter (Signed)
Patient left a message yesterday stating that she was referred to [facility] and is unable to get in see them until July 25th, she is wanting to know if she should go to a different dermatologist.  CB#[phone].  Thank you.

## 2016-12-20 NOTE — Telephone Encounter (Signed)
I called [facility] appt rescheduled to 01/21/17 at 8:40, left message for patient and advised patient is that didn't work to call back and I will send referral elsewhere.

## 2016-12-21 LAB — QUANTIFERON TB GOLD ASSAY (BLOOD)
INTERFERON GAMMA RELEASE ASSAY: NEGATIVE
Mitogen-Nil: 7.89 IU/mL
Quantiferon Nil Value: 0.09 IU/mL
Quantiferon Tb Ag Minus Nil Value: <0 IU/mL

## 2016-12-23 NOTE — Telephone Encounter (Signed)
Received a fax from [facility] regarding a prior authorization approval for [place] from 10/13/16 to 12/19/2019.   Reference number: none Phone number:[phone]  Will send document to scan center.  Naureen Benton, [place], CPhT  11:15 AM

## 2016-12-23 NOTE — Telephone Encounter (Signed)
I called patient to inform her that Humira was approved.  Discussed copay amount for prescription.  Patient reports she is unable to afford copay and she is not eligible for copay card due to [employer].  Discussed applying for Humira patient assistance program.  Patient is interested.  I advised her to get a copy of her income information and come by the office to complete application.  Can you print application for patient?

## 2016-12-24 DIAGNOSIS — F329 Major depressive disorder, single episode, unspecified: Secondary | ICD-10-CM | POA: Diagnosis not present

## 2016-12-24 NOTE — Progress Notes (Signed)
WNL

## 2016-12-24 NOTE — Telephone Encounter (Signed)
Patient came by the office to complete patient assistance application.  Reviewed patient's income.  She currently gets 100% partial extra help but has to pay 15% copay toward her medication and therefor Humira is unaffordable.  Assisted patient in completing Humira patient assistance application.  Provider portion was signed by Dr. Estanislado Pandy.  Application was faxed to [facility].  Patient reports she stopped working in 2017.  Based on current income, she should qualify for Full Extra Help.  Assisted patient in re-applying or extra help to see if she qualified for Full Extra Help.  Advised patient that if she qualifies for Full Extra Help, medication copays will decrease to $3.35 for generic medications and $8.35 for brand name medications.  If she qualifies for Full Extra Help, she will not need Humira patient assistance.  Patient voiced understanding.   Elisabeth Most, Pharm.D., BCPS, CPP Clinical Pharmacist Pager: [phone] Phone: [phone] 12/24/2016 2:22 PM

## 2016-12-25 ENCOUNTER — Telehealth: Payer: Self-pay | Admitting: Radiology

## 2016-12-25 NOTE — Telephone Encounter (Signed)
I have called patient to advise labs are normal  

## 2016-12-25 NOTE — Telephone Encounter (Signed)
-----   Message from Bo Merino, MD sent at 12/24/2016  1:02 PM EDT ----- WNL

## 2016-12-27 DIAGNOSIS — Z23 Encounter for immunization: Secondary | ICD-10-CM | POA: Diagnosis not present

## 2016-12-30 ENCOUNTER — Telehealth: Payer: Self-pay | Admitting: Rheumatology

## 2016-12-30 NOTE — Telephone Encounter (Signed)
I spoke to patient who states she received a call from [place] patient assistance program stating they received her application, but they were unable to read portions of it.  Will resubmit patient's application.  Can you reach out to [employer] to see if any further information is needed?

## 2016-12-30 NOTE — Telephone Encounter (Signed)
Patient would like to talk to [facility] regarding the application for her Humira.  CB#[phone].  Thank you.

## 2016-12-30 NOTE — Telephone Encounter (Signed)
Spoke to [place], a representative, who states that the patient is missing the date beside her signature as well as not being about to see monthly total income, the total number of people in her household and the number or people in her household over 69 years old with income.   Spoke to patient to give her an update. She has the phone number and will give them a call. Will check back with foundation and update once we receive a response.   Mykaila Blunck, [place], CPhT 11:07 AM

## 2017-01-07 ENCOUNTER — Telehealth: Payer: Self-pay

## 2017-01-07 NOTE — Telephone Encounter (Signed)
Called [employer] Patient Assistance to verify status of patient's application for HUMIRA. Spoke with Melissa who states that the patient has been approved through 07/14/2017. The Rx has been sent to the pharmacy and the patient should receive a call soon to set up her first delivery.   They will fax a copy of the approval letter to the clinic. Will send document to scan center once we receive it.   Emelly Wurtz, [place], CPhT  10:10 AM

## 2017-01-07 NOTE — Telephone Encounter (Signed)
She should see dermatology next available does not have to be baseline. Okay to start Humira after the tooth extraction. She should discontinue CellCept prior to starting Humira.

## 2017-01-07 NOTE — Telephone Encounter (Signed)
I informed patient she has been approved for Humira patient assistance program.  Reviewed her chart and noted that Dr. Estanislado Pandy wanted patient to have repeat chest x-ray before starting Humira.  Patient confirms she will go have chest x-ray tomorrow.    She reports she is also having a tooth pulled on Thusday and will be on 7 days of antibiotic.  Also it looks like there was mention of needing a baseline dermatology exam before starting Humira.  Dermatology exam is not scheduled until 01/21/17.    Do you want her to wait until after that visit before starting Humira?   Elisabeth Most, Pharm.D., BCPS, CPP Clinical Pharmacist Pager: [phone] Phone: [phone] 01/07/2017 3:02 PM

## 2017-01-08 ENCOUNTER — Ambulatory Visit (HOSPITAL_COMMUNITY)
Admission: RE | Admit: 2017-01-08 | Discharge: 2017-01-08 | Disposition: A | Discharge status: Home / Self Care | Payer: Medicare Other | Source: Ambulatory Visit | Attending: Rheumatology | Admitting: Rheumatology

## 2017-01-08 DIAGNOSIS — Z5181 Encounter for therapeutic drug level monitoring: Secondary | ICD-10-CM | POA: Diagnosis not present

## 2017-01-08 DIAGNOSIS — Z79899 Other long term (current) drug therapy: Secondary | ICD-10-CM | POA: Insufficient documentation

## 2017-01-08 DIAGNOSIS — J209 Acute bronchitis, unspecified: Secondary | ICD-10-CM | POA: Diagnosis not present

## 2017-01-08 NOTE — Telephone Encounter (Signed)
I spoke to patient.  She did not start CellCept since decision was made to start Humira instead.  She wants to wait until after tooth is pulled and she has finished antibiotic before starting the medication.  She has visit with Dr. Estanislado Pandy on 01/16/17 and wants to discuss starting medication at that time.

## 2017-01-08 NOTE — Progress Notes (Signed)
CXR normal.

## 2017-01-14 DIAGNOSIS — H35371 Puckering of macula, right eye: Secondary | ICD-10-CM | POA: Diagnosis not present

## 2017-01-14 DIAGNOSIS — H209 Unspecified iridocyclitis: Secondary | ICD-10-CM | POA: Diagnosis not present

## 2017-01-14 DIAGNOSIS — Z961 Presence of intraocular lens: Secondary | ICD-10-CM | POA: Diagnosis not present

## 2017-01-14 DIAGNOSIS — H35033 Hypertensive retinopathy, bilateral: Secondary | ICD-10-CM | POA: Diagnosis not present

## 2017-01-14 DIAGNOSIS — H0233 Blepharochalasis right eye, unspecified eyelid: Secondary | ICD-10-CM | POA: Diagnosis not present

## 2017-01-15 NOTE — Progress Notes (Signed)
Office Visit Note  Patient: Kirsten Harper             Date of Birth: 11-13-1945           MRN: [id number]             PCP: Kelton Pillar, MD Referring: Kelton Pillar, MD Visit Date: 01/16/2017 Occupation: '@GUAROCC'$ @    Subjective:  Uveitis   History of Present Illness: Kirsten Harper is a 71 y.o. female  with history of ankylosing spondylitis and uveitis. She could not to start Humira as she was having some dental work. She states she had to be on amoxicillin for tooth extraction. She states she developed yeast infection after the course of amoxicillin. She was treated with Diflucan prior to amoxicillin and now again after the course of amoxicillin. She's not going to have any further dental work. She states her vision is a still cloudy. She's having some urgency and she's concerned that she may have urinary tract infection. She denies any fever. She saw her ophthalmologist last week on Tuesday. She states that he advised her to stay on the eyedrops for right now  Activities of Daily Living:  Patient reports morning stiffness for 0 minute.   Patient Denies nocturnal pain.  Difficulty dressing/grooming: Denies Difficulty climbing stairs: Reports Difficulty getting out of chair: Reports Difficulty using hands for taps, buttons, cutlery, and/or writing: Denies   Review of Systems  Constitutional: Positive for fatigue. Negative for night sweats, weight gain, weight loss and weakness.  HENT: Negative for mouth sores, trouble swallowing, trouble swallowing, mouth dryness and nose dryness.   Eyes: Positive for visual disturbance. Negative for pain, redness and dryness.  Respiratory: Negative for cough, shortness of breath and difficulty breathing.   Cardiovascular: Negative for chest pain, palpitations, hypertension, irregular heartbeat and swelling in legs/feet.  Gastrointestinal: Positive for diarrhea. Negative for blood in stool and constipation.       Due to antibiotics  Endocrine:  Negative for increased urination.  Genitourinary: Negative for vaginal dryness.  Musculoskeletal: Positive for arthralgias and joint pain. Negative for joint swelling, myalgias, muscle weakness, muscle tenderness and myalgias.       Lower back pain  Skin: Negative for color change, rash, hair loss, skin tightness, ulcers and sensitivity to sunlight.  Allergic/Immunologic: Negative for susceptible to infections.  Neurological: Negative for dizziness, memory loss and night sweats.  Hematological: Negative for swollen glands.  Psychiatric/Behavioral: Negative for depressed mood and sleep disturbance. The patient is nervous/anxious.     PMFS History:  Patient Active Problem List   Diagnosis Date Noted  . Benzodiazepine dependence in remission ([place]) 10/13/2016  . Community acquired pneumonia of right lower lobe of lung ([place]) 09/24/2016  . Ankylosing spondylitis of lumbosacral region ([place]) 09/24/2016  . Major depressive disorder, recurrent severe without psychotic features ([place]) 08/27/2016  . GAD (generalized anxiety disorder) 08/27/2016  . 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 Spondyloarthropathy HLA-B27 positive 06/06/2016  . Iritis 06/06/2016  . High risk medication use 06/06/2016  . Primary osteoarthritis of both hands 06/06/2016  . Smoker 06/06/2016  . Acute respiratory failure with hypoxia ([place]) 04/10/2016  . COPD exacerbation ([place]) 04/10/2016  . Anxiety 04/10/2016  . Essential hypertension     Past Medical History:  Diagnosis Date  . Ankylosing spondylitis ([place])   . Anxiety   . Arthritis   . Chronic lower back pain   . CKD (chronic kidney disease), stage II    "related to the  Ankylosing spondylitis" (04/10/2016)  . COPD (chronic obstructive pulmonary disease) (H[facility])   . Eye disease   . Hypertension   . PONV (postoperative nausea and vomiting)    "didn't bother me the last time I had it in ~ 2015"    Family History  Problem Relation Age of Onset  .  Hypertension Mother   . Heart disease Sister   . Diabetes Sister   . Pulmonary fibrosis Brother   . Alzheimer's disease Brother    Past Surgical History:  Procedure Laterality Date  . BROW LIFT AND BLEPHAROPLASTY Bilateral ~ 2015  . CATARACT EXTRACTION W/ INTRAOCULAR LENS  IMPLANT, BILATERAL Bilateral 2000s  . MANDIBLE SURGERY  ~ 2000   "jaw had moved to left; broke my jaw in 5 places; put titanium in; stripped muscle from bone; wired jaw shut"  . TUBAL LIGATION  1979  . VAGINAL HYSTERECTOMY  1985   Social History   Social History Narrative  . No narrative on file     Objective: Vital Signs: BP 130/72   Pulse 80   Resp 14   Wt 170 lb (77.1 kg)   BMI 26.23 kg/m    Physical Exam  Constitutional: She is oriented to person, place, and time. She appears well-developed and well-nourished.  HENT:  Head: Normocephalic and atraumatic.  Eyes: Conjunctivae and EOM are normal.  Neck: Normal range of motion.  Cardiovascular: Normal rate, regular rhythm, normal heart sounds and intact distal pulses.   Pulmonary/Chest: Effort normal and breath sounds normal.  Abdominal: Soft. Bowel sounds are normal.  Lymphadenopathy:    She has no cervical adenopathy.  Neurological: She is alert and oriented to person, place, and time.  Skin: Skin is warm and dry. Capillary refill takes less than 2 seconds.  Psychiatric: She has a normal mood and affect. Her behavior is normal.  Nursing note and vitals reviewed.    Musculoskeletal Exam: C-spine and thoracic lumbar spine good range of motion. She is some tenderness on palpation over right SI joint. Shoulder joints elbow joints wrist joint MCPs PIPs DIPs with good range of motion with no synovitis. Hip joints knee joints ankles MTPs PIPs DIPs are good range of motion with no synovitis.  CDAI Exam: No CDAI exam completed.    Investigation: No additional findings.   Imaging: Dg Chest 2 View  Result Date: 01/08/2017 CLINICAL DATA:  Bronchitis  EXAM: CHEST  2 VIEW COMPARISON:  10/13/2016 FINDINGS: There is no focal parenchymal opacity. There is no pleural effusion or pneumothorax. The heart and mediastinal contours are unremarkable. The osseous structures are unremarkable. IMPRESSION: No active cardiopulmonary disease. Electronically Signed   By: Elige Ko   On: 01/08/2017 12:17    Speciality Comments: No specialty comments available.  11/25/2016 CBC normal, CMP normal 12/19/2016 TB gold negative, immunoglobulins normal, HIV-negative  Procedures:  No procedures performed Allergies: Amoxapine and related   Assessment / Plan:     Visit Diagnoses: Ankylosing spondylitis of lumbosacral region (HCC) -  HLA B-27+,History of SI joint sclerosis, knee joint effusion, or erosive changes in hands and feet. Patient has no synovitis on examination today. She continues to have some discomfort in the right SI joint area.  Uveitis - fu by Dr. Gae Bon. She is on eyedrops currently. Elevating to start Humira. I wrote him a note that he marrow was approved so  High risk medication use - Patient wanted to hold off Humira until her tooth extraction . Humira was approved. She is on amoxicillin  currently for tooth extraction. She's also on Diflucan for yeast infection.  Dysuria: Patient is uncertain if she has urinary tract infection or the dysuria is related to yeast infection. She will make appointment with her PCP tomorrow. When she gets her urine results back we can start her on Diflucan. She will contact us after the results.  Primary osteoarthritis of both hands: Chronic pain   History of hypertension  Smoker - former  History of COPD  History of anxiety    Orders: No orders of the defined types were placed in this encounter.  No orders of the defined types were placed in this encounter.   Face-to-face time spent with patient was 30 minutes. 50% of time was spent in counseling and coordination of care.  Follow-Up Instructions:  Return in about 3 months (around 04/18/2017) for Ankylosing spondylitis Uveitis.   Bo Merino, MD  Note - This record has been created using Editor, commissioning.  Chart creation errors have been sought, but may not always  have been located. Such creation errors do not reflect on  the standard of medical care.

## 2017-01-16 ENCOUNTER — Encounter: Payer: Self-pay | Admitting: Rheumatology

## 2017-01-16 ENCOUNTER — Telehealth: Payer: Self-pay | Admitting: Radiology

## 2017-01-16 ENCOUNTER — Telehealth: Payer: Self-pay | Admitting: Rheumatology

## 2017-01-16 ENCOUNTER — Ambulatory Visit (INDEPENDENT_AMBULATORY_CARE_PROVIDER_SITE_OTHER): Payer: Medicare Other | Admitting: Rheumatology

## 2017-01-16 VITALS — BP 130/72 | HR 80 | Resp 14 | Wt 170.0 lb

## 2017-01-16 DIAGNOSIS — F17200 Nicotine dependence, unspecified, uncomplicated: Secondary | ICD-10-CM | POA: Diagnosis not present

## 2017-01-16 DIAGNOSIS — Z8659 Personal history of other mental and behavioral disorders: Secondary | ICD-10-CM | POA: Diagnosis not present

## 2017-01-16 DIAGNOSIS — Z8709 Personal history of other diseases of the respiratory system: Secondary | ICD-10-CM

## 2017-01-16 DIAGNOSIS — Z79899 Other long term (current) drug therapy: Secondary | ICD-10-CM

## 2017-01-16 DIAGNOSIS — M19042 Primary osteoarthritis, left hand: Secondary | ICD-10-CM

## 2017-01-16 DIAGNOSIS — H209 Unspecified iridocyclitis: Secondary | ICD-10-CM | POA: Diagnosis not present

## 2017-01-16 DIAGNOSIS — M19041 Primary osteoarthritis, right hand: Secondary | ICD-10-CM | POA: Diagnosis not present

## 2017-01-16 DIAGNOSIS — M457 Ankylosing spondylitis of lumbosacral region: Secondary | ICD-10-CM | POA: Diagnosis not present

## 2017-01-16 DIAGNOSIS — Z8679 Personal history of other diseases of the circulatory system: Secondary | ICD-10-CM | POA: Diagnosis not present

## 2017-01-16 NOTE — Telephone Encounter (Signed)
Patient states she is on antibiotics after having a tooth pulled. Patient advised she should not be on Humira while on antibiotics. Patient states she also feels like she may have a UTI. Patient advised she should have that checked by her PCP. Patient states her PCP is out of the office for the week and would like to know if we can check that when she comes in for her appointment today @ 3:00 pm.

## 2017-01-16 NOTE — Telephone Encounter (Signed)
Humira consent signed today she was previously given information. Have given again today, she would like the Humira to ship here for administration, from [place].   We can go ahead and start as a sample per Dr Estanislado Pandy, due to her eye situation.   Jonna Coup will contact [facility] to change delivery to here instead of her home

## 2017-01-16 NOTE — Telephone Encounter (Signed)
Please call patient Monday in reference to scheduling a nurse visit for Kirsten Harper injection.

## 2017-01-16 NOTE — Telephone Encounter (Signed)
Patient is currently on antibiotics and is wondering if she should post pone her Humira injection until she is done with the antibiotics.  Thank you.

## 2017-01-16 NOTE — Patient Instructions (Signed)
Adalimumab Injection What is this medicine? ADALIMUMAB (a dal AYE mu mab) is used to treat rheumatoid and psoriatic arthritis. It is also used to treat ankylosing spondylitis, Crohn's disease, ulcerative colitis, plaque psoriasis, hidradenitis suppurativa, and uveitis. This medicine may be used for other purposes; ask your health care provider or pharmacist if you have questions. COMMON BRAND NAME(S): CYLTEZO, Humira What should I tell my health care provider before I take this medicine? They need to know if you have any of these conditions: -diabetes -heart disease -hepatitis B or history of hepatitis B infection -immune system problems -infection or history of infections -multiple sclerosis -recently received or scheduled to receive a vaccine -scheduled to have surgery -tuberculosis, a positive skin test for tuberculosis or have recently been in close contact with someone who has tuberculosis -an unusual reaction to adalimumab, other medicines, mannitol, latex, rubber, foods, dyes, or preservatives -pregnant or trying to get pregnant -breast-feeding How should I use this medicine? This medicine is for injection under the skin. You will be taught how to prepare and give this medicine. Use exactly as directed. Take your medicine at regular intervals. Do not take your medicine more often than directed. A special MedGuide will be given to you by the pharmacist with each prescription and refill. Be sure to read this information carefully each time. It is important that you put your used needles and syringes in a special sharps container. Do not put them in a trash can. If you do not have a sharps container, call your pharmacist or healthcare provider to get one. Talk to your pediatrician regarding the use of this medicine in children. While this drug may be prescribed for children as young as 2 years for selected conditions, precautions do apply. The manufacturer of the medicine offers free  information to patients and their health care partners. Call 1-800-448-6472 for more information. Overdosage: If you think you have taken too much of this medicine contact a poison control center or emergency room at once. NOTE: This medicine is only for you. Do not share this medicine with others. What if I miss a dose? If you miss a dose, take it as soon as you can. If it is almost time for your next dose, take only that dose. Do not take double or extra doses. Give the next dose when your next scheduled dose is due. Call your doctor or health care professional if you are not sure how to handle a missed dose. What may interact with this medicine? Do not take this medicine with any of the following medications: -abatacept -anakinra -etanercept -infliximab -live virus vaccines -rilonacept This medicine may also interact with the following medications: -vaccines This list may not describe all possible interactions. Give your health care provider a list of all the medicines, herbs, non-prescription drugs, or dietary supplements you use. Also tell them if you smoke, drink alcohol, or use illegal drugs. Some items may interact with your medicine. What should I watch for while using this medicine? Visit your doctor or health care professional for regular checks on your progress. Tell your doctor or healthcare professional if your symptoms do not start to get better or if they get worse. You will be tested for tuberculosis (TB) before you start this medicine. If your doctor prescribes any medicine for TB, you should start taking the TB medicine before starting this medicine. Make sure to finish the full course of TB medicine. Call your doctor or health care professional if you get a cold   or other infection while receiving this medicine. Do not treat yourself. This medicine may decrease your body's ability to fight infection. Talk to your doctor about your risk of cancer. You may be more at risk for  certain types of cancers if you take this medicine. What side effects may I notice from receiving this medicine? Side effects that you should report to your doctor or health care professional as soon as possible: -allergic reactions like skin rash, itching or hives, swelling of the face, lips, or tongue -breathing problems -changes in vision -chest pain -fever, chills, or any other sign of infection -numbness or tingling -red, scaly patches or raised bumps on the skin -swelling of the ankles -swollen lymph nodes in the neck, underarm, or groin areas -unexplained weight loss -unusual bleeding or bruising -unusually weak or tired Side effects that usually do not require medical attention (report to your doctor or health care professional if they continue or are bothersome): -headache -nausea -redness, itching, swelling, or bruising at site where injected This list may not describe all possible side effects. Call your doctor for medical advice about side effects. You may report side effects to FDA at 1-800-FDA-1088. Where should I keep my medicine? Keep out of the reach of children. Store in the original container and in the refrigerator between 2 and 8 degrees C (36 and 46 degrees F). Do not freeze. The product may be stored in a cool carrier with an ice pack, if needed. Protect from light. Throw away any unused medicine after the expiration date. NOTE: This sheet is a summary. It may not cover all possible information. If you have questions about this medicine, talk to your doctor, pharmacist, or health care provider.  2018 Elsevier/Gold Standard (2015-01-18 11:11:43)  

## 2017-01-20 ENCOUNTER — Telehealth: Payer: Self-pay | Admitting: Rheumatology

## 2017-01-20 DIAGNOSIS — L720 Epidermal cyst: Secondary | ICD-10-CM | POA: Diagnosis not present

## 2017-01-20 DIAGNOSIS — L814 Other melanin hyperpigmentation: Secondary | ICD-10-CM | POA: Diagnosis not present

## 2017-01-20 DIAGNOSIS — D1801 Hemangioma of skin and subcutaneous tissue: Secondary | ICD-10-CM | POA: Diagnosis not present

## 2017-01-20 DIAGNOSIS — D225 Melanocytic nevi of trunk: Secondary | ICD-10-CM | POA: Diagnosis not present

## 2017-01-20 DIAGNOSIS — L820 Inflamed seborrheic keratosis: Secondary | ICD-10-CM | POA: Diagnosis not present

## 2017-01-20 DIAGNOSIS — D3617 Benign neoplasm of peripheral nerves and autonomic nervous system of trunk, unspecified: Secondary | ICD-10-CM | POA: Diagnosis not present

## 2017-01-20 NOTE — Telephone Encounter (Signed)
Patient returned your call.

## 2017-01-20 NOTE — Telephone Encounter (Signed)
Attempted to contact the patient and left message for patient to call the office.  

## 2017-01-20 NOTE — Telephone Encounter (Signed)
Patient has been scheduled for a nurse visit on 01/22/17 @ 2 pm to start Humira.

## 2017-01-20 NOTE — Telephone Encounter (Signed)
Will start patient with Humira sample.  Received notification from Seth Bake that patient scheduled for nurse visit on 01/22/17.    Elisabeth Most, Pharm.D., BCPS, CPP Clinical Pharmacist Pager: [phone] Phone: [phone] 01/20/2017 2:51 PM

## 2017-01-20 NOTE — Telephone Encounter (Signed)
See previous phone note.  

## 2017-01-21 DIAGNOSIS — R350 Frequency of micturition: Secondary | ICD-10-CM | POA: Diagnosis not present

## 2017-01-22 ENCOUNTER — Ambulatory Visit: Payer: Medicare Other | Admitting: *Deleted

## 2017-01-22 VITALS — BP 117/66 | HR 78

## 2017-01-22 DIAGNOSIS — M450 Ankylosing spondylitis of multiple sites in spine: Secondary | ICD-10-CM

## 2017-01-22 MED ORDER — ADALIMUMAB 40 MG/0.8ML ~~LOC~~ PSKT
40.0000 mg | PREFILLED_SYRINGE | Freq: Once | SUBCUTANEOUS | Status: AC
  Administered 2017-01-22: 40 mg via SUBCUTANEOUS

## 2017-01-22 NOTE — Patient Instructions (Addendum)
Your Humira will be mailed to your from the [facility] Patient Assistance Program.  It is scheduled to be delivered on July 18th, 2018.  The pharmacy's phone number is [phone].  Standing Labs We placed an order today for your standing lab work.    Please come back and get your standing labs in 1 month then every 3 months  We have open lab Monday through Friday from 8:30-11:30 AM and 1:30-4 PM at the office of Dr. Bo Merino.   The office is located at [address] [place], [place], [place] [address] No appointment is necessary.   Labs are drawn by [facility].  You may receive a bill from [facility] for your lab work. If you have any questions regarding directions or hours of operation,  please call [phone].    Adalimumab Injection What is this medicine? ADALIMUMAB (a dal AYE mu mab) is used to treat rheumatoid and psoriatic arthritis. It is also used to treat ankylosing spondylitis, Crohn's disease, ulcerative colitis, plaque psoriasis, hidradenitis suppurativa, and uveitis. This medicine may be used for other purposes; ask your health care provider or pharmacist if you have questions. COMMON BRAND NAME(S): CYLTEZO, Humira What should I tell my health care provider before I take this medicine? They need to know if you have any of these conditions: -diabetes -heart disease -hepatitis B or history of hepatitis B infection -immune system problems -infection or history of infections -multiple sclerosis -recently received or scheduled to receive a vaccine -scheduled to have surgery -tuberculosis, a positive skin test for tuberculosis or have recently been in close contact with someone who has tuberculosis -an unusual reaction to adalimumab, other medicines, mannitol, latex, rubber, foods, dyes, or preservatives -pregnant or trying to get pregnant -breast-feeding How should I use this medicine? This medicine is for injection under the skin. You will be taught how to prepare and  give this medicine. Use exactly as directed. Take your medicine at regular intervals. Do not take your medicine more often than directed. A special MedGuide will be given to you by the pharmacist with each prescription and refill. Be sure to read this information carefully each time. It is important that you put your used needles and syringes in a special sharps container. Do not put them in a trash can. If you do not have a sharps container, call your pharmacist or healthcare provider to get one. Talk to your pediatrician regarding the use of this medicine in children. While this drug may be prescribed for children as young as 2 years for selected conditions, precautions do apply. The manufacturer of the medicine offers free information to patients and their health care partners. Call 564-242-4178 for more information. Overdosage: If you think you have taken too much of this medicine contact a poison control center or emergency room at once. NOTE: This medicine is only for you. Do not share this medicine with others. What if I miss a dose? If you miss a dose, take it as soon as you can. If it is almost time for your next dose, take only that dose. Do not take double or extra doses. Give the next dose when your next scheduled dose is due. Call your doctor or health care professional if you are not sure how to handle a missed dose. What may interact with this medicine? Do not take this medicine with any of the following medications: -abatacept -anakinra -etanercept -infliximab -live virus vaccines -rilonacept This medicine may also interact with the following medications: -vaccines This list  may not describe all possible interactions. Give your health care provider a list of all the medicines, herbs, non-prescription drugs, or dietary supplements you use. Also tell them if you smoke, drink alcohol, or use illegal drugs. Some items may interact with your medicine. What should I watch for while  using this medicine? Visit your doctor or health care professional for regular checks on your progress. Tell your doctor or healthcare professional if your symptoms do not start to get better or if they get worse. You will be tested for tuberculosis (TB) before you start this medicine. If your doctor prescribes any medicine for TB, you should start taking the TB medicine before starting this medicine. Make sure to finish the full course of TB medicine. Call your doctor or health care professional if you get a cold or other infection while receiving this medicine. Do not treat yourself. This medicine may decrease your body's ability to fight infection. Talk to your doctor about your risk of cancer. You may be more at risk for certain types of cancers if you take this medicine. What side effects may I notice from receiving this medicine? Side effects that you should report to your doctor or health care professional as soon as possible: -allergic reactions like skin rash, itching or hives, swelling of the face, lips, or tongue -breathing problems -changes in vision -chest pain -fever, chills, or any other sign of infection -numbness or tingling -red, scaly patches or raised bumps on the skin -swelling of the ankles -swollen lymph nodes in the neck, underarm, or groin areas -unexplained weight loss -unusual bleeding or bruising -unusually weak or tired Side effects that usually do not require medical attention (report to your doctor or health care professional if they continue or are bothersome): -headache -nausea -redness, itching, swelling, or bruising at site where injected This list may not describe all possible side effects. Call your doctor for medical advice about side effects. You may report side effects to FDA at 1-800-FDA-1088. Where should I keep my medicine? Keep out of the reach of children. Store in the original container and in the refrigerator between 2 and 8 degrees C (36 and  46 degrees F). Do not freeze. The product may be stored in a cool carrier with an ice pack, if needed. Protect from light. Throw away any unused medicine after the expiration date. NOTE: This sheet is a summary. It may not cover all possible information. If you have questions about this medicine, talk to your doctor, pharmacist, or health care provider.  2018 Elsevier/Gold Standard (2015-01-18 11:11:43)

## 2017-01-22 NOTE — Progress Notes (Signed)
Patient in office for initial start to Humira. Patient provided medication. Patient was given medication in her left thigh. Patient tolerated injection well. Patient was monitored in office for 30 minutes after administration for adverse reactions. No adverse reactions noted.   Administrations This Visit    Adalimumab PSKT 40 mg    Admin Date 01/22/2017 Action Given Dose 40 mg Route Subcutaneous Administered By Carole Binning, LPN

## 2017-01-22 NOTE — Progress Notes (Signed)
Pharmacy Note  Subjective:   Patient presents for initiation of Humira.  Patient was previously counseled extensively on Humira by Dr. Estanislado Pandy on 12/19/16 and consented to initiation of Humira at that time.  Patient presents to clinic today to receive the first dose of Humira.  She denies any active infection.       Objective: CMP     Component Value Date/Time   NA 141 11/25/2016 0923   K 4.5 11/25/2016 0923   CL 104 11/25/2016 0923   CO2 28 11/25/2016 0923   GLUCOSE 113 (H) 11/25/2016 0923   BUN 13 11/25/2016 0923   CREATININE 0.92 11/25/2016 0923   CALCIUM 9.7 11/25/2016 0923   PROT 6.8 11/25/2016 0923   ALBUMIN 4.3 11/25/2016 0923   AST 12 11/25/2016 0923   ALT 12 11/25/2016 0923   ALKPHOS 52 11/25/2016 0923   BILITOT 1.1 11/25/2016 0923   GFRNONAA 64 11/25/2016 0923   GFRAA 73 11/25/2016 0923   CBC    Component Value Date/Time   WBC 7.0 11/25/2016 0923   RBC 4.69 11/25/2016 0923   HGB 14.3 11/25/2016 0923   HCT 42.5 11/25/2016 0923   PLT 250 11/25/2016 0923   MCV 90.6 11/25/2016 0923   MCV 91.8 07/20/2014 1649   MCH 30.5 11/25/2016 0923   MCHC 33.6 11/25/2016 0923   RDW 14.0 11/25/2016 0923   LYMPHSABS 1,540 11/25/2016 0923   MONOABS 560 11/25/2016 0923   EOSABS 280 11/25/2016 0923   BASOSABS 0 11/25/2016 0923   TB Gold: negative (12/19/16)  Assessment/Plan:  Patient was counseled on how to administer subcutaneous Humira injection using a demonstration pen.  Patient received first dose of Humira.  The medication was administered in the left thigh.  Lot: [id number], Exp. 05/2018.  Patient was monitored for 30 minutes post injection.  No injection site reaction noted.  Patient will need standing lab orders in one month.  Provided patient with standing lab instructions and placed standing lab order.    I assisted patient in calling [facility] patient assistance program to schedule her initial fill of Humira.  I was informed that prescription information was missing from  patient's application.  I spoke to Randall Hiss, pharmacist, to confirm prescription quantity of 6 for 80 day supply, zero refills.  I spoke to [place] to set up initial fill of Humira to be delivered to patient on 01/29/17.    Patient is scheduled for follow up on 03/27/17.    Elisabeth Most, Pharm.D., BCPS Clinical Pharmacist Pager: [phone] Phone: [phone] 01/22/2017 2:18 PM

## 2017-01-23 ENCOUNTER — Telehealth: Payer: Self-pay | Admitting: Radiology

## 2017-01-23 DIAGNOSIS — F329 Major depressive disorder, single episode, unspecified: Secondary | ICD-10-CM | POA: Diagnosis not present

## 2017-01-23 NOTE — Telephone Encounter (Signed)
I have called patient to advise CXR is normal

## 2017-01-23 NOTE — Telephone Encounter (Signed)
-----   Message from Bo Merino, MD sent at 01/08/2017 12:40 PM EDT ----- CXR normal

## 2017-02-04 ENCOUNTER — Telehealth: Payer: Self-pay | Admitting: *Deleted

## 2017-02-04 NOTE — Telephone Encounter (Signed)
Patient is due to have a root canal done. Patient has recently restarted Humira. Patient has been advised to not take her Humira 2 weeks prior to her procedure. Patient verbalized understanding.

## 2017-03-03 ENCOUNTER — Other Ambulatory Visit: Payer: Self-pay | Admitting: Radiology

## 2017-03-03 ENCOUNTER — Telehealth: Payer: Self-pay | Admitting: Rheumatology

## 2017-03-03 DIAGNOSIS — Z79899 Other long term (current) drug therapy: Secondary | ICD-10-CM | POA: Diagnosis not present

## 2017-03-03 LAB — CBC WITH DIFFERENTIAL/PLATELET
BASOS ABS: 0 {cells}/uL (ref 0–200)
Basophils Relative: 0 %
EOS PCT: 4 %
Eosinophils Absolute: 284 cells/uL (ref 15–500)
HCT: 43.7 % (ref 35.0–45.0)
Hemoglobin: 14.9 g/dL (ref 11.7–15.5)
Lymphocytes Relative: 26 %
Lymphs Abs: 1846 cells/uL (ref 850–3900)
MCH: 31.5 pg (ref 27.0–33.0)
MCHC: 34.1 g/dL (ref 32.0–36.0)
MCV: 92.4 fL (ref 80.0–100.0)
MONOS PCT: 7 %
MPV: 9.4 fL (ref 7.5–12.5)
Monocytes Absolute: 497 cells/uL (ref 200–950)
NEUTROS ABS: 4473 {cells}/uL (ref 1500–7800)
Neutrophils Relative %: 63 %
PLATELETS: 225 10*3/uL (ref 140–400)
RBC: 4.73 MIL/uL (ref 3.80–5.10)
RDW: 13.1 % (ref 11.0–15.0)
WBC: 7.1 10*3/uL (ref 3.8–10.8)

## 2017-03-03 NOTE — Telephone Encounter (Signed)
Patient experiencing restless leg syndrome at night. Muscle spasms, muscle twitching, unable to sleep. Patient request something to help. Patient uses [facility]. Please call to advise.

## 2017-03-03 NOTE — Telephone Encounter (Signed)
I have called patient to advise her to discuss her symptoms she thinks are restless leg syndrome  with her Primary care doctor. She states she will, to you [facility]

## 2017-03-04 ENCOUNTER — Telehealth: Payer: Self-pay | Admitting: Rheumatology

## 2017-03-04 LAB — COMPLETE METABOLIC PANEL WITH GFR
ALT: 17 U/L (ref 6–29)
AST: 17 U/L (ref 10–35)
Albumin: 4.5 g/dL (ref 3.6–5.1)
Alkaline Phosphatase: 74 U/L (ref 33–130)
BUN: 18 mg/dL (ref 7–25)
CO2: 27 mmol/L (ref 20–32)
Calcium: 10.6 mg/dL — ABNORMAL HIGH (ref 8.6–10.4)
Chloride: 104 mmol/L (ref 98–110)
Creat: 0.87 mg/dL (ref 0.50–0.99)
GFR, EST NON AFRICAN AMERICAN: 68 mL/min (ref >=60)
GFR, Est African American: 79 mL/min (ref >=60)
GLUCOSE: 104 mg/dL — AB (ref 65–99)
POTASSIUM: 4.3 mmol/L (ref 3.5–5.3)
SODIUM: 142 mmol/L (ref 135–146)
TOTAL PROTEIN: 7 g/dL (ref 6.1–8.1)
Total Bilirubin: 1.1 mg/dL (ref 0.2–1.2)

## 2017-03-04 NOTE — Progress Notes (Signed)
Labs are normal.

## 2017-03-04 NOTE — Telephone Encounter (Signed)
Patient has taken Humira four times now. For two days now she has been feeling weak, nauseous, and having chills, with a low grade fever. Patient took [place] last week, due tomorrow for dose. Please call patient to advise.

## 2017-03-04 NOTE — Telephone Encounter (Signed)
I spoke to patient.  She reports low grade fever (99.5).  Discussed fever is usually defined at >100.4.  She also reports nausea in the morning.  Patient reports she is not due for Humira until next week.  Patient was advised to see her primary care provider.  If she does have fever, she should hold Humira, but if symptoms resolve, she can continue Humira.  Patient voiced understanding.   Elisabeth Most, Pharm.D., BCPS, CPP Clinical Pharmacist Pager: [phone] Phone: [phone] 03/04/2017 4:56 PM

## 2017-03-11 DIAGNOSIS — H35371 Puckering of macula, right eye: Secondary | ICD-10-CM | POA: Diagnosis not present

## 2017-03-11 DIAGNOSIS — H0233 Blepharochalasis right eye, unspecified eyelid: Secondary | ICD-10-CM | POA: Diagnosis not present

## 2017-03-11 DIAGNOSIS — Z961 Presence of intraocular lens: Secondary | ICD-10-CM | POA: Diagnosis not present

## 2017-03-11 DIAGNOSIS — H35033 Hypertensive retinopathy, bilateral: Secondary | ICD-10-CM | POA: Diagnosis not present

## 2017-03-11 DIAGNOSIS — H209 Unspecified iridocyclitis: Secondary | ICD-10-CM | POA: Diagnosis not present

## 2017-03-11 DIAGNOSIS — Z79899 Other long term (current) drug therapy: Secondary | ICD-10-CM | POA: Diagnosis not present

## 2017-03-11 DIAGNOSIS — F410 Panic disorder [episodic paroxysmal anxiety] without agoraphobia: Secondary | ICD-10-CM | POA: Diagnosis not present

## 2017-03-13 ENCOUNTER — Telehealth: Payer: Self-pay | Admitting: *Deleted

## 2017-03-13 ENCOUNTER — Other Ambulatory Visit: Payer: Self-pay | Admitting: Rheumatology

## 2017-03-13 ENCOUNTER — Telehealth: Payer: Self-pay | Admitting: Rheumatology

## 2017-03-13 NOTE — Telephone Encounter (Signed)
Patient advised that Dr. Estanislado Pandy does not want to fill this prescription because she is already on medications that affect her kidney and liver. Patient wants to know what she should do for her back. Patient states she has had 5 Humira injections since starting. Patient states she had Humira 2 weeks in a row when initially starting and then eery two week. Patient states that's how she was instructed to use it. Patient is also concerned about the area at the injection site. Will check on patient tomorrow.

## 2017-03-13 NOTE — Telephone Encounter (Signed)
Patient states she took her Humira injection on 03/11/17. Patient states the area where she gave the injection is swollen and red. Patient states area that is the size of a half dollar. Patient states the area that is swollen is hot and the size of a base ball. Patient states she took some hydroxyzine. Patient instructed to take some benadryl and to use topical hydrocortisone cream. Patient instructed with next injection to take Zyrtec the day before, the day of and the day after the injection. Patient verbalized understanding.

## 2017-03-13 NOTE — Telephone Encounter (Signed)
I spoke with patient. She is not having any joint pain or joint swelling. Her main concern is lower back pain which is been there for multiple years. She states that the lower back pain flares off and on. She takes sulindac only on when necessary basis for the lower back pain. Okay to refill sulindac. Side effects were discussed at length.

## 2017-03-13 NOTE — Telephone Encounter (Signed)
Do not advise NSAIDS  as she is on multiple other meds. Increased risk of hepatic and renal toxicity.

## 2017-03-13 NOTE — Telephone Encounter (Signed)
Patient calling again in reference to medication you can not fill for her. Does not know why you can not fill it. Please call her back.

## 2017-03-13 NOTE — Telephone Encounter (Signed)
Left message to advise patient we would be unable to refill medication.

## 2017-03-13 NOTE — Telephone Encounter (Signed)
Patient called again to let you know Kirsten Harper prescribed that rx she is asking for a refill on.

## 2017-03-13 NOTE — Telephone Encounter (Signed)
Last Visit: 01/16/17 Next Visit: 03/27/17 Labs: 03/03/17 WNL  We have not filled this medication for this patient since 06/2015.   Okay to refill Sulindac?

## 2017-03-13 NOTE — Telephone Encounter (Signed)
Patient needs a refill  On Sulindac 150mg . [facility] uses [facility].

## 2017-03-14 MED ORDER — SULINDAC 150 MG PO TABS: 150.0000 mg | ORAL_TABLET | Freq: Two times a day (BID) | ORAL | 2 refills | Status: DC | PRN

## 2017-03-14 NOTE — Telephone Encounter (Signed)
Patient advised prescription has been sent to the pharmacy.  

## 2017-03-19 NOTE — Progress Notes (Signed)
Office Visit Note  Patient: Kirsten Harper             Date of Birth: Feb 16, 1946           MRN: [id number]             PCP: Kelton Pillar, MD Referring: Kelton Pillar, MD Visit Date: 03/27/2017 Occupation: _0 @    Subjective:  Medication Management (has some injection site reaction on thigh ) and Decreased Visual Acuity (patient states she can see, but edges are blurred )   History of Present Illness: Kirsten Harper is a 71 y.o. female  With h/o of ankylosing spondylitis and uveitis. She was started on Humira in 01/2017. She states that she still has blurred vision. She has not had a flare since she started Humira. Dr. Manuella Ghazi advised fu visit in January. Pt. Is very disappointed as her vision is not normal. She denies joint pain.  She denies any hand pain. Pt. States that she is very anxious and needs help from her psychiatrist.  Activities of Daily Living:  Patient denies morning stiffness  Patient Denies nocturnal pain.  Difficulty dressing/grooming: Denies Difficulty climbing stairs: Denies Difficulty getting out of chair: Denies Difficulty using hands for taps, buttons, cutlery, and/or writing: Denies   Review of Systems  Constitutional: Negative.  Negative for fatigue, night sweats, weight gain, weight loss and weakness.  HENT: Negative.  Negative for mouth sores, trouble swallowing, trouble swallowing, mouth dryness and nose dryness.   Eyes: Positive for redness and visual disturbance. Negative for pain and dryness.  Respiratory: Negative.  Negative for cough, shortness of breath and difficulty breathing.   Cardiovascular: Negative.  Negative for chest pain, palpitations, hypertension, irregular heartbeat and swelling in legs/feet.  Gastrointestinal: Negative.  Negative for blood in stool, constipation and diarrhea.  Endocrine: Negative.  Negative for increased urination.  Genitourinary: Negative.  Negative for vaginal dryness.  Musculoskeletal: Negative.  Negative for  arthralgias, joint pain, joint swelling, myalgias, muscle weakness, morning stiffness, muscle tenderness and myalgias.  Skin: Negative.  Negative for color change, rash, hair loss, skin tightness, ulcers and sensitivity to sunlight.  Allergic/Immunologic: Negative for susceptible to infections.  Neurological: Positive for dizziness. Negative for memory loss and night sweats.  Hematological: Negative.  Negative for swollen glands.  Psychiatric/Behavioral: Positive for depressed mood and sleep disturbance. Negative for suicidal ideas (denies). The patient is not nervous/anxious.     PMFS History:  Patient Active Problem List   Diagnosis Date Noted  . Benzodiazepine dependence in remission ([place]) 10/13/2016  . Community acquired pneumonia of right lower lobe of lung ([place]) 09/24/2016  . Ankylosing spondylitis of lumbosacral region ([place]) 09/24/2016  . Major depressive disorder, recurrent severe without psychotic features ([place]) 08/27/2016  . GAD (generalized anxiety disorder) 08/27/2016  . 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 Spondyloarthropathy HLA-B27 positive 06/06/2016  . Iritis 06/06/2016  . High risk medication use 06/06/2016  . Primary osteoarthritis of both hands 06/06/2016  . Smoker 06/06/2016  . Acute respiratory failure with hypoxia ([place]) 04/10/2016  . COPD exacerbation ([place]) 04/10/2016  . Anxiety 04/10/2016  . Essential hypertension     Past Medical History:  Diagnosis Date  . Ankylosing spondylitis ([place])   . Anxiety   . Arthritis   . Chronic lower back pain   . CKD (chronic kidney disease), stage II    "related to the Ankylosing spondylitis" (04/10/2016)  . COPD (chronic obstructive pulmonary disease) ([place])   . Eye disease   .  Hypertension   . PONV (postoperative nausea and vomiting)    "didn't bother me the last time I had it in ~ 2015"    Family History  Problem Relation Age of Onset  . Hypertension Mother   . Heart disease Sister   .  Diabetes Sister   . Pulmonary fibrosis Brother   . Alzheimer's disease Brother    Past Surgical History:  Procedure Laterality Date  . BROW LIFT AND BLEPHAROPLASTY Bilateral ~ 2015  . CATARACT EXTRACTION W/ INTRAOCULAR LENS  IMPLANT, BILATERAL Bilateral 2000s  . MANDIBLE SURGERY  ~ 2000   "jaw had moved to left; broke my jaw in 5 places; put titanium in; stripped muscle from bone; wired jaw shut"  . TUBAL LIGATION  1979  . VAGINAL HYSTERECTOMY  1985   Social History   Social History Narrative  . No narrative on file     Objective: Vital Signs: BP 130/70   Pulse 74   Resp 16   Ht 5' 7.5" (1.715 m)   Wt 172 lb (78 kg)   BMI 26.54 kg/m    Physical Exam  Constitutional: She is oriented to person, place, and time. She appears well-developed and well-nourished.  HENT:  Head: Normocephalic and atraumatic.  Eyes: Conjunctivae and EOM are normal.  Neck: Normal range of motion.  Cardiovascular: Normal rate, regular rhythm, normal heart sounds and intact distal pulses.   Pulmonary/Chest: Effort normal and breath sounds normal.  Abdominal: Soft. Bowel sounds are normal.  Lymphadenopathy:    She has no cervical adenopathy.  Neurological: She is alert and oriented to person, place, and time.  Skin: Skin is warm and dry. Capillary refill takes less than 2 seconds.  Psychiatric: She has a normal mood and affect. Her behavior is normal.  Nursing note and vitals reviewed.    Musculoskeletal Exam: C-spine and thoracic  spine good range of motion. She has some discomfort with limited range of motion of her lumbar spine. Shoulder joints elbow joints wrist joints MCPs PIPs DIPs with good range of motion with no synovitis. Hip joints knee joints ankles MTPs PIPs DIPs with good range of motion with no synovitis.    CDAI Exam: CDAI Homunculus Exam:   Joint Counts:  CDAI Tender Joint count: 0 CDAI Swollen Joint count: 0  Global Assessments:  Patient Global Assessment: 2 Provider  Global Assessment: 2  CDAI Calculated Score: 4    Investigation: No additional findings.TB Gold: Negative in 12/2016 CBC Latest Ref Rng & Units 03/03/2017 11/25/2016 10/12/2016  WBC 3.8 - 10.8 K/uL 7.1 7.0 7.1  Hemoglobin 11.7 - 15.5 g/dL 14.9 14.3 14.1  Hematocrit 35.0 - 45.0 % 43.7 42.5 40.7  Platelets 140 - 400 K/uL 225 250 250   CMP Latest Ref Rng & Units 03/03/2017 11/25/2016 10/12/2016  Glucose 65 - 99 mg/dL 104(H) 113(H) 112(H)  BUN 7 - 25 mg/dL _0 Creatinine 0.50 - 0.99 mg/dL 0.87 0.92 0.80  Sodium 135 - 146 mmol/L 142 141 139  Potassium 3.5 - 5.3 mmol/L 4.3 4.5 3.9  Chloride 98 - 110 mmol/L 104 104 107  CO2 20 - 32 mmol/L _1 Calcium 8.6 - 10.4 mg/dL 10.6(H) 9.7 9.7  Total Protein 6.1 - 8.1 g/dL 7.0 6.8 7.3  Total Bilirubin 0.2 - 1.2 mg/dL 1.1 1.1 1.5(H)  Alkaline Phos 33 - 130 U/L 74 52 59  AST 10 - 35 U/L _2 ALT 6 - 29 U/L 17 12 25  Imaging: No results found.  Speciality Comments: No specialty comments available.    Procedures:  No procedures performed Allergies: Amoxapine and related   Assessment / Plan:     Visit Diagnoses: Ankylosing spondylitis of lumbosacral region ([place]) - HLA B-27+,History of SI joint sclerosis, knee joint effusion, or erosive changes in hands and feet.She is doing really well on Humira. She had no synovitis on examination. She does not have much SI joint pain. In fact patient states she does not have any joint pain currently. She does have some lower back discomfort due to underlying disc disease.  Uveitis - fu by Dr. Dwana Melena. Humira 40 mg sq q oweek(01/2017) according to patient Dr. Brigitte Pulse felt that her uveitis is very well controlled. She feels her vision is a still blurred and she needs more help.  High risk medication use - she has been on Humira since July 2018 which she is tolerating well. She does have some injection site reaction. I've advised her to use Zyrtec for that.  Primary osteoarthritis of both hands:  Some stiffness.  Dysuria  History of hypertension: Blood pressure is well controlled.  Former smoker  History of COPD  History of anxiety : Her anxiety level is very high. I've advised her to follow up closely with her psychiatrist and a counselor.   Orders: No orders of the defined types were placed in this encounter.  No orders of the defined types were placed in this encounter.   Face-to-face time spent with patient was 30 minutes. Greater than 50% of time was spent in counseling and coordination of care.  Follow-Up Instructions: Return in about 5 months (around 08/27/2017) for Ankylosing spondylitis, uveitis.   Bo Merino, MD  Note - This record has been created using Editor, commissioning.  Chart creation errors have been sought, but may not always  have been located. Such creation errors do not reflect on  the standard of medical care.

## 2017-03-24 DIAGNOSIS — G2581 Restless legs syndrome: Secondary | ICD-10-CM | POA: Diagnosis not present

## 2017-03-27 ENCOUNTER — Encounter: Payer: Self-pay | Admitting: Rheumatology

## 2017-03-27 ENCOUNTER — Ambulatory Visit (INDEPENDENT_AMBULATORY_CARE_PROVIDER_SITE_OTHER): Payer: Medicare Other | Admitting: Rheumatology

## 2017-03-27 ENCOUNTER — Telehealth: Payer: Self-pay | Admitting: Radiology

## 2017-03-27 VITALS — BP 130/70 | HR 74 | Resp 16 | Ht 67.5 in | Wt 172.0 lb

## 2017-03-27 DIAGNOSIS — M19042 Primary osteoarthritis, left hand: Secondary | ICD-10-CM | POA: Diagnosis not present

## 2017-03-27 DIAGNOSIS — M19041 Primary osteoarthritis, right hand: Secondary | ICD-10-CM

## 2017-03-27 DIAGNOSIS — R300 Dysuria: Secondary | ICD-10-CM

## 2017-03-27 DIAGNOSIS — H209 Unspecified iridocyclitis: Secondary | ICD-10-CM

## 2017-03-27 DIAGNOSIS — Z8709 Personal history of other diseases of the respiratory system: Secondary | ICD-10-CM

## 2017-03-27 DIAGNOSIS — Z8659 Personal history of other mental and behavioral disorders: Secondary | ICD-10-CM | POA: Diagnosis not present

## 2017-03-27 DIAGNOSIS — M457 Ankylosing spondylitis of lumbosacral region: Secondary | ICD-10-CM

## 2017-03-27 DIAGNOSIS — Z87891 Personal history of nicotine dependence: Secondary | ICD-10-CM | POA: Diagnosis not present

## 2017-03-27 DIAGNOSIS — Z79899 Other long term (current) drug therapy: Secondary | ICD-10-CM

## 2017-03-27 DIAGNOSIS — Z8679 Personal history of other diseases of the circulatory system: Secondary | ICD-10-CM

## 2017-03-27 NOTE — Patient Instructions (Signed)
Standing Labs We placed an order today for your standing lab work.    Please come back and get your standing labs in November and every 3 months  We have open lab Monday through Friday from 8:30-11:30 AM and 1:30-4 PM at the office of Dr. Jackee Glasner.   The office is located at [address], [address], [place], [place] [address] No appointment is necessary.   Labs are drawn by [facility].  You may receive a bill from [facility] for your lab work. If you have any questions regarding directions or hours of operation,  please call [phone].    

## 2017-04-03 DIAGNOSIS — H04123 Dry eye syndrome of bilateral lacrimal glands: Secondary | ICD-10-CM | POA: Diagnosis not present

## 2017-04-03 DIAGNOSIS — Z961 Presence of intraocular lens: Secondary | ICD-10-CM | POA: Diagnosis not present

## 2017-04-03 DIAGNOSIS — H20021 Recurrent acute iridocyclitis, right eye: Secondary | ICD-10-CM | POA: Diagnosis not present

## 2017-04-07 NOTE — Telephone Encounter (Signed)
error 

## 2017-04-08 ENCOUNTER — Ambulatory Visit (INDEPENDENT_AMBULATORY_CARE_PROVIDER_SITE_OTHER): Payer: Medicare Other | Admitting: Internal Medicine

## 2017-04-08 ENCOUNTER — Encounter: Payer: Self-pay | Admitting: Internal Medicine

## 2017-04-08 ENCOUNTER — Telehealth: Payer: Self-pay | Admitting: Rheumatology

## 2017-04-08 VITALS — BP 118/64 | HR 73 | Temp 98.3°F | Ht 66.5 in | Wt 173.0 lb

## 2017-04-08 DIAGNOSIS — M457 Ankylosing spondylitis of lumbosacral region: Secondary | ICD-10-CM | POA: Diagnosis not present

## 2017-04-08 DIAGNOSIS — J449 Chronic obstructive pulmonary disease, unspecified: Secondary | ICD-10-CM

## 2017-04-08 DIAGNOSIS — I10 Essential (primary) hypertension: Secondary | ICD-10-CM

## 2017-04-08 DIAGNOSIS — F39 Unspecified mood [affective] disorder: Secondary | ICD-10-CM | POA: Insufficient documentation

## 2017-04-08 DIAGNOSIS — R200 Anesthesia of skin: Secondary | ICD-10-CM

## 2017-04-08 LAB — VITAMIN B12: VITAMIN B 12: 373 pg/mL (ref 211–911)

## 2017-04-08 LAB — T4, FREE: Free T4: 0.91 ng/dL (ref 0.60–1.60)

## 2017-04-08 MED ORDER — ADALIMUMAB 40 MG/0.4ML ~~LOC~~ AJKT: 40.0000 mg | AUTO-INJECTOR | SUBCUTANEOUS | 0 refills | Status: DC

## 2017-04-08 NOTE — Assessment & Plan Note (Signed)
On humira Ongoing pain, etc

## 2017-04-08 NOTE — Telephone Encounter (Signed)
Patient taking last Humira injection tomorrow. Needs a refill called in. Please cal in for patient.

## 2017-04-08 NOTE — Assessment & Plan Note (Signed)
Anxiety is life long Some depression as well--but doesn't seem to fit MDD at this point Largely reactive component--money, disease process, etc Asking for clonazepam but has chart evidence of problems with benzos States cymbalta didn't help (didn't take for long) Also had lexapro and others

## 2017-04-08 NOTE — Progress Notes (Signed)
Subjective:    Patient ID: Kirsten Harper, female    DOB: [birth year], 71 y.o.   MRN: [id number]  HPI Here to establish care  Just noticed some "green diarrhea" Not sick No fever, N/V, eating okay  Chronic cloudy vision from the uveitis Follows with Dr Manuella Ghazi at [facility] Not really better with the [facility] Dr Patrecia Pour for the anklyosing spondylitis Diagnosed ~ age 58 but had symptoms for over 10 years before diagnosed Had been very sick--fever, etc Long standing Rx with indocin--then stopped at age 53 Takes sulindac prn  Had CKD 3 in past---likely related to NSAID use Now better  Has trouble sleeping for about a year ?RLS--but ropinirole no help Doesn't sleep due to her nerves--doesn't like living alone Especially troubling with her vision declining Chiropractor was concerned about neuropathy--- does have tingling Gabapentin only helped one time Did like clonazepam in past  No longer goes to psychiatrist Given vistaril for anxiety--but no help Still some depression--but she relates this to her nerves Feels the anxiety is chronic but also depression even as a child--but feels it is mostly reactive now  Has COPD-- chronic bronchitis in past Now doesn't cough regularly No longer takes the symbicort  Current Outpatient Prescriptions on File Prior to Visit  Medication Sig Dispense Refill  . albuterol (PROVENTIL HFA;VENTOLIN HFA) 108 (90 Base) MCG/ACT inhaler Inhale 2 puffs into the lungs every 4 (four) hours as needed for wheezing or shortness of breath (cough, shortness of breath or wheezing.). 1 Inhaler 1  . dorzolamide-timolol (COSOPT) 22.3-6.8 MG/ML ophthalmic solution     . losartan (COZAAR) 100 MG tablet Take 100 mg by mouth daily.     Marland Kitchen LOTEMAX 0.5 % ophthalmic suspension     . mirtazapine (REMERON) 30 MG tablet Take 30 mg by mouth at bedtime.     . ondansetron (ZOFRAN) 8 MG tablet Take 8 mg by mouth every 8 (eight) hours as needed for nausea.     . sulindac  (CLINORIL) 150 MG tablet Take 1 tablet (150 mg total) by mouth 2 (two) times daily as needed. 60 tablet 2   No current facility-administered medications on file prior to visit.     Allergies  Allergen Reactions  . Amoxapine And Related     Patient reports it did not work for her.     Past Medical History:  Diagnosis Date  . Ankylosing spondylitis ([place])   . Anxiety   . Arthritis   . Chronic lower back pain   . CKD (chronic kidney disease), stage II    "related to the Ankylosing spondylitis" (04/10/2016)  . COPD (chronic obstructive pulmonary disease) ([place])   . Eye disease   . Hypertension   . PONV (postoperative nausea and vomiting)    "didn't bother me the last time I had it in ~ 2015"    Past Surgical History:  Procedure Laterality Date  . BROW LIFT AND BLEPHAROPLASTY Bilateral ~ 2015  . CATARACT EXTRACTION W/ INTRAOCULAR LENS  IMPLANT, BILATERAL Bilateral 2000s  . MANDIBLE SURGERY  ~ 2000   "jaw had moved to left; broke my jaw in 5 places; put titanium in; stripped muscle from bone; wired jaw shut"  . TUBAL LIGATION  1979  . VAGINAL HYSTERECTOMY  1985    Family History  Problem Relation Age of Onset  . Hypertension Mother   . Heart disease Sister   . Diabetes Sister   . Pulmonary fibrosis Brother   . Alzheimer's disease Brother  Social History   Social History  . Marital status: Divorced    Spouse name: N/A  . Number of children: 2  . Years of education: N/A   Occupational History  . 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 Smoking status: Former Smoker    Packs/day: 0.50    Years: 51.00    Types: Cigarettes  . Smokeless tobacco: Never Used  . Alcohol use 0.6 oz/week    1 Glasses of wine per week  . Drug use: No  . Sexual activity: No   Other Topics Concern  . Not on file   Social History Narrative  . No narrative on file   Review of Systems  Constitutional: Negative for unexpected weight change.        Appetite is fine  HENT: Positive for tinnitus. Negative for hearing loss.   Eyes: Positive for visual disturbance.       Eye soreness better with the drops  Respiratory: Positive for shortness of breath.        Has inhaler and uses at time  Cardiovascular: Positive for palpitations and leg swelling. Negative for chest pain.  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  Genitourinary: Negative for difficulty urinating, frequency and hematuria.  Musculoskeletal: Positive for arthralgias and back pain.  Skin: Negative for rash.       Concerned about flabby right gastroc  Allergic/Immunologic: Negative for environmental allergies and immunocompromised state.  Neurological: Negative for dizziness, syncope, light-headedness and headaches.  Hematological: Negative for adenopathy. Does not bruise/bleed easily.  Psychiatric/Behavioral: Positive for dysphoric mood and sleep disturbance. The patient is nervous/anxious.        Objective:   Physical Exam  Constitutional: No distress.  HENT:  Mouth/Throat: Oropharynx is clear and moist. No oropharyngeal exudate.  Neck: No thyromegaly present.  Cardiovascular: Normal rate, regular rhythm, normal heart sounds and intact distal pulses.  Exam reveals no gallop.   No murmur heard. Pulmonary/Chest: Effort normal and breath sounds normal. No respiratory distress. She has no wheezes. She has no rales.  Abdominal: Soft. There is no tenderness.  Musculoskeletal: She exhibits no edema.  Lymphadenopathy:    She has no cervical adenopathy.  Neurological:  Decreased sensation in feet  Skin: No rash noted. No erythema.  Psychiatric:  Anxious and some degree of depression          Assessment & Plan:

## 2017-04-08 NOTE — Assessment & Plan Note (Signed)
Seems to have neuropathy Will check B12 and thyroid--just in case (and protein electrophoresis)

## 2017-04-08 NOTE — Assessment & Plan Note (Signed)
BP Readings from Last 3 Encounters:  04/08/17 118/64  03/27/17 130/70  01/22/17 117/66   On losartan

## 2017-04-08 NOTE — Assessment & Plan Note (Signed)
Recurrent bronchitis but this diagnosis is in question

## 2017-04-08 NOTE — Telephone Encounter (Signed)
Last Visit: 03/27/17 Next Visit: 08/28/17 Labs: 03/03/17 WNL TB Gold: 12/19/16 Neg   Okay to refill per Dr. Estanislado Pandy   Prescription faxed to [place] and patient advised.

## 2017-04-09 ENCOUNTER — Encounter: Payer: Self-pay | Admitting: *Deleted

## 2017-04-09 ENCOUNTER — Telehealth: Payer: Self-pay | Admitting: Rheumatology

## 2017-04-09 ENCOUNTER — Telehealth: Payer: Self-pay

## 2017-04-09 NOTE — Telephone Encounter (Signed)
Patient came by to sign and complete her portion of the [employer] Patient Assistance Application for Humira for 2019. Provider portion is not needed *see previous note.* Application was faxed. Will update once we receive a response.  Rhyatt Muska, [place], CPhT 2:27 PM

## 2017-04-09 NOTE — Telephone Encounter (Signed)
Prescription taken care of

## 2017-04-09 NOTE — Telephone Encounter (Signed)
Patient calling to let you know you may want to resend paperwork to insurance company. She spoke with them, and they have not received anything.

## 2017-04-09 NOTE — Telephone Encounter (Signed)
Patient called with questions about her refill for Humira through [facility]. She states that a representative told her that she must submit financial documents for 2018 before she can receive her next refill.   Called foundation to get clarification. Spoke to [place] who states that the patient will be required to submit an application with financial documents to renew her assistance for 2019. The Rx that was submitted on yesterday was the provider portion for the appliation. This can be used for her renewal application for 1102. We will need to submit a refill on the correct form. Will you send a refill on the new form for patient? Thanks!  Called patient to update her. She will come by today to sign her portion of the renewal application for 1117 to be submitted. I will fax the application for her and update one we receive a response.   Demani Mcbrien, [place], CPhT 10:25 AM

## 2017-04-10 ENCOUNTER — Telehealth: Payer: Self-pay | Admitting: Radiology

## 2017-04-10 NOTE — Telephone Encounter (Signed)
Gave verbal order for patient to have her Humira today, there was confusion about date of the Rx, and name was blacked out on the form.

## 2017-04-10 NOTE — Telephone Encounter (Signed)
Returned patients' call. She states that she called [employer] Patient assistance foundation yesterday and they had not yet received her Humira Rx.   Called foundation to verify. Spoke to pharmacy representative who states that the Rx did not have a date on it and requested a verbal. Call was transferred to [place] to give verbal.   Informed patient that the rx was received and she should be receiving a call from them to set up shipment. Patient voiced understanding and denies any questions at this time.   Hyla Coard, [place], CPhT 10:38 AM

## 2017-04-11 ENCOUNTER — Encounter: Payer: Self-pay | Admitting: *Deleted

## 2017-04-11 DIAGNOSIS — F410 Panic disorder [episodic paroxysmal anxiety] without agoraphobia: Secondary | ICD-10-CM | POA: Diagnosis not present

## 2017-04-11 DIAGNOSIS — Z79899 Other long term (current) drug therapy: Secondary | ICD-10-CM | POA: Diagnosis not present

## 2017-04-14 LAB — PROTEIN ELECTROPHORESIS, SERUM, WITH REFLEX
ALBUMIN ELP: 4.6 g/dL (ref 3.8–4.8)
Alpha 1: 0.3 g/dL (ref 0.2–0.3)
Alpha 2: 0.5 g/dL (ref 0.5–0.9)
BETA 2: 0.4 g/dL (ref 0.2–0.5)
BETA GLOBULIN: 0.5 g/dL (ref 0.4–0.6)
Gamma Globulin: 1 g/dL (ref 0.8–1.7)
TOTAL PROTEIN: 7.2 g/dL (ref 6.1–8.1)

## 2017-04-14 LAB — IFE INTERPRETATION

## 2017-04-24 ENCOUNTER — Ambulatory Visit: Payer: Self-pay | Admitting: Internal Medicine

## 2017-04-25 ENCOUNTER — Encounter: Payer: Self-pay | Admitting: Internal Medicine

## 2017-04-25 ENCOUNTER — Ambulatory Visit (INDEPENDENT_AMBULATORY_CARE_PROVIDER_SITE_OTHER): Payer: Medicare Other | Admitting: Internal Medicine

## 2017-04-25 VITALS — BP 110/68 | HR 64 | Temp 98.5°F | Wt 181.0 lb

## 2017-04-25 DIAGNOSIS — G2581 Restless legs syndrome: Secondary | ICD-10-CM | POA: Diagnosis not present

## 2017-04-25 DIAGNOSIS — F39 Unspecified mood [affective] disorder: Secondary | ICD-10-CM

## 2017-04-25 NOTE — Assessment & Plan Note (Signed)
Does okay with the requip Asked her to confirm the dosage

## 2017-04-25 NOTE — Progress Notes (Signed)
Subjective:    Patient ID: Kirsten Harper, female    DOB: 08/11/1945, 71 y.o.   MRN: [id number]  HPI Here for follow up of her chronic medical conditions Mostly her anxiety  Did see Dr Rosine Door Got prescription for clonazepam from him She feels much better now  Has been on similar meds (valium, alprazolam, then clonazepam) Clonazepam works about the same but lasts longer It doesn't make her sleepy--hasn't slept well in at least a year Does occasionally take daytime naps  Mirtazapine seems to worsen RLS type symptoms Only takes this in the day--if she takes it at all Does get some relief from requip--3 tabs (she thinks it is 1mg )  Current Outpatient Prescriptions on File Prior to Visit  Medication Sig Dispense Refill  . Adalimumab (HUMIRA PEN) 40 MG/0.4ML PNKT Inject 40 mg into the skin every 14 (fourteen) days. 3 each 0  . albuterol (PROVENTIL HFA;VENTOLIN HFA) 108 (90 Base) MCG/ACT inhaler Inhale 2 puffs into the lungs every 4 (four) hours as needed for wheezing or shortness of breath (cough, shortness of breath or wheezing.). 1 Inhaler 1  . dorzolamide-timolol (COSOPT) 22.3-6.8 MG/ML ophthalmic solution     . losartan (COZAAR) 100 MG tablet Take 100 mg by mouth daily.     Marland Kitchen LOTEMAX 0.5 % ophthalmic suspension     . mirtazapine (REMERON) 30 MG tablet Take 30 mg by mouth at bedtime.     . ondansetron (ZOFRAN) 8 MG tablet Take 8 mg by mouth every 8 (eight) hours as needed for nausea.     . sulindac (CLINORIL) 150 MG tablet Take 1 tablet (150 mg total) by mouth 2 (two) times daily as needed. 60 tablet 2   No current facility-administered medications on file prior to visit.     Allergies  Allergen Reactions  . Amoxapine And Related     Patient reports it did not work for her.     Past Medical History:  Diagnosis Date  . Ankylosing spondylitis ([place])   . Anxiety   . Arthritis   . Chronic lower back pain   . CKD (chronic kidney disease), stage II    "related to the Ankylosing  spondylitis" (04/10/2016)  . COPD (chronic obstructive pulmonary disease) ([place])   . Eye disease   . Hypertension   . PONV (postoperative nausea and vomiting)    "didn't bother me the last time I had it in ~ 2015"    Past Surgical History:  Procedure Laterality Date  . BROW LIFT AND BLEPHAROPLASTY Bilateral ~ 2015  . CATARACT EXTRACTION W/ INTRAOCULAR LENS  IMPLANT, BILATERAL Bilateral 2000s  . MANDIBLE SURGERY  ~ 2000   "jaw had moved to left; broke my jaw in 5 places; put titanium in; stripped muscle from bone; wired jaw shut"  . TUBAL LIGATION  1979  . VAGINAL HYSTERECTOMY  1985    Family History  Problem Relation Age of Onset  . Hypertension Mother   . Heart disease Sister   . Diabetes Sister   . Pulmonary fibrosis Brother   . Alzheimer's disease Brother     Social History   Social History  . Marital status: Divorced    Spouse name: N/A  . Number of children: 2  . Years of education: N/A   Occupational History  . 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 Smoking status: Former Smoker    Packs/day: 0.50    Years: 51.00  Types: Cigarettes  . Smokeless tobacco: Never Used  . Alcohol use 0.6 oz/week    1 Glasses of wine per week  . Drug use: No  . Sexual activity: No   Other Topics Concern  . Not on file   Social History Narrative   2 sons--not in area   Lives on property with her mother [name]      Has living will   Would want one of her sons, [name], to be Media planner   Requests DNR   No tube feeds   Review of Systems No longer drinks alcohol Appetite is fine Weight up some    Objective:   Physical Exam        Assessment & Plan:

## 2017-04-25 NOTE — Assessment & Plan Note (Signed)
Better back on clonazepam Getting from Dr Rosine Door for now Still lots of stress Reviewed CSRS--nothing for several months as she stated

## 2017-04-25 NOTE — Patient Instructions (Signed)
Stop the mirtazapine since you don't do well with it. Try to take only 1 or 2 of the requip at night--to see if that also helps your restless legs syndrome.

## 2017-04-30 ENCOUNTER — Other Ambulatory Visit: Payer: Self-pay

## 2017-04-30 MED ORDER — ROPINIROLE HCL 1 MG PO TABS: 3.0000 mg | ORAL_TABLET | Freq: Every day | ORAL | 11 refills | Status: DC

## 2017-04-30 NOTE — Telephone Encounter (Signed)
Approved: #[id number] x 11

## 2017-04-30 NOTE — Telephone Encounter (Signed)
Pt left v/m; pt seen 04/25/17 and pt could not sleep last night due to restless leg.pt request refill requip to [facility]. Cannot see where Dr Silvio Pate has filled before.

## 2017-05-13 ENCOUNTER — Telehealth: Payer: Self-pay

## 2017-05-13 NOTE — Telephone Encounter (Signed)
Called [employer] Patient [employer] to check the dates for patient renewal applications for [id number]. Spoke to [place] who states that the patient should be receiving an end of year questionnaire for patient to fill out and send back. She should receive it no later than thanksgiving. If not, patient will need to call the foundation [phone])  Called patient to update. Patient has received the questionnaire in the mail. She will fill it out and mail it back in. Will update once we receive a response.   Miasha Emmons, [place], CPhT 12:25 PM

## 2017-05-21 NOTE — Telephone Encounter (Signed)
Received a fax from [name] patient assistance stating that the patient has been approved to receive Humira through the foundation through December 2019.   Will send document to scan center.   Called patient to update. She voices understanding and denies any questions at this time.   Kirsten Harper, [place], CPhT 11:18 AM

## 2017-06-02 DIAGNOSIS — J019 Acute sinusitis, unspecified: Secondary | ICD-10-CM | POA: Diagnosis not present

## 2017-06-02 DIAGNOSIS — B9689 Other specified bacterial agents as the cause of diseases classified elsewhere: Secondary | ICD-10-CM | POA: Diagnosis not present

## 2017-06-02 DIAGNOSIS — I10 Essential (primary) hypertension: Secondary | ICD-10-CM | POA: Diagnosis not present

## 2017-06-02 DIAGNOSIS — J449 Chronic obstructive pulmonary disease, unspecified: Secondary | ICD-10-CM | POA: Diagnosis not present

## 2017-07-04 ENCOUNTER — Telehealth (INDEPENDENT_AMBULATORY_CARE_PROVIDER_SITE_OTHER): Payer: Self-pay

## 2017-07-04 NOTE — Telephone Encounter (Signed)
Patient called stating that she is on Humira, but would like to know if there is anything over the counter that she can take?  CB# [phone].  Please advise.  Thank you.

## 2017-07-09 NOTE — Telephone Encounter (Signed)
Dr. Estanislado Pandy spoke to patient on 07/04/2017 at 5:30 pm. Patient wanted to be evaluated for cold like symptoms she was having. Dr. Estanislado Pandy encouraged patient to go to urgent care and to hold off on taking Humira.

## 2017-07-14 ENCOUNTER — Other Ambulatory Visit: Payer: Self-pay | Admitting: Internal Medicine

## 2017-07-14 DIAGNOSIS — R059 Cough, unspecified: Secondary | ICD-10-CM

## 2017-07-14 DIAGNOSIS — J449 Chronic obstructive pulmonary disease, unspecified: Secondary | ICD-10-CM

## 2017-07-14 DIAGNOSIS — R062 Wheezing: Secondary | ICD-10-CM

## 2017-07-14 DIAGNOSIS — J208 Acute bronchitis due to other specified organisms: Secondary | ICD-10-CM

## 2017-07-14 DIAGNOSIS — R05 Cough: Secondary | ICD-10-CM

## 2017-07-14 NOTE — Telephone Encounter (Signed)
Copied from [place] [id number]. Topic: Quick Communication - Rx Refill/Question >> Jul 14, 2017 10:05 AM Yvette Rack wrote: Has the patient contacted their pharmacy? No.   (Agent: If no, request that the patient contact the pharmacy for the refill.)albuterol (PROVENTIL HFA;VENTOLIN HFA) 108 (90 Base) MCG/ACT inhaler   PATIENT IS COMPLETELY OUT   Preferred Pharmacy (with phone number or street name): [id number] - [place], [place] - [address] [phone] (Phone) [phone] (Fax)     Agent: Please be advised that RX refills may take up to 3 business days. We ask that you follow-up with your pharmacy.

## 2017-07-14 NOTE — Telephone Encounter (Signed)
Pt requesting med  Has not been filled since 04/13/16  Provider that ordered it is not at [facility]

## 2017-07-16 MED ORDER — ALBUTEROL SULFATE HFA 108 (90 BASE) MCG/ACT IN AERS: 2.0000 | INHALATION_SPRAY | RESPIRATORY_TRACT | 0 refills | Status: DC | PRN

## 2017-07-16 NOTE — Telephone Encounter (Signed)
Noted, history of COPD. Will send one refill to pharmacy, recommend she follow up with PCP for recurrent use of albuterol. May need to go back on Symbicort.

## 2017-07-22 ENCOUNTER — Telehealth: Payer: Self-pay | Admitting: Rheumatology

## 2017-07-22 DIAGNOSIS — H0233 Blepharochalasis right eye, unspecified eyelid: Secondary | ICD-10-CM | POA: Diagnosis not present

## 2017-07-22 DIAGNOSIS — H35371 Puckering of macula, right eye: Secondary | ICD-10-CM | POA: Diagnosis not present

## 2017-07-22 DIAGNOSIS — Z961 Presence of intraocular lens: Secondary | ICD-10-CM | POA: Diagnosis not present

## 2017-07-22 DIAGNOSIS — H35033 Hypertensive retinopathy, bilateral: Secondary | ICD-10-CM | POA: Diagnosis not present

## 2017-07-22 DIAGNOSIS — H209 Unspecified iridocyclitis: Secondary | ICD-10-CM | POA: Diagnosis not present

## 2017-07-22 DIAGNOSIS — M450 Ankylosing spondylitis of multiple sites in spine: Secondary | ICD-10-CM | POA: Diagnosis not present

## 2017-07-22 NOTE — Telephone Encounter (Signed)
Patient states she is taking her last Humira injection. Patient is requesting her prescription to be sent to [facility].   Last Visit: 03/27/17 Next Visit :08/13/17 Labs: 03/03/17 WNL TB Gold: 12/19/16 Neg   Patient will update labs 07/23/17.   Okay to refill per Dr. Estanislado Pandy

## 2017-07-22 NOTE — Telephone Encounter (Signed)
Patient called stating that she only has one Humira left.  She states she was told to hold off on taking the medicine when she had a cold but now is feeling better and is unsure what to do.  CB# [phone]

## 2017-07-23 ENCOUNTER — Other Ambulatory Visit: Payer: Self-pay

## 2017-07-23 DIAGNOSIS — Z79899 Other long term (current) drug therapy: Secondary | ICD-10-CM

## 2017-07-23 LAB — CBC WITH DIFFERENTIAL/PLATELET
BASOS ABS: 31 {cells}/uL (ref 0–200)
Basophils Relative: 0.4 %
EOS PCT: 4.9 %
Eosinophils Absolute: 377 cells/uL (ref 15–500)
HCT: 41.2 % (ref 35.0–45.0)
HEMOGLOBIN: 14.5 g/dL (ref 11.7–15.5)
LYMPHS ABS: 2456 {cells}/uL (ref 850–3900)
MCH: 32.6 pg (ref 27.0–33.0)
MCHC: 35.2 g/dL (ref 32.0–36.0)
MCV: 92.6 fL (ref 80.0–100.0)
MPV: 10.5 fL (ref 7.5–12.5)
Monocytes Relative: 10.6 %
NEUTROS ABS: 4019 {cells}/uL (ref 1500–7800)
Neutrophils Relative %: 52.2 %
Platelets: 255 10*3/uL (ref 140–400)
RBC: 4.45 10*6/uL (ref 3.80–5.10)
RDW: 12.8 % (ref 11.0–15.0)
Total Lymphocyte: 31.9 %
WBC mixed population: 816 cells/uL (ref 200–950)
WBC: 7.7 10*3/uL (ref 3.8–10.8)

## 2017-07-23 LAB — COMPLETE METABOLIC PANEL WITH GFR
AG Ratio: 1.8 (calc) (ref 1.0–2.5)
ALT: 13 U/L (ref 6–29)
AST: 13 U/L (ref 10–35)
Albumin: 4.4 g/dL (ref 3.6–5.1)
Alkaline phosphatase (APISO): 72 U/L (ref 33–130)
BUN: 12 mg/dL (ref 7–25)
CO2: 28 mmol/L (ref 20–32)
CREATININE: 0.79 mg/dL (ref 0.50–0.99)
Calcium: 9.6 mg/dL (ref 8.6–10.4)
Chloride: 107 mmol/L (ref 98–110)
GFR, EST AFRICAN AMERICAN: 89 mL/min/{1.73_m2} (ref >=60)
GFR, Est Non African American: 76 mL/min/{1.73_m2} (ref >=60)
GLUCOSE: 98 mg/dL (ref 65–99)
Globulin: 2.5 g/dL (calc) (ref 1.9–3.7)
Potassium: 4.1 mmol/L (ref 3.5–5.3)
Sodium: 141 mmol/L (ref 135–146)
TOTAL PROTEIN: 6.9 g/dL (ref 6.1–8.1)
Total Bilirubin: 1.1 mg/dL (ref 0.2–1.2)

## 2017-07-30 NOTE — Progress Notes (Signed)
Office Visit Note  Patient: Kirsten Harper             Date of Birth: 11/20/1945           MRN: [id number]             PCP: Venia Carbon, MD Referring: Venia Carbon, MD Visit Date: 08/13/2017 Occupation: '@GUAROCC'$ @    Subjective:  Cellulitis of left 3rd toe  History of Present Illness: Kirsten Harper is a 72 y.o. female with history of ankylosing spondylitis and uveitis.  Patient states she continues to be on Humira.  She states that about 2 months ago she stubbed her left 3rd toe and developed a wound, which has become infected.   She states the toenail fell off and has developed into a scab.  The wound reopened after hitting it on a door frame, and it has not healed yet.  She states she was seen in Urgent Care 5 days ago, and she was diagnosed with cellulitis and started on Doxycycline 100 mg for 10 days.  She has been soaking her toe in warm water daily.  She denies any fevers or chills.  She has been having to wear flip flops because she can not tolerate shoes.  She states she has not discontinued Humira.  Her last injection was 5 days ago.   She states she continues to see Dr. Manuella Ghazi for management of her uveitis.  She report he has told her that Humira is helping her uveitis, but she has not noticed any improvement.  She experiences eye dryness.   She denies any current SI joint pain or knee pain at this time.  She denies any joint swelling or joint stiffness.  She takes Sulindac when she occasionally experiences discomfort in her knees.      Activities of Daily Living:  Patient reports morning stiffness for 0 minutes.   Patient Denies nocturnal pain.  Difficulty dressing/grooming: Denies Difficulty climbing stairs: Reports Difficulty getting out of chair: Denies Difficulty using hands for taps, buttons, cutlery, and/or writing: Denies   Review of Systems  Constitutional: Positive for fatigue. Negative for weakness.  HENT: Negative for mouth sores, mouth dryness and nose  dryness.   Eyes: Positive for dryness. Negative for redness and visual disturbance.  Respiratory: Negative for cough, hemoptysis, shortness of breath and difficulty breathing.   Cardiovascular: Negative for chest pain, palpitations, hypertension, irregular heartbeat and swelling in legs/feet.  Gastrointestinal: Negative for blood in stool, constipation and diarrhea.  Endocrine: Negative for increased urination.  Genitourinary: Negative for painful urination.  Musculoskeletal: Negative for arthralgias, joint pain, joint swelling, myalgias, muscle weakness, morning stiffness, muscle tenderness and myalgias.  Skin: Positive for redness (Cellulits on left 3rd toe). Negative for color change, pallor, rash, hair loss, nodules/bumps, skin tightness and sensitivity to sunlight.  Neurological: Negative for dizziness, numbness and headaches.  Hematological: Negative for swollen glands.  Psychiatric/Behavioral: Positive for sleep disturbance. Negative for depressed mood. The patient is nervous/anxious.     PMFS History:  Patient Active Problem List   Diagnosis Date Noted  . RLS (restless legs syndrome) 04/25/2017  . Mood disorder ([place]) 04/08/2017  . Sensory loss 04/08/2017  . COPD (chronic obstructive pulmonary disease) ([place]) 04/08/2017  . Ankylosing spondylitis of lumbosacral region ([place]) 09/24/2016  . 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 GAD (generalized anxiety disorder) 08/27/2016  . 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 Spondyloarthropathy HLA-B27 positive 06/06/2016  .  Iritis 06/06/2016  . High risk medication use 06/06/2016  . Primary osteoarthritis of both hands 06/06/2016  . Smoker 06/06/2016  . COPD exacerbation ([place]) 04/10/2016  . Anxiety 04/10/2016  . Essential hypertension     Past Medical History:  Diagnosis Date  . Ankylosing spondylitis ([place])   . Anxiety   . Arthritis   . Chronic lower back pain    . CKD (chronic kidney disease), stage II    "related to the Ankylosing spondylitis" (04/10/2016)  . COPD (chronic obstructive pulmonary disease) ([place])   . Eye disease   . Hypertension   . PONV (postoperative nausea and vomiting)    "didn't bother me the last time I had it in ~ 2015"    Family History  Problem Relation Age of Onset  . Hypertension Mother   . Heart disease Sister   . Diabetes Sister   . Pulmonary fibrosis Brother   . Alzheimer's disease Brother    Past Surgical History:  Procedure Laterality Date  . BROW LIFT AND BLEPHAROPLASTY Bilateral ~ 2015  . CATARACT EXTRACTION W/ INTRAOCULAR LENS  IMPLANT, BILATERAL Bilateral 2000s  . MANDIBLE SURGERY  ~ 2000   "jaw had moved to left; broke my jaw in 5 places; put titanium in; stripped muscle from bone; wired jaw shut"  . TUBAL LIGATION  1979  . VAGINAL HYSTERECTOMY  1985   Social History   Social History Narrative   2 sons--not in area   Lives on property with her mother [name]      Has living will   Would want one of her sons, [name], to be Media planner   Requests DNR   No tube feeds     Objective: Vital Signs: BP 125/75 (BP Location: Left Arm, Patient Position: Sitting, Cuff Size: Normal)   Pulse 77   Resp 16   Ht 5' 7.5" (1.715 m)   Wt 187 lb (84.8 kg)   BMI 28.86 kg/m    Physical Exam  Constitutional: She is oriented to person, place, and time. She appears well-developed and well-nourished.  HENT:  Head: Normocephalic and atraumatic.  Eyes: Conjunctivae and EOM are normal.  Neck: Normal range of motion.  Cardiovascular: Normal rate, regular rhythm, normal heart sounds and intact distal pulses.  Pulmonary/Chest: Effort normal and breath sounds normal.  Abdominal: Soft. Bowel sounds are normal.  Lymphadenopathy:    She has no cervical adenopathy.  Neurological: She is alert and oriented to person, place, and time.  Skin: Skin is warm and dry. Capillary refill takes less than 2 seconds.    Psychiatric: She has a normal mood and affect. Her behavior is normal.  Nursing note and vitals reviewed.    Musculoskeletal Exam: C-spine and thoracic good ROM.  Lumbar some stiffness with mild discomfort.  Shoulder joints, elbow joints, wrist joints, MCPs, PIPs, and DIPs good ROM with no synovitis.  Hip joints, knee joints, ankle joints, MTPs, PIPs, and DIPs good ROM with no synovitis.  She has an infection of her left 3rd toe.  The wound has eschar.    CDAI Exam: No CDAI exam completed.    Investigation: No additional findings.TB Gold: 12/19/2016 Negative  CBC Latest Ref Rng & Units 07/23/2017 03/03/2017 11/25/2016  WBC 3.8 - 10.8 Thousand/uL 7.7 7.1 7.0  Hemoglobin 11.7 - 15.5 g/dL 14.5 14.9 14.3  Hematocrit 35.0 - 45.0 % 41.2 43.7 42.5  Platelets 140 - 400 Thousand/uL 255 225 250   CMP Latest Ref Rng & Units 07/23/2017 04/08/2017  03/03/2017  Glucose 65 - 99 mg/dL 98 - 104(H)  BUN 7 - 25 mg/dL 12 - 18  Creatinine 0.50 - 0.99 mg/dL 0.79 - 0.87  Sodium 135 - 146 mmol/L 141 - 142  Potassium 3.5 - 5.3 mmol/L 4.1 - 4.3  Chloride 98 - 110 mmol/L 107 - 104  CO2 20 - 32 mmol/L 28 - 27  Calcium 8.6 - 10.4 mg/dL 9.6 - 10.6(H)  Total Protein 6.1 - 8.1 g/dL 6.9 7.2 7.0  Total Bilirubin 0.2 - 1.2 mg/dL 1.1 - 1.1  Alkaline Phos 33 - 130 U/L - - 74  AST 10 - 35 U/L 13 - 17  ALT 6 - 29 U/L 13 - 17    Imaging: No results found.  Speciality Comments: No specialty comments available.    Procedures:  No procedures performed Allergies: Amoxapine and related   Assessment / Plan:     Visit Diagnoses: Ankylosing spondylitis of lumbosacral region ([place]) - HLA B-27+,History of SI joint sclerosis, knee joint effusion, or erosive changes in hands and feet.  She is clinically stable on Humira.  She currently does not have any joint pain or joint swelling.  She takes Sulindac PRN for joint pain.  She has some stiffness in the lumbar spine, but she has no SI joint tenderness on exam.  No achilles  tendonitis or plantar fasciitis.  She is on Humira.   I will advised her to hold off Humira until her evaluation by Dr. Sharol Given to is complete.  Uveitis - fu by Dr. Dwana Melena. Humira 40 mg sq q oweek(01/2017) according to patient Dr. Brigitte Pulse felt that her uveitis is very well controlled.  High risk medication use - Humira-CBC and CMP were WNL on 07/23/17. She will be due for routine CBC and CMP in April and every 3 months.   Primary osteoarthritis of both hands: She has PIP and DIP synovial thickening consistent with osteoarthritis.  She has no discomfort in her hands at this time.  No synovitis on exam.   Former smoker: She occasionally still smokes  Infection of toe -She started Doxycycline 100 mg BID for 10 days on Saturday after being seen in Urgent care. No x-rays were performed at this visit. She is on immunosuppresive therapy (humira) and she has not discontinued use while she has had this wound for 2 months. She has not been performing any type of wound care besides soaking her foot in warm water.  She has been wearing flip flops because she cannot tolerate any other shoes.  She does not have any fevers or chills at this time.  An urgent referral was made for her to see Dr. Sharol Given to rule out osteomyelitis.  We will follow up in 2 months.  Plan: Ambulatory referral to Orthopedic Surgery   History of COPD  History of anxiety - Chronic  History of hypertension     Orders: Orders Placed This Encounter  Procedures  . Ambulatory referral to Orthopedic Surgery   No orders of the defined types were placed in this encounter.   Face-to-face time spent with patient was 30 minutes.Greater than 50% of time was spent in counseling and coordination of care.  Follow-Up Instructions: Return in about 2 months (around 10/11/2017) for Ankylosing Spondylitis, uveitis.  Hazel Sams PA-C  Bo Merino, MD  Note - This record has been created using Dragon software.  Chart creation errors have been  sought, but may not always  have been located. Such creation errors do not reflect  on  the standard of medical care.

## 2017-08-07 ENCOUNTER — Telehealth: Payer: Self-pay | Admitting: Rheumatology

## 2017-08-07 NOTE — Telephone Encounter (Signed)
Patient advised she should see a foot specialist or her PCP. Patient states she will just go to urgent care.

## 2017-08-07 NOTE — Telephone Encounter (Signed)
Patient called stating that she stubbed her toe a few weeks ago and her nail came off and toe got infected (middle toe).  She is unable to wear shoes and now it feels like she has a pebble on the ball of her foot.  She has an appointment on 1/30 but doesn't know if she needs to be seen sooner.  CB# [phone]

## 2017-08-07 NOTE — Telephone Encounter (Signed)
Patient asked if she should take her Humira with her toes being infected. Patient advised she should have it check out as soon as possible and that she should hold it if they do declare it is infected and place her on antibiotics.

## 2017-08-07 NOTE — Telephone Encounter (Signed)
Yes she should see a foot specialist or her PCP.

## 2017-08-07 NOTE — Telephone Encounter (Signed)
Patient left a voicemail stating that she received her Humira and was told to contact you when it came in.  She also wants to talk to you regarding one of her toes.  CB# [phone]

## 2017-08-07 NOTE — Telephone Encounter (Signed)
Patient called stating that she stubbed her toe a few weeks ago and her nail came off and toe got infected (middle toe).  She is unable to wear shoes and now it feels like she has a pebble on the ball of her foot.  She has an appointment on 1/30 but doesn't know if she needs to be seen sooner. Should she see [place] or a foot specialist?

## 2017-08-09 DIAGNOSIS — J449 Chronic obstructive pulmonary disease, unspecified: Secondary | ICD-10-CM | POA: Diagnosis not present

## 2017-08-09 DIAGNOSIS — M459 Ankylosing spondylitis of unspecified sites in spine: Secondary | ICD-10-CM | POA: Diagnosis not present

## 2017-08-09 DIAGNOSIS — L03032 Cellulitis of left toe: Secondary | ICD-10-CM | POA: Diagnosis not present

## 2017-08-13 ENCOUNTER — Encounter: Payer: Self-pay | Admitting: Rheumatology

## 2017-08-13 ENCOUNTER — Ambulatory Visit (INDEPENDENT_AMBULATORY_CARE_PROVIDER_SITE_OTHER): Payer: Medicare Other

## 2017-08-13 ENCOUNTER — Ambulatory Visit (INDEPENDENT_AMBULATORY_CARE_PROVIDER_SITE_OTHER): Payer: Medicare Other | Admitting: Rheumatology

## 2017-08-13 ENCOUNTER — Ambulatory Visit (INDEPENDENT_AMBULATORY_CARE_PROVIDER_SITE_OTHER): Payer: Medicare Other | Admitting: Orthopedic Surgery

## 2017-08-13 ENCOUNTER — Encounter (INDEPENDENT_AMBULATORY_CARE_PROVIDER_SITE_OTHER): Payer: Self-pay | Admitting: Family

## 2017-08-13 VITALS — BP 125/75 | HR 77 | Resp 16 | Ht 67.5 in | Wt 187.0 lb

## 2017-08-13 DIAGNOSIS — M19041 Primary osteoarthritis, right hand: Secondary | ICD-10-CM

## 2017-08-13 DIAGNOSIS — M457 Ankylosing spondylitis of lumbosacral region: Secondary | ICD-10-CM

## 2017-08-13 DIAGNOSIS — L02612 Cutaneous abscess of left foot: Secondary | ICD-10-CM

## 2017-08-13 DIAGNOSIS — Z87891 Personal history of nicotine dependence: Secondary | ICD-10-CM | POA: Diagnosis not present

## 2017-08-13 DIAGNOSIS — R300 Dysuria: Secondary | ICD-10-CM

## 2017-08-13 DIAGNOSIS — Z8679 Personal history of other diseases of the circulatory system: Secondary | ICD-10-CM

## 2017-08-13 DIAGNOSIS — Z8659 Personal history of other mental and behavioral disorders: Secondary | ICD-10-CM

## 2017-08-13 DIAGNOSIS — M19042 Primary osteoarthritis, left hand: Secondary | ICD-10-CM | POA: Diagnosis not present

## 2017-08-13 DIAGNOSIS — Z79899 Other long term (current) drug therapy: Secondary | ICD-10-CM

## 2017-08-13 DIAGNOSIS — Z8709 Personal history of other diseases of the respiratory system: Secondary | ICD-10-CM | POA: Diagnosis not present

## 2017-08-13 DIAGNOSIS — H209 Unspecified iridocyclitis: Secondary | ICD-10-CM | POA: Diagnosis not present

## 2017-08-13 DIAGNOSIS — L089 Local infection of the skin and subcutaneous tissue, unspecified: Secondary | ICD-10-CM

## 2017-08-13 MED ORDER — MUPIROCIN 2 % EX OINT: 1.0000 "application " | TOPICAL_OINTMENT | Freq: Two times a day (BID) | CUTANEOUS | 3 refills | Status: DC

## 2017-08-13 NOTE — Progress Notes (Signed)
Office Visit Note   Patient: Kirsten Harper           Date of Birth: [birth year]           MRN: [id number] Visit Date: 08/13/2017              Requested by: Venia Carbon, MD [place], [place] [address] PCP: Venia Carbon, MD  Chief Complaint  Patient presents with  . Left 3rd Toe - Wound Check      HPI: Patient is a 72 year old woman who is seen for initial evaluation for left third toe.  She states that she stubbed her toe about 2 months ago the nail fell off she states that she did go to urgent care due to some drainage.  She was started on a prescription for doxycycline.  Past medical history positive for ankylosing spondylitis patient's medication includes Humira.  Assessment & Plan: Visit Diagnoses:  1. Abscess of third toe of left foot     Plan: Recommended Bactroban dressing changes twice a day she will complete her course of doxycycline we will follow-up in 3 weeks.  There is no sign of abscess or bony infection at this time.  Follow-Up Instructions: Return in about 3 weeks (around 09/03/2017).   Ortho Exam  Patient is alert, oriented, no adenopathy, well-dressed, normal affect, normal respiratory effort. Patient has a normal gait she has a good dorsalis pedis pulse she does have very mild rash on the dorsum of her foot but there is no cellulitis there is some mild venous stasis swelling.  Examination of the toe there was a few scabs on the third toe that were removed there is some healthy granulation tissue at the base there is no deep wound no drainage.  The toe is thin there is no sausage digit swelling.  Bactroban and a Band-Aid was applied.  Imaging: Xr Foot Complete Left  Result Date: 08/13/2017 3 view radiographs were reviewed of the left foot which shows arthritic changes across the MTP joints of the lesser toes there is decreased bone mineral density no lytic changes of the toes.0  No images are attached to the  encounter.  Labs: Lab Results  Component Value Date   HGBA1C 4.2 (L) 09/21/2016   REPTSTATUS 09/25/2016 FINAL 09/25/2016   REPTSTATUS 09/27/2016 FINAL 09/25/2016   GRAMSTAIN  09/25/2016    ABUNDANT WBC PRESENT,BOTH PMN AND MONONUCLEAR FEW GRAM POSITIVE COCCI IN PAIRS FEW GRAM VARIABLE ROD RARE SQUAMOUS EPITHELIAL CELLS PRESENT    CULT Consistent with normal respiratory flora. 09/25/2016   LABORGA NO GROWTH 2 DAYS 06/02/2014    '@LABSALLVALUES'$ (HGBA1)@  There is no height or weight on file to calculate BMI.  Orders:  Orders Placed This Encounter  Procedures  . XR Foot Complete Left   No orders of the defined types were placed in this encounter.    Procedures: No procedures performed  Clinical Data: No additional findings.  ROS:  All other systems negative, except as noted in the HPI. Review of Systems  Objective: Vital Signs: There were no vitals taken for this visit.  Specialty Comments:  No specialty comments available.  PMFS History: Patient Active Problem List   Diagnosis Date Noted  . RLS (restless legs syndrome) 04/25/2017  . Mood disorder ([place]) 04/08/2017  . Sensory loss 04/08/2017  . COPD (chronic obstructive pulmonary disease) ([place]) 04/08/2017  . Ankylosing spondylitis of lumbosacral region ([place]) 09/24/2016  . Major depressive disorder, recurrent severe without psychotic features (  [place]) 08/27/2016  . GAD (generalized anxiety disorder) 08/27/2016  . 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 Spondyloarthropathy HLA-B27 positive 06/06/2016  . Iritis 06/06/2016  . High risk medication use 06/06/2016  . Primary osteoarthritis of both hands 06/06/2016  . Smoker 06/06/2016  . COPD exacerbation ([place]) 04/10/2016  . Anxiety 04/10/2016  . Essential hypertension    Past Medical History:  Diagnosis Date  . Ankylosing spondylitis ([place])   . Anxiety   . Arthritis   . Chronic lower back pain   . CKD (chronic kidney disease), stage II     "related to the Ankylosing spondylitis" (04/10/2016)  . COPD (chronic obstructive pulmonary disease) ([place])   . Eye disease   . Hypertension   . PONV (postoperative nausea and vomiting)    "didn't bother me the last time I had it in ~ 2015"    Family History  Problem Relation Age of Onset  . Hypertension Mother   . Heart disease Sister   . Diabetes Sister   . Pulmonary fibrosis Brother   . Alzheimer's disease Brother     Past Surgical History:  Procedure Laterality Date  . BROW LIFT AND BLEPHAROPLASTY Bilateral ~ 2015  . CATARACT EXTRACTION W/ INTRAOCULAR LENS  IMPLANT, BILATERAL Bilateral 2000s  . MANDIBLE SURGERY  ~ 2000   "jaw had moved to left; broke my jaw in 5 places; put titanium in; stripped muscle from bone; wired jaw shut"  . TUBAL LIGATION  1979  . VAGINAL HYSTERECTOMY  1985   Social History   Occupational History  . Occupation: Sales promotion account executive for [employer]    Comment: Retired  Tobacco Use  . Smoking status: Former Smoker    Packs/day: 0.50    Years: 51.00    Pack years: 25.50    Types: Cigarettes  . Smokeless tobacco: Never Used  Substance and Sexual Activity  . Alcohol use: Yes    Alcohol/week: 0.0 oz  . Drug use: No  . Sexual activity: No

## 2017-08-15 ENCOUNTER — Encounter: Payer: Self-pay | Admitting: Family Medicine

## 2017-08-15 ENCOUNTER — Ambulatory Visit (INDEPENDENT_AMBULATORY_CARE_PROVIDER_SITE_OTHER): Payer: Medicare Other | Admitting: Family Medicine

## 2017-08-15 DIAGNOSIS — L02612 Cutaneous abscess of left foot: Secondary | ICD-10-CM | POA: Diagnosis not present

## 2017-08-15 DIAGNOSIS — L03032 Cellulitis of left toe: Secondary | ICD-10-CM

## 2017-08-15 MED ORDER — DOXYCYCLINE HYCLATE 100 MG PO TABS: 100.0000 mg | ORAL_TABLET | Freq: Two times a day (BID) | ORAL | 0 refills | Status: DC

## 2017-08-15 NOTE — Patient Instructions (Signed)
Keep taking doxy in the meantime.   Soak your foot and keep using bactroban.   Follow up with Dr. Sharol Given.   Take care.  Glad to see you.

## 2017-08-15 NOTE — Progress Notes (Signed)
Cellulitis seen at [facility] and then with eval by Dr. Sharol Given recently.  Started on doxy about 7 days ago.  Xrays prev done.  Per EMR:  3 view radiographs were reviewed of the left foot which shows arthritic changes across the MTP joints of the lesser toes there is decreased bone mineral density no lytic changes of the toes. No fevers.  She wanted her toe rechecked today.  No fevers.   Meds, vitals, and allergies reviewed.   ROS: Per HPI unless specifically indicated in ROS section   nad L foot with normal inspection except for L 3rd toe with local erythema but no abscess.  Scant discharge at the nail.  Rapid cap refill.  Normal DP pulse.

## 2017-08-17 DIAGNOSIS — L02612 Cutaneous abscess of left foot: Secondary | ICD-10-CM | POA: Insufficient documentation

## 2017-08-17 DIAGNOSIS — L03032 Cellulitis of left toe: Principal | ICD-10-CM

## 2017-08-17 NOTE — Assessment & Plan Note (Signed)
Cellulitis, but no abscess seen.   Keep taking doxy in the meantime, extended her rx Soak the foot and keep using bactroban.   Follow up with Dr. Sharol Given.   F/u prn o/w.  Routine cautions d/w pt.

## 2017-08-27 ENCOUNTER — Encounter (INDEPENDENT_AMBULATORY_CARE_PROVIDER_SITE_OTHER): Payer: Self-pay | Admitting: Orthopedic Surgery

## 2017-08-27 ENCOUNTER — Ambulatory Visit (INDEPENDENT_AMBULATORY_CARE_PROVIDER_SITE_OTHER): Payer: Medicare Other | Admitting: Orthopedic Surgery

## 2017-08-27 VITALS — Ht 67.5 in | Wt 184.5 lb

## 2017-08-27 DIAGNOSIS — L03032 Cellulitis of left toe: Secondary | ICD-10-CM

## 2017-08-27 NOTE — Progress Notes (Signed)
Office Visit Note   Patient: Kirsten Harper           Date of Birth: [birth year]           MRN: [id number] Visit Date: 08/27/2017              Requested by: Venia Carbon, MD [place], [place] [address] PCP: Venia Carbon, MD  Chief Complaint  Patient presents with  . Right Foot - Follow-up    Abscess left 3rd toe      HPI: Patient is a 72 year old woman who presents in follow-up for cellulitis left foot third toe.  She states she is completed 2 courses of antibiotics she has been using Bactroban ointment.  She has taken doxycycline 100 mg twice a day for a month states she still has tenderness redness over the third toe.  Assessment & Plan: Visit Diagnoses:  1. Paronychia of third toe, left     Plan: The nail was removed the nail fold was cleansed.  A sterile dressing was applied.  She will start Bactroban dressing changes tomorrow we will place her in a postoperative shoe.  Follow-Up Instructions: Return in about 1 week (around 09/03/2017).   Ortho Exam  Patient is alert, oriented, no adenopathy, well-dressed, normal affect, normal respiratory effort. Examination patient has an excellent dorsalis pedis and posterior tibial pulse.  There is no swelling of her toes she does have a paronychial infection of the left foot third toe the redness proximal to this area does resolve with compression.  After informed consent and sterile prepping patient underwent a digital block with 5 cc of 1% lidocaine plain the nail was removed without complications a sterile compressive dressing was applied.  Postoperative shoe start dressing changes tomorrow.  Imaging: No results found. No images are attached to the encounter.  Labs: Lab Results  Component Value Date   HGBA1C 4.2 (L) 09/21/2016   REPTSTATUS 09/25/2016 FINAL 09/25/2016   REPTSTATUS 09/27/2016 FINAL 09/25/2016   GRAMSTAIN  09/25/2016    ABUNDANT WBC PRESENT,BOTH PMN AND MONONUCLEAR FEW GRAM POSITIVE  COCCI IN PAIRS FEW GRAM VARIABLE ROD RARE SQUAMOUS EPITHELIAL CELLS PRESENT    CULT Consistent with normal respiratory flora. 09/25/2016   LABORGA NO GROWTH 2 DAYS 06/02/2014    _0 (HGBA1)@  Body mass index is 28.47 kg/m.  Orders:  No orders of the defined types were placed in this encounter.  No orders of the defined types were placed in this encounter.    Procedures: No procedures performed  Clinical Data: No additional findings.  ROS:  All other systems negative, except as noted in the HPI. Review of Systems  Objective: Vital Signs: Ht 5' 7.5" (1.715 m)   Wt 184 lb 8 oz (83.7 kg)   BMI 28.47 kg/m   Specialty Comments:  No specialty comments available.  PMFS History: Patient Active Problem List   Diagnosis Date Noted  . Abscess or cellulitis of toe, left 08/17/2017  . RLS (restless legs syndrome) 04/25/2017  . Mood disorder ([place]) 04/08/2017  . Sensory loss 04/08/2017  . COPD (chronic obstructive pulmonary disease) ([place]) 04/08/2017  . Ankylosing spondylitis of lumbosacral region ([place]) 09/24/2016  . Major depressive disorder, recurrent severe without psychotic features ([place]) 08/27/2016  . GAD (generalized anxiety disorder) 08/27/2016  . 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 Spondyloarthropathy HLA-B27 positive 06/06/2016  . Iritis 06/06/2016  . High risk medication use 06/06/2016  . Primary osteoarthritis of both hands 06/06/2016  .  Smoker 06/06/2016  . COPD exacerbation ([place]) 04/10/2016  . Anxiety 04/10/2016  . Essential hypertension    Past Medical History:  Diagnosis Date  . Ankylosing spondylitis ([place])   . Anxiety   . Arthritis   . Chronic lower back pain   . CKD (chronic kidney disease), stage II    "related to the Ankylosing spondylitis" (04/10/2016)  . COPD (chronic obstructive pulmonary disease) ([place])   . Eye disease   . Hypertension   . PONV (postoperative nausea and vomiting)    "didn't bother me the  last time I had it in ~ 2015"    Family History  Problem Relation Age of Onset  . Hypertension Mother   . Heart disease Sister   . Diabetes Sister   . Pulmonary fibrosis Brother   . Alzheimer's disease Brother     Past Surgical History:  Procedure Laterality Date  . BROW LIFT AND BLEPHAROPLASTY Bilateral ~ 2015  . CATARACT EXTRACTION W/ INTRAOCULAR LENS  IMPLANT, BILATERAL Bilateral 2000s  . MANDIBLE SURGERY  ~ 2000   "jaw had moved to left; broke my jaw in 5 places; put titanium in; stripped muscle from bone; wired jaw shut"  . TUBAL LIGATION  1979  . VAGINAL HYSTERECTOMY  1985   Social History   Occupational History  . Occupation: Sales promotion account executive for [employer]    Comment: Retired  Tobacco Use  . Smoking status: Former Smoker    Packs/day: 0.50    Years: 51.00    Pack years: 25.50    Types: Cigarettes  . Smokeless tobacco: Never Used  Substance and Sexual Activity  . Alcohol use: Yes    Alcohol/week: 0.0 oz  . Drug use: No  . Sexual activity: No

## 2017-08-28 ENCOUNTER — Ambulatory Visit: Payer: Self-pay | Admitting: Rheumatology

## 2017-09-04 ENCOUNTER — Encounter (INDEPENDENT_AMBULATORY_CARE_PROVIDER_SITE_OTHER): Payer: Self-pay | Admitting: Orthopedic Surgery

## 2017-09-04 ENCOUNTER — Ambulatory Visit (INDEPENDENT_AMBULATORY_CARE_PROVIDER_SITE_OTHER): Payer: Medicare Other | Admitting: Orthopedic Surgery

## 2017-09-04 VITALS — Ht 67.5 in | Wt 184.5 lb

## 2017-09-04 DIAGNOSIS — L03032 Cellulitis of left toe: Secondary | ICD-10-CM

## 2017-09-04 NOTE — Progress Notes (Signed)
Office Visit Note   Patient: Kirsten Harper           Date of Birth: June 03, 1946           MRN: [id number] Visit Date: 09/04/2017              Requested by: Venia Carbon, MD [place], [place] [address] PCP: Venia Carbon, MD  Chief Complaint  Patient presents with  . Right Foot - Follow-up      HPI: Patient presents in follow-up for paronychial infection left foot third toe.  The nail was removed 2 weeks ago.  She is been doing Bactroban dressing changes she is completed 2 courses of doxycycline she states the toe was still pink tender over the nail fold without swelling.  Assessment & Plan: Visit Diagnoses:  1. Paronychia of third toe, left     Plan: Recommended compression stockings or antifungal powder or cream she should use the powder or cream 3 times a day.  Patient has no swelling there is no tenderness to palpation around the toe the redness resolves with elevation and compression.  Feel that this is mostly a fungal reaction at this time.  Follow-Up Instructions: Return in about 4 weeks (around 10/02/2017).   Ortho Exam  Patient is alert, oriented, no adenopathy, well-dressed, normal affect, normal respiratory effort. Examination patient has a good pulse there is no sausage digit swelling of the toes she has some redness of the third toe however with elevation and compression the redness resolves.  Imaging: No results found. No images are attached to the encounter.  Labs: Lab Results  Component Value Date   HGBA1C 4.2 (L) 09/21/2016   REPTSTATUS 09/25/2016 FINAL 09/25/2016   REPTSTATUS 09/27/2016 FINAL 09/25/2016   GRAMSTAIN  09/25/2016    ABUNDANT WBC PRESENT,BOTH PMN AND MONONUCLEAR FEW GRAM POSITIVE COCCI IN PAIRS FEW GRAM VARIABLE ROD RARE SQUAMOUS EPITHELIAL CELLS PRESENT    CULT Consistent with normal respiratory flora. 09/25/2016   LABORGA NO GROWTH 2 DAYS 06/02/2014    '@LABSALLVALUES'$ (HGBA1)@  Body mass index is 28.47  kg/m.  Orders:  No orders of the defined types were placed in this encounter.  No orders of the defined types were placed in this encounter.    Procedures: No procedures performed  Clinical Data: No additional findings.  ROS:  All other systems negative, except as noted in the HPI. Review of Systems  Objective: Vital Signs: Ht 5' 7.5" (1.715 m)   Wt 184 lb 8 oz (83.7 kg)   BMI 28.47 kg/m   Specialty Comments:  No specialty comments available.  PMFS History: Patient Active Problem List   Diagnosis Date Noted  . Abscess or cellulitis of toe, left 08/17/2017  . RLS (restless legs syndrome) 04/25/2017  . Mood disorder ([place]) 04/08/2017  . Sensory loss 04/08/2017  . COPD (chronic obstructive pulmonary disease) ([place]) 04/08/2017  . Ankylosing spondylitis of lumbosacral region ([place]) 09/24/2016  . Major depressive disorder, recurrent severe without psychotic features ([place]) 08/27/2016  . GAD (generalized anxiety disorder) 08/27/2016  . 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 Spondyloarthropathy HLA-B27 positive 06/06/2016  . Iritis 06/06/2016  . High risk medication use 06/06/2016  . Primary osteoarthritis of both hands 06/06/2016  . Smoker 06/06/2016  . COPD exacerbation ([place]) 04/10/2016  . Anxiety 04/10/2016  . Essential hypertension    Past Medical History:  Diagnosis Date  . Ankylosing spondylitis ([place])   . Anxiety   . Arthritis   .  Chronic lower back pain   . CKD (chronic kidney disease), stage II    "related to the Ankylosing spondylitis" (04/10/2016)  . COPD (chronic obstructive pulmonary disease) ([place])   . Eye disease   . Hypertension   . PONV (postoperative nausea and vomiting)    "didn't bother me the last time I had it in ~ 2015"    Family History  Problem Relation Age of Onset  . Hypertension Mother   . Heart disease Sister   . Diabetes Sister   . Pulmonary fibrosis Brother   . Alzheimer's disease Brother     Past Surgical  History:  Procedure Laterality Date  . BROW LIFT AND BLEPHAROPLASTY Bilateral ~ 2015  . CATARACT EXTRACTION W/ INTRAOCULAR LENS  IMPLANT, BILATERAL Bilateral 2000s  . MANDIBLE SURGERY  ~ 2000   "jaw had moved to left; broke my jaw in 5 places; put titanium in; stripped muscle from bone; wired jaw shut"  . TUBAL LIGATION  1979  . VAGINAL HYSTERECTOMY  1985   Social History   Occupational History  . Occupation: Sales promotion account executive for [employer]    Comment: Retired  Tobacco Use  . Smoking status: Former Smoker    Packs/day: 0.50    Years: 51.00    Pack years: 25.50    Types: Cigarettes  . Smokeless tobacco: Never Used  Substance and Sexual Activity  . Alcohol use: Yes    Alcohol/week: 0.0 oz  . Drug use: No  . Sexual activity: No

## 2017-09-09 DIAGNOSIS — F410 Panic disorder [episodic paroxysmal anxiety] without agoraphobia: Secondary | ICD-10-CM | POA: Diagnosis not present

## 2017-09-09 DIAGNOSIS — Z79899 Other long term (current) drug therapy: Secondary | ICD-10-CM | POA: Diagnosis not present

## 2017-09-24 ENCOUNTER — Encounter (INDEPENDENT_AMBULATORY_CARE_PROVIDER_SITE_OTHER): Payer: Self-pay | Admitting: Orthopedic Surgery

## 2017-09-24 ENCOUNTER — Ambulatory Visit (INDEPENDENT_AMBULATORY_CARE_PROVIDER_SITE_OTHER): Payer: Self-pay | Admitting: Orthopedic Surgery

## 2017-09-24 DIAGNOSIS — L03032 Cellulitis of left toe: Secondary | ICD-10-CM

## 2017-09-24 NOTE — Progress Notes (Signed)
Office Visit Note   Patient: Kirsten Harper           Date of Birth: 1946/06/09           MRN: [id number] Visit Date: 09/24/2017              Requested by: Venia Carbon, MD [place], [place] [address] PCP: Venia Carbon, MD  No chief complaint on file.     HPI: Patient is a 72 year old woman with ankylosing spondylitis currently on who states she still has redness of the third toe.  Patient states she has soaked her toe on Clorox pure rubbing alcohol Listerine and multiple other toxic chemicals.  Assessment & Plan: Visit Diagnoses:  1. Paronychia of third toe, left     Plan: Recommended only using antibiotic ointment she should not use any of the toxic chemicals.  Recommended a stiff soled walking sneakers such as a new balance walking sneaker.  Follow-Up Instructions: Return in about 4 weeks (around 10/22/2017).   Ortho Exam  Patient is alert, oriented, no adenopathy, well-dressed, normal affect, normal respiratory effort. Examination patient has a good pulse she has good dorsiflexion of the ankle the third toe has dependent redness.  There is no swelling no cellulitis no tenderness to palpation.  Patient's toes are stiff with essentially no range of motion secondary to her ankylosing spondylitis.  Patient is wearing soft flip-flops sandals which are overloading the forefoot.  Most likely the cause of the increased redness in the third toe as well as the toxic soaks.  Imaging: No results found. No images are attached to the encounter.  Labs: Lab Results  Component Value Date   HGBA1C 4.2 (L) 09/21/2016   REPTSTATUS 09/25/2016 FINAL 09/25/2016   REPTSTATUS 09/27/2016 FINAL 09/25/2016   GRAMSTAIN  09/25/2016    ABUNDANT WBC PRESENT,BOTH PMN AND MONONUCLEAR FEW GRAM POSITIVE COCCI IN PAIRS FEW GRAM VARIABLE ROD RARE SQUAMOUS EPITHELIAL CELLS PRESENT    CULT Consistent with normal respiratory flora. 09/25/2016   LABORGA NO GROWTH 2 DAYS  06/02/2014    _0 (HGBA1)@  There is no height or weight on file to calculate BMI.  Orders:  No orders of the defined types were placed in this encounter.  No orders of the defined types were placed in this encounter.    Procedures: No procedures performed  Clinical Data: No additional findings.  ROS:  All other systems negative, except as noted in the HPI. Review of Systems  Objective: Vital Signs: There were no vitals taken for this visit.  Specialty Comments:  No specialty comments available.  PMFS History: Patient Active Problem List   Diagnosis Date Noted  . Abscess or cellulitis of toe, left 08/17/2017  . RLS (restless legs syndrome) 04/25/2017  . Mood disorder ([place]) 04/08/2017  . Sensory loss 04/08/2017  . COPD (chronic obstructive pulmonary disease) ([place]) 04/08/2017  . Ankylosing spondylitis of lumbosacral region ([place]) 09/24/2016  . Major depressive disorder, recurrent severe without psychotic features ([place]) 08/27/2016  . GAD (generalized anxiety disorder) 08/27/2016  . 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 Spondyloarthropathy HLA-B27 positive 06/06/2016  . Iritis 06/06/2016  . High risk medication use 06/06/2016  . Primary osteoarthritis of both hands 06/06/2016  . Smoker 06/06/2016  . COPD exacerbation ([place]) 04/10/2016  . Anxiety 04/10/2016  . Essential hypertension    Past Medical History:  Diagnosis Date  . Ankylosing spondylitis ([place])   . Anxiety   . Arthritis   .  Chronic lower back pain   . CKD (chronic kidney disease), stage II    "related to the Ankylosing spondylitis" (04/10/2016)  . COPD (chronic obstructive pulmonary disease) ([facility])   . Eye disease   . Hypertension   . PONV (postoperative nausea and vomiting)    "didn't bother me the last time I had it in ~ 2015"    Family History  Problem Relation Age of Onset  . Hypertension Mother   . Heart disease Sister   . Diabetes Sister   . Pulmonary  fibrosis Brother   . Alzheimer's disease Brother     Past Surgical History:  Procedure Laterality Date  . BROW LIFT AND BLEPHAROPLASTY Bilateral ~ 2015  . CATARACT EXTRACTION W/ INTRAOCULAR LENS  IMPLANT, BILATERAL Bilateral 2000s  . MANDIBLE SURGERY  ~ 2000   "jaw had moved to left; broke my jaw in 5 places; put titanium in; stripped muscle from bone; wired jaw shut"  . TUBAL LIGATION  1979  . VAGINAL HYSTERECTOMY  1985   Social History   Occupational History  . 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 Smoking status: Former Smoker    Packs/day: 0.50    Years: 51.00    Pack years: 25.50    Types: Cigarettes  . Smokeless tobacco: Never Used  Substance and Sexual Activity  . Alcohol use: Yes    Alcohol/week: 0.0 oz  . Drug use: No  . Sexual activity: No       

## 2017-09-25 ENCOUNTER — Other Ambulatory Visit: Payer: Self-pay | Admitting: Internal Medicine

## 2017-09-25 DIAGNOSIS — J449 Chronic obstructive pulmonary disease, unspecified: Secondary | ICD-10-CM

## 2017-09-25 NOTE — Progress Notes (Signed)
Office Visit Note  Patient: Kirsten Harper             Date of Birth: [birth year]           MRN: [id number]             PCP: Venia Carbon, MD Referring: Venia Carbon, MD Visit Date: 10/09/2017 Occupation: _0 @    Subjective:  Feet pain   History of Present Illness: Kirsten Harper is a 72 y.o. female history of ankylosing spondylitis and uveitis.  She has been off Humira since January due to infection in her left third toe.  She is also had bronchitis since then.  She has been seeing Dr. due to.  According to patient initially thought she had cellulitis and then is thinking its fungal infection currently.  Bronchitis resolved after treatment with prednisone.  She has some discomfort in her knee joints.  For which she takes Clinoril as needed.  He continues to have uveitis flare.  She has been having increased lower back pain and has difficulty sleeping at night due to that.  Activities of Daily Living:  Patient reports morning stiffness for 0 minutes.   Patient Denies nocturnal pain.  Difficulty dressing/grooming: Denies Difficulty climbing stairs: Reports Difficulty getting out of chair: Reports Difficulty using hands for taps, buttons, cutlery, and/or writing: Denies   Review of Systems  Constitutional: Positive for fatigue. Negative for night sweats, weight gain and weight loss.  HENT: Negative for mouth sores, trouble swallowing, trouble swallowing, mouth dryness and nose dryness.   Eyes: Positive for dryness. Negative for pain, redness and visual disturbance.  Respiratory: Negative for cough, shortness of breath and difficulty breathing.   Cardiovascular: Negative for chest pain, palpitations, hypertension, irregular heartbeat and swelling in legs/feet.  Gastrointestinal: Negative for abdominal pain, blood in stool, constipation and diarrhea.  Endocrine: Negative for increased urination.  Genitourinary: Negative for pelvic pain and vaginal dryness.    Musculoskeletal: Positive for morning stiffness. Negative for arthralgias, joint pain, joint swelling, myalgias, muscle weakness, muscle tenderness and myalgias.  Skin: Negative for color change, rash, hair loss, skin tightness, ulcers and sensitivity to sunlight.  Allergic/Immunologic: Negative for susceptible to infections.  Neurological: Negative for dizziness, headaches, memory loss, night sweats and weakness.  Hematological: Negative for bruising/bleeding tendency and swollen glands.  Psychiatric/Behavioral: Negative for depressed mood, confusion and sleep disturbance. The patient is not nervous/anxious.     PMFS History:  Patient Active Problem List   Diagnosis Date Noted  . Abscess or cellulitis of toe, left 08/17/2017  . RLS (restless legs syndrome) 04/25/2017  . Mood disorder ([place]) 04/08/2017  . Sensory loss 04/08/2017  . COPD (chronic obstructive pulmonary disease) ([place]) 04/08/2017  . Ankylosing spondylitis of lumbosacral region ([place]) 09/24/2016  . Major depressive disorder, recurrent severe without psychotic features ([place]) 08/27/2016  . GAD (generalized anxiety disorder) 08/27/2016  . 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 Spondyloarthropathy HLA-B27 positive 06/06/2016  . Iritis 06/06/2016  . High risk medication use 06/06/2016  . Primary osteoarthritis of both hands 06/06/2016  . Smoker 06/06/2016  . COPD exacerbation ([place]) 04/10/2016  . Anxiety 04/10/2016  . Essential hypertension     Past Medical History:  Diagnosis Date  . Ankylosing spondylitis ([place])   . Anxiety   . Arthritis   . Chronic lower back pain   . CKD (chronic kidney disease), stage II    "related to the Ankylosing spondylitis" (04/10/2016)  . COPD (chronic obstructive pulmonary disease) (  [place])   . Eye disease   . Hypertension   . PONV (postoperative nausea and vomiting)    "didn't bother me the last time I had it in ~ 2015"    Family History  Problem Relation Age of Onset   . Hypertension Mother   . Heart disease Sister   . Diabetes Sister   . Pulmonary fibrosis Brother   . Alzheimer's disease Brother    Past Surgical History:  Procedure Laterality Date  . BROW LIFT AND BLEPHAROPLASTY Bilateral ~ 2015  . CATARACT EXTRACTION W/ INTRAOCULAR LENS  IMPLANT, BILATERAL Bilateral 2000s  . MANDIBLE SURGERY  ~ 2000   "jaw had moved to left; broke my jaw in 5 places; put titanium in; stripped muscle from bone; wired jaw shut"  . TUBAL LIGATION  1979  . VAGINAL HYSTERECTOMY  1985   Social History   Social History Narrative   2 sons--not in area   Lives on property with her mother Kirsten Harper      Has living will   Would want one of her sons, Kirsten Harper, to be Media planner   Requests DNR   No tube feeds     Objective: Vital Signs: BP 121/78 (BP Location: Left Arm, Patient Position: Sitting, Cuff Size: Normal)   Pulse 75   Resp 16   Ht 5' 7.5" (1.715 m)   Wt 192 lb (87.1 kg)   BMI 29.63 kg/m    Physical Exam  Constitutional: She is oriented to person, place, and time. She appears well-developed and well-nourished.  HENT:  Head: Normocephalic and atraumatic.  Eyes: Conjunctivae and EOM are normal.  Mild conjunctival injection was noted.  Neck: Normal range of motion.  Cardiovascular: Normal rate, regular rhythm, normal heart sounds and intact distal pulses.  Pulmonary/Chest: Effort normal and breath sounds normal.  Abdominal: Soft. Bowel sounds are normal.  Lymphadenopathy:    She has no cervical adenopathy.  Neurological: She is alert and oriented to person, place, and time.  Skin: Skin is warm and dry. Capillary refill takes less than 2 seconds.  Psychiatric: She has a normal mood and affect. Her behavior is normal.  Nursing note and vitals reviewed.    Musculoskeletal Exam: C-spine thoracic lumbar spine limited range of motion with discomfort.  Shoulder joints elbow joints wrist joint MCPs were in good range of motion.  She has DIP thickening  without any synovitis.  Hip joints and knee joints are good range of motion.  She has some tenderness and redness on her left third toe which is painful.  CDAI Exam: No CDAI exam completed.    Investigation: No additional findings. CBC Latest Ref Rng & Units 07/23/2017 03/03/2017 11/25/2016  WBC 3.8 - 10.8 Thousand/uL 7.7 7.1 7.0  Hemoglobin 11.7 - 15.5 g/dL 14.5 14.9 14.3  Hematocrit 35.0 - 45.0 % 41.2 43.7 42.5  Platelets 140 - 400 Thousand/uL 255 225 250   CMP Latest Ref Rng & Units 07/23/2017 04/08/2017 03/03/2017  Glucose 65 - 99 mg/dL 98 - 104(H)  BUN 7 - 25 mg/dL 12 - 18  Creatinine 0.50 - 0.99 mg/dL 0.79 - 0.87  Sodium 135 - 146 mmol/L 141 - 142  Potassium 3.5 - 5.3 mmol/L 4.1 - 4.3  Chloride 98 - 110 mmol/L 107 - 104  CO2 20 - 32 mmol/L 28 - 27  Calcium 8.6 - 10.4 mg/dL 9.6 - 10.6(H)  Total Protein 6.1 - 8.1 g/dL 6.9 7.2 7.0  Total Bilirubin 0.2 - 1.2 mg/dL 1.1 -  1.1  Alkaline Phos 33 - 130 U/L - - 74  AST 10 - 35 U/L 13 - 17  ALT 6 - 29 U/L 13 - 17    Imaging: No results found.  Speciality Comments: No specialty comments available.    Procedures:  No procedures performed Allergies: Amoxapine and related   Assessment / Plan:     Visit Diagnoses: Ankylosing spondylitis of lumbosacral region [facility]) no active synovitis on examination.  She has mild SI joint tenderness.  She has been off Humira due to left third toe infection.  I will hold off Humira until she gets clearance and the infection is treated.  Uveitis -she is followed up by Dr. Dwana Melena  High risk medication use - Humira 40 mg sq q wk which is on hold due to infection.  TB gold 6//7/18CBC/CMP: 07/23/17  Infection of toe - Doxycycline, Referred to Dr. Sharol Given.  She has been under care of Dr. Sharol Given.  He initially felt that she had cellulitis but now according to his notes she had fungal infection.  He also extracted her toenail.  She will follow-up with Dr. due to regarding ongoing pain and discomfort.  Primary  osteoarthritis of both hands: She has some osteoarthritic changes in her hands.  Other medical problems are listed as follows:  Chronic obstructive pulmonary disease, unspecified COPD type ([place])  Essential hypertension  Former smoker  History of anxiety    Orders: No orders of the defined types were placed in this encounter.  No orders of the defined types were placed in this encounter.   Face-to-face time spent with patient was 20 minutes.  Greater than 50% of time was spent in counseling and coordination of care.  Follow-Up Instructions: Return in about 3 months (around 01/09/2018) for Ankylosing spondylitis,Uveitis.   Bo Merino, MD  Note - This record has been created using Editor, commissioning.  Chart creation errors have been sought, but may not always  have been located. Such creation errors do not reflect on  the standard of medical care.

## 2017-09-25 NOTE — Telephone Encounter (Signed)
Copied from [place] [id number]. Topic: Quick Communication - Rx Refill/Question >> Sep 25, 2017 12:38 PM Waylan Rocher, [place] wrote: Medication: ventolin inhaler (patient says she does not want proventil and is out of inhaler)  Has the patient contacted their pharmacy? Yes.    (Agent: If no, request that the patient contact the pharmacy for the refill.)  Preferred Pharmacy (with phone number or street name): [id number] [place], [place] - [address] [place] [address] Phone: [phone] Fax: [phone]  Agent: Please be advised that RX refills may take up to 3 business days. We ask that you follow-up with your pharmacy.

## 2017-09-26 MED ORDER — ALBUTEROL SULFATE HFA 108 (90 BASE) MCG/ACT IN AERS: 2.0000 | INHALATION_SPRAY | RESPIRATORY_TRACT | 0 refills | Status: DC | PRN

## 2017-09-26 NOTE — Telephone Encounter (Signed)
Approved:okay refill #1 x 0 and we can discuss at upcoming visit

## 2017-09-26 NOTE — Telephone Encounter (Signed)
Gentry Fitz NP phone note on 07/14/17; suggest if recurrent use of albuterol need f/u with PCP; ;may need symbicort. Pt has 6 mth f/u scheduled  On 10/27/17.Please advise.

## 2017-09-30 ENCOUNTER — Encounter: Payer: Self-pay | Admitting: Internal Medicine

## 2017-09-30 ENCOUNTER — Ambulatory Visit (INDEPENDENT_AMBULATORY_CARE_PROVIDER_SITE_OTHER): Payer: Medicare Other | Admitting: Internal Medicine

## 2017-09-30 VITALS — BP 126/74 | HR 71 | Temp 98.3°F | Wt 187.2 lb

## 2017-09-30 DIAGNOSIS — J441 Chronic obstructive pulmonary disease with (acute) exacerbation: Secondary | ICD-10-CM

## 2017-09-30 MED ORDER — PREDNISONE 20 MG PO TABS: 40.0000 mg | ORAL_TABLET | Freq: Every day | ORAL | 0 refills | Status: DC

## 2017-09-30 NOTE — Patient Instructions (Signed)
Please ask the pharmacist about a spacer for the inhaler.

## 2017-09-30 NOTE — Assessment & Plan Note (Signed)
Mostly chronic bronchitis Now her cough is related to bronchospasm from smoking and quitting Hopefully shouldn't need increased medications overall Will give 6 days of prednisone Albuterol prn ---hopefully won't need as often (discussed using spacer) Discussed that cough may persist for a while due to the smoking thing

## 2017-09-30 NOTE — Progress Notes (Signed)
Subjective:    Patient ID: Kirsten Harper, female    DOB: 15-Jun-1946, 72 y.o.   MRN: [id number]  HPI Here due to persistent cough  On humira for ankylosing spondylitis/uveitis Ongoing toe problems--- Dr Sharol Given, here, urgent care twice Dr Sharol Given removed the toenail---looks a lot better Now finally able to wear shoes  Hasn't taken the humira since January Afraid it is keeping her from healing with infections  Having head congestion Drainage causing cough when lying down Cough with sputum Wheezing when lying down mostly Using albuterol often--like 4-5 times a day (it controls the cough)  No fever Some SOB---mostly with activity  Had restarted smoking about 6 months ago Just stopped with patch about a week ago  Current Outpatient Medications on File Prior to Visit  Medication Sig Dispense Refill  . albuterol (PROVENTIL HFA;VENTOLIN HFA) 108 (90 Base) MCG/ACT inhaler Inhale 2 puffs into the lungs every 4 (four) hours as needed for wheezing or shortness of breath (cough, shortness of breath or wheezing.). 1 Inhaler 0  . clonazePAM (KLONOPIN) 0.5 MG tablet Take 0.5 mg by mouth 2 (two) times daily.    . dorzolamide-timolol (COSOPT) 22.3-6.8 MG/ML ophthalmic solution Place 1 drop into both eyes daily.     Marland Kitchen losartan (COZAAR) 100 MG tablet Take 100 mg by mouth daily.     Marland Kitchen LOTEMAX 0.5 % ophthalmic suspension Place 1 drop into both eyes daily.     Marland Kitchen rOPINIRole (REQUIP) 1 MG tablet Take 3 tablets (3 mg total) by mouth at bedtime. 90 tablet 11  . sulindac (CLINORIL) 150 MG tablet Take 1 tablet (150 mg total) by mouth 2 (two) times daily as needed. 60 tablet 2  . Adalimumab (HUMIRA PEN) 40 MG/0.4ML PNKT Inject 40 mg into the skin every 14 (fourteen) days. (Patient not taking: Reported on 09/30/2017) 3 each 0   No current facility-administered medications on file prior to visit.     Allergies  Allergen Reactions  . Amoxapine And Related     Patient reports it did not work for her.      Past Medical History:  Diagnosis Date  . Ankylosing spondylitis ([place])   . Anxiety   . Arthritis   . Chronic lower back pain   . CKD (chronic kidney disease), stage II    "related to the Ankylosing spondylitis" (04/10/2016)  . COPD (chronic obstructive pulmonary disease) ([place])   . Eye disease   . Hypertension   . PONV (postoperative nausea and vomiting)    "didn't bother me the last time I had it in ~ 2015"    Past Surgical History:  Procedure Laterality Date  . BROW LIFT AND BLEPHAROPLASTY Bilateral ~ 2015  . CATARACT EXTRACTION W/ INTRAOCULAR LENS  IMPLANT, BILATERAL Bilateral 2000s  . MANDIBLE SURGERY  ~ 2000   "jaw had moved to left; broke my jaw in 5 places; put titanium in; stripped muscle from bone; wired jaw shut"  . TUBAL LIGATION  1979  . VAGINAL HYSTERECTOMY  1985    Family History  Problem Relation Age of Onset  . Hypertension Mother   . Heart disease Sister   . Diabetes Sister   . Pulmonary fibrosis Brother   . Alzheimer's disease Brother     Social History   Socioeconomic History  . Marital status: Divorced    Spouse name: Not on file  . Number of children: 2  . Years of education: Not on file  . Highest education level: Not on file  Social Needs  . Financial resource strain: Not on file  . Food insecurity - worry: Not on file  . Food insecurity - inability: Not on file  . Transportation needs - medical: Not on file  . Transportation needs - non-medical: Not on file  Occupational History  . Occupation: Sales promotion account executive for [employer]    Comment: Retired  Tobacco Use  . Smoking status: Former Smoker    Packs/day: 0.50    Years: 51.00    Pack years: 25.50    Types: Cigarettes  . Smokeless tobacco: Never Used  Substance and Sexual Activity  . Alcohol use: Yes    Alcohol/week: 0.0 oz  . Drug use: No  . Sexual activity: No  Other Topics Concern  . Not on file  Social History Narrative   2 sons--not in area   Lives on property with her  mother [name]      Has living will   Would want one of her sons, [name], to be Media planner   Requests DNR   No tube feeds   Review of Systems Appetite is fine No heartburn or reflux symptoms    Objective:   Physical Exam  HENT:  Mouth/Throat: Oropharynx is clear and moist. No oropharyngeal exudate.  Pulmonary/Chest: Effort normal and breath sounds normal. No respiratory distress. She has no wheezes. She has no rales.  Tight cough          Assessment & Plan:

## 2017-10-02 ENCOUNTER — Ambulatory Visit (INDEPENDENT_AMBULATORY_CARE_PROVIDER_SITE_OTHER): Payer: Self-pay | Admitting: Orthopedic Surgery

## 2017-10-09 ENCOUNTER — Encounter: Payer: Self-pay | Admitting: Physician Assistant

## 2017-10-09 ENCOUNTER — Encounter: Payer: Self-pay | Admitting: Podiatry

## 2017-10-09 ENCOUNTER — Ambulatory Visit (INDEPENDENT_AMBULATORY_CARE_PROVIDER_SITE_OTHER): Payer: Medicare Other | Admitting: Rheumatology

## 2017-10-09 ENCOUNTER — Ambulatory Visit (INDEPENDENT_AMBULATORY_CARE_PROVIDER_SITE_OTHER): Payer: Medicare Other | Admitting: Podiatry

## 2017-10-09 VITALS — BP 121/78 | HR 75 | Resp 16 | Ht 67.5 in | Wt 192.0 lb

## 2017-10-09 DIAGNOSIS — Z87891 Personal history of nicotine dependence: Secondary | ICD-10-CM

## 2017-10-09 DIAGNOSIS — M19042 Primary osteoarthritis, left hand: Secondary | ICD-10-CM | POA: Diagnosis not present

## 2017-10-09 DIAGNOSIS — L089 Local infection of the skin and subcutaneous tissue, unspecified: Secondary | ICD-10-CM

## 2017-10-09 DIAGNOSIS — M79676 Pain in unspecified toe(s): Secondary | ICD-10-CM

## 2017-10-09 DIAGNOSIS — I10 Essential (primary) hypertension: Secondary | ICD-10-CM | POA: Diagnosis not present

## 2017-10-09 DIAGNOSIS — M2041 Other hammer toe(s) (acquired), right foot: Secondary | ICD-10-CM

## 2017-10-09 DIAGNOSIS — L600 Ingrowing nail: Secondary | ICD-10-CM

## 2017-10-09 DIAGNOSIS — M2042 Other hammer toe(s) (acquired), left foot: Secondary | ICD-10-CM

## 2017-10-09 DIAGNOSIS — M457 Ankylosing spondylitis of lumbosacral region: Secondary | ICD-10-CM | POA: Diagnosis not present

## 2017-10-09 DIAGNOSIS — Z8659 Personal history of other mental and behavioral disorders: Secondary | ICD-10-CM | POA: Diagnosis not present

## 2017-10-09 DIAGNOSIS — J449 Chronic obstructive pulmonary disease, unspecified: Secondary | ICD-10-CM

## 2017-10-09 DIAGNOSIS — M19041 Primary osteoarthritis, right hand: Secondary | ICD-10-CM | POA: Diagnosis not present

## 2017-10-09 DIAGNOSIS — H209 Unspecified iridocyclitis: Secondary | ICD-10-CM

## 2017-10-09 DIAGNOSIS — Z79899 Other long term (current) drug therapy: Secondary | ICD-10-CM

## 2017-10-09 NOTE — Progress Notes (Signed)
Subjective:  Patient ID: Kirsten Harper, female    DOB: 1945/12/21,  MRN: [id number]  Chief Complaint  Patient presents with  . Nail Problem    3rd toenail infected x 6 months Has been seen by several doctors (including [facility])and no one can seem to fix it or tell her what it is    72 y.o. female presents with the above complaint.  Reports chronic issues of the left third toenail times 6 months.  States that it started about December 1.  Reports pain and redness from the nail.  Has seen multiple providers for this issue and has had the nail removed without complete resolution of symptoms.  Did state that after she had a nail removed by Dr. due to she did notice some relief for a short period of time.  States that the redness came back.  Is concerned about the chronicity of the red area with pain.  Patient has history of ankylosing spondylitis previously was taking Humira not presently taking.   Denies nausea vomiting fever chills  Past Medical History:  Diagnosis Date  . Ankylosing spondylitis ([place])   . Anxiety   . Arthritis   . Chronic lower back pain   . CKD (chronic kidney disease), stage II    "related to the Ankylosing spondylitis" (04/10/2016)  . COPD (chronic obstructive pulmonary disease) ([place])   . Eye disease   . Hypertension   . PONV (postoperative nausea and vomiting)    "didn't bother me the last time I had it in ~ 2015"   Past Surgical History:  Procedure Laterality Date  . BROW LIFT AND BLEPHAROPLASTY Bilateral ~ 2015  . CATARACT EXTRACTION W/ INTRAOCULAR LENS  IMPLANT, BILATERAL Bilateral 2000s  . MANDIBLE SURGERY  ~ 2000   "jaw had moved to left; broke my jaw in 5 places; put titanium in; stripped muscle from bone; wired jaw shut"  . TUBAL LIGATION  1979  . VAGINAL HYSTERECTOMY  1985    Current Outpatient Medications:  .  Adalimumab (HUMIRA PEN) 40 MG/0.4ML PNKT, Inject 40 mg into the skin every 14 (fourteen) days. (Patient not taking: Reported on 09/30/2017), Disp: 3  each, Rfl: 0 .  albuterol (PROVENTIL HFA;VENTOLIN HFA) 108 (90 Base) MCG/ACT inhaler, Inhale 2 puffs into the lungs every 4 (four) hours as needed for wheezing or shortness of breath (cough, shortness of breath or wheezing.)., Disp: 1 Inhaler, Rfl: 0 .  clonazePAM (KLONOPIN) 0.5 MG tablet, Take 0.5 mg by mouth 2 (two) times daily., Disp: , Rfl:  .  dorzolamide-timolol (COSOPT) 22.3-6.8 MG/ML ophthalmic solution, Place 1 drop into both eyes daily. , Disp: , Rfl:  .  losartan (COZAAR) 100 MG tablet, Take 100 mg by mouth daily. , Disp: , Rfl:  .  LOTEMAX 0.5 % ophthalmic suspension, Place 1 drop into both eyes daily. , Disp: , Rfl:  .  predniSONE (DELTASONE) 20 MG tablet, Take 2 tablets (40 mg total) by mouth daily. For 3 days, then 1 daily for 3 days. (Patient not taking: Reported on 10/09/2017), Disp: 9 tablet, Rfl: 0 .  rOPINIRole (REQUIP) 1 MG tablet, Take 3 tablets (3 mg total) by mouth at bedtime., Disp: 90 tablet, Rfl: 11 .  sulindac (CLINORIL) 150 MG tablet, Take 1 tablet (150 mg total) by mouth 2 (two) times daily as needed., Disp: 60 tablet, Rfl: 2  Allergies  Allergen Reactions  . Amoxapine And Related     Patient reports it did not work for her.  Review of Systems Objective:  There were no vitals filed for this visit. General AA&O x3. Normal mood and affect.  Vascular Dorsalis pedis and posterior tibial pulses  present 2+ bilaterally  Capillary refill normal to all digits. Pedal hair growth normal.  Neurologic Epicritic sensation grossly present.  Dermatologic No open lesions. Interspaces clear of maceration. Left third toenail with erythema the proximal nail fold.  No frank drainage.  No purulence.  No ascending sialitis.  Orthopedic: MMT 5/5 in dorsiflexion, plantarflexion, inversion, and eversion. Normal joint ROM without pain or crepitus.   Assessment & Plan:  Patient was evaluated and treated and all questions answered.  Ingrown toenail left third toe -Discussed  patient current treatment options including removal of the nail.  Advised that the erythema should resolve if the issue is new from the ingrown nail.  If it does not would like to pursue x-rays and further evaluate causes. -Patient agreed to proceed with removal of the toenail today.  Educated on postoperative care including soaking of the nail.   Procedure: Excision of Ingrown Toenail Location: Left 3rd toe  Anesthesia: Lidocaine 1% plain; 1.39mL and Marcaine 0.5% plain; 1.36mL, digital block. Skin Prep: Betadine. Dressing: Silvadene; telfa; dry, sterile, compression dressing. Technique: Following skin prep, th the left third toenail was loosened with a Freer and avulsed with a hemostat.  All nail corners explored for evidence of ingrown nail.  The area was irrigated and dressed with Silvadene and dry sterile compression dressing. Disposition: Patient tolerated procedure well. Patient to return in 2 weeks for follow-up.   Return in about 2 weeks (around 10/23/2017) for nail check, left 3rd toenail removed 10/09/17.

## 2017-10-09 NOTE — Patient Instructions (Signed)
Place 1/4 cup of epsom salts in a quart of warm tap water.  Submerge your foot or feet in the solution and soak for 20 minutes.  This soak should be done twice a day.  Next, remove your foot or feet from solution, blot dry the affected area. Apply ointment and cover if instructed by your doctor.   IF YOUR SKIN BECOMES IRRITATED WHILE USING THESE INSTRUCTIONS, IT IS OKAY TO SWITCH TO  [employer] VINEGAR AND WATER.  As another alternative soak, you may use antibacterial soap and water.  Monitor for any signs/symptoms of infection. Call the office immediately if any occur or go directly to the emergency room. Call with any questions/concerns.  

## 2017-10-22 ENCOUNTER — Ambulatory Visit (INDEPENDENT_AMBULATORY_CARE_PROVIDER_SITE_OTHER): Payer: Self-pay | Admitting: Orthopedic Surgery

## 2017-10-23 ENCOUNTER — Ambulatory Visit (INDEPENDENT_AMBULATORY_CARE_PROVIDER_SITE_OTHER): Payer: Self-pay | Admitting: Podiatry

## 2017-10-23 DIAGNOSIS — M79676 Pain in unspecified toe(s): Secondary | ICD-10-CM

## 2017-10-23 DIAGNOSIS — L600 Ingrowing nail: Secondary | ICD-10-CM

## 2017-10-27 ENCOUNTER — Ambulatory Visit (INDEPENDENT_AMBULATORY_CARE_PROVIDER_SITE_OTHER): Payer: Medicare Other | Admitting: Internal Medicine

## 2017-10-27 ENCOUNTER — Encounter: Payer: Self-pay | Admitting: Internal Medicine

## 2017-10-27 DIAGNOSIS — F39 Unspecified mood [affective] disorder: Secondary | ICD-10-CM

## 2017-10-27 DIAGNOSIS — M457 Ankylosing spondylitis of lumbosacral region: Secondary | ICD-10-CM

## 2017-10-27 DIAGNOSIS — J449 Chronic obstructive pulmonary disease, unspecified: Secondary | ICD-10-CM | POA: Diagnosis not present

## 2017-10-27 NOTE — Progress Notes (Signed)
Subjective:    Patient ID: Kirsten Harper, female    DOB: [birth year], 72 y.o.   MRN: [id number]  HPI Here for follow up of medical conditions  Still with some cough--especially if she leans over Has stayed away from the cigarettes Breathing is okay Did improve with the prednisone at last visit  Still having trouble with the left 3rd toe Went to podiatrist---intermittent redness (infection vs inflammation) Continues to hold the humira---does have some back pain but clinoril does help  Sleep still terrible Still uses the clonazepam and ropinirole [place] Will sleep during the day better Ongoing RLS issues-- requip really works  Current Outpatient Medications on File Prior to Visit  Medication Sig Dispense Refill  . albuterol (PROVENTIL HFA;VENTOLIN HFA) 108 (90 Base) MCG/ACT inhaler Inhale 2 puffs into the lungs every 4 (four) hours as needed for wheezing or shortness of breath (cough, shortness of breath or wheezing.). 1 Inhaler 0  . clonazePAM (KLONOPIN) 0.5 MG tablet Take 0.5 mg by mouth 2 (two) times daily.    . dorzolamide-timolol (COSOPT) 22.3-6.8 MG/ML ophthalmic solution Place 1 drop into both eyes daily.     Marland Kitchen losartan (COZAAR) 100 MG tablet Take 100 mg by mouth daily.     Marland Kitchen LOTEMAX 0.5 % ophthalmic suspension Place 1 drop into both eyes daily.     Marland Kitchen rOPINIRole (REQUIP) 1 MG tablet Take 3 tablets (3 mg total) by mouth at bedtime. 90 tablet 11  . sulindac (CLINORIL) 150 MG tablet Take 1 tablet (150 mg total) by mouth 2 (two) times daily as needed. 60 tablet 2  . Adalimumab (HUMIRA PEN) 40 MG/0.4ML PNKT Inject 40 mg into the skin every 14 (fourteen) days. (Patient not taking: Reported on 10/27/2017) 3 each 0   No current facility-administered medications on file prior to visit.     Allergies  Allergen Reactions  . Amoxapine And Related     Patient reports it did not work for her.     Past Medical History:    Diagnosis Date  . Ankylosing spondylitis ([place])   . Anxiety   . Arthritis   . Chronic lower back pain   . CKD (chronic kidney disease), stage II    "related to the Ankylosing spondylitis" (04/10/2016)  . COPD (chronic obstructive pulmonary disease) ([place])   . Eye disease   . Hypertension   . PONV (postoperative nausea and vomiting)    "didn't bother me the last time I had it in ~ 2015"    Past Surgical History:  Procedure Laterality Date  . BROW LIFT AND BLEPHAROPLASTY Bilateral ~ 2015  . CATARACT EXTRACTION W/ INTRAOCULAR LENS  IMPLANT, BILATERAL Bilateral 2000s  . MANDIBLE SURGERY  ~ 2000   "jaw had moved to left; broke my jaw in 5 places; put titanium in; stripped muscle from bone; wired jaw shut"  . TUBAL LIGATION  1979  . VAGINAL HYSTERECTOMY  1985    Family History  Problem Relation Age of Onset  . Hypertension Mother   . Heart disease Sister   . Diabetes Sister   . Pulmonary fibrosis Brother   . Alzheimer's disease Brother     Social History   Socioeconomic History  . Marital status: Divorced    Spouse name: Not on file  . Number of children: 2  . Years of education: Not on file  . Highest education level: Not on file  Occupational History  . Occupation: Sales promotion account executive  for [employer]    Comment: Retired  Scientific laboratory technician  . Financial resource strain: Not on file  . Food insecurity:    Worry: Not on file    Inability: Not on file  . Transportation needs:    Medical: Not on file    Non-medical: Not on file  Tobacco Use  . Smoking status: Former Smoker    Packs/day: 0.50    Years: 51.00    Pack years: 25.50    Types: Cigarettes  . Smokeless tobacco: Never Used  Substance and Sexual Activity  . Alcohol use: Yes    Alcohol/week: 0.0 oz  . Drug use: No  . Sexual activity: Never  Lifestyle  . Physical activity:    Days per week: Not on file    Minutes per session: Not on file  . Stress: Not on file  Relationships  . Social connections:    Talks on  phone: Not on file    Gets together: Not on file    Attends religious service: Not on file    Active member of club or organization: Not on file    Attends meetings of clubs or organizations: Not on file    Relationship status: Not on file  . Intimate partner violence:    Fear of current or ex partner: Not on file    Emotionally abused: Not on file    Physically abused: Not on file    Forced sexual activity: Not on file  Other Topics Concern  . Not on file  Social History Narrative   2 sons--not in area   Lives on property with her mother [name]      Has living will   Would want one of her sons, [name], to be Media planner   Requests DNR   No tube feeds   Review of Systems Some knee pain Appetite is good Weight has gone up as expected off the cigarettes    Objective:   Physical Exam  Constitutional: No distress.  Pulmonary/Chest: Effort normal and breath sounds normal. No respiratory distress. She has no wheezes. She has no rales.  Musculoskeletal:  Mild redness in distal left 3rd toe--not hot or with significant tenderness          Assessment & Plan:

## 2017-10-27 NOTE — Assessment & Plan Note (Signed)
Has improved Off the cigarettes now Work on healthy eating with the weight gain

## 2017-10-27 NOTE — Patient Instructions (Signed)
DASH Eating Plan DASH stands for "Dietary Approaches to Stop Hypertension." The DASH eating plan is a healthy eating plan that has been shown to reduce high blood pressure (hypertension). It may also reduce your risk for type 2 diabetes, heart disease, and stroke. The DASH eating plan may also help with weight loss. What are tips for following this plan? General guidelines  Avoid eating more than 2,300 mg (milligrams) of salt (sodium) a day. If you have hypertension, you may need to reduce your sodium intake to 1,500 mg a day.  Limit alcohol intake to no more than 1 drink a day for nonpregnant women and 2 drinks a day for men. One drink equals 12 oz of beer, 5 oz of wine, or 1 oz of hard liquor.  Work with your health care provider to maintain a healthy body weight or to lose weight. Ask what an ideal weight is for you.  Get at least 30 minutes of exercise that causes your heart to beat faster (aerobic exercise) most days of the week. Activities may include walking, swimming, or biking.  Work with your health care provider or diet and nutrition specialist (dietitian) to adjust your eating plan to your individual calorie needs. Reading food labels  Check food labels for the amount of sodium per serving. Choose foods with less than 5 percent of the Daily Value of sodium. Generally, foods with less than 300 mg of sodium per serving fit into this eating plan.  To find whole grains, look for the word "whole" as the first word in the ingredient list. Shopping  Buy products labeled as "low-sodium" or "no salt added."  Buy fresh foods. Avoid canned foods and premade or frozen meals. Cooking  Avoid adding salt when cooking. Use salt-free seasonings or herbs instead of table salt or sea salt. Check with your health care provider or pharmacist before using salt substitutes.  Do not fry foods. Cook foods using healthy methods such as baking, boiling, grilling, and broiling instead.  Cook with  heart-healthy oils, such as olive, canola, soybean, or sunflower oil. Meal planning   Eat a balanced diet that includes: ? 5 or more servings of fruits and vegetables each day. At each meal, try to fill half of your plate with fruits and vegetables. ? Up to 6-8 servings of whole grains each day. ? Less than 6 oz of lean meat, poultry, or fish each day. A 3-oz serving of meat is about the same size as a deck of cards. One egg equals 1 oz. ? 2 servings of low-fat dairy each day. ? A serving of nuts, seeds, or beans 5 times each week. ? Heart-healthy fats. Healthy fats called Omega-3 fatty acids are found in foods such as flaxseeds and coldwater fish, like sardines, salmon, and mackerel.  Limit how much you eat of the following: ? Canned or prepackaged foods. ? Food that is high in trans fat, such as fried foods. ? Food that is high in saturated fat, such as fatty meat. ? Sweets, desserts, sugary drinks, and other foods with added sugar. ? Full-fat dairy products.  Do not salt foods before eating.  Try to eat at least 2 vegetarian meals each week.  Eat more home-cooked food and less restaurant, buffet, and fast food.  When eating at a restaurant, ask that your food be prepared with less salt or no salt, if possible. What foods are recommended? The items listed may not be a complete list. Talk with your dietitian about what   dietary choices are best for you. Grains Whole-grain or whole-wheat bread. Whole-grain or whole-wheat pasta. Brown rice. Oatmeal. Quinoa. Bulgur. Whole-grain and low-sodium cereals. Pita bread. Low-fat, low-sodium crackers. Whole-wheat flour tortillas. Vegetables Fresh or frozen vegetables (raw, steamed, roasted, or grilled). Low-sodium or reduced-sodium tomato and vegetable juice. Low-sodium or reduced-sodium tomato sauce and tomato paste. Low-sodium or reduced-sodium canned vegetables. Fruits All fresh, dried, or frozen fruit. Canned fruit in natural juice (without  added sugar). Meat and other protein foods Skinless chicken or turkey. Ground chicken or turkey. Pork with fat trimmed off. Fish and seafood. Egg whites. Dried beans, peas, or lentils. Unsalted nuts, nut butters, and seeds. Unsalted canned beans. Lean cuts of beef with fat trimmed off. Low-sodium, lean deli meat. Dairy Low-fat (1%) or fat-free (skim) milk. Fat-free, low-fat, or reduced-fat cheeses. Nonfat, low-sodium ricotta or cottage cheese. Low-fat or nonfat yogurt. Low-fat, low-sodium cheese. Fats and oils Soft margarine without trans fats. Vegetable oil. Low-fat, reduced-fat, or light mayonnaise and salad dressings (reduced-sodium). Canola, safflower, olive, soybean, and sunflower oils. Avocado. Seasoning and other foods Herbs. Spices. Seasoning mixes without salt. Unsalted popcorn and pretzels. Fat-free sweets. What foods are not recommended? The items listed may not be a complete list. Talk with your dietitian about what dietary choices are best for you. Grains Baked goods made with fat, such as croissants, muffins, or some breads. Dry pasta or rice meal packs. Vegetables Creamed or fried vegetables. Vegetables in a cheese sauce. Regular canned vegetables (not low-sodium or reduced-sodium). Regular canned tomato sauce and paste (not low-sodium or reduced-sodium). Regular tomato and vegetable juice (not low-sodium or reduced-sodium). Pickles. Olives. Fruits Canned fruit in a light or heavy syrup. Fried fruit. Fruit in cream or butter sauce. Meat and other protein foods Fatty cuts of meat. Ribs. Fried meat. Bacon. Sausage. Bologna and other processed lunch meats. Salami. Fatback. Hotdogs. Bratwurst. Salted nuts and seeds. Canned beans with added salt. Canned or smoked fish. Whole eggs or egg yolks. Chicken or turkey with skin. Dairy Whole or 2% milk, cream, and half-and-half. Whole or full-fat cream cheese. Whole-fat or sweetened yogurt. Full-fat cheese. Nondairy creamers. Whipped toppings.  Processed cheese and cheese spreads. Fats and oils Butter. Stick margarine. Lard. Shortening. Ghee. Bacon fat. Tropical oils, such as coconut, palm kernel, or palm oil. Seasoning and other foods Salted popcorn and pretzels. Onion salt, garlic salt, seasoned salt, table salt, and sea salt. Worcestershire sauce. Tartar sauce. Barbecue sauce. Teriyaki sauce. Soy sauce, including reduced-sodium. Steak sauce. Canned and packaged gravies. Fish sauce. Oyster sauce. Cocktail sauce. Horseradish that you find on the shelf. Ketchup. Mustard. Meat flavorings and tenderizers. Bouillon cubes. Hot sauce and Tabasco sauce. Premade or packaged marinades. Premade or packaged taco seasonings. Relishes. Regular salad dressings. Where to find more information:  National Heart, Lung, and Blood Institute: www.nhlbi.nih.gov  American Heart Association: www.heart.org Summary  The DASH eating plan is a healthy eating plan that has been shown to reduce high blood pressure (hypertension). It may also reduce your risk for type 2 diabetes, heart disease, and stroke.  With the DASH eating plan, you should limit salt (sodium) intake to 2,300 mg a day. If you have hypertension, you may need to reduce your sodium intake to 1,500 mg a day.  When on the DASH eating plan, aim to eat more fresh fruits and vegetables, whole grains, lean proteins, low-fat dairy, and heart-healthy fats.  Work with your health care provider or diet and nutrition specialist (dietitian) to adjust your eating plan to your individual   calorie needs. This information is not intended to replace advice given to you by your health care provider. Make sure you discuss any questions you have with your health care provider. Document Released: 06/20/2011 Document Revised: 06/24/2016 Document Reviewed: 06/24/2016 Elsevier Interactive Patient Education  2018 Elsevier Inc.  

## 2017-10-27 NOTE — Assessment & Plan Note (Addendum)
Doing better now Clonazepam only --usually bid

## 2017-10-27 NOTE — Assessment & Plan Note (Signed)
Advised that I think it is okay for her to resume the humira

## 2017-10-28 ENCOUNTER — Telehealth: Payer: Self-pay | Admitting: Rheumatology

## 2017-10-28 NOTE — Telephone Encounter (Signed)
Mickel Baas from [facility] called stating they sent a fax last week requesting refill of Humira.  The prescription needs a new signature and date.  Please call if you have any questions.  [phone] Opt [phone]

## 2017-10-28 NOTE — Telephone Encounter (Signed)
Last Visit: 10/09/17 Next visit due June 2019. Message sent to the front to schedule patient  Labs: 07/23/17 WNL TB Gold: 12/30/16 Neg   Left message to advise patient we need updated labs.   Okay to refill per Dr. Estanislado Pandy.

## 2017-10-30 ENCOUNTER — Other Ambulatory Visit: Payer: Self-pay | Admitting: Internal Medicine

## 2017-10-30 DIAGNOSIS — J449 Chronic obstructive pulmonary disease, unspecified: Secondary | ICD-10-CM

## 2017-11-06 DIAGNOSIS — M459 Ankylosing spondylitis of unspecified sites in spine: Secondary | ICD-10-CM | POA: Diagnosis not present

## 2017-11-06 DIAGNOSIS — J159 Unspecified bacterial pneumonia: Secondary | ICD-10-CM | POA: Diagnosis not present

## 2017-11-06 DIAGNOSIS — I10 Essential (primary) hypertension: Secondary | ICD-10-CM | POA: Diagnosis not present

## 2017-11-06 DIAGNOSIS — R0602 Shortness of breath: Secondary | ICD-10-CM | POA: Diagnosis not present

## 2017-11-09 DIAGNOSIS — R0602 Shortness of breath: Secondary | ICD-10-CM | POA: Diagnosis not present

## 2017-11-09 DIAGNOSIS — J159 Unspecified bacterial pneumonia: Secondary | ICD-10-CM | POA: Diagnosis not present

## 2017-11-09 DIAGNOSIS — J441 Chronic obstructive pulmonary disease with (acute) exacerbation: Secondary | ICD-10-CM | POA: Diagnosis not present

## 2017-11-10 NOTE — Progress Notes (Signed)
  Subjective:  Patient ID: Kirsten Harper, female    DOB: 06-Jul-1946,  MRN: [id number]  Chief Complaint  Patient presents with  . nail check    F/U L 3rd toenail Pt. stated," not sure if toe is better or not, it's still red, swollen and painful; 5/10 sharp pain." tx: epsom salt soaking and neosporin   72 y.o. female returns for the above complaint.  States she is unsure of the nail bed or not because it is still swollen and causing some pain.  Has been using Epson salt and is warm  Objective:   General AA&O x3. Normal mood and affect.  Vascular Foot warm and well perfused with good capillary refill.  Neurologic Sensation grossly intact.  Dermatologic Nail avulsion site healing well without drainage or erythema. Nail bed with overlying soft crust. Left intact. No signs of local infection.  Orthopedic: No tenderness to palpation of the toe.   Assessment & Plan:  Patient was evaluated and treated and all questions answered.  S/p Ingrown Toenail Excision, l 3rd toe. -Healing well without issue. -Discussed return precautions. -F/u PRN

## 2017-11-20 ENCOUNTER — Ambulatory Visit: Payer: Self-pay | Admitting: Podiatry

## 2017-12-19 NOTE — Progress Notes (Signed)
Office Visit Note  Patient: Kirsten Harper             Date of Birth: 1946-02-18           MRN: [id number]             PCP: Venia Carbon, MD Referring: Venia Carbon, MD Visit Date: 12/23/2017 Occupation: '@GUAROCC'$ @    Subjective:  Bilateral knee pain   History of Present Illness: Kirsten Harper is a 72 y.o. female with history of ankylosing spondylitis, uveitis, and osteoarthritis.  She has been off of Humira for several weeks and has had several interruptions in her injections due to continued delayed healing of the left 3rd toe ingrown nail.  Over the last several months she has been evaluated for left third ingrown toenail by several physicians.  She was seen in urgent care, by Dr. Sharol Given, and Dr. March Rummage.  At her last visit with Dr. March Rummage on 10/23/2017 the ingrown toenail was healing well.  She would like to restart on Humira.  She is been having intermittent discomfort in her lower back.  She took Clinoril PRN about 2 weeks ago, which improved her pain.  She states she had rib cage pain several weeks ago as well, which has since resolved.  She has been having increased pain in bilateral knee joints. She denies any knee joint swelling.  She states the left knee gave out on her recently.  She denies any mechanical symptoms.  She states she has not been able to be as active due to the knee pain. She has noticed increased joint stiffness in bilateral knee joints.  She continues to have blurry vision in bilateral eyes as well as occasional redness.  She does not feel like Humira has helped manage uveitis.  She will be following up with her ophthalmologist in July.    Activities of Daily Living:  Patient reports morning stiffness for   all day.   Patient Reports nocturnal pain.  Difficulty dressing/grooming: Denies Difficulty climbing stairs: Reports Difficulty getting out of chair: Reports Difficulty using hands for taps, buttons, cutlery, and/or writing: Denies   Review of Systems    Constitutional: Positive for fatigue. Negative for fever.  HENT: Negative for ear pain, mouth sores, mouth dryness and nose dryness.   Eyes: Negative for pain, visual disturbance and dryness.  Respiratory: Negative for cough, hemoptysis, shortness of breath and difficulty breathing.   Cardiovascular: Negative for chest pain, palpitations, hypertension and swelling in legs/feet.  Gastrointestinal: Negative for blood in stool, constipation and diarrhea.  Endocrine: Negative for increased urination.  Genitourinary: Negative for difficulty urinating and painful urination.  Musculoskeletal: Positive for arthralgias, joint pain, myalgias, muscle weakness and myalgias. Negative for joint swelling, morning stiffness and muscle tenderness.  Skin: Negative for color change, pallor, rash, hair loss, nodules/bumps, skin tightness, ulcers and sensitivity to sunlight.  Allergic/Immunologic: Negative for susceptible to infections.  Neurological: Negative for dizziness, numbness and headaches.  Hematological: Negative for swollen glands.  Psychiatric/Behavioral: Positive for depressed mood and sleep disturbance. The patient is not nervous/anxious.     PMFS History:  Patient Active Problem List   Diagnosis Date Noted  . Abscess or cellulitis of toe, left 08/17/2017  . RLS (restless legs syndrome) 04/25/2017  . Mood disorder ([place]) 04/08/2017  . Sensory loss 04/08/2017  . COPD (chronic obstructive pulmonary disease) ([place]) 04/08/2017  . Ankylosing spondylitis of lumbosacral region ([place]) 09/24/2016  . GAD (generalized anxiety disorder) 08/27/2016  . 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 Spondyloarthropathy HLA-B27 positive 06/06/2016  . Iritis 06/06/2016  . High risk medication use 06/06/2016  . Primary osteoarthritis of both hands 06/06/2016  . Smoker 06/06/2016  . COPD exacerbation ([place]) 04/10/2016  . Anxiety 04/10/2016  . Essential hypertension     Past Medical History:   Diagnosis Date  . Ankylosing spondylitis ([place])   . Anxiety   . Arthritis   . Chronic lower back pain   . CKD (chronic kidney disease), stage II    "related to the Ankylosing spondylitis" (04/10/2016)  . COPD (chronic obstructive pulmonary disease) ([place])   . Eye disease   . Hypertension   . PONV (postoperative nausea and vomiting)    "didn't bother me the last time I had it in ~ 2015"    Family History  Problem Relation Age of Onset  . Hypertension Mother   . Heart disease Sister   . Diabetes Sister   . Pulmonary fibrosis Brother   . Alzheimer's disease Brother    Past Surgical History:  Procedure Laterality Date  . BROW LIFT AND BLEPHAROPLASTY Bilateral ~ 2015  . CATARACT EXTRACTION W/ INTRAOCULAR LENS  IMPLANT, BILATERAL Bilateral 2000s  . MANDIBLE SURGERY  ~ 2000   "jaw had moved to left; broke my jaw in 5 places; put titanium in; stripped muscle from bone; wired jaw shut"  . TUBAL LIGATION  1979  . VAGINAL HYSTERECTOMY  1985   Social History   Social History Narrative   2 sons--not in area   Lives on property with her mother [name]      Has living will   Would want one of her sons, [name], to be Media planner   Requests DNR   No tube feeds     Objective: Vital Signs: BP 140/86 (BP Location: Right Arm, Patient Position: Sitting, Cuff Size: Normal)   Pulse 81   Ht 5' 7.5" (1.715 m)   Wt 190 lb (86.2 kg)   BMI 29.32 kg/m    Physical Exam  Constitutional: She is oriented to person, place, and time. She appears well-developed and well-nourished.  HENT:  Head: Normocephalic and atraumatic.  Eyes: Conjunctivae and EOM are normal.  Neck: Normal range of motion.  Cardiovascular: Normal rate, regular rhythm, normal heart sounds and intact distal pulses.  Pulmonary/Chest: Effort normal and breath sounds normal.  Abdominal: Soft. Bowel sounds are normal.  Lymphadenopathy:    She has no cervical adenopathy.  Neurological: She is alert and oriented to person,  place, and time.  Skin: Skin is warm and dry. Capillary refill takes less than 2 seconds.  Psychiatric: She has a normal mood and affect. Her behavior is normal.  Nursing note and vitals reviewed.    Musculoskeletal Exam: C-spine good ROM.  Thoracic and lumbar spine limited ROM.  No midline spinal tenderness or SI joint tenderness.  Shoulder joints, elbow joints, wrist joints, MCPs, PIPs, and DIPs good ROM with no synovitis.  Hip joints, knee joints, ankle joints, MTPs, PIPs, and DIPs good ROM with no synovitis.  No tenderness of trochanteric bursa bilaterally. No warmth or effusion of knee joints.  Bilateral knee crepitus.  PIP and DIP synovial thickening consistent with osteoarthritis of bilateral feet.    CDAI Exam: No CDAI exam completed.    Investigation: No additional findings.  CBC Latest Ref Rng & Units 07/23/2017 03/03/2017 11/25/2016  WBC 3.8 - 10.8 Thousand/uL 7.7 7.1 7.0  Hemoglobin 11.7 - 15.5 g/dL 14.5 14.9 14.3  Hematocrit  35.0 - 45.0 % 41.2 43.7 42.5  Platelets 140 - 400 Thousand/uL 255 225 250   CMP Latest Ref Rng & Units 07/23/2017 04/08/2017 03/03/2017  Glucose 65 - 99 mg/dL 98 - 104(H)  BUN 7 - 25 mg/dL 12 - 18  Creatinine 0.50 - 0.99 mg/dL 0.79 - 0.87  Sodium 135 - 146 mmol/L 141 - 142  Potassium 3.5 - 5.3 mmol/L 4.1 - 4.3  Chloride 98 - 110 mmol/L 107 - 104  CO2 20 - 32 mmol/L 28 - 27  Calcium 8.6 - 10.4 mg/dL 9.6 - 10.6(H)  Total Protein 6.1 - 8.1 g/dL 6.9 7.2 7.0  Total Bilirubin 0.2 - 1.2 mg/dL 1.1 - 1.1  Alkaline Phos 33 - 130 U/L - - 74  AST 10 - 35 U/L 13 - 17  ALT 6 - 29 U/L 13 - 17    Imaging: Xr Knee 3 View Left  Result Date: 12/23/2017 Moderate to severe medial compartment narrowing with lateral osteophytes and intercondylar osteophytes were noted.  No chondrocalcinosis was noted.  Moderate patellofemoral narrowing was noted. Impression: These findings are consistent with moderate osteoarthritis and moderate chondromalacia patella.  Xr Knee 3 View  Right  Result Date: 12/23/2017 Moderate to severe medial compartment narrowing with intercondylar osteophytes were noted.  No chondrocalcinosis were noted.  Severe patellofemoral narrowing was noted. Impression: These findings are consistent with moderate to severe osteoarthritis and severe chondromalacia patella.   Speciality Comments: No specialty comments available.    Procedures:  No procedures performed Allergies: Amoxapine and related   Assessment / Plan:     Visit Diagnoses: Ankylosing spondylitis of lumbosacral region [facility]): She has no midline spinal tenderness or SI joint tenderness. She has limited ROM of the lumbar spine.  She has had many interruptions and injecting Humira subcutaneously over the past few months.  Her last injection was several weeks ago.  She would like to restart on Humira since her ingrown toenail has healed.  She was last seen by Dr. March Rummage on 10/23/2017 who reported that the ingrown toenail was healing well.  HLA B 27 Positive   Uveitis: She is been having increased blurry vision as well as redness in her bilateral eyes.  She has follow-up appointment with her ophthalmologist in July 2019.  She does not feels that Humira has been controlling her uveitis.  High risk medication use - Humira 40 mg sq q wk  -CBC, CMP, TB gold ordered today.  She will return in September and every 3 months for CBC and CMP to monitor for drug toxicity. plan: CBC with Differential/Platelet, COMPLETE METABOLIC PANEL WITH GFR, QuantiFERON-TB Gold Plus  Infection of toe - Resolved.  She saw Dr. Sharol Given and Dr. March Rummage.  She last saw Dr. March Rummage on 10/23/2017 who reported that her left third ingrown toenail was healing well.  She is no longer on antibiotics.    Primary osteoarthritis of both hands: She has mild osteoarthritic changes in bilateral hands.  She has no synovitis.  She has no discomfort or tenderness at this time.   Primary osteoarthritis of both knees: She has been having increased  discomfort in bilateral knee joints.  She has bilateral knee crepitus.  She has no warmth or effusion on exam.  She declined a cortisone injection today in the office.  She has previously had a cortisone injections in the past which provided temporary relief.  X-rays of bilateral knees were obtained today.  She is given a handout of knee exercises that she can  perform at home.  She is also given a prescription for Voltaren gel which she can apply topically 3 times daily.  We will apply for Visco supplementation of bilateral knee joints.    Chronic pain of both knees -she has been having increased pain in bilateral knee joints.  She is bilateral knee crepitus.  Her left knee has been giving out on her.  X-rays of her bilateral knees were obtained today.  She would like to apply for Visco supplementation.  She declined a cortisone injection today in the office.  She is given a prescription for Voltaren gel.  She can also perform the exercises at home.  Plan: XR KNEE 3 VIEW LEFT, XR KNEE 3 VIEW RIGHT   Other medical conditions are listed as follows:  Chronic obstructive pulmonary disease, unspecified COPD type ([place])  Essential hypertension  Former smoker  History of anxiety    Orders: Orders Placed This Encounter  Procedures  . XR KNEE 3 VIEW LEFT  . XR KNEE 3 VIEW RIGHT  . CBC with Differential/Platelet  . COMPLETE METABOLIC PANEL WITH GFR  . QuantiFERON-TB Gold Plus   Meds ordered this encounter  Medications  . diclofenac sodium (VOLTAREN) 1 % GEL    Sig: Apply 3 grams to 3 large joints up to 3 times daily    Dispense:  3 Tube    Refill:  3    Face-to-face time spent with patient was 30 minutes.> 50% of time was spent in counseling and coordination of care.  Follow-Up Instructions: Return in about 5 months (around 05/25/2018) for Ankylosing Spondylitis, Osteoarthritis, Uveitis.   Ofilia Neas, PA-C  Note - This record has been created using Dragon software.  Chart creation  errors have been sought, but may not always  have been located. Such creation errors do not reflect on  the standard of medical care.

## 2017-12-22 DIAGNOSIS — F330 Major depressive disorder, recurrent, mild: Secondary | ICD-10-CM | POA: Diagnosis not present

## 2017-12-22 DIAGNOSIS — Z79899 Other long term (current) drug therapy: Secondary | ICD-10-CM | POA: Diagnosis not present

## 2017-12-23 ENCOUNTER — Ambulatory Visit (INDEPENDENT_AMBULATORY_CARE_PROVIDER_SITE_OTHER): Payer: Medicare Other | Admitting: Physician Assistant

## 2017-12-23 ENCOUNTER — Ambulatory Visit (INDEPENDENT_AMBULATORY_CARE_PROVIDER_SITE_OTHER): Payer: Medicare Other

## 2017-12-23 ENCOUNTER — Encounter: Payer: Self-pay | Admitting: Physician Assistant

## 2017-12-23 VITALS — BP 140/86 | HR 81 | Ht 67.5 in | Wt 190.0 lb

## 2017-12-23 DIAGNOSIS — Z79899 Other long term (current) drug therapy: Secondary | ICD-10-CM | POA: Diagnosis not present

## 2017-12-23 DIAGNOSIS — M25562 Pain in left knee: Secondary | ICD-10-CM

## 2017-12-23 DIAGNOSIS — H209 Unspecified iridocyclitis: Secondary | ICD-10-CM | POA: Diagnosis not present

## 2017-12-23 DIAGNOSIS — G8929 Other chronic pain: Secondary | ICD-10-CM | POA: Diagnosis not present

## 2017-12-23 DIAGNOSIS — J449 Chronic obstructive pulmonary disease, unspecified: Secondary | ICD-10-CM | POA: Diagnosis not present

## 2017-12-23 DIAGNOSIS — M25561 Pain in right knee: Secondary | ICD-10-CM

## 2017-12-23 DIAGNOSIS — M457 Ankylosing spondylitis of lumbosacral region: Secondary | ICD-10-CM

## 2017-12-23 DIAGNOSIS — Z87891 Personal history of nicotine dependence: Secondary | ICD-10-CM | POA: Diagnosis not present

## 2017-12-23 DIAGNOSIS — M19041 Primary osteoarthritis, right hand: Secondary | ICD-10-CM | POA: Diagnosis not present

## 2017-12-23 DIAGNOSIS — M170 Bilateral primary osteoarthritis of knee: Secondary | ICD-10-CM | POA: Diagnosis not present

## 2017-12-23 DIAGNOSIS — M19042 Primary osteoarthritis, left hand: Secondary | ICD-10-CM

## 2017-12-23 DIAGNOSIS — M4690 Unspecified inflammatory spondylopathy, site unspecified: Secondary | ICD-10-CM | POA: Diagnosis not present

## 2017-12-23 DIAGNOSIS — I10 Essential (primary) hypertension: Secondary | ICD-10-CM

## 2017-12-23 DIAGNOSIS — Z8659 Personal history of other mental and behavioral disorders: Secondary | ICD-10-CM | POA: Diagnosis not present

## 2017-12-23 DIAGNOSIS — L089 Local infection of the skin and subcutaneous tissue, unspecified: Secondary | ICD-10-CM | POA: Diagnosis not present

## 2017-12-23 DIAGNOSIS — M47819 Spondylosis without myelopathy or radiculopathy, site unspecified: Secondary | ICD-10-CM

## 2017-12-23 MED ORDER — DICLOFENAC SODIUM 1 % TD GEL: TRANSDERMAL | 3 refills | Status: DC

## 2017-12-23 NOTE — Patient Instructions (Addendum)

## 2017-12-24 NOTE — Progress Notes (Signed)
WNLs

## 2017-12-25 ENCOUNTER — Telehealth: Payer: Self-pay | Admitting: Rheumatology

## 2017-12-25 ENCOUNTER — Encounter (INDEPENDENT_AMBULATORY_CARE_PROVIDER_SITE_OTHER): Payer: Self-pay | Admitting: Radiology

## 2017-12-25 LAB — CBC WITH DIFFERENTIAL/PLATELET
BASOS ABS: 38 {cells}/uL (ref 0–200)
BASOS PCT: 0.5 %
EOS ABS: 228 {cells}/uL (ref 15–500)
Eosinophils Relative: 3 %
HCT: 39.1 % (ref 35.0–45.0)
HEMOGLOBIN: 13.6 g/dL (ref 11.7–15.5)
Lymphs Abs: 1816 cells/uL (ref 850–3900)
MCH: 31.9 pg (ref 27.0–33.0)
MCHC: 34.8 g/dL (ref 32.0–36.0)
MCV: 91.8 fL (ref 80.0–100.0)
MONOS PCT: 8.8 %
MPV: 10 fL (ref 7.5–12.5)
NEUTROS ABS: 4849 {cells}/uL (ref 1500–7800)
Neutrophils Relative %: 63.8 %
Platelets: 222 10*3/uL (ref 140–400)
RBC: 4.26 10*6/uL (ref 3.80–5.10)
RDW: 12.4 % (ref 11.0–15.0)
Total Lymphocyte: 23.9 %
WBC mixed population: 669 cells/uL (ref 200–950)
WBC: 7.6 10*3/uL (ref 3.8–10.8)

## 2017-12-25 LAB — COMPLETE METABOLIC PANEL WITH GFR
AG Ratio: 1.8 (calc) (ref 1.0–2.5)
ALBUMIN MSPROF: 4.5 g/dL (ref 3.6–5.1)
ALKALINE PHOSPHATASE (APISO): 90 U/L (ref 33–130)
ALT: 13 U/L (ref 6–29)
AST: 15 U/L (ref 10–35)
BILIRUBIN TOTAL: 1.3 mg/dL — AB (ref 0.2–1.2)
BUN: 14 mg/dL (ref 7–25)
CHLORIDE: 102 mmol/L (ref 98–110)
CO2: 29 mmol/L (ref 20–32)
Calcium: 9.2 mg/dL (ref 8.6–10.4)
Creat: 0.79 mg/dL (ref 0.60–0.93)
GFR, Est African American: 88 mL/min/{1.73_m2} (ref >=60)
GFR, Est Non African American: 76 mL/min/{1.73_m2} (ref >=60)
GLUCOSE: 91 mg/dL (ref 65–99)
Globulin: 2.5 g/dL (calc) (ref 1.9–3.7)
Potassium: 4.2 mmol/L (ref 3.5–5.3)
Sodium: 139 mmol/L (ref 135–146)
Total Protein: 7 g/dL (ref 6.1–8.1)

## 2017-12-25 LAB — QUANTIFERON-TB GOLD PLUS
NIL: 0.02 IU/mL
QuantiFERON-TB Gold Plus: NEGATIVE
TB1-NIL: 0 IU/mL
TB2-NIL: 0 IU/mL

## 2017-12-25 MED ORDER — ADALIMUMAB 40 MG/0.4ML ~~LOC~~ AJKT: 40.0000 mg | AUTO-INJECTOR | SUBCUTANEOUS | 0 refills | Status: DC

## 2017-12-25 NOTE — Progress Notes (Unsigned)
Per Lovena Le, order bilateral gel injections.

## 2017-12-25 NOTE — Telephone Encounter (Signed)
Patient advised her lab results are normal and TB gold is negative from 12/24/17.  Last Visit: 12/23/17 Next Visit: 05/27/18  Okay to refill Humira per Dr. Estanislado Pandy  Faxed prescription to [facility]

## 2017-12-25 NOTE — Telephone Encounter (Signed)
Patient left a voicmail stating she was returning your call.

## 2017-12-29 ENCOUNTER — Telehealth (INDEPENDENT_AMBULATORY_CARE_PROVIDER_SITE_OTHER): Payer: Self-pay

## 2017-12-29 ENCOUNTER — Telehealth: Payer: Self-pay

## 2017-12-29 NOTE — Telephone Encounter (Signed)
Submitted application online for Monovisc, bilateral knee 

## 2017-12-29 NOTE — Progress Notes (Signed)
Noted  

## 2017-12-29 NOTE — Telephone Encounter (Signed)
-----   Message from [place] May, RT sent at 12/23/2017  5:01 PM EDT ----- Regarding: RE: visco Please send this as a telephone call message in the patient's chart so we can track it.  Thanks so much! Abigail Butts  ----- Message ----- From: Earnestine Mealing, CMA Sent: 12/23/2017   4:59 PM To: April Jackson, [place] Subject: visco                                          Please apply for visco for bilateral knees, per Lovena Le. Thanks!

## 2017-12-29 NOTE — Telephone Encounter (Signed)
Noted  

## 2018-01-05 ENCOUNTER — Encounter (INDEPENDENT_AMBULATORY_CARE_PROVIDER_SITE_OTHER): Payer: Self-pay | Admitting: Radiology

## 2018-01-05 NOTE — Progress Notes (Unsigned)
Please call patient and sched a visit for bilateral Monovisc injections. Kirsten Harper/Kirsten Harper patient.  Thanks!  No auth needed for Monovisc, both insurances should cover all costs for patient.  Buy and Bill.

## 2018-01-13 NOTE — Progress Notes (Signed)
LMOM for patient to call and schedule bilateral Monovisc injections with Dr. Estanislado Pandy

## 2018-01-20 DIAGNOSIS — H35033 Hypertensive retinopathy, bilateral: Secondary | ICD-10-CM | POA: Diagnosis not present

## 2018-01-20 DIAGNOSIS — H209 Unspecified iridocyclitis: Secondary | ICD-10-CM | POA: Diagnosis not present

## 2018-01-20 DIAGNOSIS — H0233 Blepharochalasis right eye, unspecified eyelid: Secondary | ICD-10-CM | POA: Diagnosis not present

## 2018-01-20 DIAGNOSIS — Z961 Presence of intraocular lens: Secondary | ICD-10-CM | POA: Diagnosis not present

## 2018-01-20 DIAGNOSIS — H35371 Puckering of macula, right eye: Secondary | ICD-10-CM | POA: Diagnosis not present

## 2018-01-23 ENCOUNTER — Ambulatory Visit (INDEPENDENT_AMBULATORY_CARE_PROVIDER_SITE_OTHER): Payer: Medicare Other | Admitting: Physician Assistant

## 2018-01-23 DIAGNOSIS — M1711 Unilateral primary osteoarthritis, right knee: Secondary | ICD-10-CM | POA: Diagnosis not present

## 2018-01-23 MED ORDER — HYALURONAN 88 MG/4ML IX SOSY
88.0000 mg | PREFILLED_SYRINGE | INTRA_ARTICULAR | Status: AC | PRN
  Administered 2018-01-23: 88 mg via INTRA_ARTICULAR

## 2018-01-23 MED ORDER — LIDOCAINE HCL 1 % IJ SOLN
1.5000 mL | INTRAMUSCULAR | Status: AC | PRN
  Administered 2018-01-23: 1.5 mL

## 2018-01-23 NOTE — Progress Notes (Signed)
   Procedure Note  Patient: Kirsten Harper             Date of Birth: [birth year]           MRN: [id number]             Visit Date: 01/23/2018  Procedures: Visit Diagnoses: Primary osteoarthritis of right knee Monovisc right knee, B/B Large Joint Inj: R knee on 01/23/2018 9:27 AM Indications: pain Details: 22 G 1.5 in needle, medial approach  Arthrogram: No  Medications: 88 mg Hyaluronan 88 MG/4ML; 1.5 mL lidocaine 1 % Aspirate: 0 mL Outcome: tolerated well, no immediate complications Procedure, treatment alternatives, risks and benefits explained, specific risks discussed. Consent was given by the patient. Immediately prior to procedure a time out was called to verify the correct patient, procedure, equipment, support staff and site/side marked as required. Patient was prepped and draped in the usual sterile fashion.     Patient tolerated the procedure well.    Hazel Sams, PA-C

## 2018-01-29 ENCOUNTER — Ambulatory Visit: Payer: Self-pay | Admitting: Physician Assistant

## 2018-01-30 ENCOUNTER — Ambulatory Visit (INDEPENDENT_AMBULATORY_CARE_PROVIDER_SITE_OTHER): Payer: Medicare Other | Admitting: Physician Assistant

## 2018-01-30 DIAGNOSIS — M1712 Unilateral primary osteoarthritis, left knee: Secondary | ICD-10-CM | POA: Diagnosis not present

## 2018-01-30 MED ORDER — LIDOCAINE HCL 1 % IJ SOLN
1.5000 mL | INTRAMUSCULAR | Status: AC | PRN
  Administered 2018-01-30: 1.5 mL

## 2018-01-30 MED ORDER — HYALURONAN 88 MG/4ML IX SOSY
88.0000 mg | PREFILLED_SYRINGE | INTRA_ARTICULAR | Status: AC | PRN
Start: 2018-01-30 — End: 2018-01-30
  Administered 2018-01-30: 88 mg via INTRA_ARTICULAR

## 2018-01-30 NOTE — Progress Notes (Signed)
   Procedure Note  Patient: Kirsten Harper             Date of Birth: 15-Jan-1946           MRN: [id number]             Visit Date: 01/30/2018  Procedures: Visit Diagnoses: Primary osteoarthritis of left knee Monovisc B/B left knee Large Joint Inj: L knee on 01/30/2018 8:29 AM Indications: pain Details: 22 G 1.5 in needle, medial approach  Arthrogram: No  Medications: 88 mg Hyaluronan 88 MG/4ML; 1.5 mL lidocaine 1 % Aspirate: 0 mL Outcome: tolerated well, no immediate complications Procedure, treatment alternatives, risks and benefits explained, specific risks discussed. Consent was given by the patient. Immediately prior to procedure a time out was called to verify the correct patient, procedure, equipment, support staff and site/side marked as required. Patient was prepped and draped in the usual sterile fashion.     Patient tolerated the procedure well.   Hazel Sams, PA-C

## 2018-02-06 ENCOUNTER — Telehealth: Payer: Self-pay | Admitting: Pharmacy Technician

## 2018-02-06 NOTE — Telephone Encounter (Signed)
Received renewal application for Humira from [employer]. Completed office portion and spoke with patient about required information. (income information and pt signature) Patient will come in next week (7/29-8/2)to sign form and bring required info.  2:54 PM Beatriz Chancellor, CPhT

## 2018-02-12 NOTE — Telephone Encounter (Signed)
Spoke to patient, she will stop by the office today to bring tax information and sign [employer] forms for Humira.  10:04 AM Kirsten Harper, CPhT

## 2018-02-16 NOTE — Telephone Encounter (Signed)
Patient called in to say she received pt forms from [employer] to submit, and wanted to confirm that forms we submitted last week were the same. I advised patient that the forms were faxed in on Friday, 02/13/18 and that I will confirm receipt and see if patient needs to turn in any other forms. Patient also wanted to know if she was due for labs. Patient had labs drawn in June, so she will be due for labs in September. Patient had no other questions or concerns at this time.  9:03 AM Beatriz Chancellor, CPhT

## 2018-02-25 NOTE — Telephone Encounter (Signed)
Received approval for Patient Assistance from [facility] [employer] for Humira. Patient has been approved for assistance through 07/15/2019. Called patient to advise.  Will send document to scan center.  Phone#[phone]  8:45 AM Beatriz Chancellor

## 2018-03-09 DIAGNOSIS — Z79899 Other long term (current) drug therapy: Secondary | ICD-10-CM | POA: Diagnosis not present

## 2018-03-09 DIAGNOSIS — F330 Major depressive disorder, recurrent, mild: Secondary | ICD-10-CM | POA: Diagnosis not present

## 2018-03-17 DIAGNOSIS — Z79899 Other long term (current) drug therapy: Secondary | ICD-10-CM | POA: Diagnosis not present

## 2018-03-17 DIAGNOSIS — F330 Major depressive disorder, recurrent, mild: Secondary | ICD-10-CM | POA: Diagnosis not present

## 2018-04-02 NOTE — Progress Notes (Signed)
Office Visit Note  Patient: Kirsten Harper             Date of Birth: 03/17/1946           MRN: [id number]             PCP: Venia Carbon, MD Referring: Venia Carbon, MD Visit Date: 04/03/2018 Occupation: _0 @  Subjective:  Left elbow swelling.   History of Present Illness: Kirsten Harper is a 72 y.o. female history of ankylosing spondylitis, uveitis and osteoarthritis.  She states she developed swelling over her left elbow for the last 4 days.  She states not painful.  There is no history of redness or fever.  Activities of Daily Living:  Patient reports morning stiffness for 20 minutes.   Patient Denies nocturnal pain.  Difficulty dressing/grooming: Denies Difficulty climbing stairs: Reports Difficulty getting out of chair: Reports Difficulty using hands for taps, buttons, cutlery, and/or writing: Denies  Review of Systems  Constitutional: Positive for fatigue. Negative for night sweats, weight gain and weight loss.  HENT: Positive for mouth dryness. Negative for mouth sores, trouble swallowing, trouble swallowing and nose dryness.   Eyes: Positive for dryness. Negative for pain, redness and visual disturbance.  Respiratory: Positive for shortness of breath. Negative for cough and difficulty breathing.        H/o COPD  Cardiovascular: Positive for hypertension. Negative for chest pain, palpitations, irregular heartbeat and swelling in legs/feet.  Gastrointestinal: Negative for blood in stool, constipation and diarrhea.  Endocrine: Negative for increased urination.  Genitourinary: Negative for vaginal dryness.  Musculoskeletal: Positive for joint swelling and morning stiffness. Negative for myalgias, muscle weakness, muscle tenderness and myalgias.  Skin: Negative for color change, rash, hair loss, skin tightness, ulcers and sensitivity to sunlight.  Allergic/Immunologic: Negative for susceptible to infections.  Neurological: Negative for dizziness, memory loss,  night sweats and weakness.  Hematological: Negative for swollen glands.  Psychiatric/Behavioral: Positive for sleep disturbance. Negative for depressed mood. The patient is nervous/anxious.     PMFS History:  Patient Active Problem List   Diagnosis Date Noted  . Abscess or cellulitis of toe, left 08/17/2017  . RLS (restless legs syndrome) 04/25/2017  . Mood disorder ([place]) 04/08/2017  . Sensory loss 04/08/2017  . COPD (chronic obstructive pulmonary disease) ([place]) 04/08/2017  . Ankylosing spondylitis of lumbosacral region ([place]) 09/24/2016  . GAD (generalized anxiety disorder) 08/27/2016  . 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 Spondyloarthropathy HLA-B27 positive 06/06/2016  . Iritis 06/06/2016  . High risk medication use 06/06/2016  . Primary osteoarthritis of both hands 06/06/2016  . Smoker 06/06/2016  . COPD exacerbation ([place]) 04/10/2016  . Anxiety 04/10/2016  . Essential hypertension     Past Medical History:  Diagnosis Date  . Ankylosing spondylitis ([place])   . Anxiety   . Arthritis   . Chronic lower back pain   . CKD (chronic kidney disease), stage II    "related to the Ankylosing spondylitis" (04/10/2016)  . COPD (chronic obstructive pulmonary disease) ([place])   . Eye disease   . Hypertension   . PONV (postoperative nausea and vomiting)    "didn't bother me the last time I had it in ~ 2015"    Family History  Problem Relation Age of Onset  . Hypertension Mother   . Heart disease Sister   . Diabetes Sister   . Pulmonary fibrosis Brother   . Alzheimer's disease Brother    Past Surgical History:  Procedure Laterality Date  .  BROW LIFT AND BLEPHAROPLASTY Bilateral ~ 2015  . CATARACT EXTRACTION W/ INTRAOCULAR LENS  IMPLANT, BILATERAL Bilateral 2000s  . MANDIBLE SURGERY  ~ 2000   "jaw had moved to left; broke my jaw in 5 places; put titanium in; stripped muscle from bone; wired jaw shut"  . TUBAL LIGATION  1979  . VAGINAL HYSTERECTOMY  1985    Social History   Social History Narrative   2 sons--not in area   Lives on property with her mother [name]      Has living will   Would want one of her sons, [name], to be Media planner   Requests DNR   No tube feeds    Objective: Vital Signs: BP 138/87 (BP Location: Right Arm, Patient Position: Sitting, Cuff Size: Normal)   Pulse 81   Resp 13   Ht _0  (1.727 m)   Wt 202 lb (91.6 kg)   BMI 30.71 kg/m    Physical Exam  Constitutional: She is oriented to person, place, and time. She appears well-developed and well-nourished.  HENT:  Head: Normocephalic and atraumatic.  Eyes: Conjunctivae and EOM are normal.  Neck: Normal range of motion.  Cardiovascular: Normal rate, regular rhythm, normal heart sounds and intact distal pulses.  Pulmonary/Chest: Effort normal and breath sounds normal.  Abdominal: Soft. Bowel sounds are normal.  Lymphadenopathy:    She has no cervical adenopathy.  Neurological: She is alert and oriented to person, place, and time.  Skin: Skin is warm and dry. Capillary refill takes less than 2 seconds.  Psychiatric: She has a normal mood and affect. Her behavior is normal.  Nursing note and vitals reviewed.    Musculoskeletal Exam: C-spine thoracic lumbar spine good range of motion.  Shoulder joints elbow joints wrist joint MCPs PIPs DIPs were in good range of motion with no synovitis.  She has left olecranon bursitis without any warmth or erythema.  She does have some DIP PIP thickening in her hands.  Hip joints, knee joints, ankles MTPs were in good range of motion with no synovitis.  She had mild tenderness over SI joint area.  CDAI Exam: CDAI Score: 2  Patient Global Assessment: 5 (mm); Provider Global Assessment: 5 (mm) Swollen: 1 ; Tender: 2  Joint Exam      Right  Left  Elbow     Swollen   Sacroiliac   Tender   Tender     Investigation: No additional findings.  Imaging: No results found.  Recent Labs: Lab Results  Component  Value Date   WBC 7.6 12/23/2017   HGB 13.6 12/23/2017   PLT 222 12/23/2017   NA 139 12/23/2017   K 4.2 12/23/2017   CL 102 12/23/2017   CO2 29 12/23/2017   GLUCOSE 91 12/23/2017   BUN 14 12/23/2017   CREATININE 0.79 12/23/2017   BILITOT 1.3 (H) 12/23/2017   ALKPHOS 74 03/03/2017   AST 15 12/23/2017   ALT 13 12/23/2017   PROT 7.0 12/23/2017   ALBUMIN 4.5 03/03/2017   CALCIUM 9.2 12/23/2017   GFRAA 88 12/23/2017   QFTBGOLDPLUS NEGATIVE 12/23/2017    Speciality Comments: No specialty comments available.  Procedures:  Medium Joint Inj: L olecranon bursa on 04/03/2018 12:27 PM Indications: pain Details: 27 G 1.5 in needle, posterior approach Medications: 1 mL lidocaine 1 %; 20 mg triamcinolone acetonide 40 MG/ML Aspirate: 6 mL blood-tinged; sent for lab analysis Outcome: tolerated well, no immediate complications Procedure, treatment alternatives, risks and benefits explained, specific risks discussed.  Consent was given by the patient. Immediately prior to procedure a time out was called to verify the correct patient, procedure, equipment, support staff and site/side marked as required. Patient was prepped and draped in the usual sterile fashion.     Allergies: Amoxapine and related   Assessment / Plan:     Visit Diagnoses: Ankylosing spondylitis of lumbosacral region (HCC)-patient clinically has been doing well on Humira.  She has left olecranon bursitis but no other joints being inflamed.  Olecranon bursitis of left elbow-different treatment options were discussed per her request left olecranon bursa was aspirated and injected as described above.  The fluid was sent for analysis.  HLA B 27 Positive   Uveitis-she has had no recurrence of uveitis.  High risk medication use - Humira 40 mg sq q wk.  We will check labs today and then every 3 months to monitor for drug toxicity.  Primary osteoarthritis of both knees-she has some discomfort which is tolerable  currently.  Primary osteoarthritis of both hands-she has some joint stiffness but no synovitis.  Other medical problems are listed as follows:  Essential hypertension  Former smoker  History of anxiety  History of COPD  History of hypertension   Orders: Orders Placed This Encounter  Procedures  . Medium Joint Inj  . Anaerobic and Aerobic Culture  . CBC with Differential/Platelet  . COMPLETE METABOLIC PANEL WITH GFR  . Synovial cell count + diff, w/ crystals   No orders of the defined types were placed in this encounter.   Face-to-face time spent with patient was 30 minutes. Greater than 50% of time was spent in counseling and coordination of care.  Follow-Up Instructions: Return in about 5 months (around 09/03/2018) for Ankylosing spondylitis.   Bo Merino, MD  Note - This record has been created using Editor, commissioning.  Chart creation errors have been sought, but may not always  have been located. Such creation errors do not reflect on  the standard of medical care.

## 2018-04-03 ENCOUNTER — Encounter: Payer: Self-pay | Admitting: Physician Assistant

## 2018-04-03 ENCOUNTER — Ambulatory Visit (INDEPENDENT_AMBULATORY_CARE_PROVIDER_SITE_OTHER): Payer: Medicare Other | Admitting: Rheumatology

## 2018-04-03 VITALS — BP 138/87 | HR 81 | Resp 13 | Ht 68.0 in | Wt 202.0 lb

## 2018-04-03 DIAGNOSIS — M1712 Unilateral primary osteoarthritis, left knee: Secondary | ICD-10-CM | POA: Diagnosis not present

## 2018-04-03 DIAGNOSIS — Z8659 Personal history of other mental and behavioral disorders: Secondary | ICD-10-CM

## 2018-04-03 DIAGNOSIS — Z8679 Personal history of other diseases of the circulatory system: Secondary | ICD-10-CM

## 2018-04-03 DIAGNOSIS — M4690 Unspecified inflammatory spondylopathy, site unspecified: Secondary | ICD-10-CM

## 2018-04-03 DIAGNOSIS — M19041 Primary osteoarthritis, right hand: Secondary | ICD-10-CM | POA: Diagnosis not present

## 2018-04-03 DIAGNOSIS — M19042 Primary osteoarthritis, left hand: Secondary | ICD-10-CM

## 2018-04-03 DIAGNOSIS — I10 Essential (primary) hypertension: Secondary | ICD-10-CM

## 2018-04-03 DIAGNOSIS — M457 Ankylosing spondylitis of lumbosacral region: Secondary | ICD-10-CM | POA: Diagnosis not present

## 2018-04-03 DIAGNOSIS — M1711 Unilateral primary osteoarthritis, right knee: Secondary | ICD-10-CM | POA: Diagnosis not present

## 2018-04-03 DIAGNOSIS — M7022 Olecranon bursitis, left elbow: Secondary | ICD-10-CM

## 2018-04-03 DIAGNOSIS — Z87891 Personal history of nicotine dependence: Secondary | ICD-10-CM

## 2018-04-03 DIAGNOSIS — M47819 Spondylosis without myelopathy or radiculopathy, site unspecified: Secondary | ICD-10-CM

## 2018-04-03 DIAGNOSIS — Z79899 Other long term (current) drug therapy: Secondary | ICD-10-CM

## 2018-04-03 DIAGNOSIS — Z8709 Personal history of other diseases of the respiratory system: Secondary | ICD-10-CM

## 2018-04-03 DIAGNOSIS — H209 Unspecified iridocyclitis: Secondary | ICD-10-CM

## 2018-04-03 MED ORDER — LIDOCAINE HCL 1 % IJ SOLN
1.0000 mL | INTRAMUSCULAR | Status: AC | PRN
  Administered 2018-04-03: 1 mL

## 2018-04-03 MED ORDER — TRIAMCINOLONE ACETONIDE 40 MG/ML IJ SUSP
20.0000 mg | INTRAMUSCULAR | Status: AC | PRN
  Administered 2018-04-03: 20 mg via INTRA_ARTICULAR

## 2018-04-03 NOTE — Patient Instructions (Signed)
Standing Labs We placed an order today for your standing lab work.    Please come back and get your standing labs in December and every 3 months   We have open lab Monday through Friday from 8:30-11:30 AM and 1:30-4:00 PM  at the office of Dr. Ruger Saxer.   You may experience shorter wait times on Monday and Friday afternoons. The office is located at [address], [address], [place], [place] [address] No appointment is necessary.   Labs are drawn by [facility].  You may receive a bill from [facility] for your lab work. If you have any questions regarding directions or hours of operation,  please call [phone].     

## 2018-04-06 NOTE — Progress Notes (Signed)
The fluid from the olecranon bursa is negative for crystals and culture

## 2018-04-09 LAB — CBC WITH DIFFERENTIAL/PLATELET
BASOS PCT: 0.7 %
Basophils Absolute: 48 cells/uL (ref 0–200)
Eosinophils Absolute: 269 cells/uL (ref 15–500)
Eosinophils Relative: 3.9 %
HEMATOCRIT: 42.8 % (ref 35.0–45.0)
Hemoglobin: 14.8 g/dL (ref 11.7–15.5)
LYMPHS ABS: 1904 {cells}/uL (ref 850–3900)
MCH: 32.8 pg (ref 27.0–33.0)
MCHC: 34.6 g/dL (ref 32.0–36.0)
MCV: 94.9 fL (ref 80.0–100.0)
MPV: 10.3 fL (ref 7.5–12.5)
Monocytes Relative: 10.6 %
Neutro Abs: 3947 cells/uL (ref 1500–7800)
Neutrophils Relative %: 57.2 %
PLATELETS: 210 10*3/uL (ref 140–400)
RBC: 4.51 10*6/uL (ref 3.80–5.10)
RDW: 12.3 % (ref 11.0–15.0)
Total Lymphocyte: 27.6 %
WBC: 6.9 10*3/uL (ref 3.8–10.8)
WBCMIX: 731 {cells}/uL (ref 200–950)

## 2018-04-09 LAB — COMPLETE METABOLIC PANEL WITH GFR
AG RATIO: 1.8 (calc) (ref 1.0–2.5)
ALKALINE PHOSPHATASE (APISO): 90 U/L (ref 33–130)
ALT: 15 U/L (ref 6–29)
AST: 15 U/L (ref 10–35)
Albumin: 4.6 g/dL (ref 3.6–5.1)
BILIRUBIN TOTAL: 0.9 mg/dL (ref 0.2–1.2)
BUN: 15 mg/dL (ref 7–25)
CHLORIDE: 101 mmol/L (ref 98–110)
CO2: 29 mmol/L (ref 20–32)
Calcium: 9.9 mg/dL (ref 8.6–10.4)
Creat: 0.81 mg/dL (ref 0.60–0.93)
GFR, Est African American: 85 mL/min/{1.73_m2} (ref >=60)
GFR, Est Non African American: 74 mL/min/{1.73_m2} (ref >=60)
Globulin: 2.5 g/dL (calc) (ref 1.9–3.7)
Glucose, Bld: 101 mg/dL — ABNORMAL HIGH (ref 65–99)
POTASSIUM: 4.5 mmol/L (ref 3.5–5.3)
Sodium: 138 mmol/L (ref 135–146)
Total Protein: 7.1 g/dL (ref 6.1–8.1)

## 2018-04-09 LAB — SYNOVIAL CELL COUNT + DIFF, W/ CRYSTALS
BASOPHILS, %: 1 % — AB
EOSINOPHILS-SYNOVIAL: 1 % (ref 0–2)
Lymphocytes-Synovial Fld: 25 % (ref 0–74)
MONOCYTE/MACROPHAGE: 33 % (ref 0–69)
Neutrophil, Synovial: 40 % — ABNORMAL HIGH (ref 0–24)
Synoviocytes, %: 0 % (ref 0–15)
WBC, Synovial: 361 cells/uL — ABNORMAL HIGH (ref <150)

## 2018-04-09 LAB — ANAEROBIC AND AEROBIC CULTURE
AER RESULT:: NO GROWTH
GRAM STAIN:: NONE SEEN
MICRO NUMBER: 91137267
MICRO NUMBER:: 91137268
SPECIMEN QUALITY: ADEQUATE
SPECIMEN QUALITY:: ADEQUATE

## 2018-04-16 DIAGNOSIS — Z23 Encounter for immunization: Secondary | ICD-10-CM | POA: Diagnosis not present

## 2018-04-27 ENCOUNTER — Encounter: Payer: Self-pay | Admitting: Internal Medicine

## 2018-05-04 ENCOUNTER — Ambulatory Visit (INDEPENDENT_AMBULATORY_CARE_PROVIDER_SITE_OTHER): Payer: Medicare Other | Admitting: Internal Medicine

## 2018-05-04 ENCOUNTER — Encounter: Payer: Self-pay | Admitting: Internal Medicine

## 2018-05-04 VITALS — BP 128/84 | HR 83 | Temp 98.7°F | Ht 66.5 in | Wt 200.0 lb

## 2018-05-04 DIAGNOSIS — M457 Ankylosing spondylitis of lumbosacral region: Secondary | ICD-10-CM | POA: Diagnosis not present

## 2018-05-04 DIAGNOSIS — Z7189 Other specified counseling: Secondary | ICD-10-CM

## 2018-05-04 DIAGNOSIS — J449 Chronic obstructive pulmonary disease, unspecified: Secondary | ICD-10-CM

## 2018-05-04 DIAGNOSIS — I10 Essential (primary) hypertension: Secondary | ICD-10-CM

## 2018-05-04 DIAGNOSIS — F39 Unspecified mood [affective] disorder: Secondary | ICD-10-CM

## 2018-05-04 DIAGNOSIS — Z0000 Encounter for general adult medical examination without abnormal findings: Secondary | ICD-10-CM | POA: Diagnosis not present

## 2018-05-04 DIAGNOSIS — F1320 Sedative, hypnotic or anxiolytic dependence, uncomplicated: Secondary | ICD-10-CM

## 2018-05-04 MED ORDER — ALBUTEROL SULFATE HFA 108 (90 BASE) MCG/ACT IN AERS: INHALATION_SPRAY | RESPIRATORY_TRACT | 1 refills | Status: DC

## 2018-05-04 NOTE — Assessment & Plan Note (Signed)
BP Readings from Last 3 Encounters:  05/04/18 128/84  04/03/18 138/87  12/23/17 140/86   Good control Recent blood work is fine

## 2018-05-04 NOTE — Assessment & Plan Note (Signed)
Chronic anxiety Depression is better On clonazepam daily

## 2018-05-04 NOTE — Progress Notes (Signed)
Subjective:    Patient ID: Kirsten Harper, female    DOB: 1945-11-16, 72 y.o.   MRN: [id number]  HPI Here for Medicare wellness visit and follow up of chronic health conditions Reviewed form and advanced directives Reviewed other doctors Vision is poor Hearing is okay Will have 1 glass of wine some days Still smokes cigarettes ----5-6 cigarettes per day. Has stopped, but then will restart  Asked her to quit  No falls Chronic mood issues Still lives on property of her aging mom (and step dad) Independent with instrumental ADLs--still drives locally No memory problems  Chronic uveitis Vision is worsening Sees Kirsten Harper about this On humira for this (and for the ankylosing spondylitis)  Has had injections in knees and treatment for her elbow Got gel in her knees this time---hasn't seen the improvement yet (like she would get with cortisone shots) Limited in walking due to dull pain in back Now on sulindac --takes only once in a while  Ongoing RLS Ropinirole really helps---she feels it is her most useful medication Has tried to hold it---and symptoms are bad  Mood has been better lately Finds the amitriptyline really helps Anxiety is not as bad--but she takes clonazepam every morning  Breathing is okay If she gets a cold, it really settles in her chest Some cough---but it may be related to post nasal drip Uses the albuterol prn---but often three times a day Doesn't feel limited by her breathing  No chest pain No palpitations No dizziness or syncope No headaches  Current Outpatient Medications on File Prior to Visit  Medication Sig Dispense Refill  . Adalimumab (HUMIRA PEN) 40 MG/0.4ML PNKT Inject 40 mg into the skin every 14 (fourteen) days. 3 each 0  . amitriptyline (ELAVIL) 25 MG tablet     . clonazePAM (KLONOPIN) 0.5 MG tablet Take 0.5 mg by mouth 2 (two) times daily.    . dorzolamide-timolol (COSOPT) 22.3-6.8 MG/ML ophthalmic solution Place 1 drop into both eyes  daily.     Marland Kitchen losartan (COZAAR) 100 MG tablet Take 100 mg by mouth daily.     Marland Kitchen LOTEMAX 0.5 % ophthalmic suspension Place 1 drop into both eyes daily.     Marland Kitchen rOPINIRole (REQUIP) 1 MG tablet Take 3 tablets (3 mg total) by mouth at bedtime. 90 tablet 11  . sulindac (CLINORIL) 150 MG tablet Take 1 tablet (150 mg total) by mouth 2 (two) times daily as needed. 60 tablet 2  . VENTOLIN HFA 108 (90 Base) MCG/ACT inhaler INHALE 2 PUFFS INTO THE LUNGS EVERY 4 HOURS AS NEEDED FOR WHEEZING/ FOR SHORTNESS OF BREATH 18 Inhaler 0   No current facility-administered medications on file prior to visit.     Allergies  Allergen Reactions  . Amoxapine And Related     Patient reports it did not work for her.     Past Medical History:  Diagnosis Date  . Ankylosing spondylitis ([place])   . Anxiety   . Arthritis   . Chronic lower back pain   . CKD (chronic kidney disease), stage II    "related to the Ankylosing spondylitis" (04/10/2016)  . COPD (chronic obstructive pulmonary disease) ([place])   . Eye disease   . Hypertension   . PONV (postoperative nausea and vomiting)    "didn't bother me the last time I had it in ~ 2015"    Past Surgical History:  Procedure Laterality Date  . BROW LIFT AND BLEPHAROPLASTY Bilateral ~ 2015  . CATARACT EXTRACTION W/ INTRAOCULAR  LENS  IMPLANT, BILATERAL Bilateral 2000s  . MANDIBLE SURGERY  ~ 2000   "jaw had moved to left; broke my jaw in 5 places; put titanium in; stripped muscle from bone; wired jaw shut"  . TUBAL LIGATION  1979  . VAGINAL HYSTERECTOMY  1985    Family History  Problem Relation Age of Onset  . Hypertension Mother   . Heart disease Sister   . Diabetes Sister   . Pulmonary fibrosis Brother   . Idiopathic pulmonary fibrosis Brother   . Alzheimer's disease Brother   . Dementia Brother     Social History   Socioeconomic History  . Marital status: Divorced    Spouse name: Not on file  . Number of children: 2  . Years of education: Not on file  .  Highest education level: Not on file  Occupational History  . Occupation: Sales promotion account executive for [employer]    Comment: Retired  Scientific laboratory technician  . Financial resource strain: Not on file  . Food insecurity:    Worry: Not on file    Inability: Not on file  . Transportation needs:    Medical: Not on file    Non-medical: Not on file  Tobacco Use  . Smoking status: Former Smoker    Packs/day: 0.50    Years: 51.00    Pack years: 25.50    Types: Cigarettes  . Smokeless tobacco: Never Used  Substance and Sexual Activity  . Alcohol use: Yes    Alcohol/week: 0.0 standard drinks  . Drug use: No  . Sexual activity: Never  Lifestyle  . Physical activity:    Days per week: Not on file    Minutes per session: Not on file  . Stress: Not on file  Relationships  . Social connections:    Talks on phone: Not on file    Gets together: Not on file    Attends religious service: Not on file    Active member of club or organization: Not on file    Attends meetings of clubs or organizations: Not on file    Relationship status: Not on file  . Intimate partner violence:    Fear of current or ex partner: Not on file    Emotionally abused: Not on file    Physically abused: Not on file    Forced sexual activity: Not on file  Other Topics Concern  . Not on file  Social History Narrative   2 sons--not in area   Lives on property with her mother Kirsten Harper      Has living will   Would want one of her sons, Kirsten Harper, to be Media planner   Requests DNR   No tube feeds   Review of Systems  Appetite is too good Has gained weight in the past year Sleeps fair---but awakens in middle of night and trouble getting back to sleep Wears seat belt Teeth are okay--keeps up with dentist Needs to set up with dermatologist---due to humira. No worrisome lesions No heartburn or dysphagia Bowels are fine--no blood Voids okay     Objective:   Physical Exam  Constitutional: She is oriented to person, place,  and time. She appears well-developed. No distress.  HENT:  Mouth/Throat: Oropharynx is clear and moist. No oropharyngeal exudate.  Neck: No thyromegaly present.  Cardiovascular: Normal rate, regular rhythm, normal heart sounds and intact distal pulses. Exam reveals no gallop.  No murmur heard. Respiratory: Effort normal and breath sounds normal. No respiratory distress. She has  no wheezes. She has no rales.  GI: Soft. There is no tenderness.  Musculoskeletal: She exhibits no edema or tenderness.  Lymphadenopathy:    She has no cervical adenopathy.  Neurological: She is alert and oriented to person, place, and time.  President--- "Trump, Obama, Clinton---Bush" [phone] D-l-r-[id number]-w Recall 2/3  Skin: No rash noted. No erythema.  Psychiatric: She has a normal mood and affect. Her behavior is normal.           Assessment & Plan:

## 2018-05-04 NOTE — Assessment & Plan Note (Signed)
On the clonazepam daily No clear tolerance though

## 2018-05-04 NOTE — Assessment & Plan Note (Signed)
Pain and limits her movement humira is likely treating this also

## 2018-05-04 NOTE — Patient Instructions (Signed)
DASH Eating Plan DASH stands for "Dietary Approaches to Stop Hypertension." The DASH eating plan is a healthy eating plan that has been shown to reduce high blood pressure (hypertension). It may also reduce your risk for type 2 diabetes, heart disease, and stroke. The DASH eating plan may also help with weight loss. What are tips for following this plan? General guidelines  Avoid eating more than 2,300 mg (milligrams) of salt (sodium) a day. If you have hypertension, you may need to reduce your sodium intake to 1,500 mg a day.  Limit alcohol intake to no more than 1 drink a day for nonpregnant women and 2 drinks a day for men. One drink equals 12 oz of beer, 5 oz of wine, or 1 oz of hard liquor.  Work with your health care provider to maintain a healthy body weight or to lose weight. Ask what an ideal weight is for you.  Get at least 30 minutes of exercise that causes your heart to beat faster (aerobic exercise) most days of the week. Activities may include walking, swimming, or biking.  Work with your health care provider or diet and nutrition specialist (dietitian) to adjust your eating plan to your individual calorie needs. Reading food labels  Check food labels for the amount of sodium per serving. Choose foods with less than 5 percent of the Daily Value of sodium. Generally, foods with less than 300 mg of sodium per serving fit into this eating plan.  To find whole grains, look for the word "whole" as the first word in the ingredient list. Shopping  Buy products labeled as "low-sodium" or "no salt added."  Buy fresh foods. Avoid canned foods and premade or frozen meals. Cooking  Avoid adding salt when cooking. Use salt-free seasonings or herbs instead of table salt or sea salt. Check with your health care provider or pharmacist before using salt substitutes.  Do not fry foods. Cook foods using healthy methods such as baking, boiling, grilling, and broiling instead.  Cook with  heart-healthy oils, such as olive, canola, soybean, or sunflower oil. Meal planning   Eat a balanced diet that includes: ? 5 or more servings of fruits and vegetables each day. At each meal, try to fill half of your plate with fruits and vegetables. ? Up to 6-8 servings of whole grains each day. ? Less than 6 oz of lean meat, poultry, or fish each day. A 3-oz serving of meat is about the same size as a deck of cards. One egg equals 1 oz. ? 2 servings of low-fat dairy each day. ? A serving of nuts, seeds, or beans 5 times each week. ? Heart-healthy fats. Healthy fats called Omega-3 fatty acids are found in foods such as flaxseeds and coldwater fish, like sardines, salmon, and mackerel.  Limit how much you eat of the following: ? Canned or prepackaged foods. ? Food that is high in trans fat, such as fried foods. ? Food that is high in saturated fat, such as fatty meat. ? Sweets, desserts, sugary drinks, and other foods with added sugar. ? Full-fat dairy products.  Do not salt foods before eating.  Try to eat at least 2 vegetarian meals each week.  Eat more home-cooked food and less restaurant, buffet, and fast food.  When eating at a restaurant, ask that your food be prepared with less salt or no salt, if possible. What foods are recommended? The items listed may not be a complete list. Talk with your dietitian about what   dietary choices are best for you. Grains Whole-grain or whole-wheat bread. Whole-grain or whole-wheat pasta. Brown rice. Oatmeal. Quinoa. Bulgur. Whole-grain and low-sodium cereals. Pita bread. Low-fat, low-sodium crackers. Whole-wheat flour tortillas. Vegetables Fresh or frozen vegetables (raw, steamed, roasted, or grilled). Low-sodium or reduced-sodium tomato and vegetable juice. Low-sodium or reduced-sodium tomato sauce and tomato paste. Low-sodium or reduced-sodium canned vegetables. Fruits All fresh, dried, or frozen fruit. Canned fruit in natural juice (without  added sugar). Meat and other protein foods Skinless chicken or turkey. Ground chicken or turkey. Pork with fat trimmed off. Fish and seafood. Egg whites. Dried beans, peas, or lentils. Unsalted nuts, nut butters, and seeds. Unsalted canned beans. Lean cuts of beef with fat trimmed off. Low-sodium, lean deli meat. Dairy Low-fat (1%) or fat-free (skim) milk. Fat-free, low-fat, or reduced-fat cheeses. Nonfat, low-sodium ricotta or cottage cheese. Low-fat or nonfat yogurt. Low-fat, low-sodium cheese. Fats and oils Soft margarine without trans fats. Vegetable oil. Low-fat, reduced-fat, or light mayonnaise and salad dressings (reduced-sodium). Canola, safflower, olive, soybean, and sunflower oils. Avocado. Seasoning and other foods Herbs. Spices. Seasoning mixes without salt. Unsalted popcorn and pretzels. Fat-free sweets. What foods are not recommended? The items listed may not be a complete list. Talk with your dietitian about what dietary choices are best for you. Grains Baked goods made with fat, such as croissants, muffins, or some breads. Dry pasta or rice meal packs. Vegetables Creamed or fried vegetables. Vegetables in a cheese sauce. Regular canned vegetables (not low-sodium or reduced-sodium). Regular canned tomato sauce and paste (not low-sodium or reduced-sodium). Regular tomato and vegetable juice (not low-sodium or reduced-sodium). Pickles. Olives. Fruits Canned fruit in a light or heavy syrup. Fried fruit. Fruit in cream or butter sauce. Meat and other protein foods Fatty cuts of meat. Ribs. Fried meat. Bacon. Sausage. Bologna and other processed lunch meats. Salami. Fatback. Hotdogs. Bratwurst. Salted nuts and seeds. Canned beans with added salt. Canned or smoked fish. Whole eggs or egg yolks. Chicken or turkey with skin. Dairy Whole or 2% milk, cream, and half-and-half. Whole or full-fat cream cheese. Whole-fat or sweetened yogurt. Full-fat cheese. Nondairy creamers. Whipped toppings.  Processed cheese and cheese spreads. Fats and oils Butter. Stick margarine. Lard. Shortening. Ghee. Bacon fat. Tropical oils, such as coconut, palm kernel, or palm oil. Seasoning and other foods Salted popcorn and pretzels. Onion salt, garlic salt, seasoned salt, table salt, and sea salt. Worcestershire sauce. Tartar sauce. Barbecue sauce. Teriyaki sauce. Soy sauce, including reduced-sodium. Steak sauce. Canned and packaged gravies. Fish sauce. Oyster sauce. Cocktail sauce. Horseradish that you find on the shelf. Ketchup. Mustard. Meat flavorings and tenderizers. Bouillon cubes. Hot sauce and Tabasco sauce. Premade or packaged marinades. Premade or packaged taco seasonings. Relishes. Regular salad dressings. Where to find more information:  National Heart, Lung, and Blood Institute: www.nhlbi.nih.gov  American Heart Association: www.heart.org Summary  The DASH eating plan is a healthy eating plan that has been shown to reduce high blood pressure (hypertension). It may also reduce your risk for type 2 diabetes, heart disease, and stroke.  With the DASH eating plan, you should limit salt (sodium) intake to 2,300 mg a day. If you have hypertension, you may need to reduce your sodium intake to 1,500 mg a day.  When on the DASH eating plan, aim to eat more fresh fruits and vegetables, whole grains, lean proteins, low-fat dairy, and heart-healthy fats.  Work with your health care provider or diet and nutrition specialist (dietitian) to adjust your eating plan to your individual   calorie needs. This information is not intended to replace advice given to you by your health care provider. Make sure you discuss any questions you have with your health care provider. Document Released: 06/20/2011 Document Revised: 06/24/2016 Document Reviewed: 06/24/2016 Elsevier Interactive Patient Education  2018 Elsevier Inc.  

## 2018-05-04 NOTE — Assessment & Plan Note (Signed)
See social history 

## 2018-05-04 NOTE — Progress Notes (Signed)
Hearing Screening   Method: Audiometry   125Hz 250Hz 500Hz 1000Hz 2000Hz 3000Hz 4000Hz 6000Hz 8000Hz  Right ear:   20 20 20  20    Left ear:   20 20 20  20    Vision Screening Comments: October 2019   

## 2018-05-04 NOTE — Assessment & Plan Note (Signed)
I have personally reviewed the Medicare Annual Wellness questionnaire and have noted 1. The patient's medical and social history 2. Their use of alcohol, tobacco or illicit drugs 3. Their current medications and supplements 4. The patient's functional ability including ADL's, fall risks, home safety risks and hearing or visual             impairment. 5. Diet and physical activities 6. Evidence for depression or mood disorders  The patients weight, height, BMI and visual acuity have been recorded in the chart I have made referrals, counseling and provided education to the patient based review of the above and I have provided the pt with a written personalized care plan for preventive services.  I have provided you with a copy of your personalized plan for preventive services. Please take the time to review along with your updated medication list.  Had flu vaccine Colonoscopy seems to be due next year--can't find report She prefers no mammogram Discussed stopping smoking again Fitness and weight loss

## 2018-05-04 NOTE — Assessment & Plan Note (Signed)
Mild  Albuterol only Discussed cigarette cessation

## 2018-05-05 DIAGNOSIS — M450 Ankylosing spondylitis of multiple sites in spine: Secondary | ICD-10-CM | POA: Diagnosis not present

## 2018-05-05 DIAGNOSIS — H0233 Blepharochalasis right eye, unspecified eyelid: Secondary | ICD-10-CM | POA: Diagnosis not present

## 2018-05-05 DIAGNOSIS — H35371 Puckering of macula, right eye: Secondary | ICD-10-CM | POA: Diagnosis not present

## 2018-05-05 DIAGNOSIS — H35033 Hypertensive retinopathy, bilateral: Secondary | ICD-10-CM | POA: Diagnosis not present

## 2018-05-05 DIAGNOSIS — H209 Unspecified iridocyclitis: Secondary | ICD-10-CM | POA: Diagnosis not present

## 2018-05-05 DIAGNOSIS — Z961 Presence of intraocular lens: Secondary | ICD-10-CM | POA: Diagnosis not present

## 2018-05-10 ENCOUNTER — Other Ambulatory Visit: Payer: Self-pay | Admitting: Internal Medicine

## 2018-05-12 DIAGNOSIS — H20021 Recurrent acute iridocyclitis, right eye: Secondary | ICD-10-CM | POA: Diagnosis not present

## 2018-05-12 DIAGNOSIS — H04123 Dry eye syndrome of bilateral lacrimal glands: Secondary | ICD-10-CM | POA: Diagnosis not present

## 2018-05-12 DIAGNOSIS — Z961 Presence of intraocular lens: Secondary | ICD-10-CM | POA: Diagnosis not present

## 2018-05-13 NOTE — Progress Notes (Deleted)
Office Visit Note  Patient: Kirsten Harper             Date of Birth: 28-Jul-1945           MRN: [id number]             PCP: Venia Carbon, MD Referring: Venia Carbon, MD Visit Date: 05/27/2018 Occupation: _0 @  Subjective:  No chief complaint on file.   History of Present Illness: Kirsten Harper is a 72 y.o. female ***   Activities of Daily Living:  Patient reports morning stiffness for *** {minute/hour:19697}.   Patient {ACTIONS;DENIES/REPORTS:21021675::"Denies"} nocturnal pain.  Difficulty dressing/grooming: {ACTIONS;DENIES/REPORTS:21021675::"Denies"} Difficulty climbing stairs: {ACTIONS;DENIES/REPORTS:21021675::"Denies"} Difficulty getting out of chair: {ACTIONS;DENIES/REPORTS:21021675::"Denies"} Difficulty using hands for taps, buttons, cutlery, and/or writing: {ACTIONS;DENIES/REPORTS:21021675::"Denies"}  No Rheumatology ROS completed.   PMFS History:  Patient Active Problem List   Diagnosis Date Noted  . Preventative health care 05/04/2018  . Advance directive discussed with patient 05/04/2018  . RLS (restless legs syndrome) 04/25/2017  . Mood disorder ([place]) 04/08/2017  . Sensory loss 04/08/2017  . COPD (chronic obstructive pulmonary disease) ([place]) 04/08/2017  . Ankylosing spondylitis of lumbosacral region ([place]) 09/24/2016  . GAD (generalized anxiety disorder) 08/27/2016  . 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 Spondyloarthropathy HLA-B27 positive 06/06/2016  . Iritis 06/06/2016  . High risk medication use 06/06/2016  . Primary osteoarthritis of both hands 06/06/2016  . Smoker 06/06/2016  . COPD exacerbation ([place]) 04/10/2016  . Anxiety 04/10/2016  . Essential hypertension     Past Medical History:  Diagnosis Date  . Ankylosing spondylitis ([place])   . Anxiety   . Arthritis   . Chronic lower back pain   . CKD (chronic kidney disease), stage II    "related to the Ankylosing spondylitis" (04/10/2016)  . COPD (chronic  obstructive pulmonary disease) ([place])   . Eye disease   . Hypertension   . PONV (postoperative nausea and vomiting)    "didn't bother me the last time I had it in ~ 2015"    Family History  Problem Relation Age of Onset  . Hypertension Mother   . Heart disease Sister   . Diabetes Sister   . Pulmonary fibrosis Brother   . Idiopathic pulmonary fibrosis Brother   . Alzheimer's disease Brother   . Dementia Brother    Past Surgical History:  Procedure Laterality Date  . BROW LIFT AND BLEPHAROPLASTY Bilateral ~ 2015  . CATARACT EXTRACTION W/ INTRAOCULAR LENS  IMPLANT, BILATERAL Bilateral 2000s  . MANDIBLE SURGERY  ~ 2000   "jaw had moved to left; broke my jaw in 5 places; put titanium in; stripped muscle from bone; wired jaw shut"  . TUBAL LIGATION  1979  . VAGINAL HYSTERECTOMY  1985   Social History   Social History Narrative   2 sons--not in area   Lives on property with her mother Kirsten Harper      Has living will   Would want one of her sons, Kirsten Harper, to be Media planner   Requests DNR   No tube feeds    Objective: Vital Signs: There were no vitals taken for this visit.   Physical Exam   Musculoskeletal Exam: ***  CDAI Exam: CDAI Score: Not documented Patient Global Assessment: Not documented; Provider Global Assessment: Not documented Swollen: Not documented; Tender: Not documented Joint Exam   Not documented   There is currently no information documented on the homunculus. Go to the Rheumatology activity and complete the homunculus joint exam.  Investigation: No additional findings.  Imaging: No results found.  Recent Labs: Lab Results  Component Value Date   WBC 6.9 04/03/2018   HGB 14.8 04/03/2018   PLT 210 04/03/2018   NA 138 04/03/2018   K 4.5 04/03/2018   CL 101 04/03/2018   CO2 29 04/03/2018   GLUCOSE 101 (H) 04/03/2018   BUN 15 04/03/2018   CREATININE 0.81 04/03/2018   BILITOT 0.9 04/03/2018   ALKPHOS 74 03/03/2017   AST 15 04/03/2018    ALT 15 04/03/2018   PROT 7.1 04/03/2018   ALBUMIN 4.5 03/03/2017   CALCIUM 9.9 04/03/2018   GFRAA 85 04/03/2018   QFTBGOLDPLUS NEGATIVE 12/23/2017    Speciality Comments: No specialty comments available.  Procedures:  No procedures performed Allergies: Amoxapine and related   Assessment / Plan:     Visit Diagnoses: Ankylosing spondylitis of lumbosacral region (HCC)  HLA B 27 Positive   Uveitis  High risk medication use -  Humira 40 mg sq q wk.    Olecranon bursitis of left elbow  Primary osteoarthritis of left knee  Primary osteoarthritis of right knee  Primary osteoarthritis of both hands  Essential hypertension  Former smoker  History of anxiety  History of COPD   Orders: No orders of the defined types were placed in this encounter.  No orders of the defined types were placed in this encounter.   Face-to-face time spent with patient was *** minutes. Greater than 50% of time was spent in counseling and coordination of care.  Follow-Up Instructions: No follow-ups on file.   Ofilia Neas, PA-C  Note - This record has been created using Dragon software.  Chart creation errors have been sought, but may not always  have been located. Such creation errors do not reflect on  the standard of medical care.

## 2018-05-19 DIAGNOSIS — F410 Panic disorder [episodic paroxysmal anxiety] without agoraphobia: Secondary | ICD-10-CM | POA: Diagnosis not present

## 2018-05-19 DIAGNOSIS — Z79899 Other long term (current) drug therapy: Secondary | ICD-10-CM | POA: Diagnosis not present

## 2018-05-26 NOTE — Progress Notes (Signed)
Office Visit Note  Patient: Kirsten Harper             Date of Birth: July 26, 1945           MRN: [id number]             PCP: Venia Carbon, MD Referring: Venia Carbon, MD Visit Date: 06/09/2018 Occupation: _0 @  Subjective:  Bilateral knee joint pain    History of Present Illness: Kirsten Harper is a 71 y.o. female  history of ankylosing spondylitis, uveitis and osteoarthritis.  She has been off of Humira for 1 month but she is planning on restarting. She denies any flares of uveitis.  She states that 2 weeks ago she fell on the concrete and she continues to pain in multiple joints. She reports she has been falling more frequently. She is having pain in bilateral knee joints, lower back, both hands, left shoulder, and right great toe.  She is having difficulty getting up from a chair.  She states today she got compression sleeves for both knees which have been providing some pain relief.  She has not tried ice. She states her lower back pain is improving.  She states if she stands for longer than 30 minutes she develops a dull ache in her lower back. She denies any hip or groin pain.    Activities of Daily Living:  Patient reports morning stiffness  all day.   Patient Denies nocturnal pain.  Difficulty dressing/grooming: Denies Difficulty climbing stairs: Reports Difficulty getting out of chair: Reports Difficulty using hands for taps, buttons, cutlery, and/or writing: Denies  Review of Systems  Constitutional: Positive for fatigue.  HENT: Positive for mouth dryness. Negative for mouth sores and nose dryness.   Eyes: Negative for pain, visual disturbance and dryness.  Respiratory: Negative for cough, hemoptysis, shortness of breath and difficulty breathing.   Cardiovascular: Negative for chest pain, palpitations, hypertension and swelling in legs/feet.  Gastrointestinal: Negative for blood in stool, constipation and diarrhea.  Endocrine: Negative for increased urination.    Genitourinary: Negative for painful urination.  Musculoskeletal: Positive for arthralgias, joint pain, joint swelling, morning stiffness and muscle tenderness. Negative for myalgias, muscle weakness and myalgias.  Skin: Negative for color change, pallor, rash, hair loss, nodules/bumps, skin tightness, ulcers and sensitivity to sunlight.  Allergic/Immunologic: Negative for susceptible to infections.  Neurological: Negative for dizziness, numbness, headaches and weakness.  Hematological: Negative for swollen glands.  Psychiatric/Behavioral: Negative for depressed mood and sleep disturbance. The patient is nervous/anxious.     PMFS History:  Patient Active Problem List   Diagnosis Date Noted  . Preventative health care 05/04/2018  . Advance directive discussed with patient 05/04/2018  . RLS (restless legs syndrome) 04/25/2017  . Mood disorder ([place]) 04/08/2017  . Sensory loss 04/08/2017  . COPD (chronic obstructive pulmonary disease) ([place]) 04/08/2017  . Ankylosing spondylitis of lumbosacral region ([place]) 09/24/2016  . GAD (generalized anxiety disorder) 08/27/2016  . 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 Spondyloarthropathy HLA-B27 positive 06/06/2016  . Iritis 06/06/2016  . High risk medication use 06/06/2016  . Primary osteoarthritis of both hands 06/06/2016  . Smoker 06/06/2016  . COPD exacerbation ([place]) 04/10/2016  . Anxiety 04/10/2016  . Essential hypertension     Past Medical History:  Diagnosis Date  . Ankylosing spondylitis ([place])   . Anxiety   . Arthritis   . Chronic lower back pain   . CKD (chronic kidney disease), stage II    "related to  the Ankylosing spondylitis" (04/10/2016)  . COPD (chronic obstructive pulmonary disease) ([place])   . Eye disease   . Hypertension   . PONV (postoperative nausea and vomiting)    "didn't bother me the last time I had it in ~ 2015"    Family History  Problem Relation Age of Onset  . Hypertension Mother   .  Heart disease Sister   . Diabetes Sister   . Pulmonary fibrosis Brother   . Idiopathic pulmonary fibrosis Brother   . Alzheimer's disease Brother   . Dementia Brother    Past Surgical History:  Procedure Laterality Date  . BROW LIFT AND BLEPHAROPLASTY Bilateral ~ 2015  . CATARACT EXTRACTION W/ INTRAOCULAR LENS  IMPLANT, BILATERAL Bilateral 2000s  . MANDIBLE SURGERY  ~ 2000   "jaw had moved to left; broke my jaw in 5 places; put titanium in; stripped muscle from bone; wired jaw shut"  . TUBAL LIGATION  1979  . VAGINAL HYSTERECTOMY  1985   Social History   Social History Narrative   2 sons--not in area   Lives on property with her mother [name]      Has living will   Would want one of her sons, [name], to be Media planner   Requests DNR   No tube feeds   Immunization History  Administered Date(s) Administered  . Influenza Inj Mdck Quad Pf 04/16/2018  . Pneumococcal Conjugate-13 05/10/2013  . Pneumococcal Polysaccharide-23 09/20/2014  . Tdap 04/30/2011  . Zoster Recombinat (Shingrix) 10/01/2016    Objective: Vital Signs: BP (!) 157/91 (BP Location: Left Arm, Patient Position: Sitting, Cuff Size: Normal)   Pulse (!) 102   Resp 14   Ht _0  (1.702 m)   Wt 204 lb (92.5 kg)   BMI 31.95 kg/m    Physical Exam  Constitutional: She is oriented to person, place, and time. She appears well-developed and well-nourished.  HENT:  Head: Normocephalic and atraumatic.  Eyes: Conjunctivae and EOM are normal.  Neck: Normal range of motion.  Cardiovascular: Normal rate, regular rhythm, normal heart sounds and intact distal pulses.  Pulmonary/Chest: Effort normal and breath sounds normal.  Abdominal: Soft. Bowel sounds are normal.  Lymphadenopathy:    She has no cervical adenopathy.  Neurological: She is alert and oriented to person, place, and time.  Skin: Skin is warm and dry. Capillary refill takes less than 2 seconds.  Psychiatric: She has a normal mood and affect. Her  behavior is normal.  Nursing note and vitals reviewed.    Musculoskeletal Exam: C-spine slightly limited ROM with lateral rotation.  Thoracic and lumbar spine limited with discomfort.  No midline spinal tenderness.  No SI joint tenderness.  Shoulder joints, elbow joints, wrist joints, MCPs, PIPs, and DIPs good ROM with no synovitis.  Complete fist formation bilaterally.  Hip joints, knee joints, ankle joints, MTPs, PIPs, and DIPs good ROM with no synovitis.  No warmth or effusion of knee joints.  No ecchymosis noted.  Small healing wound on right knee from fall.  No tenderness or swelling of ankle joints.    CDAI Exam: CDAI Score: Not documented Patient Global Assessment: Not documented; Provider Global Assessment: Not documented Swollen: Not documented; Tender: Not documented Joint Exam   Not documented   There is currently no information documented on the homunculus. Go to the Rheumatology activity and complete the homunculus joint exam.  Investigation: No additional findings.  Imaging: No results found.  Recent Labs: Lab Results  Component Value Date  WBC 6.9 04/03/2018   HGB 14.8 04/03/2018   PLT 210 04/03/2018   NA 138 04/03/2018   K 4.5 04/03/2018   CL 101 04/03/2018   CO2 29 04/03/2018   GLUCOSE 101 (H) 04/03/2018   BUN 15 04/03/2018   CREATININE 0.81 04/03/2018   BILITOT 0.9 04/03/2018   ALKPHOS 74 03/03/2017   AST 15 04/03/2018   ALT 15 04/03/2018   PROT 7.1 04/03/2018   ALBUMIN 4.5 03/03/2017   CALCIUM 9.9 04/03/2018   GFRAA 85 04/03/2018   QFTBGOLDPLUS NEGATIVE 12/23/2017    Speciality Comments: No specialty comments available.  Procedures:  No procedures performed Allergies: Amoxapine and related    Assessment / Plan:     Visit Diagnoses: Ankylosing spondylitis of lumbosacral region [facility]) - She is clinically doing well on Humira.  She has no midline spinal tenderness or SI joint tenderness.  she was advised to restart on Humira 40 mg sq injections  every 2 weeks. Plan: Ambulatory referral to Physical Therapy  Uveitis: She has not had any recent recurrences of uveitis. She continues to use eyedrops on a daily basis.  She was advised to restart on Humira.    HLA B 27 Positive   High risk medication use - Humira 40 mg sq q wk. Standing orders for CBC and CMP were placed today. She had the yearly influenza vaccination.  Last TB gold negative on 12/23/17.  Most recent CBC/CMP within normal limits on 04/03/18.  Next CBC/CMP due in December and then every 3 months.    - Plan: CBC with Differential/Platelet, COMPLETE METABOLIC PANEL WITH GFR  Olecranon bursitis of left elbow - left olecranon bursa was aspirated and injected on 04/03/2018.The fluid from the olecranon bursa is negative for crystals and culture. She has no tenderness or swelling at this time.   Primary osteoarthritis of both hands: She has PIP and DIP synovial thickening.  She has complete fist formation bilaterally.  No synovitis.  Joint protection and muscle strengthening were discussed.   Primary osteoarthritis of both knees: She fell 2 weeks ago on bilateral knee joints.  She is having increased discomfort bilaterally. No warmth or effusion noted.  She was given a prescription for voltaren gel which she can apply 3 grams to 3 large joints up to 3 times daily.  She requested bilateral cortisone injections today.  Her blood pressure was elevated today in the office, so we opted out of performing the injctions.  A referral to PT was placed today for lower extremity muscle strengthening and bilateral knee pain.  We discussed the importance of fall prevention. We also discussed the importance of exercise and weight loss.  She plans on starting to ice, elevate, and use compression sleeves on both knee joints.  Pain in femur - She fell 2 weeks ago.  She is having difficulty getting up from a chair and walking. She feels like her left lower extremity is weak. A x-ray of the left femur was obtained  today.  The x-ray was unremarkable. Plan: XR FEMUR MIN 2 VIEWS LEFT   Other medical conditions are listed as follows:   History of hypertension  History of anxiety  History of COPD  Former smoker    Orders: Orders Placed This Encounter  Procedures  . XR FEMUR MIN 2 VIEWS LEFT  . CBC with Differential/Platelet  . COMPLETE METABOLIC PANEL WITH GFR  . Ambulatory referral to Physical Therapy   Meds ordered this encounter  Medications  . diclofenac sodium (VOLTAREN) 1 %  GEL    Sig: Apply 3 grams to 3 large joints up to 3 times daily    Dispense:  3 Tube    Refill:  3    Face-to-face time spent with patient was 30 minutes. Greater than 50% of time was spent in counseling and coordination of care.  Follow-Up Instructions: Return in about 5 months (around 11/08/2018) for Ankylosing Spondylitis, Osteoarthritis.   Ofilia Neas, PA-C  Note - This record has been created using Dragon software.  Chart creation errors have been sought, but may not always  have been located. Such creation errors do not reflect on  the standard of medical care.

## 2018-05-27 ENCOUNTER — Ambulatory Visit: Payer: Self-pay | Admitting: Physician Assistant

## 2018-05-29 ENCOUNTER — Other Ambulatory Visit: Payer: Self-pay | Admitting: *Deleted

## 2018-05-29 MED ORDER — ADALIMUMAB 40 MG/0.4ML ~~LOC~~ AJKT: 40.0000 mg | AUTO-INJECTOR | SUBCUTANEOUS | 0 refills | Status: DC

## 2018-05-29 NOTE — Telephone Encounter (Signed)
Refill request received via fax from [facility]   Last Visit: 04/03/18 Next Visit: 06/09/18 Labs: 04/03/18 WNL TB Gold: 12/23/17 Neg   Okay to refill per Dr. Estanislado Pandy

## 2018-06-09 ENCOUNTER — Encounter: Payer: Self-pay | Admitting: Physician Assistant

## 2018-06-09 ENCOUNTER — Ambulatory Visit (INDEPENDENT_AMBULATORY_CARE_PROVIDER_SITE_OTHER): Payer: Medicare Other | Admitting: Physician Assistant

## 2018-06-09 ENCOUNTER — Ambulatory Visit (INDEPENDENT_AMBULATORY_CARE_PROVIDER_SITE_OTHER): Payer: Medicare Other

## 2018-06-09 VITALS — BP 157/91 | HR 102 | Resp 14 | Ht 67.0 in | Wt 204.0 lb

## 2018-06-09 DIAGNOSIS — M47819 Spondylosis without myelopathy or radiculopathy, site unspecified: Secondary | ICD-10-CM | POA: Diagnosis not present

## 2018-06-09 DIAGNOSIS — M7022 Olecranon bursitis, left elbow: Secondary | ICD-10-CM

## 2018-06-09 DIAGNOSIS — H209 Unspecified iridocyclitis: Secondary | ICD-10-CM

## 2018-06-09 DIAGNOSIS — M19041 Primary osteoarthritis, right hand: Secondary | ICD-10-CM

## 2018-06-09 DIAGNOSIS — Z8659 Personal history of other mental and behavioral disorders: Secondary | ICD-10-CM | POA: Diagnosis not present

## 2018-06-09 DIAGNOSIS — Z87891 Personal history of nicotine dependence: Secondary | ICD-10-CM

## 2018-06-09 DIAGNOSIS — M457 Ankylosing spondylitis of lumbosacral region: Secondary | ICD-10-CM

## 2018-06-09 DIAGNOSIS — M170 Bilateral primary osteoarthritis of knee: Secondary | ICD-10-CM | POA: Diagnosis not present

## 2018-06-09 DIAGNOSIS — Z8709 Personal history of other diseases of the respiratory system: Secondary | ICD-10-CM

## 2018-06-09 DIAGNOSIS — Z8679 Personal history of other diseases of the circulatory system: Secondary | ICD-10-CM | POA: Diagnosis not present

## 2018-06-09 DIAGNOSIS — M898X5 Other specified disorders of bone, thigh: Secondary | ICD-10-CM

## 2018-06-09 DIAGNOSIS — Z79899 Other long term (current) drug therapy: Secondary | ICD-10-CM

## 2018-06-09 DIAGNOSIS — M19042 Primary osteoarthritis, left hand: Secondary | ICD-10-CM

## 2018-06-09 MED ORDER — DICLOFENAC SODIUM 1 % TD GEL: TRANSDERMAL | 3 refills | Status: DC

## 2018-06-09 NOTE — Patient Instructions (Signed)
Standing Labs We placed an order today for your standing lab work.    Please come back and get your standing labs in December and every 3 months   We have open lab Monday through Friday from 8:30-11:30 AM and 1:30-4:00 PM  at the office of Dr. Shaili Deveshwar.   You may experience shorter wait times on Monday and Friday afternoons. The office is located at [address], [address], [place], [place] [address] No appointment is necessary.   Labs are drawn by [facility].  You may receive a bill from [facility] for your lab work. If you have any questions regarding directions or hours of operation,  please call [phone].   Just as a reminder please drink plenty of water prior to coming for your lab work. Thanks!   

## 2018-06-10 ENCOUNTER — Telehealth: Payer: Self-pay | Admitting: *Deleted

## 2018-06-10 NOTE — Telephone Encounter (Signed)
Pt called triage due to hight BP. Pt said she saw rheumatologist yesterday and her BP was high (in Epic), pt said that [facility] and [facility] was 180[facility]98. Pt isn't having any sxs not SOB, no CP, no HA, no dizziness. Spoke with Webb Silversmith, NP regarding pt since Dr. Silvio Pate isn't here and she advise to have pt f/u Friday [facility]) since no sxs and give pt ER precautions. appt scheduled with Dr. Quay Burow Friday and ER precautions given.   Routed to Dr. Silvio Pate as an Juluis Rainier and Dr. Quay Burow who will see pt Friday

## 2018-06-12 ENCOUNTER — Encounter: Payer: Self-pay | Admitting: Internal Medicine

## 2018-06-12 ENCOUNTER — Ambulatory Visit (INDEPENDENT_AMBULATORY_CARE_PROVIDER_SITE_OTHER): Payer: Medicare Other | Admitting: Internal Medicine

## 2018-06-12 VITALS — BP 150/86 | HR 103 | Temp 99.4°F | Resp 16 | Wt 197.0 lb

## 2018-06-12 DIAGNOSIS — I10 Essential (primary) hypertension: Secondary | ICD-10-CM

## 2018-06-12 MED ORDER — AMLODIPINE BESYLATE 2.5 MG PO TABS: 2.5000 mg | ORAL_TABLET | Freq: Every day | ORAL | 3 refills | Status: DC

## 2018-06-12 NOTE — Progress Notes (Signed)
Subjective:    Patient ID: Kirsten Harper, female    DOB: 1946/06/21, 72 y.o.   MRN: [id number]  HPI The patient is here for an acute visit.  She fell on 11/13.  She does have increased pain since the fall.    Elevated blood pressure: She saw her rheumatologist a couple of days ago and blood pressure was high, 157/91.  [facility] and [facility] 180[facility]98.  She was not having any symptoms at that time, including SOB, chest pain, headaches and dizziness.  She has not been compliant with a low sodium diet - she eats a lot of processed food, which is not new.  She has ankylosing spondylitis and is not able to exercise.  She has gained 20 lbs since February.  She states she has been eating whatever she wants.  She is not able to exercise with her ankylosing spondylitis.    She is taking her medication daily as prescribed.  She does not have a blood pressure cuff to monitor her blood pressure at home, but [facility].  She denies any chest pain, palpitations, shortness of breath more than her chronic shortness of breath, headaches and lightheadedness.  Medications and allergies reviewed with patient and updated if appropriate.  Patient Active Problem List   Diagnosis Date Noted  . Preventative health care 05/04/2018  . Advance directive discussed with patient 05/04/2018  . RLS (restless legs syndrome) 04/25/2017  . Mood disorder ([place]) 04/08/2017  . Sensory loss 04/08/2017  . COPD (chronic obstructive pulmonary disease) ([place]) 04/08/2017  . Ankylosing spondylitis of lumbosacral region ([place]) 09/24/2016  . GAD (generalized anxiety disorder) 08/27/2016  . 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 Spondyloarthropathy HLA-B27 positive 06/06/2016  . Iritis 06/06/2016  . High risk medication use 06/06/2016  . Primary osteoarthritis of both hands 06/06/2016  . Smoker 06/06/2016  . COPD exacerbation ([place]) 04/10/2016  . Anxiety 04/10/2016  .  Essential hypertension     Current Outpatient Medications on File Prior to Visit  Medication Sig Dispense Refill  . Adalimumab (HUMIRA PEN) 40 MG/0.4ML PNKT Inject 40 mg into the skin every 14 (fourteen) days. 3 each 0  . albuterol (VENTOLIN HFA) 108 (90 Base) MCG/ACT inhaler INHALE 2 PUFFS INTO THE LUNGS EVERY 4 HOURS AS NEEDED FOR WHEEZING/ FOR SHORTNESS OF BREATH 18 Inhaler 1  . amitriptyline (ELAVIL) 25 MG tablet 50 mg.     . clonazePAM (KLONOPIN) 0.5 MG tablet Take 0.5 mg by mouth as needed.     . diclofenac sodium (VOLTAREN) 1 % GEL Apply 3 grams to 3 large joints up to 3 times daily 3 Tube 3  . dorzolamide-timolol (COSOPT) 22.3-6.8 MG/ML ophthalmic solution Place 1 drop into both eyes daily.     Marland Kitchen losartan (COZAAR) 100 MG tablet Take 100 mg by mouth daily.     Marland Kitchen LOTEMAX 0.5 % ophthalmic suspension Place 1 drop into both eyes daily.     Marland Kitchen rOPINIRole (REQUIP) 1 MG tablet TAKE 3 TABLETS (3 MG TOTAL) BY MOUTH AT BEDTIME. 270 tablet 3  . sulindac (CLINORIL) 150 MG tablet Take 1 tablet (150 mg total) by mouth 2 (two) times daily as needed. 60 tablet 2   No current facility-administered medications on file prior to visit.     Past Medical History:  Diagnosis Date  . Ankylosing spondylitis ([place])   . Anxiety   . Arthritis   . Chronic lower  back pain   . CKD (chronic kidney disease), stage II    "related to the Ankylosing spondylitis" (04/10/2016)  . COPD (chronic obstructive pulmonary disease) ([place])   . Eye disease   . Hypertension   . PONV (postoperative nausea and vomiting)    "didn't bother me the last time I had it in ~ 2015"    Past Surgical History:  Procedure Laterality Date  . BROW LIFT AND BLEPHAROPLASTY Bilateral ~ 2015  . CATARACT EXTRACTION W/ INTRAOCULAR LENS  IMPLANT, BILATERAL Bilateral 2000s  . MANDIBLE SURGERY  ~ 2000   "jaw had moved to left; broke my jaw in 5 places; put titanium in; stripped muscle from bone; wired jaw shut"  . TUBAL LIGATION  1979  . VAGINAL  HYSTERECTOMY  1985    Social History   Socioeconomic History  . Marital status: Divorced    Spouse name: Not on file  . Number of children: 2  . Years of education: Not on file  . Highest education level: Not on file  Occupational History  . Occupation: Sales promotion account executive for [employer]    Comment: Retired  Scientific laboratory technician  . Financial resource strain: Not on file  . Food insecurity:    Worry: Not on file    Inability: Not on file  . Transportation needs:    Medical: Not on file    Non-medical: Not on file  Tobacco Use  . Smoking status: Former Smoker    Packs/day: 0.50    Years: 51.00    Pack years: 25.50    Types: Cigarettes  . Smokeless tobacco: Never Used  Substance and Sexual Activity  . Alcohol use: Yes    Alcohol/week: 0.0 standard drinks    Comment: occ  . Drug use: No  . Sexual activity: Never  Lifestyle  . Physical activity:    Days per week: Not on file    Minutes per session: Not on file  . Stress: Not on file  Relationships  . Social connections:    Talks on phone: Not on file    Gets together: Not on file    Attends religious service: Not on file    Active member of club or organization: Not on file    Attends meetings of clubs or organizations: Not on file    Relationship status: Not on file  Other Topics Concern  . Not on file  Social History Narrative   2 sons--not in area   Lives on property with her mother [name]      Has living will   Would want one of her sons, [name], to be Media planner   Requests DNR   No tube feeds    Family History  Problem Relation Age of Onset  . Hypertension Mother   . Heart disease Sister   . Diabetes Sister   . Pulmonary fibrosis Brother   . Idiopathic pulmonary fibrosis Brother   . Alzheimer's disease Brother   . Dementia Brother     Review of Systems  Constitutional: Negative for fever.  Respiratory: Positive for shortness of breath (chronic - at times - no change). Negative for cough and  wheezing.   Cardiovascular: Negative for chest pain, palpitations and leg swelling.  Neurological: Negative for light-headedness and headaches.       Objective:   Vitals:   06/12/18 1014  BP: (!) 150/86  Pulse: (!) 103  Resp: 16  Temp: 99.4 F (37.4 C)  SpO2: 97%   BP Readings  from Last 3 Encounters:  06/12/18 (!) 150/86  06/09/18 (!) 157/91  05/04/18 128/84   Wt Readings from Last 3 Encounters:  06/12/18 197 lb (89.4 kg)  06/09/18 204 lb (92.5 kg)  05/04/18 200 lb (90.7 kg)   Body mass index is 30.85 kg/m.   Physical Exam    Constitutional: Appears well-developed and well-nourished. No distress.  HENT:  Head: Normocephalic and atraumatic.  Neck: Neck supple. No tracheal deviation present. No thyromegaly present.  No cervical lymphadenopathy Cardiovascular: Normal rate, regular rhythm and normal heart sounds.   No murmur heard. No carotid bruit .  No edema Pulmonary/Chest: Effort normal and breath sounds normal. No respiratory distress. No has no wheezes. No rales.  Skin: Skin is warm and dry. Not diaphoretic.  Psychiatric: Normal mood and affect. Behavior is normal.       Assessment & Plan:    See Problem List for Assessment and Plan of chronic medical problems.

## 2018-06-12 NOTE — Assessment & Plan Note (Signed)
Currently not controlled, which may be multifactorial She fell recently and is having increased pain which is likely contributing She has gained weight over the past several months and is not compliant with a low-sodium diet-both of which may also be contributing Will add amlodipine 2.5 mg daily Continue losartan 100 mg daily Ad[facility] and [facility] Follow-up with PCP in 1-2 weeks, sooner if needed

## 2018-06-12 NOTE — Patient Instructions (Addendum)
  Medications reviewed and updated.  Changes include :   Starting amlodipine 2.5 mg daily  Your prescription(s) have been submitted to your pharmacy. Please take as directed and contact our office if you believe you are having problem(s) with the medication(s).  Monitor your BP at home and keep a log.   Eat a low sodium diet, work on decreasing your portions and work on weight loss.    Please followup with Dr Silvio Pate to recheck your blood pressure in 1-2 weeks.

## 2018-06-14 NOTE — Telephone Encounter (Signed)
Please check on her and set up follow up next week or the week I get back

## 2018-06-15 ENCOUNTER — Telehealth: Payer: Self-pay | Admitting: *Deleted

## 2018-06-15 NOTE — Telephone Encounter (Signed)
Pt has an appt with Dr Silvio Pate tomorrow. Left a message to see how she was doing.

## 2018-06-15 NOTE — Telephone Encounter (Signed)
Patient called and said she took her blood pressure about 10 minutes ago and it was 157/105.  Patient said she had  taken Losartin at around [place]. Patient fell on 05/27/18 and she's having hip,knee, and shoulder pain and she said her blood pressure may have increased because she's in pain.Patient wants to make sure it's okay for her to wait until tomorrow for an appointment.

## 2018-06-15 NOTE — Telephone Encounter (Signed)
I agree that her pain is probably a big part of the elevated BP. If she is not having any chest pain or SOB, it should be okay to wait for tomorrow (especially since she was already evaluated over the weekend)

## 2018-06-15 NOTE — Telephone Encounter (Signed)
Spoke to pt who states she was recently seen for her BP on 11/29 and was given a new Rx for amlodipine. Pt states she checked her BP this am and the reading was 157/105. I advised her based on that reading she would need to go to the ED or urgent care. Pt declined stating she was just seen and was only wanting to f/u with Dr Silvio Pate. She denies any ShoB, chest pain or dizziness and requested a message be sent to Dr Silvio Pate to for his opinion. Pt scheduled for 12/3, pls advise

## 2018-06-15 NOTE — Telephone Encounter (Signed)
See TE created today.

## 2018-06-15 NOTE — Telephone Encounter (Signed)
Left detailed message on VM per DPR. 

## 2018-06-16 ENCOUNTER — Ambulatory Visit (INDEPENDENT_AMBULATORY_CARE_PROVIDER_SITE_OTHER): Payer: Medicare Other | Admitting: Internal Medicine

## 2018-06-16 ENCOUNTER — Encounter: Payer: Self-pay | Admitting: Internal Medicine

## 2018-06-16 VITALS — BP 118/84 | HR 90 | Temp 99.0°F | Ht 67.0 in | Wt 194.0 lb

## 2018-06-16 DIAGNOSIS — I10 Essential (primary) hypertension: Secondary | ICD-10-CM | POA: Diagnosis not present

## 2018-06-16 MED ORDER — AMLODIPINE BESYLATE 5 MG PO TABS: 5.0000 mg | ORAL_TABLET | Freq: Every day | ORAL | 3 refills | Status: DC

## 2018-06-16 NOTE — Progress Notes (Signed)
Subjective:    Patient ID: Kirsten Harper, female    DOB: 06-29-1946, 72 y.o.   MRN: [id number]  HPI Here for review of her BP Had stayed with neighbor for 3 weeks after her TKR [place] when with her in [place] Injured knees and ankylosing spondylitis was worse----saw rheumatologist Asked for cortisone shot in knees and told they couldn't do it due to BP being up (did have "gel" injection into knees) [facility]ed it at [facility] and still high Called her and saw Dr Quay Burow last week and started low dose amlodipine She doubled this when her old wrist machine still registering high  Has tried to eat better Keeping up with water intake  Current Outpatient Medications on File Prior to Visit  Medication Sig Dispense Refill  . albuterol (VENTOLIN HFA) 108 (90 Base) MCG/ACT inhaler INHALE 2 PUFFS INTO THE LUNGS EVERY 4 HOURS AS NEEDED FOR WHEEZING/ FOR SHORTNESS OF BREATH 18 Inhaler 1  . amitriptyline (ELAVIL) 25 MG tablet 50 mg.     . amLODipine (NORVASC) 2.5 MG tablet Take 1 tablet (2.5 mg total) by mouth daily. 30 tablet 3  . clonazePAM (KLONOPIN) 0.5 MG tablet Take 0.5 mg by mouth as needed.     . diclofenac sodium (VOLTAREN) 1 % GEL Apply 3 grams to 3 large joints up to 3 times daily 3 Tube 3  . dorzolamide-timolol (COSOPT) 22.3-6.8 MG/ML ophthalmic solution Place 1 drop into both eyes daily.     Marland Kitchen losartan (COZAAR) 100 MG tablet Take 100 mg by mouth daily.     Marland Kitchen LOTEMAX 0.5 % ophthalmic suspension Place 1 drop into both eyes daily.     Marland Kitchen rOPINIRole (REQUIP) 1 MG tablet TAKE 3 TABLETS (3 MG TOTAL) BY MOUTH AT BEDTIME. 270 tablet 3  . sulindac (CLINORIL) 150 MG tablet Take 1 tablet (150 mg total) by mouth 2 (two) times daily as needed. 60 tablet 2  . Adalimumab (HUMIRA PEN) 40 MG/0.4ML PNKT Inject 40 mg into the skin every 14 (fourteen) days. (Patient not taking: Reported on 06/16/2018) 3 each 0   No current facility-administered medications on file prior to visit.     Allergies  Allergen  Reactions  . Amoxapine And Related     Patient reports it did not work for her.     Past Medical History:  Diagnosis Date  . Ankylosing spondylitis ([place])   . Anxiety   . Arthritis   . Chronic lower back pain   . CKD (chronic kidney disease), stage II    "related to the Ankylosing spondylitis" (04/10/2016)  . COPD (chronic obstructive pulmonary disease) ([place])   . Eye disease   . Hypertension   . PONV (postoperative nausea and vomiting)    "didn't bother me the last time I had it in ~ 2015"    Past Surgical History:  Procedure Laterality Date  . BROW LIFT AND BLEPHAROPLASTY Bilateral ~ 2015  . CATARACT EXTRACTION W/ INTRAOCULAR LENS  IMPLANT, BILATERAL Bilateral 2000s  . MANDIBLE SURGERY  ~ 2000   "jaw had moved to left; broke my jaw in 5 places; put titanium in; stripped muscle from bone; wired jaw shut"  . TUBAL LIGATION  1979  . VAGINAL HYSTERECTOMY  1985    Family History  Problem Relation Age of Onset  . Hypertension Mother   . Heart disease Sister   . Diabetes Sister   . Pulmonary fibrosis Brother   . Idiopathic pulmonary fibrosis Brother   . Alzheimer's disease Brother   .  Dementia Brother     Social History   Socioeconomic History  . Marital status: Divorced    Spouse name: Not on file  . Number of children: 2  . Years of education: Not on file  . Highest education level: Not on file  Occupational History  . Occupation: Sales promotion account executive for [employer]    Comment: Retired  Scientific laboratory technician  . Financial resource strain: Not on file  . Food insecurity:    Worry: Not on file    Inability: Not on file  . Transportation needs:    Medical: Not on file    Non-medical: Not on file  Tobacco Use  . Smoking status: Former Smoker    Packs/day: 0.50    Years: 51.00    Pack years: 25.50    Types: Cigarettes  . Smokeless tobacco: Never Used  Substance and Sexual Activity  . Alcohol use: Yes    Alcohol/week: 0.0 standard drinks    Comment: occ  . Drug use: No    . Sexual activity: Never  Lifestyle  . Physical activity:    Days per week: Not on file    Minutes per session: Not on file  . Stress: Not on file  Relationships  . Social connections:    Talks on phone: Not on file    Gets together: Not on file    Attends religious service: Not on file    Active member of club or organization: Not on file    Attends meetings of clubs or organizations: Not on file    Relationship status: Not on file  . Intimate partner violence:    Fear of current or ex partner: Not on file    Emotionally abused: Not on file    Physically abused: Not on file    Forced sexual activity: Not on file  Other Topics Concern  . Not on file  Social History Narrative   2 sons--not in area   Lives on property with her mother [name]      Has living will   Would want one of her sons, [name], to be Media planner   Requests DNR   No tube feeds   Review of Systems No headache No chest pain Stable DOE from her COPD    Objective:   Physical Exam  Constitutional: She appears well-developed. No distress.  Neck: No thyromegaly present.  Cardiovascular: Normal rate, regular rhythm and normal heart sounds. Exam reveals no gallop.  No murmur heard. Respiratory: Effort normal and breath sounds normal. No respiratory distress. She has no wheezes. She has no rales.  Musculoskeletal: She exhibits no edema.  Lymphadenopathy:    She has no cervical adenopathy.           Assessment & Plan:

## 2018-06-16 NOTE — Assessment & Plan Note (Signed)
BP Readings from Last 3 Encounters:  06/16/18 118/84  06/12/18 (!) 150/86  06/09/18 (!) 157/91   Repeat 120/70 on right Her elevations were likely related to pain but tolerating the amlodipine Will continue this

## 2018-06-18 ENCOUNTER — Ambulatory Visit: Payer: Medicare Other | Attending: Physician Assistant | Admitting: Rehabilitation

## 2018-06-18 DIAGNOSIS — M6281 Muscle weakness (generalized): Secondary | ICD-10-CM | POA: Insufficient documentation

## 2018-06-18 DIAGNOSIS — R293 Abnormal posture: Secondary | ICD-10-CM | POA: Insufficient documentation

## 2018-06-18 DIAGNOSIS — R2689 Other abnormalities of gait and mobility: Secondary | ICD-10-CM | POA: Insufficient documentation

## 2018-06-18 DIAGNOSIS — R2681 Unsteadiness on feet: Secondary | ICD-10-CM | POA: Insufficient documentation

## 2018-06-19 ENCOUNTER — Encounter: Payer: Self-pay | Admitting: Rehabilitation

## 2018-06-19 ENCOUNTER — Other Ambulatory Visit: Payer: Self-pay

## 2018-06-19 ENCOUNTER — Ambulatory Visit: Payer: Medicare Other | Admitting: Rehabilitation

## 2018-06-19 DIAGNOSIS — R293 Abnormal posture: Secondary | ICD-10-CM | POA: Diagnosis not present

## 2018-06-19 DIAGNOSIS — R2681 Unsteadiness on feet: Secondary | ICD-10-CM | POA: Diagnosis not present

## 2018-06-19 DIAGNOSIS — R2689 Other abnormalities of gait and mobility: Secondary | ICD-10-CM | POA: Diagnosis not present

## 2018-06-19 DIAGNOSIS — M6281 Muscle weakness (generalized): Secondary | ICD-10-CM | POA: Diagnosis not present

## 2018-06-19 NOTE — Therapy (Signed)
[facility] [address] [place], [place], [address] Phone: [phone]   Fax:  [phone]  Physical Therapy Evaluation  Patient Details  Name: Kirsten Harper MRN: [id number] Date of Birth: 1946/05/05 Referring Provider (PT): Hazel Sams, PA-C   Encounter Date: 06/19/2018  PT End of Session - 06/19/18 1029    Visit Number  1    Number of Visits  17    Date for PT Re-Evaluation  30/16/01   cert written for 90 days, POC for 60 days   Authorization Type  Medicare/[employer]-10th visit progress note needed     PT Start Time  0930    PT Stop Time  1017    PT Time Calculation (min)  47 min    Activity Tolerance  Patient tolerated treatment well    Behavior During Therapy  [facility] for tasks assessed/performed       Past Medical History:  Diagnosis Date  . Ankylosing spondylitis ([place])   . Anxiety   . Arthritis   . Chronic lower back pain   . CKD (chronic kidney disease), stage II    "related to the Ankylosing spondylitis" (04/10/2016)  . COPD (chronic obstructive pulmonary disease) ([place])   . Eye disease   . Hypertension   . PONV (postoperative nausea and vomiting)    "didn't bother me the last time I had it in ~ 2015"    Past Surgical History:  Procedure Laterality Date  . BROW LIFT AND BLEPHAROPLASTY Bilateral ~ 2015  . CATARACT EXTRACTION W/ INTRAOCULAR LENS  IMPLANT, BILATERAL Bilateral 2000s  . MANDIBLE SURGERY  ~ 2000   "jaw had moved to left; broke my jaw in 5 places; put titanium in; stripped muscle from bone; wired jaw shut"  . TUBAL LIGATION  1979  . VAGINAL HYSTERECTOMY  1985    There were no vitals filed for this visit.   Subjective Assessment - 06/19/18 0938    Subjective  "I had a fall in early November and fell on my knees and they are still pretty painful.  Because of my ankylosing spondylitis I tend to have pain that comes and goes (shoulders, back, wrists, hands, feet)"      Pertinent  History  HTN, COPD, CKD, chronic back pain     Limitations  Walking;Standing;House hold activities    How long can you stand comfortably?  about 30 mins (not comfortable)     How long can you walk comfortably?  No more than 30 mins    Patient Stated Goals  "To decrease my pain."          [facility] PT Assessment - 06/19/18 0943      Assessment   Medical Diagnosis  Ankylosing Spondylitis, B knee pain    Referring Provider (PT)  Hazel Sams, PA-C    Onset Date/Surgical Date  --   Increased w/ fall 11/13, had ankylosing spon for 32 yrs     Precautions   Precautions  Fall    Precaution Comments  Ankylosing Spondylitis      Restrictions   Weight Bearing Restrictions  No      Balance Screen   Has the patient fallen in the past 6 months  Yes    How many times?  2    Has the patient had a decrease in activity level because of a fear of falling?   Yes    Is the patient reluctant to leave their home because of a fear of  falling?   Yes      [place] residence    Living Arrangements  Alone    Available Help at Discharge  Family   elder parents   Type of [place] to enter    Entrance Stairs-Number of Steps  [place]  Can reach both;Right;Left    Home Layout  One level    [place] - 2 wheels      Prior Function   Level of Independence  Independent with basic ADLs    Vocation  Retired    Leisure  Reading, enjoys Theme park manager   Overall Cognitive Status  Within Functional Limits for tasks assessed      Sensation   Light Touch  Appears Intact    Proprioception  Appears Intact      Coordination   Gross Motor Movements are Fluid and Coordinated  Yes    Fine Motor Movements are Fluid and Coordinated  No    Heel Shin Test  [facility]       Posture/Postural Control   Posture Comments  Pt sits and ambulates with forward flexed posture (hinged at hips and "locked" into this position due  to AS.        ROM / Strength   AROM / PROM / Strength  Strength      Strength   Overall Strength  Deficits    Overall Strength Comments  B hip flex 3+/5, B knee ext 4/5 (L knee unable to achieve full ext), B knee flex 4/5, B ankle DF 4/5, seated hip abd/add 4/5       Transfers   Transfers  Sit to Stand;Stand to Sit    Sit to Stand  6: Modified independent (Device/Increase time)    Five time sit to stand comments   20.32 secs without UE support    Stand to Sit  6: Modified independent (Device/Increase time)      Ambulation/Gait   Ambulation/Gait  Yes    Ambulation/Gait Assistance  5: Supervision;6: Modified independent (Device/Increase time)    Ambulation/Gait Assistance Details  S for safety during balance challenges.   Pt reports she feels that L knee is weak, but doesn't bukle during session.      Ambulation Distance (Feet)  300 Feet    Assistive device  None    Gait Pattern  Step-through pattern;Decreased stride length;Decreased trunk rotation;Trunk flexed    Ambulation Surface  Level;Indoor    Gait velocity  3.18 ft/sec without AD (S for safety)    Stairs  Yes    Stairs Assistance  4: Min guard;5: Supervision    Stairs Assistance Details (indicate cue type and reason)  S to min/guard for safety as pt attempted to not use handrails on last step.      Stair Management Technique  Two rails;Alternating pattern;Forwards    Number of Stairs  4    Height of Stairs  6      Standardized Balance Assessment   Standardized Balance Assessment  Dynamic Gait Index      Dynamic Gait Index   Level Surface  Normal    Change in Gait Speed  Mild Impairment    Gait with Horizontal Head Turns  Normal    Gait with Vertical Head Turns  Mild Impairment    Gait and Pivot Turn  Mild Impairment  Step Over Obstacle  Mild Impairment    Step Around Obstacles  Normal    Steps  Mild Impairment    Total Score  19    DGI comment:  Scores of 19 or less indicative of high fall risk                 Objective measurements completed on examination: See above findings.              PT Education - 06/19/18 1027    Education Details  Educated on evaluation findings, POC, goals, starting aquatic PT    Person(s) Educated  Patient    Methods  Explanation    Comprehension  Verbalized understanding       PT Short Term Goals - 06/19/18 1045      PT SHORT TERM GOAL #1   Title  Pt will initiate HEP in order to indicate improved functional mobility and decreased fall risk.  (Target Date: 07/19/18)    Time  4    Period  Weeks    Target Date  07/19/18      PT SHORT TERM GOAL #2   Title  Pt will improve 5TSS to </=17 secs without UE support in order to indicate improved strength and decreased fall risk.      Time  4    Period  Weeks    Status  New      PT SHORT TERM GOAL #3   Title  Pt will improve DGI to >/=21/24 in order to indicate decreased fall risk.     Time  4    Period  Weeks    Status  New      PT SHORT TERM GOAL #4   Title  Will assess FGA and set appropriate LTG to indicate decreased fall risk.     Time  4    Period  Weeks    Status  New      PT SHORT TERM GOAL #5   Title  Pt will ambulate x 500' outdoors over unlevel paved surfaces w/ LRAD at mod I level in order to indicate safe community negotiation.         PT Long Term Goals - 06/19/18 1049      PT LONG TERM GOAL #1   Title  Pt will be independent with final HEP (both land and pool) in order to indicate improved functional mobility and decreased fall risk.  (Target Date: 08/18/18)    Time  8    Period  Weeks    Status  New    Target Date  08/18/18      PT LONG TERM GOAL #2   Title  Pt will perform 5TSS in </=14 secs w/o UE support in order to indicate improved BLE strength and decreased fall risk.      Time  8    Period  Weeks    Status  New      PT LONG TERM GOAL #3   Title  Pt will improve gait speed to >/=3.68 ft/sec in order to indicate improved efficiency of gait.       Time  8    Period  Weeks    Status  New      PT LONG TERM GOAL #4   Title  Pt will ambulate 1000' over paved and unlevel grassy surfaces w/ LRAD at mod I level in order to indicate safe leisure and home negotiation.      Time  8    Period  Weeks    Status  New             Plan - 06/19/18 1031    Clinical Impression Statement  Pt presents with diagnosis of ankylosing spondylitis for 32 years (undiagnosed another 10 years) with recent fall in November to B knees.  She reports increased knee pain that has gotten slightly better over the last week but still reports that L knee feels as though its going to "give way" at any time.  She has started using compression sleeves on B knees with improved comfort and stability reported.  Note history of HTN, COPD, CKD and chronic back pain.  During evaluation, pt notes that she is affected gobally from AS (shoulders, wrists, feet-toe joints are all fused, back).  Upon PT evaluation, note generalized BLE weakness (L>R), gait speed of 3.18 ft/sec without AD but at S level which is below normal for her age group but does indicate safe community ambulation, 5TSS time of 20.32 secs w/o UE support which is indicative of elevated fall risk and decreased functional strength in BLEs, and DGI score of 19/24 indicative of elevated fall risk.  Discussed possible benefits of aquatic PT and pt is very interested.  PT to get with aquatic PT to discuss getting on her schedule.  Pt verbalized understanding. Pt will benefit from skilled OP neuro PT in order to address deficits.      History and Personal Factors relevant to plan of care:  see above    Clinical Presentation  Evolving    Clinical Presentation due to:  see above    Clinical Decision Making  Moderate    Rehab Potential  Good    Clinical Impairments Affecting Rehab Potential  Disease process    PT Frequency  2x / week    PT Duration  8 weeks    PT Treatment/Interventions  ADLs/Self Care Home  Management;Aquatic Therapy;DME Instruction;Gait training;Stair training;Functional mobility training;Therapeutic activities;Therapeutic exercise;Balance training;Neuromuscular re-education;Patient/family education;Orthotic Fit/Training;Passive range of motion;Energy conservation;Vestibular    PT Next Visit Plan  FGA-add goal, get with Vinnie Level about pool schedule, initiate BLE strength/core strength HEP, balance (compensate for decreased mobility in feet)    Consulted and Agree with Plan of Care  Patient       Patient will benefit from skilled therapeutic intervention in order to improve the following deficits and impairments:  Abnormal gait, Decreased activity tolerance, Decreased balance, Decreased endurance, Decreased knowledge of use of DME, Decreased mobility, Decreased range of motion, Decreased strength, Difficulty walking, Hypomobility, Impaired perceived functional ability, Impaired flexibility, Postural dysfunction  Visit Diagnosis: Muscle weakness (generalized)  Unsteadiness on feet  Other abnormalities of gait and mobility  Abnormal posture     Problem List Patient Active Problem List   Diagnosis Date Noted  . Preventative health care 05/04/2018  . Advance directive discussed with patient 05/04/2018  . RLS (restless legs syndrome) 04/25/2017  . Mood disorder ([place]) 04/08/2017  . Sensory loss 04/08/2017  . COPD (chronic obstructive pulmonary disease) ([place]) 04/08/2017  . Ankylosing spondylitis of lumbosacral region ([place]) 09/24/2016  . GAD (generalized anxiety disorder) 08/27/2016  . 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 Spondyloarthropathy HLA-B27 positive 06/06/2016  . Iritis 06/06/2016  . High risk medication use 06/06/2016  . Primary osteoarthritis of both hands 06/06/2016  . Smoker 06/06/2016  . COPD exacerbation ([place]) 04/10/2016  . Anxiety 04/10/2016  . Essential hypertension     Cameron Sprang, PT, MPT [facility]  [facility] [address] [place], [place], [address] Phone: [phone]   Fax:  [phone] 06/19/18, 10:56 AM  Name: Kirsten Harper MRN: [id number] Date of Birth: 1945/08/19

## 2018-06-24 ENCOUNTER — Other Ambulatory Visit: Payer: Self-pay | Admitting: Internal Medicine

## 2018-06-24 DIAGNOSIS — J449 Chronic obstructive pulmonary disease, unspecified: Secondary | ICD-10-CM

## 2018-06-25 ENCOUNTER — Ambulatory Visit: Payer: Medicare Other | Admitting: Physical Therapy

## 2018-06-25 ENCOUNTER — Encounter: Payer: Self-pay | Admitting: Physical Therapy

## 2018-06-25 DIAGNOSIS — R2689 Other abnormalities of gait and mobility: Secondary | ICD-10-CM | POA: Diagnosis not present

## 2018-06-25 DIAGNOSIS — R2681 Unsteadiness on feet: Secondary | ICD-10-CM | POA: Diagnosis not present

## 2018-06-25 DIAGNOSIS — R293 Abnormal posture: Secondary | ICD-10-CM | POA: Diagnosis not present

## 2018-06-25 DIAGNOSIS — M6281 Muscle weakness (generalized): Secondary | ICD-10-CM | POA: Diagnosis not present

## 2018-06-25 NOTE — Patient Instructions (Signed)
Access Code: [email or web address]  URL: [email or web address]  Date: 06/25/2018  Prepared by: Willow Ora   Exercises  Tandem Walking with Counter Support - 3 reps - 1 sets - 1x daily - 5x weekly  Romberg Stance with Eyes Closed - 3 reps - 1 sets - 30 hold - 1x daily - 5x weekly  Wide Stance with Eyes Closed and Head Rotation - 10 reps - 1 sets - 1x daily - 5x weekly  Wide Stance with Eyes Closed and Head Nods - 10 reps - 1 sets - 1x daily - 5x weekly

## 2018-06-26 ENCOUNTER — Ambulatory Visit: Payer: Medicare Other | Admitting: Physical Therapy

## 2018-06-26 ENCOUNTER — Encounter: Payer: Self-pay | Admitting: Physical Therapy

## 2018-06-26 VITALS — BP 110/72

## 2018-06-26 DIAGNOSIS — R2689 Other abnormalities of gait and mobility: Secondary | ICD-10-CM | POA: Diagnosis not present

## 2018-06-26 DIAGNOSIS — R293 Abnormal posture: Secondary | ICD-10-CM

## 2018-06-26 DIAGNOSIS — M6281 Muscle weakness (generalized): Secondary | ICD-10-CM

## 2018-06-26 DIAGNOSIS — R2681 Unsteadiness on feet: Secondary | ICD-10-CM | POA: Diagnosis not present

## 2018-06-26 NOTE — Therapy (Signed)
[facility] [address] [place], [place], [address] Phone: [phone]   Fax:  [phone]  Physical Therapy Treatment  Patient Details  Name: Kirsten Harper MRN: [id number] Date of Birth: 1946-05-10 Referring Provider (PT): Hazel Sams, PA-C   Encounter Date: 06/25/2018  PT End of Session - 06/25/18 1540    Visit Number  2    Number of Visits  17    Date for PT Re-Evaluation  95/63/87   cert written for 90 days, POC for 60 days   Authorization Type  Medicare/[employer]-10th visit progress note needed     PT Start Time  1530    PT Stop Time  1615    PT Time Calculation (min)  45 min    Equipment Utilized During Treatment  Gait belt    Activity Tolerance  Patient tolerated treatment well;No increased pain    Behavior During Therapy  WFL for tasks assessed/performed       Past Medical History:  Diagnosis Date  . Ankylosing spondylitis ([place])   . Anxiety   . Arthritis   . Chronic lower back pain   . CKD (chronic kidney disease), stage II    "related to the Ankylosing spondylitis" (04/10/2016)  . COPD (chronic obstructive pulmonary disease) ([place])   . Eye disease   . Hypertension   . PONV (postoperative nausea and vomiting)    "didn't bother me the last time I had it in ~ 2015"    Past Surgical History:  Procedure Laterality Date  . BROW LIFT AND BLEPHAROPLASTY Bilateral ~ 2015  . CATARACT EXTRACTION W/ INTRAOCULAR LENS  IMPLANT, BILATERAL Bilateral 2000s  . MANDIBLE SURGERY  ~ 2000   "jaw had moved to left; broke my jaw in 5 places; put titanium in; stripped muscle from bone; wired jaw shut"  . TUBAL LIGATION  1979  . VAGINAL HYSTERECTOMY  1985    There were no vitals filed for this visit.  Subjective Assessment - 06/25/18 1533    Subjective  Reports the pain is worse today. Not sure if it's due to the weather. No new falls. Did bring a cane with her (of note it's a wooden cane that's not  adjustable). Pt also wearing sandles to therapy today.     Pertinent History  HTN, COPD, CKD, chronic back pain     Limitations  Walking;Standing;House hold activities    How long can you stand comfortably?  about 30 mins (not comfortable)     How long can you walk comfortably?  No more than 30 mins    Patient Stated Goals  "To decrease my pain."     Currently in Pain?  Yes    Pain Score  6     Pain Location  Generalized   wrists, shoulders, ankles, knees, and back   Pain Descriptors / Indicators  Aching;Dull    Pain Type  Chronic pain    Pain Onset  More than a month ago    Pain Frequency  Constant    Aggravating Factors   ankylosing spondylisis, certain movements, increased mobility    Pain Relieving Factors  resting helps some         [facility] PT Assessment - 06/25/18 1541      Functional Gait  Assessment   Gait assessed   Yes    Gait Level Surface  Walks 20 ft, slow speed, abnormal gait pattern, evidence for imbalance or deviates 10-15 in outside of the 12 in  walkway width. Requires more than 7 sec to ambulate 20 ft.   10.97 sec's   Change in Gait Speed  Able to change speed, demonstrates mild gait deviations, deviates 6-10 in outside of the 12 in walkway width, or no gait deviations, unable to achieve a major change in velocity, or uses a change in velocity, or uses an assistive device.    Gait with Horizontal Head Turns  Performs head turns smoothly with no change in gait. Deviates no more than 6 in outside 12 in walkway width    Gait with Vertical Head Turns  Performs head turns with no change in gait. Deviates no more than 6 in outside 12 in walkway width.    Gait and Pivot Turn  Pivot turns safely in greater than 3 sec and stops with no loss of balance, or pivot turns safely within 3 sec and stops with mild imbalance, requires small steps to catch balance.    Step Over Obstacle  Is able to step over one shoe box (4.5 in total height) but must slow down and adjust steps to clear  box safely. May require verbal cueing.    Gait with Narrow Base of Support  Ambulates less than 4 steps heel to toe or cannot perform without assistance.    Gait with Eyes Closed  Walks 20 ft, slow speed, abnormal gait pattern, evidence for imbalance, deviates 10-15 in outside 12 in walkway width. Requires more than 9 sec to ambulate 20 ft.    Ambulating Backwards  Walks 20 ft, uses assistive device, slower speed, mild gait deviations, deviates 6-10 in outside 12 in walkway width.    Steps  Alternating feet, must use rail.    Total Score  17    FGA comment:  17/30. 19 or less indicated fall risk.            [facility] Adult PT Treatment/Exercise - 06/25/18 1550      Self-Care   Self-Care  Other Self-Care Comments    Other Self-Care Comments   discussed the importance of proper shoe wear for safety with mobility/gait. Provided pt with a card for [employer] to check into more supportive closed heel and toe shoes. Also reinforced the benefits of aquatic therpay due to pt's pain to allow for more strengthening without pain excerbation. The pt agreed to concider this.        Neuro Re-ed    Neuro Re-ed Details   Established a baseline HEP today with pt performing in session with min guard assist for balance, cues on posture and ex form.             PT Short Term Goals - 06/26/18 0929      PT SHORT TERM GOAL #1   Title  Pt will initiate HEP in order to indicate improved functional mobility and decreased fall risk.  (Target Date: 07/19/18)    Time  4    Period  Weeks    Status  On-going      PT SHORT TERM GOAL #2   Title  Pt will improve 5TSS to </=17 secs without UE support in order to indicate improved strength and decreased fall risk.      Time  4    Period  Weeks    Status  On-going      PT SHORT TERM GOAL #3   Title  Pt will improve DGI to >/=21/24 in order to indicate decreased fall risk.     Time  4  Period  Weeks    Status  On-going      PT SHORT TERM GOAL #4   Title   Will assess FGA and set appropriate LTG to indicate decreased fall risk.     Baseline  06/25/18: 17/30 scored as baseline. PT to set goals as indicated.     Time  4    Period  Weeks    Status  Achieved      PT SHORT TERM GOAL #5   Title  Pt will ambulate x 500' outdoors over unlevel paved surfaces w/ LRAD at mod I level in order to indicate safe community negotiation.     Status  On-going        PT Long Term Goals - 06/19/18 1049      PT LONG TERM GOAL #1   Title  Pt will be independent with final HEP (both land and pool) in order to indicate improved functional mobility and decreased fall risk.  (Target Date: 08/18/18)    Time  8    Period  Weeks    Status  New    Target Date  08/18/18      PT LONG TERM GOAL #2   Title  Pt will perform 5TSS in </=14 secs w/o UE support in order to indicate improved BLE strength and decreased fall risk.      Time  8    Period  Weeks    Status  New      PT LONG TERM GOAL #3   Title  Pt will improve gait speed to >/=3.68 ft/sec in order to indicate improved efficiency of gait.      Time  8    Period  Weeks    Status  New      PT LONG TERM GOAL #4   Title  Pt will ambulate 1000' over paved and unlevel grassy surfaces w/ LRAD at mod I level in order to indicate safe leisure and home negotiation.      Time  8    Period  Weeks    Status  New            Plan - 06/25/18 1540    Clinical Impression Statement  Today's skilled session focused on establishment of baseline for Functional Gait Assessment with the primary PT to set goals based on results. Remainder of session address establishment of an HEP to address balance. No issues reported with session. The pt was also educated on proper shoe wear, getting an adjustable cane and benefits of aquatic therapy. The pt is progressing toward goals and should benefit from continued PT to progress toward unmet goals.     Rehab Potential  Good    Clinical Impairments Affecting Rehab Potential  Disease  process    PT Frequency  2x / week    PT Duration  8 weeks    PT Treatment/Interventions  ADLs/Self Care Home Management;Aquatic Therapy;DME Instruction;Gait training;Stair training;Functional mobility training;Therapeutic activities;Therapeutic exercise;Balance training;Neuromuscular re-education;Patient/family education;Orthotic Fit/Training;Passive range of motion;Energy conservation;Vestibular    PT Next Visit Plan  get with Vinnie Level about pool schedule, initiate BLE strength/core strength HEP, balance (compensate for decreased mobility in feet)    Consulted and Agree with Plan of Care  Patient       Patient will benefit from skilled therapeutic intervention in order to improve the following deficits and impairments:  Abnormal gait, Decreased activity tolerance, Decreased balance, Decreased endurance, Decreased knowledge of use of DME, Decreased mobility, Decreased range of motion, Decreased  strength, Difficulty walking, Hypomobility, Impaired perceived functional ability, Impaired flexibility, Postural dysfunction  Visit Diagnosis: Muscle weakness (generalized)  Unsteadiness on feet  Other abnormalities of gait and mobility  Abnormal posture     Problem List Patient Active Problem List   Diagnosis Date Noted  . Preventative health care 05/04/2018  . Advance directive discussed with patient 05/04/2018  . RLS (restless legs syndrome) 04/25/2017  . Mood disorder ([place]) 04/08/2017  . Sensory loss 04/08/2017  . COPD (chronic obstructive pulmonary disease) ([place]) 04/08/2017  . Ankylosing spondylitis of lumbosacral region ([place]) 09/24/2016  . GAD (generalized anxiety disorder) 08/27/2016  . 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 Spondyloarthropathy HLA-B27 positive 06/06/2016  . Iritis 06/06/2016  . High risk medication use 06/06/2016  . Primary osteoarthritis of both hands 06/06/2016  . Smoker 06/06/2016  . COPD exacerbation ([place]) 04/10/2016  . Anxiety  04/10/2016  . Essential hypertension     Willow Ora, PTA, [facility] [address], [place], [place] [address] [phone] 06/26/18, 9:32 AM   Name: Kirsten Harper MRN: [id number] Date of Birth: 03/22/1946

## 2018-06-27 NOTE — Therapy (Signed)
[facility] [address] [place], [place], [address] Phone: [phone]   Fax:  [phone]  Physical Therapy Treatment  Patient Details  Name: Kirsten Harper MRN: [id number] Date of Birth: [birth year] Referring Provider (PT): Hazel Sams, PA-C   Encounter Date: 06/26/2018  PT End of Session - 06/26/18 1324    Visit Number  3    Number of Visits  17    Date for PT Re-Evaluation  70/01/74   cert written for 90 days, POC for 60 days   Authorization Type  Medicare/[employer]-10th visit progress note needed     PT Start Time  1320  (Pended)     PT Stop Time  1400  (Pended)     PT Time Calculation (min)  40 min  (Pended)     Equipment Utilized During Treatment  Gait belt    Activity Tolerance  Patient tolerated treatment well;No increased pain    Behavior During Therapy  WFL for tasks assessed/performed       Past Medical History:  Diagnosis Date  . Ankylosing spondylitis ([place])   . Anxiety   . Arthritis   . Chronic lower back pain   . CKD (chronic kidney disease), stage II    "related to the Ankylosing spondylitis" (04/10/2016)  . COPD (chronic obstructive pulmonary disease) ([place])   . Eye disease   . Hypertension   . PONV (postoperative nausea and vomiting)    "didn't bother me the last time I had it in ~ 2015"    Past Surgical History:  Procedure Laterality Date  . BROW LIFT AND BLEPHAROPLASTY Bilateral ~ 2015  . CATARACT EXTRACTION W/ INTRAOCULAR LENS  IMPLANT, BILATERAL Bilateral 2000s  . MANDIBLE SURGERY  ~ 2000   "jaw had moved to left; broke my jaw in 5 places; put titanium in; stripped muscle from bone; wired jaw shut"  . TUBAL LIGATION  1979  . VAGINAL HYSTERECTOMY  1985    Vitals:   06/26/18 1322  BP: 110/72    Subjective Assessment - 06/26/18 1322    Subjective  No changes since yesterday. Pain is about the same. No falls since last session. Continues to wear sandles. Does want to have her  BP checked as she reports it was high at [facility], and was put on another med for BP.     Pertinent History  HTN, COPD, CKD, chronic back pain     Limitations  Walking;Standing;House hold activities    How long can you stand comfortably?  about 30 mins (not comfortable)     How long can you walk comfortably?  No more than 30 mins    Patient Stated Goals  "To decrease my pain."     Currently in Pain?  Yes    Pain Score  5     Pain Location  Generalized   wrists, back, knees and ankles   Pain Descriptors / Indicators  Aching;Dull    Pain Type  Chronic pain    Pain Onset  More than a month ago    Pain Frequency  Constant   varies in intensity   Aggravating Factors   anklyosing spoindylisis, certain movements, increasd mobility    Pain Relieving Factors  resting helps some           [facility] Adult PT Treatment/Exercise - 06/26/18 1330      Transfers   Transfers  Sit to Stand;Stand to Sit    Sit to Stand  6: Modified  independent (Device/Increase time);With upper extremity assist;From chair/3-in-1    Stand to Sit  6: Modified independent (Device/Increase time);With upper extremity assist;To chair/3-in-1      Ambulation/Gait   Ambulation/Gait  Yes    Ambulation/Gait Assistance  5: Supervision;4: Min guard    Ambulation/Gait Assistance Details  cues on sequencing, cane placement and posture. no balance loss noted. trialed rollator at pt's request. cues on use and safety with rollator with supervision only needed. Pt wishes to proceed with getting one of these to use.     Ambulation Distance (Feet)  230 Feet   x1 w/cane, 350 x1 w/rollator   Assistive device  Straight cane;Rollator   with rubber quad tip   Gait Pattern  Step-through pattern;Decreased stride length;Decreased trunk rotation;Trunk flexed    Ambulation Surface  Level;Indoor          Balance Exercises - 06/26/18 1349      Balance Exercises: Standing   Standing Eyes Closed  Wide (BOA);Head turns;Foam/compliant  surface;Limitations;Other reps (comment);30 secs      Balance Exercises: Standing   Standing Eyes Closed Limitations  on airex with no UE support in parallel bars with feet hip width apart: EC no head movements, progressing to EC with head movements left<>right, then up<>down. min guard assist for balance with cues on posture and weight shifting to assist with balance recovery.          PT Short Term Goals - 06/26/18 0929      PT SHORT TERM GOAL #1   Title  Pt will initiate HEP in order to indicate improved functional mobility and decreased fall risk.  (Target Date: 07/19/18)    Time  4    Period  Weeks    Status  On-going      PT SHORT TERM GOAL #2   Title  Pt will improve 5TSS to </=17 secs without UE support in order to indicate improved strength and decreased fall risk.      Time  4    Period  Weeks    Status  On-going      PT SHORT TERM GOAL #3   Title  Pt will improve DGI to >/=21/24 in order to indicate decreased fall risk.     Time  4    Period  Weeks    Status  On-going      PT SHORT TERM GOAL #4   Title  Will assess FGA and set appropriate LTG to indicate decreased fall risk.     Baseline  06/25/18: 17/30 scored as baseline. PT to set goals as indicated.     Time  4    Period  Weeks    Status  Achieved      PT SHORT TERM GOAL #5   Title  Pt will ambulate x 500' outdoors over unlevel paved surfaces w/ LRAD at mod I level in order to indicate safe community negotiation.     Status  On-going        PT Long Term Goals - 06/19/18 1049      PT LONG TERM GOAL #1   Title  Pt will be independent with final HEP (both land and pool) in order to indicate improved functional mobility and decreased fall risk.  (Target Date: 08/18/18)    Time  8    Period  Weeks    Status  New    Target Date  08/18/18      PT LONG TERM GOAL #2   Title  Pt  will perform 5TSS in </=14 secs w/o UE support in order to indicate improved BLE strength and decreased fall risk.      Time  8     Period  Weeks    Status  New      PT LONG TERM GOAL #3   Title  Pt will improve gait speed to >/=3.68 ft/sec in order to indicate improved efficiency of gait.      Time  8    Period  Weeks    Status  New      PT LONG TERM GOAL #4   Title  Pt will ambulate 1000' over paved and unlevel grassy surfaces w/ LRAD at mod I level in order to indicate safe leisure and home negotiation.      Time  8    Period  Weeks    Status  New            Plan - 06/26/18 1324    Clinical Impression Statement  Today's skilled session worked on gait with cane and rollator. Pt felt more secure with rollator and wishes to proceed with obtaining one for home. Remainder of session continued to address balance training with no issues reported. The pt is progressing toward goals and should benefit from continued PT to progress toward unmet goals.     Rehab Potential  Good    Clinical Impairments Affecting Rehab Potential  Disease process    PT Frequency  2x / week    PT Duration  8 weeks    PT Treatment/Interventions  ADLs/Self Care Home Management;Aquatic Therapy;DME Instruction;Gait training;Stair training;Functional mobility training;Therapeutic activities;Therapeutic exercise;Balance training;Neuromuscular re-education;Patient/family education;Orthotic Fit/Training;Passive range of motion;Energy conservation;Vestibular    PT Next Visit Plan  get with Vinnie Level about pool schedule, continue with LE strengthening, balance training. check on status of rollator order.    Consulted and Agree with Plan of Care  Patient       Patient will benefit from skilled therapeutic intervention in order to improve the following deficits and impairments:  Abnormal gait, Decreased activity tolerance, Decreased balance, Decreased endurance, Decreased knowledge of use of DME, Decreased mobility, Decreased range of motion, Decreased strength, Difficulty walking, Hypomobility, Impaired perceived functional ability, Impaired  flexibility, Postural dysfunction  Visit Diagnosis: Muscle weakness (generalized)  Unsteadiness on feet  Other abnormalities of gait and mobility  Abnormal posture     Problem List Patient Active Problem List   Diagnosis Date Noted  . Preventative health care 05/04/2018  . Advance directive discussed with patient 05/04/2018  . RLS (restless legs syndrome) 04/25/2017  . Mood disorder ([place]) 04/08/2017  . Sensory loss 04/08/2017  . COPD (chronic obstructive pulmonary disease) ([place]) 04/08/2017  . Ankylosing spondylitis of lumbosacral region ([place]) 09/24/2016  . GAD (generalized anxiety disorder) 08/27/2016  . 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 Spondyloarthropathy HLA-B27 positive 06/06/2016  . Iritis 06/06/2016  . High risk medication use 06/06/2016  . Primary osteoarthritis of both hands 06/06/2016  . Smoker 06/06/2016  . COPD exacerbation ([place]) 04/10/2016  . Anxiety 04/10/2016  . Essential hypertension     Willow Ora, PTA, [facility] [address] [place], [place] [address] [phone] 06/27/18, 6:53 PM   Name: Kirsten Harper MRN: [id number] Date of Birth: 06-Feb-1946

## 2018-07-03 ENCOUNTER — Encounter: Payer: Self-pay | Admitting: Rehabilitation

## 2018-07-03 ENCOUNTER — Ambulatory Visit: Payer: Medicare Other | Admitting: Rehabilitation

## 2018-07-03 ENCOUNTER — Telehealth: Payer: Self-pay | Admitting: Rehabilitation

## 2018-07-03 DIAGNOSIS — R2681 Unsteadiness on feet: Secondary | ICD-10-CM

## 2018-07-03 DIAGNOSIS — M6281 Muscle weakness (generalized): Secondary | ICD-10-CM

## 2018-07-03 DIAGNOSIS — R293 Abnormal posture: Secondary | ICD-10-CM | POA: Diagnosis not present

## 2018-07-03 DIAGNOSIS — R2689 Other abnormalities of gait and mobility: Secondary | ICD-10-CM

## 2018-07-03 NOTE — Therapy (Signed)
[facility] [address] [place], [place], [address] Phone: [phone]   Fax:  [phone]  Physical Therapy Treatment  Patient Details  Name: Kirsten Harper MRN: [id number] Date of Birth: 20-Jul-1945 Referring Provider (PT): Hazel Sams, PA-C   Encounter Date: 07/03/2018  PT End of Session - 07/03/18 1436    Visit Number  4    Number of Visits  17    Date for PT Re-Evaluation  75/64/33   cert written for 90 days, POC for 60 days   Authorization Type  Medicare/[employer]-10th visit progress note needed     PT Start Time  1318    PT Stop Time  1403    PT Time Calculation (min)  45 min    Equipment Utilized During Treatment  Gait belt    Activity Tolerance  Patient tolerated treatment well;No increased pain    Behavior During Therapy  WFL for tasks assessed/performed       Past Medical History:  Diagnosis Date  . Ankylosing spondylitis ([place])   . Anxiety   . Arthritis   . Chronic lower back pain   . CKD (chronic kidney disease), stage II    "related to the Ankylosing spondylitis" (04/10/2016)  . COPD (chronic obstructive pulmonary disease) ([place])   . Eye disease   . Hypertension   . PONV (postoperative nausea and vomiting)    "didn't bother me the last time I had it in ~ 2015"    Past Surgical History:  Procedure Laterality Date  . BROW LIFT AND BLEPHAROPLASTY Bilateral ~ 2015  . CATARACT EXTRACTION W/ INTRAOCULAR LENS  IMPLANT, BILATERAL Bilateral 2000s  . MANDIBLE SURGERY  ~ 2000   "jaw had moved to left; broke my jaw in 5 places; put titanium in; stripped muscle from bone; wired jaw shut"  . TUBAL LIGATION  1979  . VAGINAL HYSTERECTOMY  1985    There were no vitals filed for this visit.  Subjective Assessment - 07/03/18 1327    Subjective  Pt reports that her wrists are hurting more today.  Enjoyed using rollator walker.     Pertinent History  HTN, COPD, CKD, chronic back pain     Limitations   Walking;Standing;House hold activities    How long can you stand comfortably?  about 30 mins (not comfortable)     How long can you walk comfortably?  No more than 30 mins    Patient Stated Goals  "To decrease my pain."     Currently in Pain?  Yes    Pain Score  4     Pain Location  Wrist    Pain Orientation  Right;Left    Pain Descriptors / Indicators  Aching    Pain Type  Chronic pain    Pain Radiating Towards  wrist/hands, ankles    Pain Onset  More than a month ago    Pain Frequency  Constant    Aggravating Factors   AS, certain movements, increased mobility    Pain Relieving Factors  resting helps some                        [facility] Adult PT Treatment/Exercise - 07/03/18 1340      Self-Care   Self-Care  Other Self-Care Comments    Other Self-Care Comments   Continue to educate on proper shoe wear for safety and stability.  Did encourage pt to go for fitting and if they caused  increased pain, then don't get new shoes, however if she did try on new shoes and found them to be comfortable, to write down exactly what shoe it is and even search online for shoe as she may be able to find them for cheaper price than fleet feet.  Pt verbalized understanding.  Also continue to discuss benefits of pool therapy, even if just one visit and pt can be given HEP and then go to [facility] to continue HEP there.  Pt verbalized understanding.       Exercises   Exercises  Other Exercises;Knee/Hip    Other Exercises   Core strengthening on mat:  Hooklying marching x 10 reps progressing to bicycle (keeping LEs off mat) for 10 count x 2 reps.  Hookling lower pelvic rotation x 10 reps, BLE briging x 10 reps, hooklying posterior pelvic tilt x 10 reps with 5 sec hold, BLE bridging w/ BLEs on physioball x 10 reps, lower pelvic rotation w/ LEs on physioball x 10 reps, ended with BLE bridging with knee flex x 5 reps with max difficulty.        Knee/Hip Exercises: Aerobic   Stepper  BUE/LEs  x 7 mins at level 2.0 resistance and maintaining rpms 50-60's.               PT Education - 07/03/18 1436    Education Details  see self care     Person(s) Educated  Patient    Methods  Explanation    Comprehension  Verbalized understanding       PT Short Term Goals - 06/26/18 0929      PT SHORT TERM GOAL #1   Title  Pt will initiate HEP in order to indicate improved functional mobility and decreased fall risk.  (Target Date: 07/19/18)    Time  4    Period  Weeks    Status  On-going      PT SHORT TERM GOAL #2   Title  Pt will improve 5TSS to </=17 secs without UE support in order to indicate improved strength and decreased fall risk.      Time  4    Period  Weeks    Status  On-going      PT SHORT TERM GOAL #3   Title  Pt will improve DGI to >/=21/24 in order to indicate decreased fall risk.     Time  4    Period  Weeks    Status  On-going      PT SHORT TERM GOAL #4   Title  Will assess FGA and set appropriate LTG to indicate decreased fall risk.     Baseline  06/25/18: 17/30 scored as baseline. PT to set goals as indicated.     Time  4    Period  Weeks    Status  Achieved      PT SHORT TERM GOAL #5   Title  Pt will ambulate x 500' outdoors over unlevel paved surfaces w/ LRAD at mod I level in order to indicate safe community negotiation.     Status  On-going        PT Long Term Goals - 06/19/18 1049      PT LONG TERM GOAL #1   Title  Pt will be independent with final HEP (both land and pool) in order to indicate improved functional mobility and decreased fall risk.  (Target Date: 08/18/18)    Time  8    Period  Weeks  Status  New    Target Date  08/18/18      PT LONG TERM GOAL #2   Title  Pt will perform 5TSS in </=14 secs w/o UE support in order to indicate improved BLE strength and decreased fall risk.      Time  8    Period  Weeks    Status  New      PT LONG TERM GOAL #3   Title  Pt will improve gait speed to >/=3.68 ft/sec in order to indicate  improved efficiency of gait.      Time  8    Period  Weeks    Status  New      PT LONG TERM GOAL #4   Title  Pt will ambulate 1000' over paved and unlevel grassy surfaces w/ LRAD at mod I level in order to indicate safe leisure and home negotiation.      Time  8    Period  Weeks    Status  New            Plan - 07/03/18 1437    Clinical Impression Statement  Pt reports she did not initially want to come today due to increased pain in ankles and wrists that she feels is due to increased walking.  Therefore session focused on core and LE strengthening.  Pt tolerated well.  PT did provide pt with order for rollator during session and instruction on how to obtain at [facility].     Rehab Potential  Good    Clinical Impairments Affecting Rehab Potential  Disease process    PT Frequency  2x / week    PT Duration  8 weeks    PT Treatment/Interventions  ADLs/Self Care Home Management;Aquatic Therapy;DME Instruction;Gait training;Stair training;Functional mobility training;Therapeutic activities;Therapeutic exercise;Balance training;Neuromuscular re-education;Patient/family education;Orthotic Fit/Training;Passive range of motion;Energy conservation;Vestibular    PT Next Visit Plan  Did she get rollator? get with [facility] about pool schedule, continue with LE strengthening, balance training and flexibility    Consulted and Agree with Plan of Care  Patient       Patient will benefit from skilled therapeutic intervention in order to improve the following deficits and impairments:  Abnormal gait, Decreased activity tolerance, Decreased balance, Decreased endurance, Decreased knowledge of use of DME, Decreased mobility, Decreased range of motion, Decreased strength, Difficulty walking, Hypomobility, Impaired perceived functional ability, Impaired flexibility, Postural dysfunction  Visit Diagnosis: Muscle weakness (generalized)  Unsteadiness on feet  Other abnormalities of gait and  mobility  Abnormal posture     Problem List Patient Active Problem List   Diagnosis Date Noted  . Preventative health care 05/04/2018  . Advance directive discussed with patient 05/04/2018  . RLS (restless legs syndrome) 04/25/2017  . Mood disorder ([place]) 04/08/2017  . Sensory loss 04/08/2017  . COPD (chronic obstructive pulmonary disease) ([place]) 04/08/2017  . Ankylosing spondylitis of lumbosacral region ([place]) 09/24/2016  . GAD (generalized anxiety disorder) 08/27/2016  . 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 Spondyloarthropathy HLA-B27 positive 06/06/2016  . Iritis 06/06/2016  . High risk medication use 06/06/2016  . Primary osteoarthritis of both hands 06/06/2016  . Smoker 06/06/2016  . COPD exacerbation ([place]) 04/10/2016  . Anxiety 04/10/2016  . Essential hypertension     Cameron Sprang, PT, MPT [facility] [address] [place], [place], [address] Phone: [phone]   Fax:  [phone] 07/03/18, 2:39 PM  Name: Kirsten Harper MRN: [id number] Date of Birth:  02/07/1946   

## 2018-07-03 NOTE — Telephone Encounter (Signed)
I have placed a new referral with the order for a rollator walker. Let me know if I need to do anything additional.  Thank you for reaching out and assisting [facility]!

## 2018-07-03 NOTE — Telephone Encounter (Signed)
Hazel Sams, PA-C,   I am currently treating Ms. Kirsten Harper here at [facility] for PT.  She has trialed use of a rollator which greatly improved her balance and decreased her fall risk.  If you agree would you please write order in Epic work que for rollator walker and I can give this to her on next visit.    Thanks,  Cameron Sprang, PT, MPT [facility] [address] [place], [place], [address] Phone: [phone]   Fax:  [phone] 07/03/18, 8:27 AM

## 2018-07-09 ENCOUNTER — Ambulatory Visit: Payer: Medicare Other | Admitting: Physical Therapy

## 2018-07-09 VITALS — BP 110/64

## 2018-07-09 DIAGNOSIS — R293 Abnormal posture: Secondary | ICD-10-CM | POA: Diagnosis not present

## 2018-07-09 DIAGNOSIS — R2689 Other abnormalities of gait and mobility: Secondary | ICD-10-CM

## 2018-07-09 DIAGNOSIS — R2681 Unsteadiness on feet: Secondary | ICD-10-CM

## 2018-07-09 DIAGNOSIS — M6281 Muscle weakness (generalized): Secondary | ICD-10-CM

## 2018-07-09 NOTE — Therapy (Signed)
[facility] [address] [place] , [place], [address] Phone: [phone]   Fax:  [phone]  Physical Therapy Treatment  Patient Details  Name: Kirsten Harper MRN: [id number] Date of Birth: 1946/06/18 Referring Provider (PT): Hazel Sams, PA-C   Encounter Date: 07/09/2018  PT End of Session - 07/09/18 1921    Visit Number  5    Number of Visits  17    Date for PT Re-Evaluation  09/17/18    Authorization Type  Medicare/[employer]-10th visit progress note needed     PT Start Time  1403    PT Stop Time  1448    PT Time Calculation (min)  45 min       Past Medical History:  Diagnosis Date  . Ankylosing spondylitis ([place])   . Anxiety   . Arthritis   . Chronic lower back pain   . CKD (chronic kidney disease), stage II    "related to the Ankylosing spondylitis" (04/10/2016)  . COPD (chronic obstructive pulmonary disease) ([place])   . Eye disease   . Hypertension   . PONV (postoperative nausea and vomiting)    "didn't bother me the last time I had it in ~ 2015"    Past Surgical History:  Procedure Laterality Date  . BROW LIFT AND BLEPHAROPLASTY Bilateral ~ 2015  . CATARACT EXTRACTION W/ INTRAOCULAR LENS  IMPLANT, BILATERAL Bilateral 2000s  . MANDIBLE SURGERY  ~ 2000   "jaw had moved to left; broke my jaw in 5 places; put titanium in; stripped muscle from bone; wired jaw shut"  . TUBAL LIGATION  1979  . VAGINAL HYSTERECTOMY  1985    Vitals:   07/09/18 1410  BP: 110/64    Subjective Assessment - 07/09/18 1910    Subjective  Pt states she received the rollator but did not bring it today (got it on Dec. 12-24 this week); pt states she really likes it    Pertinent History  HTN, COPD, CKD, chronic back pain     Limitations  Walking;Standing;House hold activities    Patient Stated Goals  "To decrease my pain."     Currently in Pain?  Yes    Pain Score  6                        [facility] Adult PT  Treatment/Exercise - 07/09/18 0001      Transfers   Transfers  Sit to Stand;Stand to Sit    Sit to Stand  5: Supervision    Stand to Sit  5: Supervision    Number of Reps  Other reps (comment)   3   Comments  1 UE support used      Ambulation/Gait   Ambulation/Gait  Yes    Ambulation/Gait Assistance  4: Min guard    Ambulation/Gait Assistance Details  unsteady, slightly antalgic gait pattern due to c/o pain in Rt ankle    Ambulation Distance (Feet)  100 Feet    Assistive device  None   pt did not bring rollator   Gait Pattern  Step-through pattern;Decreased stride length;Decreased trunk rotation;Trunk flexed    Ambulation Surface  Level;Indoor      Knee/Hip Exercises: Aerobic   Recumbent Bike  SciFit level 3.0 x 5" with bil. UE's & LE's      Knee/Hip Exercises: Standing   Hip Flexion  AROM;Right;Left;1 set;10 reps    Hip Abduction  AROM;Right;Left;1 set;10 reps;Knee straight  Hip Extension  AROM;Right;Left;1 set;10 reps    Forward Step Up  Right;Left;1 set;10 reps;Hand Hold: 2;Step Height: 4"   performed inside // bars     NeuroRe-ed:  Standing perpendicular on blue balance beam for facilitation of hip strategy - min to mod hand held assist needed for balance  Alternate tap downs to floor while standing on blue balance beam for improved SLS on compliant surface - 5 reps each foot with min hand held assist Stepping over and back of balance beam - 5 reps each foot with min to mod hand held assist        PT Short Term Goals - 06/26/18 0929      PT SHORT TERM GOAL #1   Title  Pt will initiate HEP in order to indicate improved functional mobility and decreased fall risk.  (Target Date: 07/19/18)    Time  4    Period  Weeks    Status  On-going      PT SHORT TERM GOAL #2   Title  Pt will improve 5TSS to </=17 secs without UE support in order to indicate improved strength and decreased fall risk.      Time  4    Period  Weeks    Status  On-going      PT SHORT TERM GOAL  #3   Title  Pt will improve DGI to >/=21/24 in order to indicate decreased fall risk.     Time  4    Period  Weeks    Status  On-going      PT SHORT TERM GOAL #4   Title  Will assess FGA and set appropriate LTG to indicate decreased fall risk.     Baseline  06/25/18: 17/30 scored as baseline. PT to set goals as indicated.     Time  4    Period  Weeks    Status  Achieved      PT SHORT TERM GOAL #5   Title  Pt will ambulate x 500' outdoors over unlevel paved surfaces w/ LRAD at mod I level in order to indicate safe community negotiation.     Status  On-going        PT Long Term Goals - 06/19/18 1049      PT LONG TERM GOAL #1   Title  Pt will be independent with final HEP (both land and pool) in order to indicate improved functional mobility and decreased fall risk.  (Target Date: 08/18/18)    Time  8    Period  Weeks    Status  New    Target Date  08/18/18      PT LONG TERM GOAL #2   Title  Pt will perform 5TSS in </=14 secs w/o UE support in order to indicate improved BLE strength and decreased fall risk.      Time  8    Period  Weeks    Status  New      PT LONG TERM GOAL #3   Title  Pt will improve gait speed to >/=3.68 ft/sec in order to indicate improved efficiency of gait.      Time  8    Period  Weeks    Status  New      PT LONG TERM GOAL #4   Title  Pt will ambulate 1000' over paved and unlevel grassy surfaces w/ LRAD at mod I level in order to indicate safe leisure and home negotiation.  Time  8    Period  Weeks    Status  New            Plan - 07/09/18 1922    Clinical Impression Statement  Pt states she obtained rollator on Tues., 07-07-18 but did not bring it today (feels she really only needs it when she is going to be standing or walking for prolonged periods of time); pt demonstrates decreased standing balance, especially on compliant surface with need for UE support to maintain balance.  Pt demonstrated good ankle mobility on rockerboard but  needed UE support to maintain balance; pt unable to perform standing plantarflexion due to toes being fused due to ankylosing spondylitis (per pt report).                         Rehab Potential  Good    Clinical Impairments Affecting Rehab Potential  Disease process    PT Frequency  2x / week    PT Duration  8 weeks    PT Treatment/Interventions  ADLs/Self Care Home Management;Aquatic Therapy;DME Instruction;Gait training;Stair training;Functional mobility training;Therapeutic activities;Therapeutic exercise;Balance training;Neuromuscular re-education;Patient/family education;Orthotic Fit/Training;Passive range of motion;Energy conservation;Vestibular    PT Next Visit Plan  continue with LE strengthening, balance training and flexibility (pt states she wants to do only 1 pool session due to distance to [facility] from her home)    Consulted and Agree with Plan of Care  Patient       Patient will benefit from skilled therapeutic intervention in order to improve the following deficits and impairments:  Abnormal gait, Decreased activity tolerance, Decreased balance, Decreased endurance, Decreased knowledge of use of DME, Decreased mobility, Decreased range of motion, Decreased strength, Difficulty walking, Hypomobility, Impaired perceived functional ability, Impaired flexibility, Postural dysfunction  Visit Diagnosis: Muscle weakness (generalized)  Unsteadiness on feet  Other abnormalities of gait and mobility     Problem List Patient Active Problem List   Diagnosis Date Noted  . Preventative health care 05/04/2018  . Advance directive discussed with patient 05/04/2018  . RLS (restless legs syndrome) 04/25/2017  . Mood disorder ([place]) 04/08/2017  . Sensory loss 04/08/2017  . COPD (chronic obstructive pulmonary disease) ([place]) 04/08/2017  . Ankylosing spondylitis of lumbosacral region ([place]) 09/24/2016  . GAD (generalized anxiety disorder) 08/27/2016  . Sedative hypnotic or anxiolytic dependence  ([place]) 08/27/2016  . Spondyloarthropathy HLA-B27 positive 06/06/2016  . Iritis 06/06/2016  . High risk medication use 06/06/2016  . Primary osteoarthritis of both hands 06/06/2016  . Smoker 06/06/2016  . COPD exacerbation ([place]) 04/10/2016  . Anxiety 04/10/2016  . Essential hypertension     Kirsten Harper, [place], PT 07/09/2018, 7:45 PM  [facility] [address] [place], [place], [address] Phone: [phone]   Fax:  [phone]  Name: Kirsten Harper MRN: [id number] Date of Birth: 10-May-1946

## 2018-07-13 ENCOUNTER — Telehealth: Payer: Self-pay | Admitting: Rheumatology

## 2018-07-13 ENCOUNTER — Other Ambulatory Visit: Payer: Self-pay

## 2018-07-13 DIAGNOSIS — Z79899 Other long term (current) drug therapy: Secondary | ICD-10-CM

## 2018-07-13 LAB — CBC WITH DIFFERENTIAL/PLATELET
Absolute Monocytes: 735 {cells}/uL (ref 200–950)
Basophils Absolute: 30 {cells}/uL (ref 0–200)
Basophils Relative: 0.4 %
Eosinophils Absolute: 353 {cells}/uL (ref 15–500)
Eosinophils Relative: 4.7 %
HCT: 39.2 % (ref 35.0–45.0)
Hemoglobin: 13.7 g/dL (ref 11.7–15.5)
Lymphs Abs: 1470 {cells}/uL (ref 850–3900)
MCH: 32.6 pg (ref 27.0–33.0)
MCHC: 34.9 g/dL (ref 32.0–36.0)
MCV: 93.3 fL (ref 80.0–100.0)
MPV: 10.3 fL (ref 7.5–12.5)
Monocytes Relative: 9.8 %
Neutro Abs: 4913 {cells}/uL (ref 1500–7800)
Neutrophils Relative %: 65.5 %
Platelets: 250 Thousand/uL (ref 140–400)
RBC: 4.2 Million/uL (ref 3.80–5.10)
RDW: 11.9 % (ref 11.0–15.0)
Total Lymphocyte: 19.6 %
WBC: 7.5 Thousand/uL (ref 3.8–10.8)

## 2018-07-13 LAB — COMPLETE METABOLIC PANEL WITH GFR
AG Ratio: 1.4 (calc) (ref 1.0–2.5)
ALKALINE PHOSPHATASE (APISO): 88 U/L (ref 33–130)
ALT: 16 U/L (ref 6–29)
AST: 18 U/L (ref 10–35)
Albumin: 4 g/dL (ref 3.6–5.1)
BUN: 14 mg/dL (ref 7–25)
CO2: 27 mmol/L (ref 20–32)
Calcium: 9.8 mg/dL (ref 8.6–10.4)
Chloride: 105 mmol/L (ref 98–110)
Creat: 0.84 mg/dL (ref 0.60–0.93)
GFR, Est African American: 82 mL/min/{1.73_m2} (ref >=60)
GFR, Est Non African American: 70 mL/min/{1.73_m2} (ref >=60)
Globulin: 2.8 g/dL (calc) (ref 1.9–3.7)
Glucose, Bld: 122 mg/dL — ABNORMAL HIGH (ref 65–99)
Potassium: 4.2 mmol/L (ref 3.5–5.3)
Sodium: 139 mmol/L (ref 135–146)
Total Bilirubin: 0.6 mg/dL (ref 0.2–1.2)
Total Protein: 6.8 g/dL (ref 6.1–8.1)

## 2018-07-13 NOTE — Telephone Encounter (Signed)
Patient advised we do not prescribe pain medication and she will need to discuss with PCP. Patient verbalized understanding.

## 2018-07-13 NOTE — Telephone Encounter (Signed)
Patient called requesting pain medicine for her knee pain to be sent to [facility] on [address] [place].

## 2018-07-13 NOTE — Telephone Encounter (Signed)
Attempted to contact the patient and left message for patient to call the office.  

## 2018-07-16 ENCOUNTER — Encounter: Payer: Self-pay | Admitting: Rehabilitation

## 2018-07-16 ENCOUNTER — Ambulatory Visit: Payer: Medicare Other | Attending: Internal Medicine | Admitting: Rehabilitation

## 2018-07-16 DIAGNOSIS — M6281 Muscle weakness (generalized): Secondary | ICD-10-CM

## 2018-07-16 DIAGNOSIS — R2689 Other abnormalities of gait and mobility: Secondary | ICD-10-CM | POA: Diagnosis not present

## 2018-07-16 DIAGNOSIS — R2681 Unsteadiness on feet: Secondary | ICD-10-CM

## 2018-07-16 DIAGNOSIS — R293 Abnormal posture: Secondary | ICD-10-CM | POA: Diagnosis not present

## 2018-07-16 NOTE — Therapy (Signed)
[facility] [address] [place], [place], [address] Phone: [phone]   Fax:  [phone]  Physical Therapy Treatment  Patient Details  Name: Kirsten Harper MRN: [id number] Date of Birth: [birth year] Referring Provider (PT): Hazel Sams, PA-C   Encounter Date: 07/16/2018  PT End of Session - 07/16/18 1455    Visit Number  6    Number of Visits  17    Date for PT Re-Evaluation  09/17/18    Authorization Type  Medicare/[employer]-10th visit progress note needed     PT Start Time  1316    PT Stop Time  1400    PT Time Calculation (min)  44 min       Past Medical History:  Diagnosis Date  . Ankylosing spondylitis ([place])   . Anxiety   . Arthritis   . Chronic lower back pain   . CKD (chronic kidney disease), stage II    "related to the Ankylosing spondylitis" (04/10/2016)  . COPD (chronic obstructive pulmonary disease) ([place])   . Eye disease   . Hypertension   . PONV (postoperative nausea and vomiting)    "didn't bother me the last time I had it in ~ 2015"    Past Surgical History:  Procedure Laterality Date  . BROW LIFT AND BLEPHAROPLASTY Bilateral ~ 2015  . CATARACT EXTRACTION W/ INTRAOCULAR LENS  IMPLANT, BILATERAL Bilateral 2000s  . MANDIBLE SURGERY  ~ 2000   "jaw had moved to left; broke my jaw in 5 places; put titanium in; stripped muscle from bone; wired jaw shut"  . TUBAL LIGATION  1979  . VAGINAL HYSTERECTOMY  1985    There were no vitals filed for this visit.  Subjective Assessment - 07/16/18 1320    Subjective  Pt reports increased hand and eye pain today.  Knees and ankles seem okay today.     Pertinent History  HTN, COPD, CKD, chronic back pain     Limitations  Walking;Standing;House hold activities    How long can you stand comfortably?  about 30 mins (not comfortable)     How long can you walk comfortably?  No more than 30 mins    Patient Stated Goals  "To decrease my pain."     Currently  in Pain?  Yes    Pain Score  4     Pain Location  Hand    Pain Orientation  Right;Left    Pain Descriptors / Indicators  Aching    Pain Type  Chronic pain    Pain Onset  More than a month ago    Pain Frequency  Constant    Aggravating Factors   AS    Pain Relieving Factors  resting helps some                       [facility] Adult PT Treatment/Exercise - 07/16/18 1324      Self-Care   Self-Care  Other Self-Care Comments    Other Self-Care Comments   Pt reporting that her hands were very stiff and painful today, however she "bent" L hand and then laid on it under head and reports that it hurt initially but felt much better afterwards.  Provided education how heat or parafin unit while performing AROM in hands could assist in decreasing stiffness.  Pt verbalized understanding.  Also educated on using laundry bag instead of basket as she has to [employer] up/down full flight of stairs to get to  laundry area.  Pt agreed to idea.       Neuro Re-ed    Neuro Re-ed Details   High level balance on compliant surface in // bars;  Standing on foam beam perpendicularly maintaining balance x 2 sets of 20 secs (feet apart), feet apart alternating forward steps to floor and back to beam x 10 reps, alternating retro steps to ground and back to beam x 10 reps both with intermittent UE support as needed.  Note good ankle and pt getting better at initating hip strategy during session.  Progressed to side stepping perpendicularly placed on foam beam x 4 reps with light UE support and cues for upright posture, tandem stance on foam beam x 2 sets of 15 secs each direction with light UE support.  Pt notably weaker in R stance, therefore continued to address with marching on foam airex x 10 reps, tapping to cones x 10 reps each with cues for slower movement, R SLS on foam x 10 secs (2 sets on each side).  Pt questioned about balance exercises from HEP.  Pt does not currently have corner balance on compliance  surface.  Reviewed current HEP for corner and added pillow (compliant) surface to all exercises and performed in session, see pt instruction for details.           Access Code: [email or web address]  URL: [email or web address]  Date: 07/16/2018  Prepared by: Cameron Sprang   Exercises  Tandem Walking with Counter Support - 3 reps - 1 sets - 1x daily - 5x weekly  Wide Stance with Eyes Closed and Head Nods on Foam Pad - 10 reps - 1 sets - 1x daily - 5x weekly  Wide Stance with Eyes Closed and Head Rotation on Foam Pad - 10 reps - 1 sets - 1x daily - 5x weekly  Romberg Stance on Foam Pad - 3 reps - 1 sets - 20 hold - 1x daily - 5x weekly      PT Education - 07/16/18 1455    Education Details  see self care     Person(s) Educated  Patient    Methods  Explanation    Comprehension  Verbalized understanding       PT Short Term Goals - 06/26/18 0929      PT SHORT TERM GOAL #1   Title  Pt will initiate HEP in order to indicate improved functional mobility and decreased fall risk.  (Target Date: 07/19/18)    Time  4    Period  Weeks    Status  On-going      PT SHORT TERM GOAL #2   Title  Pt will improve 5TSS to </=17 secs without UE support in order to indicate improved strength and decreased fall risk.      Time  4    Period  Weeks    Status  On-going      PT SHORT TERM GOAL #3   Title  Pt will improve DGI to >/=21/24 in order to indicate decreased fall risk.     Time  4    Period  Weeks    Status  On-going      PT SHORT TERM GOAL #4   Title  Will assess FGA and set appropriate LTG to indicate decreased fall risk.     Baseline  06/25/18: 17/30 scored as baseline. PT to set goals as indicated.     Time  4    Period  Weeks    Status  Achieved  PT SHORT TERM GOAL #5   Title  Pt will ambulate x 500' outdoors over unlevel paved surfaces w/ LRAD at mod I level in order to indicate safe community negotiation.     Status  On-going        PT Long Term Goals - 06/19/18  1049      PT LONG TERM GOAL #1   Title  Pt will be independent with final HEP (both land and pool) in order to indicate improved functional mobility and decreased fall risk.  (Target Date: 08/18/18)    Time  8    Period  Weeks    Status  New    Target Date  08/18/18      PT LONG TERM GOAL #2   Title  Pt will perform 5TSS in </=14 secs w/o UE support in order to indicate improved BLE strength and decreased fall risk.      Time  8    Period  Weeks    Status  New      PT LONG TERM GOAL #3   Title  Pt will improve gait speed to >/=3.68 ft/sec in order to indicate improved efficiency of gait.      Time  8    Period  Weeks    Status  New      PT LONG TERM GOAL #4   Title  Pt will ambulate 1000' over paved and unlevel grassy surfaces w/ LRAD at mod I level in order to indicate safe leisure and home negotiation.      Time  8    Period  Weeks    Status  New            Plan - 07/16/18 1456    Clinical Impression Statement  Skilled session focused on high level balance on compliant surfaces to elicit ankle, hip and stepping strategy.  Updated corner balance HEP to include compliant surfaces as she did very well without compliant surfaces at home.     Rehab Potential  Good    Clinical Impairments Affecting Rehab Potential  Disease process    PT Frequency  2x / week    PT Duration  8 weeks    PT Treatment/Interventions  ADLs/Self Care Home Management;Aquatic Therapy;DME Instruction;Gait training;Stair training;Functional mobility training;Therapeutic activities;Therapeutic exercise;Balance training;Neuromuscular re-education;Patient/family education;Orthotic Fit/Training;Passive range of motion;Energy conservation;Vestibular    PT Next Visit Plan  STGs 1/5, compliant surfaces, ankle/hip/step strategy,continue with LE strengthening, balance training and flexibility (pt states she wants to do only 1 pool session due to distance to [facility] from her home)    Consulted and Agree with Plan of Care   Patient       Patient will benefit from skilled therapeutic intervention in order to improve the following deficits and impairments:  Abnormal gait, Decreased activity tolerance, Decreased balance, Decreased endurance, Decreased knowledge of use of DME, Decreased mobility, Decreased range of motion, Decreased strength, Difficulty walking, Hypomobility, Impaired perceived functional ability, Impaired flexibility, Postural dysfunction  Visit Diagnosis: Muscle weakness (generalized)  Unsteadiness on feet  Other abnormalities of gait and mobility  Abnormal posture     Problem List Patient Active Problem List   Diagnosis Date Noted  . Preventative health care 05/04/2018  . Advance directive discussed with patient 05/04/2018  . RLS (restless legs syndrome) 04/25/2017  . Mood disorder ([place]) 04/08/2017  . Sensory loss 04/08/2017  . COPD (chronic obstructive pulmonary disease) ([place]) 04/08/2017  . Ankylosing spondylitis of lumbosacral region ([place]) 09/24/2016  . GAD (  generalized anxiety disorder) 08/27/2016  . 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 Spondyloarthropathy HLA-B27 positive 06/06/2016  . Iritis 06/06/2016  . High risk medication use 06/06/2016  . Primary osteoarthritis of both hands 06/06/2016  . Smoker 06/06/2016  . COPD exacerbation ([place]) 04/10/2016  . Anxiety 04/10/2016  . Essential hypertension     Cameron Sprang, PT, MPT [facility] [address] [place], [place], [address] Phone: [phone]   Fax:  [phone] 07/16/18, 2:58 PM  Name: JACYLN CARMER MRN: [id number] Date of Birth: 19-May-1946

## 2018-07-16 NOTE — Patient Instructions (Signed)
Access Code: [email or web address]  URL: [email or web address]  Date: 07/16/2018  Prepared by: Cameron Sprang   Exercises  Tandem Walking with Counter Support - 3 reps - 1 sets - 1x daily - 5x weekly  Wide Stance with Eyes Closed and Head Nods on Foam Pad - 10 reps - 1 sets - 1x daily - 5x weekly  Wide Stance with Eyes Closed and Head Rotation on Foam Pad - 10 reps - 1 sets - 1x daily - 5x weekly  Romberg Stance on Foam Pad - 3 reps - 1 sets - 20 hold - 1x daily - 5x weekly

## 2018-07-17 ENCOUNTER — Ambulatory Visit: Payer: Medicare Other | Admitting: Rehabilitation

## 2018-07-17 NOTE — Progress Notes (Deleted)
Office Visit Note  Patient: Kirsten Harper             Date of Birth: 07/21/1945           MRN: [id number]             PCP: Venia Carbon, MD Referring: Venia Carbon, MD Visit Date: 07/28/2018 Occupation: '@GUAROCC'$ @  Subjective:  No chief complaint on file.   History of Present Illness: Kirsten Harper is a 73 y.o. female ***   Activities of Daily Living:  Patient reports morning stiffness for *** {minute/hour:19697}.   Patient {ACTIONS;DENIES/REPORTS:21021675::"Denies"} nocturnal pain.  Difficulty dressing/grooming: {ACTIONS;DENIES/REPORTS:21021675::"Denies"} Difficulty climbing stairs: {ACTIONS;DENIES/REPORTS:21021675::"Denies"} Difficulty getting out of chair: {ACTIONS;DENIES/REPORTS:21021675::"Denies"} Difficulty using hands for taps, buttons, cutlery, and/or writing: {ACTIONS;DENIES/REPORTS:21021675::"Denies"}  No Rheumatology ROS completed.   PMFS History:  Patient Active Problem List   Diagnosis Date Noted  . Preventative health care 05/04/2018  . Advance directive discussed with patient 05/04/2018  . RLS (restless legs syndrome) 04/25/2017  . Mood disorder ([place]) 04/08/2017  . Sensory loss 04/08/2017  . COPD (chronic obstructive pulmonary disease) ([place]) 04/08/2017  . Ankylosing spondylitis of lumbosacral region ([place]) 09/24/2016  . GAD (generalized anxiety disorder) 08/27/2016  . 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 Spondyloarthropathy HLA-B27 positive 06/06/2016  . Iritis 06/06/2016  . High risk medication use 06/06/2016  . Primary osteoarthritis of both hands 06/06/2016  . Smoker 06/06/2016  . COPD exacerbation ([place]) 04/10/2016  . Anxiety 04/10/2016  . Essential hypertension     Past Medical History:  Diagnosis Date  . Ankylosing spondylitis ([place])   . Anxiety   . Arthritis   . Chronic lower back pain   . CKD (chronic kidney disease), stage II    "related to the Ankylosing spondylitis" (04/10/2016)  . COPD (chronic  obstructive pulmonary disease) ([place])   . Eye disease   . Hypertension   . PONV (postoperative nausea and vomiting)    "didn't bother me the last time I had it in ~ 2015"    Family History  Problem Relation Age of Onset  . Hypertension Mother   . Heart disease Sister   . Diabetes Sister   . Pulmonary fibrosis Brother   . Idiopathic pulmonary fibrosis Brother   . Alzheimer's disease Brother   . Dementia Brother    Past Surgical History:  Procedure Laterality Date  . BROW LIFT AND BLEPHAROPLASTY Bilateral ~ 2015  . CATARACT EXTRACTION W/ INTRAOCULAR LENS  IMPLANT, BILATERAL Bilateral 2000s  . MANDIBLE SURGERY  ~ 2000   "jaw had moved to left; broke my jaw in 5 places; put titanium in; stripped muscle from bone; wired jaw shut"  . TUBAL LIGATION  1979  . VAGINAL HYSTERECTOMY  1985   Social History   Social History Narrative   2 sons--not in area   Lives on property with her mother [name]      Has living will   Would want one of her sons, [name], to be Media planner   Requests DNR   No tube feeds    Objective: Vital Signs: There were no vitals taken for this visit.   Physical Exam   Musculoskeletal Exam: ***  CDAI Exam: CDAI Score: Not documented Patient Global Assessment: Not documented; Provider Global Assessment: Not documented Swollen: Not documented; Tender: Not documented Joint Exam   Not documented   There is currently no information documented on the homunculus. Go to the Rheumatology activity and complete the homunculus joint exam.  Investigation: No additional findings.  Imaging: No results found.  Recent Labs: Lab Results  Component Value Date   WBC 7.5 07/13/2018   HGB 13.7 07/13/2018   PLT 250 07/13/2018   NA 139 07/13/2018   K 4.2 07/13/2018   CL 105 07/13/2018   CO2 27 07/13/2018   GLUCOSE 122 (H) 07/13/2018   BUN 14 07/13/2018   CREATININE 0.84 07/13/2018   BILITOT 0.6 07/13/2018   ALKPHOS 74 03/03/2017   AST 18 07/13/2018    ALT 16 07/13/2018   PROT 6.8 07/13/2018   ALBUMIN 4.5 03/03/2017   CALCIUM 9.8 07/13/2018   GFRAA 82 07/13/2018   QFTBGOLDPLUS NEGATIVE 12/23/2017    Speciality Comments: No specialty comments available.  Procedures:  No procedures performed Allergies: Amoxapine and related   Assessment / Plan:     Visit Diagnoses: Ankylosing spondylitis of lumbosacral region ([facility])  High risk medication use - Humira 40 mg sq injections every 14 days  Uveitis  HLA B 27 Positive   Olecranon bursitis of left elbow  Primary osteoarthritis of both hands  Primary osteoarthritis of both knees  History of hypertension  History of anxiety  History of COPD  Former smoker   Orders: No orders of the defined types were placed in this encounter.  No orders of the defined types were placed in this encounter.   Face-to-face time spent with patient was *** minutes. Greater than 50% of time was spent in counseling and coordination of care.  Follow-Up Instructions: No follow-ups on file.   Ofilia Neas, PA-C  Note - This record has been created using Dragon software.  Chart creation errors have been sought, but may not always  have been located. Such creation errors do not reflect on  the standard of medical care.

## 2018-07-20 ENCOUNTER — Encounter: Payer: Self-pay | Admitting: Rheumatology

## 2018-07-20 ENCOUNTER — Ambulatory Visit (INDEPENDENT_AMBULATORY_CARE_PROVIDER_SITE_OTHER): Payer: Medicare Other | Admitting: Rheumatology

## 2018-07-20 VITALS — BP 126/71 | HR 103 | Resp 15 | Ht 67.0 in | Wt 197.2 lb

## 2018-07-20 DIAGNOSIS — Z8679 Personal history of other diseases of the circulatory system: Secondary | ICD-10-CM | POA: Diagnosis not present

## 2018-07-20 DIAGNOSIS — M19042 Primary osteoarthritis, left hand: Secondary | ICD-10-CM | POA: Diagnosis not present

## 2018-07-20 DIAGNOSIS — G8929 Other chronic pain: Secondary | ICD-10-CM | POA: Diagnosis not present

## 2018-07-20 DIAGNOSIS — M47819 Spondylosis without myelopathy or radiculopathy, site unspecified: Secondary | ICD-10-CM

## 2018-07-20 DIAGNOSIS — H209 Unspecified iridocyclitis: Secondary | ICD-10-CM | POA: Diagnosis not present

## 2018-07-20 DIAGNOSIS — M25562 Pain in left knee: Secondary | ICD-10-CM | POA: Diagnosis not present

## 2018-07-20 DIAGNOSIS — Z87891 Personal history of nicotine dependence: Secondary | ICD-10-CM

## 2018-07-20 DIAGNOSIS — Z8709 Personal history of other diseases of the respiratory system: Secondary | ICD-10-CM

## 2018-07-20 DIAGNOSIS — M457 Ankylosing spondylitis of lumbosacral region: Secondary | ICD-10-CM

## 2018-07-20 DIAGNOSIS — M19041 Primary osteoarthritis, right hand: Secondary | ICD-10-CM | POA: Diagnosis not present

## 2018-07-20 DIAGNOSIS — Z79899 Other long term (current) drug therapy: Secondary | ICD-10-CM | POA: Diagnosis not present

## 2018-07-20 DIAGNOSIS — Z8659 Personal history of other mental and behavioral disorders: Secondary | ICD-10-CM

## 2018-07-20 DIAGNOSIS — M25561 Pain in right knee: Secondary | ICD-10-CM

## 2018-07-20 DIAGNOSIS — M170 Bilateral primary osteoarthritis of knee: Secondary | ICD-10-CM | POA: Diagnosis not present

## 2018-07-20 MED ORDER — TRIAMCINOLONE ACETONIDE 40 MG/ML IJ SUSP
40.0000 mg | INTRAMUSCULAR | Status: AC | PRN
  Administered 2018-07-20: 40 mg via INTRA_ARTICULAR

## 2018-07-20 MED ORDER — SULINDAC 150 MG PO TABS: 150.0000 mg | ORAL_TABLET | Freq: Two times a day (BID) | ORAL | 2 refills | Status: DC | PRN

## 2018-07-20 MED ORDER — LIDOCAINE HCL 1 % IJ SOLN
1.5000 mL | INTRAMUSCULAR | Status: AC | PRN
  Administered 2018-07-20: 1.5 mL

## 2018-07-20 NOTE — Patient Instructions (Signed)
Standing Labs We placed an order today for your standing lab work.    Please come back and get your standing labs in March and every 3 months  We have open lab Monday through Friday from 8:30-11:30 AM and 1:30-4:00 PM  at the office of Dr. Shaira Sova.   You may experience shorter wait times on Monday and Friday afternoons. The office is located at [address], [address], [place], [place] [address] No appointment is necessary.   Labs are drawn by [facility].  You may receive a bill from [facility] for your lab work.  If you wish to have your labs drawn at another location, please call the office 24 hours in advance to send orders.  If you have any questions regarding directions or hours of operation,  please call [phone].   Just as a reminder please drink plenty of water prior to coming for your lab work. Thanks!  

## 2018-07-20 NOTE — Progress Notes (Addendum)
Office Visit Note  Patient: Kirsten Harper             Date of Birth: [birth year]           MRN: [id number]             PCP: Venia Carbon, MD Referring: Venia Carbon, MD Visit Date: 07/20/2018 Occupation: _0 @  Subjective:  Bilateral knee joint pain   History of Present Illness: Kirsten Harper is a 73 y.o. female with history of ankylosing spondylitis and osteoarthritis.  She was off of Humira for 1 month but restarted on Friday.   She fell at the beginning of November 2019 outside of [employer] and landed on her knee joints.  She continues to have pain in both knee joints.  At her last visit her blood pressure was elevated, so we were unable to perform cortisone injections.  She was recently started on 2 new blood pressure medications which is controlling her BP.  She would like bilateral knee joint cortisone injections today. She reports she has been having increased pain in multiple joints including the right ankle, left wrist joints, bilateral shoulder joints, and both knee joints.  She states she has noticed swelling in the left wrist joint.  She has been having significant difficulty getting up from a chair due to the pain both knee joints.  She reports that the left elbow olecranon bursitis has resolved since the cortisone injection.  She denies any recent uveitis flares.  She states if she stands for longer than 30 minutes she experiences a dull ache in her lower back.     Activities of Daily Living:  Patient reports morning stiffness  all day.   Patient Reports nocturnal pain.  Difficulty dressing/grooming: Denies Difficulty climbing stairs: Reports Difficulty getting out of chair: Reports Difficulty using hands for taps, buttons, cutlery, and/or writing: Reports  Review of Systems  Constitutional: Positive for fatigue.  HENT: Positive for mouth dryness. Negative for mouth sores and nose dryness.   Eyes: Positive for dryness. Negative for pain and visual disturbance.   Respiratory: Negative for cough, hemoptysis, shortness of breath and difficulty breathing.   Cardiovascular: Negative for chest pain, palpitations, hypertension and swelling in legs/feet.  Gastrointestinal: Positive for constipation. Negative for blood in stool and diarrhea.  Endocrine: Negative for increased urination.  Genitourinary: Negative for painful urination.  Musculoskeletal: Positive for arthralgias, joint pain, joint swelling and morning stiffness. Negative for myalgias, muscle weakness, muscle tenderness and myalgias.  Skin: Negative for color change, pallor, rash, hair loss, nodules/bumps, skin tightness, ulcers and sensitivity to sunlight.  Allergic/Immunologic: Negative for susceptible to infections.  Neurological: Negative for dizziness, numbness, headaches and weakness.  Hematological: Negative for swollen glands.  Psychiatric/Behavioral: Negative for depressed mood and sleep disturbance. The patient is nervous/anxious.     PMFS History:  Patient Active Problem List   Diagnosis Date Noted  . Preventative health care 05/04/2018  . Advance directive discussed with patient 05/04/2018  . RLS (restless legs syndrome) 04/25/2017  . Mood disorder ([place]) 04/08/2017  . Sensory loss 04/08/2017  . COPD (chronic obstructive pulmonary disease) ([place]) 04/08/2017  . Ankylosing spondylitis of lumbosacral region ([place]) 09/24/2016  . GAD (generalized anxiety disorder) 08/27/2016  . 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 Spondyloarthropathy HLA-B27 positive 06/06/2016  . Iritis 06/06/2016  . High risk medication use 06/06/2016  . Primary osteoarthritis of both hands 06/06/2016  . Smoker 06/06/2016  . COPD exacerbation ([place]) 04/10/2016  .  Anxiety 04/10/2016  . Essential hypertension     Past Medical History:  Diagnosis Date  . Ankylosing spondylitis ([place])   . Anxiety   . Arthritis   . Chronic lower back pain   . CKD (chronic kidney disease), stage II     "related to the Ankylosing spondylitis" (04/10/2016)  . COPD (chronic obstructive pulmonary disease) ([place])   . Eye disease   . Hypertension   . PONV (postoperative nausea and vomiting)    "didn't bother me the last time I had it in ~ 2015"    Family History  Problem Relation Age of Onset  . Hypertension Mother   . Heart disease Sister   . Diabetes Sister   . Pulmonary fibrosis Brother   . Idiopathic pulmonary fibrosis Brother   . Alzheimer's disease Brother   . Dementia Brother    Past Surgical History:  Procedure Laterality Date  . BROW LIFT AND BLEPHAROPLASTY Bilateral ~ 2015  . CATARACT EXTRACTION W/ INTRAOCULAR LENS  IMPLANT, BILATERAL Bilateral 2000s  . MANDIBLE SURGERY  ~ 2000   "jaw had moved to left; broke my jaw in 5 places; put titanium in; stripped muscle from bone; wired jaw shut"  . TUBAL LIGATION  1979  . VAGINAL HYSTERECTOMY  1985   Social History   Social History Narrative   2 sons--not in area   Lives on property with her mother [name]      Has living will   Would want one of her sons, [name], to be Media planner   Requests DNR   No tube feeds    Objective: Vital Signs: BP 126/71 (BP Location: Left Arm, Patient Position: Sitting, Cuff Size: Normal)   Pulse (!) 103   Resp 15   Ht _0  (1.702 m)   Wt 197 lb 3.2 oz (89.4 kg)   BMI 30.89 kg/m    Physical Exam Vitals signs and nursing note reviewed.  Constitutional:      Appearance: She is well-developed.  HENT:     Head: Normocephalic and atraumatic.  Eyes:     Conjunctiva/sclera: Conjunctivae normal.  Neck:     Musculoskeletal: Normal range of motion.  Cardiovascular:     Rate and Rhythm: Normal rate and regular rhythm.     Heart sounds: Normal heart sounds.  Pulmonary:     Effort: Pulmonary effort is normal.     Breath sounds: Normal breath sounds.  Abdominal:     General: Bowel sounds are normal.     Palpations: Abdomen is soft.  Lymphadenopathy:     Cervical: No cervical  adenopathy.  Skin:    General: Skin is warm and dry.     Capillary Refill: Capillary refill takes less than 2 seconds.  Neurological:     Mental Status: She is alert and oriented to person, place, and time.  Psychiatric:        Behavior: Behavior normal.      Musculoskeletal Exam: C-spine good ROM.  Thoracic and lumbar spine limited ROM with discomfort.  No midline spinal tenderness.  No SI joint tenderness.  Shoulder joints, elbow joints, wrist joints, MCPs, PIPs, and DIPs good ROM.  PIP and DIP synovial thickening.  Tenderness of the left wrist joint on exam.  Hip joints good ROM with no discomfort.  No tenderness over trochanteric bursa bilaterally. Limited extension of both knee joints.  Left knee warmth on exam.  Tenderness of the right ankle joint on exam.    CDAI Exam: CDAI Score:  8.4  Patient Global Assessment: 7 (mm); Provider Global Assessment: 7 (mm) Swollen: 4 ; Tender: 6  Joint Exam      Right  Left  Wrist      Tender  PIP 5  Swollen Tender     DIP 5  Swollen Tender     Knee  Swollen Tender  Swollen Tender  Ankle   Tender        Investigation: No additional findings.  Imaging: No results found.  Recent Labs: Lab Results  Component Value Date   WBC 7.5 07/13/2018   HGB 13.7 07/13/2018   PLT 250 07/13/2018   NA 139 07/13/2018   K 4.2 07/13/2018   CL 105 07/13/2018   CO2 27 07/13/2018   GLUCOSE 122 (H) 07/13/2018   BUN 14 07/13/2018   CREATININE 0.84 07/13/2018   BILITOT 0.6 07/13/2018   ALKPHOS 74 03/03/2017   AST 18 07/13/2018   ALT 16 07/13/2018   PROT 6.8 07/13/2018   ALBUMIN 4.5 03/03/2017   CALCIUM 9.8 07/13/2018   GFRAA 82 07/13/2018   QFTBGOLDPLUS NEGATIVE 12/23/2017    Speciality Comments: No specialty comments available.  Procedures:  Large Joint Inj: bilateral knee on 07/20/2018 11:47 AM Indications: pain Details: 27 G 1.5 in needle, medial approach  Arthrogram: No  Medications (Right): 1.5 mL lidocaine 1 %; 40 mg triamcinolone  acetonide 40 MG/ML Aspirate (Right): 0 mL Medications (Left): 1.5 mL lidocaine 1 %; 40 mg triamcinolone acetonide 40 MG/ML Aspirate (Left): 0 mL Outcome: tolerated well, no immediate complications Procedure, treatment alternatives, risks and benefits explained, specific risks discussed. Consent was given by the patient. Immediately prior to procedure a time out was called to verify the correct patient, procedure, equipment, support staff and site/side marked as required. Patient was prepped and draped in the usual sterile fashion.     Allergies: Amoxapine and related   Assessment / Plan:     Visit Diagnoses:    Pain in both knees-patient has been experiencing pain and swelling in her bilateral knee joints since her last fall.  She is also having a flare as she came off Humira a month ago.  After informed consent was obtained per patient's request bilateral knee joints were injected with cortisone as described above.  She tolerated the procedure well.  Ankylosing spondylitis-she has severe disease and is having a flare as she has been off Humira for approximately a month now.  She just took last dose.  She states Sulindac usually helps.  Per her request a prescription refill for sulindac was given.  Side effects were reviewed.  High risk medication use-patient is on Humira.  Her labs have been stable.  She will get labs every 3 months to monitor for drug toxicity.  Uveitis-patient denies any recent flares of uveitis.  Primary osteoarthritis of both hands-she has some underlying osteoarthritis which causes discomfort.  Primary osteoarthritis of both knees-patient has a chronic osteoarthritis in her knee joints and had Visco supplement injections in the past.  Other medical problems are listed as follows: History of COPD  History of anxiety  History of hypertension  Former smoker Orders: Orders Placed This Encounter  Procedures  . Large Joint Inj: bilateral knee   Meds ordered  this encounter  Medications  . sulindac (CLINORIL) 150 MG tablet    Sig: Take 1 tablet (150 mg total) by mouth 2 (two) times daily as needed.    Dispense:  60 tablet    Refill:  2  Face-to-face time spent with patient was 30 minutes. Greater than 50% of time was spent in counseling and coordination of care.  Follow-Up Instructions: Return in about 5 months (around 12/19/2018) for Ankylosing Spondylitis, Osteoarthritis.   Bo Merino, MD  Note - This record has been created using Editor, commissioning.  Chart creation errors have been sought, but may not always  have been located. Such creation errors do not reflect on  the standard of medical care.

## 2018-07-21 ENCOUNTER — Ambulatory Visit: Payer: Medicare Other | Admitting: Physical Therapy

## 2018-07-23 ENCOUNTER — Encounter: Payer: Self-pay | Admitting: Physical Therapy

## 2018-07-23 ENCOUNTER — Ambulatory Visit: Payer: Medicare Other | Admitting: Physical Therapy

## 2018-07-23 DIAGNOSIS — M6281 Muscle weakness (generalized): Secondary | ICD-10-CM | POA: Diagnosis not present

## 2018-07-23 DIAGNOSIS — R2681 Unsteadiness on feet: Secondary | ICD-10-CM | POA: Diagnosis not present

## 2018-07-23 DIAGNOSIS — R2689 Other abnormalities of gait and mobility: Secondary | ICD-10-CM

## 2018-07-23 DIAGNOSIS — R293 Abnormal posture: Secondary | ICD-10-CM | POA: Diagnosis not present

## 2018-07-25 NOTE — Therapy (Signed)
[facility] [address] [place], [place], [address] Phone: [phone]   Fax:  [phone]  Physical Therapy Treatment  Patient Details  Name: Kirsten Harper MRN: [id number] Date of Birth: August 25, 1945 Referring Provider (PT): Hazel Sams, PA-C   Encounter Date: 07/23/2018   07/23/18 1325  PT Visits / Re-Eval  Visit Number 7  Number of Visits 17  Date for PT Re-Evaluation 09/17/18  Authorization  Authorization Type Medicare/[employer]-10th visit progress note needed   PT Time Calculation  PT Start Time 7353  PT Stop Time 1400  PT Time Calculation (min) 43 min  PT - End of Session  Equipment Utilized During Treatment Gait belt  Activity Tolerance Patient tolerated treatment well  Behavior During Therapy [facility] for tasks assessed/performed     Past Medical History:  Diagnosis Date  . Ankylosing spondylitis ([place])   . Anxiety   . Arthritis   . Chronic lower back pain   . CKD (chronic kidney disease), stage II    "related to the Ankylosing spondylitis" (04/10/2016)  . COPD (chronic obstructive pulmonary disease) ([place])   . Eye disease   . Hypertension   . PONV (postoperative nausea and vomiting)    "didn't bother me the last time I had it in ~ 2015"    Past Surgical History:  Procedure Laterality Date  . BROW LIFT AND BLEPHAROPLASTY Bilateral ~ 2015  . CATARACT EXTRACTION W/ INTRAOCULAR LENS  IMPLANT, BILATERAL Bilateral 2000s  . MANDIBLE SURGERY  ~ 2000   "jaw had moved to left; broke my jaw in 5 places; put titanium in; stripped muscle from bone; wired jaw shut"  . TUBAL LIGATION  1979  . VAGINAL HYSTERECTOMY  1985    There were no vitals filed for this visit.     07/23/18 1322  Symptoms/Limitations  Subjective Has missed last 2 days due to severe pain, called her MD about this and has since had both knees injected. Pain in the knees is much better. Continues with pain in both hands. No falls.    Pertinent History HTN, COPD, CKD, chronic back pain   Limitations Walking;Standing;House hold activities  How long can you stand comfortably? about 30 mins (not comfortable)   How long can you walk comfortably? No more than 30 mins  Patient Stated Goals "To decrease my pain."   Pain Assessment  Currently in Pain? Yes  Pain Score 4  Pain Location Generalized (multiple joints over body)  Pain Descriptors / Indicators Aching;Sore;Throbbing  Pain Type Chronic pain  Pain Onset More than a month ago  Aggravating Factors  AS  Pain Relieving Factors resting helps some, cortisone injections      07/23/18 1326  Dynamic Gait Index  Level Surface 3  Change in Gait Speed 3  Gait with Horizontal Head Turns 3  Gait with Vertical Head Turns 3  Gait and Pivot Turn 3  Step Over Obstacle 2  Step Around Obstacles 3  Steps 2  Total Score 22  DGI comment: Scores of 19 or less indicative of high fall risk      07/23/18 1326  Transfers  Transfers Sit to Stand;Stand to Sit  Sit to Stand 5: Supervision;With upper extremity assist;From chair/3-in-1  Five time sit to stand comments  15.56 with light UE support using standard height chair   Stand to Sit 5: Supervision;With upper extremity assist;To chair/3-in-1  Ambulation/Gait  Ambulation/Gait Yes  Ambulation/Gait Assistance 5: Supervision  Ambulation/Gait Assistance Details occasional cues to  stay closer to rollator   Ambulation Distance (Feet) 500 Feet  Assistive device Rollator  Gait Pattern Step-through pattern;Decreased stride length;Decreased trunk rotation;Trunk flexed  Ambulation Surface Level;Unlevel;Indoor;Outdoor;Paved       07/23/18 1358  Balance Exercises: Standing  Standing Eyes Closed Wide (BOA);Foam/compliant surface;2 reps;30 secs;Limitations  Balance Beam standing across blue foam beam with feet hip width apart: alternating fwd stepping to floor/back onto beam, then alternating backward stepping to floor/back onto beam, 10  reps each with min assist, cues on technique/weight shifting, with intermittent touch to bars for balance.    Balance Exercises: Standing  Standing Eyes Closed Limitations standing across blue foam beam in wide stance: EC no head movements with min guard to min assist for balance        PT Short Term Goals - 07/23/18 1325      PT SHORT TERM GOAL #1   Title  Pt will initiate HEP in order to indicate improved functional mobility and decreased fall risk.  (Target Date: 07/19/18)    Time  4    Period  Weeks    Status  On-going      PT SHORT TERM GOAL #2   Title  Pt will improve 5TSS to </=17 secs without UE support in order to indicate improved strength and decreased fall risk.      Time  4    Period  Weeks    Status  On-going      PT SHORT TERM GOAL #3   Title  Pt will improve DGI to >/=21/24 in order to indicate decreased fall risk.     Time  4    Period  Weeks    Status  On-going      PT SHORT TERM GOAL #4   Title  Will assess FGA and set appropriate LTG to indicate decreased fall risk.     Baseline  06/25/18: 17/30 scored as baseline. PT to set goals as indicated.     Status  Achieved      PT SHORT TERM GOAL #5   Title  Pt will ambulate x 500' outdoors over unlevel paved surfaces w/ LRAD at mod I level in order to indicate safe community negotiation.     Status  On-going        PT Long Term Goals - 06/19/18 1049      PT LONG TERM GOAL #1   Title  Pt will be independent with final HEP (both land and pool) in order to indicate improved functional mobility and decreased fall risk.  (Target Date: 08/18/18)    Time  8    Period  Weeks    Status  New    Target Date  08/18/18      PT LONG TERM GOAL #2   Title  Pt will perform 5TSS in </=14 secs w/o UE support in order to indicate improved BLE strength and decreased fall risk.      Time  8    Period  Weeks    Status  New      PT LONG TERM GOAL #3   Title  Pt will improve gait speed to >/=3.68 ft/sec in order to indicate  improved efficiency of gait.      Time  8    Period  Weeks    Status  New      PT LONG TERM GOAL #4   Title  Pt will ambulate 1000' over paved and unlevel grassy surfaces w/ LRAD at mod I  level in order to indicate safe leisure and home negotiation.      Time  8    Period  Weeks    Status  New          07/23/18 1325  Plan  Clinical Impression Statement Today's skilled session focused on progress toward STGs with pt meeting all STGs set. Remainder of session continued to address strengthening and balance without any issues reported. The pt is progressing toward goals and should benefit from continued PT to progress toward unmet goals.   Pt will benefit from skilled therapeutic intervention in order to improve on the following deficits Abnormal gait;Decreased activity tolerance;Decreased balance;Decreased endurance;Decreased knowledge of use of DME;Decreased mobility;Decreased range of motion;Decreased strength;Difficulty walking;Hypomobility;Impaired perceived functional ability;Impaired flexibility;Postural dysfunction  Rehab Potential Good  Clinical Impairments Affecting Rehab Potential Disease process  PT Frequency 2x / week  PT Duration 8 weeks  PT Treatment/Interventions ADLs/Self Care Home Management;Aquatic Therapy;DME Instruction;Gait training;Stair training;Functional mobility training;Therapeutic activities;Therapeutic exercise;Balance training;Neuromuscular re-education;Patient/family education;Orthotic Fit/Training;Passive range of motion;Energy conservation;Vestibular  PT Next Visit Plan compliant surfaces, ankle/hip/step strategy,continue with LE strengthening, balance training and flexibility (pt states she wants to do only 1 pool session due to distance to [facility] from her home)  Consulted and Agree with Plan of Care Patient         Patient will benefit from skilled therapeutic intervention in order to improve the following deficits and impairments:  Abnormal gait,  Decreased activity tolerance, Decreased balance, Decreased endurance, Decreased knowledge of use of DME, Decreased mobility, Decreased range of motion, Decreased strength, Difficulty walking, Hypomobility, Impaired perceived functional ability, Impaired flexibility, Postural dysfunction  Visit Diagnosis: Muscle weakness (generalized)  Unsteadiness on feet  Other abnormalities of gait and mobility     Problem List Patient Active Problem List   Diagnosis Date Noted  . Preventative health care 05/04/2018  . Advance directive discussed with patient 05/04/2018  . RLS (restless legs syndrome) 04/25/2017  . Mood disorder ([place]) 04/08/2017  . Sensory loss 04/08/2017  . COPD (chronic obstructive pulmonary disease) ([place]) 04/08/2017  . Ankylosing spondylitis of lumbosacral region ([place]) 09/24/2016  . GAD (generalized anxiety disorder) 08/27/2016  . 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 Spondyloarthropathy HLA-B27 positive 06/06/2016  . Iritis 06/06/2016  . High risk medication use 06/06/2016  . Primary osteoarthritis of both hands 06/06/2016  . Smoker 06/06/2016  . COPD exacerbation ([place]) 04/10/2016  . Anxiety 04/10/2016  . Essential hypertension     Willow Ora, PTA, [facility] [address] [place], [place] [address] [phone] 07/25/18, 6:02 PM   Name: Kirsten Harper MRN: [id number] Date of Birth: May 01, 1946

## 2018-07-27 ENCOUNTER — Observation Stay (HOSPITAL_COMMUNITY)
Admission: EM | Admit: 2018-07-27 | Discharge: 2018-07-29 | Disposition: A | Discharge status: Home / Self Care | Payer: Medicare Other | Attending: Internal Medicine | Admitting: Internal Medicine

## 2018-07-27 ENCOUNTER — Encounter (HOSPITAL_COMMUNITY): Payer: Self-pay

## 2018-07-27 ENCOUNTER — Ambulatory Visit: Payer: Self-pay | Admitting: *Deleted

## 2018-07-27 DIAGNOSIS — I10 Essential (primary) hypertension: Secondary | ICD-10-CM | POA: Diagnosis present

## 2018-07-27 DIAGNOSIS — J449 Chronic obstructive pulmonary disease, unspecified: Secondary | ICD-10-CM | POA: Diagnosis present

## 2018-07-27 DIAGNOSIS — K5730 Diverticulosis of large intestine without perforation or abscess without bleeding: Secondary | ICD-10-CM | POA: Diagnosis not present

## 2018-07-27 DIAGNOSIS — K625 Hemorrhage of anus and rectum: Secondary | ICD-10-CM | POA: Insufficient documentation

## 2018-07-27 DIAGNOSIS — Z7982 Long term (current) use of aspirin: Secondary | ICD-10-CM | POA: Insufficient documentation

## 2018-07-27 DIAGNOSIS — J441 Chronic obstructive pulmonary disease with (acute) exacerbation: Secondary | ICD-10-CM | POA: Diagnosis not present

## 2018-07-27 DIAGNOSIS — R109 Unspecified abdominal pain: Secondary | ICD-10-CM | POA: Diagnosis present

## 2018-07-27 DIAGNOSIS — I129 Hypertensive chronic kidney disease with stage 1 through stage 4 chronic kidney disease, or unspecified chronic kidney disease: Secondary | ICD-10-CM | POA: Diagnosis not present

## 2018-07-27 DIAGNOSIS — N183 Chronic kidney disease, stage 3 (moderate): Secondary | ICD-10-CM | POA: Insufficient documentation

## 2018-07-27 DIAGNOSIS — G8929 Other chronic pain: Secondary | ICD-10-CM | POA: Diagnosis not present

## 2018-07-27 DIAGNOSIS — F419 Anxiety disorder, unspecified: Secondary | ICD-10-CM | POA: Diagnosis present

## 2018-07-27 DIAGNOSIS — K529 Noninfective gastroenteritis and colitis, unspecified: Secondary | ICD-10-CM | POA: Diagnosis not present

## 2018-07-27 DIAGNOSIS — M545 Low back pain: Secondary | ICD-10-CM | POA: Diagnosis not present

## 2018-07-27 DIAGNOSIS — M459 Ankylosing spondylitis of unspecified sites in spine: Secondary | ICD-10-CM | POA: Diagnosis not present

## 2018-07-27 DIAGNOSIS — Z66 Do not resuscitate: Secondary | ICD-10-CM | POA: Insufficient documentation

## 2018-07-27 DIAGNOSIS — Z79899 Other long term (current) drug therapy: Secondary | ICD-10-CM | POA: Insufficient documentation

## 2018-07-27 DIAGNOSIS — R1030 Lower abdominal pain, unspecified: Secondary | ICD-10-CM | POA: Diagnosis not present

## 2018-07-27 DIAGNOSIS — R197 Diarrhea, unspecified: Secondary | ICD-10-CM | POA: Diagnosis present

## 2018-07-27 DIAGNOSIS — G2581 Restless legs syndrome: Secondary | ICD-10-CM | POA: Diagnosis present

## 2018-07-27 DIAGNOSIS — M47819 Spondylosis without myelopathy or radiculopathy, site unspecified: Secondary | ICD-10-CM | POA: Diagnosis present

## 2018-07-27 DIAGNOSIS — R110 Nausea: Secondary | ICD-10-CM | POA: Diagnosis present

## 2018-07-27 LAB — COMPREHENSIVE METABOLIC PANEL
ALT: 16 U/L (ref 0–44)
AST: 18 U/L (ref 15–41)
Albumin: 4 g/dL (ref 3.5–5.0)
Alkaline Phosphatase: 98 U/L (ref 38–126)
Anion gap: 11 (ref 5–15)
BILIRUBIN TOTAL: 1.2 mg/dL (ref 0.3–1.2)
BUN: 10 mg/dL (ref 8–23)
CALCIUM: 9.5 mg/dL (ref 8.9–10.3)
CO2: 23 mmol/L (ref 22–32)
Chloride: 98 mmol/L (ref 98–111)
Creatinine, Ser: 0.72 mg/dL (ref 0.44–1.00)
GFR calc Af Amer: >60 mL/min (ref >60)
GFR calc non Af Amer: >60 mL/min (ref >60)
Glucose, Bld: 115 mg/dL — ABNORMAL HIGH (ref 70–99)
Potassium: 3.8 mmol/L (ref 3.5–5.1)
Sodium: 132 mmol/L — ABNORMAL LOW (ref 135–145)
Total Protein: 7.6 g/dL (ref 6.5–8.1)

## 2018-07-27 LAB — CBC
HCT: 43.1 % (ref 36.0–46.0)
Hemoglobin: 15.1 g/dL — ABNORMAL HIGH (ref 12.0–15.0)
MCH: 31.9 pg (ref 26.0–34.0)
MCHC: 35 g/dL (ref 30.0–36.0)
MCV: 90.9 fL (ref 80.0–100.0)
PLATELETS: 254 10*3/uL (ref 150–400)
RBC: 4.74 MIL/uL (ref 3.87–5.11)
RDW: 13.1 % (ref 11.5–15.5)
WBC: 14.3 10*3/uL — ABNORMAL HIGH (ref 4.0–10.5)
nRBC: 0 % (ref 0.0–0.2)

## 2018-07-27 LAB — TYPE AND SCREEN
ABO/RH(D): B NEG
Antibody Screen: NEGATIVE

## 2018-07-27 MED ORDER — SODIUM CHLORIDE 0.9 % IV BOLUS
1000.0000 mL | Freq: Once | INTRAVENOUS | Status: AC
  Administered 2018-07-27: 1000 mL via INTRAVENOUS

## 2018-07-27 NOTE — ED Provider Notes (Signed)
[facility] EMERGENCY DEPARTMENT Provider Note   CSN: [id number] Arrival date & time: 07/27/18  1801     History   Chief Complaint Chief Complaint  Patient presents with  . Rectal Bleeding    HPI Kirsten Harper is a 73 y.o. female.  Patient presents to the emergency department for evaluation of abdominal pain and rectal bleeding.  Patient reports that she has been experiencing cramping in her lower abdomen since early this morning.  She reports that she frequently has similar cramps and often times it resolves after she has a diarrhea stool.  Today, however, she went to the bathroom and noticed that she passed dark red blood.  She has never had this happen before.  No fever, weakness, heart palpitations, shortness of breath, dizziness or passing out.     Past Medical History:  Diagnosis Date  . Ankylosing spondylitis ([place])   . Anxiety   . Arthritis   . Chronic lower back pain   . CKD (chronic kidney disease), stage II    "related to the Ankylosing spondylitis" (04/10/2016)  . COPD (chronic obstructive pulmonary disease) ([place])   . Eye disease   . Hypertension   . PONV (postoperative nausea and vomiting)    "didn't bother me the last time I had it in ~ 2015"    Patient Active Problem List   Diagnosis Date Noted  . Preventative health care 05/04/2018  . Advance directive discussed with patient 05/04/2018  . RLS (restless legs syndrome) 04/25/2017  . Mood disorder ([place]) 04/08/2017  . Sensory loss 04/08/2017  . COPD (chronic obstructive pulmonary disease) ([place]) 04/08/2017  . Ankylosing spondylitis of lumbosacral region ([place]) 09/24/2016  . GAD (generalized anxiety disorder) 08/27/2016  . 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 Spondyloarthropathy HLA-B27 positive 06/06/2016  . Iritis 06/06/2016  . High risk medication use 06/06/2016  . Primary osteoarthritis of both hands 06/06/2016  . Smoker 06/06/2016  . COPD exacerbation ([place])  04/10/2016  . Anxiety 04/10/2016  . Essential hypertension     Past Surgical History:  Procedure Laterality Date  . BROW LIFT AND BLEPHAROPLASTY Bilateral ~ 2015  . CATARACT EXTRACTION W/ INTRAOCULAR LENS  IMPLANT, BILATERAL Bilateral 2000s  . MANDIBLE SURGERY  ~ 2000   "jaw had moved to left; broke my jaw in 5 places; put titanium in; stripped muscle from bone; wired jaw shut"  . TUBAL LIGATION  1979  . VAGINAL HYSTERECTOMY  1985     OB History   No obstetric history on file.      Home Medications    Prior to Admission medications   Medication Sig Start Date End Date Taking? Authorizing Provider  Adalimumab (HUMIRA PEN) 40 MG/0.4ML PNKT Inject 40 mg into the skin every 14 (fourteen) days. 05/29/18  Yes Deveshwar, Abel Presto, MD  albuterol (VENTOLIN HFA) 108 (90 Base) MCG/ACT inhaler INHALE 2 PUFFS INTO THE LUNGS EVERY 4 HOURS AS NEEDED FOR WHEEZING/ FOR SHORTNESS OF BREATH Patient taking differently: Inhale 2 puffs into the lungs every 4 (four) hours as needed for wheezing or shortness of breath.  06/24/18  Yes Venia Carbon, MD  amitriptyline (ELAVIL) 100 MG tablet Take 100 mg by mouth at bedtime.   Yes [provider]  amLODipine (NORVASC) 2.5 MG tablet Take 5 mg by mouth every evening.   Yes [provider]  aspirin EC 81 MG tablet Take 81 mg by mouth 2 (two) times daily as needed (for pain).   Yes [provider]  clonazePAM (KLONOPIN) 0.5 MG tablet Take 0.5 mg by mouth 3 (three) times daily as needed for anxiety.    Yes [provider]  diclofenac sodium (VOLTAREN) 1 % GEL Apply 3 grams to 3 large joints up to 3 times daily 06/09/18  Yes Deveshwar, Abel Presto, MD  dorzolamide-timolol (COSOPT) 22.3-6.8 MG/ML ophthalmic solution Place 1 drop into both eyes 2 (two) times daily.  12/17/16  Yes [provider]  losartan (COZAAR) 100 MG tablet Take 100 mg by mouth daily.  09/11/16  Yes [provider]  LOTEMAX 0.5 % ophthalmic  suspension Place 1 drop into both eyes 2 (two) times daily.  12/17/16  Yes [provider]  rOPINIRole (REQUIP) 1 MG tablet TAKE 3 TABLETS (3 MG TOTAL) BY MOUTH AT BEDTIME. 05/11/18  Yes Venia Carbon, MD  sulindac (CLINORIL) 150 MG tablet Take 1 tablet (150 mg total) by mouth 2 (two) times daily as needed. Patient taking differently: Take 150 mg by mouth 2 (two) times daily as needed (for Ankylosing spondylitis-related PAIN).  07/20/18  Yes Deveshwar, Abel Presto, MD  amLODipine (NORVASC) 5 MG tablet Take 1 tablet (5 mg total) by mouth daily. Patient not taking: Reported on 07/27/2018 06/16/18   Venia Carbon, MD    Family History Family History  Problem Relation Age of Onset  . Hypertension Mother   . Heart disease Sister   . Diabetes Sister   . Pulmonary fibrosis Brother   . Idiopathic pulmonary fibrosis Brother   . Alzheimer's disease Brother   . Dementia Brother     Social History Social History   Tobacco Use  . Smoking status: Former Smoker    Packs/day: 0.50    Years: 51.00    Pack years: 25.50    Types: Cigarettes  . Smokeless tobacco: Never Used  Substance Use Topics  . Alcohol use: Yes    Alcohol/week: 0.0 standard drinks    Comment: occ  . Drug use: No     Allergies   Amoxapine and related; Seroquel [quetiapine]; and Other   Review of Systems Review of Systems  Gastrointestinal: Positive for abdominal pain and blood in stool.  All other systems reviewed and are negative.    Physical Exam Updated Vital Signs BP 138/82   Pulse 93   Temp 99.4 F (37.4 C) (Oral)   Resp 16   SpO2 99%   Physical Exam Vitals signs and nursing note reviewed.  Constitutional:      General: She is not in acute distress.    Appearance: Normal appearance. She is well-developed.  HENT:     Head: Normocephalic and atraumatic.     Right Ear: Hearing normal.     Left Ear: Hearing normal.     Nose: Nose normal.  Eyes:     Conjunctiva/sclera: Conjunctivae normal.      Pupils: Pupils are equal, round, and reactive to light.  Neck:     Musculoskeletal: Normal range of motion and neck supple.  Cardiovascular:     Rate and Rhythm: Regular rhythm.     Heart sounds: S1 normal and S2 normal. No murmur. No friction rub. No gallop.   Pulmonary:     Effort: Pulmonary effort is normal. No respiratory distress.     Breath sounds: Normal breath sounds.  Chest:     Chest wall: No tenderness.  Abdominal:     General: Bowel sounds are normal.     Palpations: Abdomen is soft.     Tenderness: There  is no abdominal tenderness. There is no guarding or rebound. Negative signs include Murphy's sign and McBurney's sign.     Hernia: No hernia is present.     Comments: Diffuse lower abd tenderness  Musculoskeletal: Normal range of motion.  Skin:    General: Skin is warm and dry.     Findings: No rash.  Neurological:     Mental Status: She is alert and oriented to person, place, and time.     GCS: GCS eye subscore is 4. GCS verbal subscore is 5. GCS motor subscore is 6.     Cranial Nerves: No cranial nerve deficit.     Sensory: No sensory deficit.     Coordination: Coordination normal.  Psychiatric:        Speech: Speech normal.        Behavior: Behavior normal.        Thought Content: Thought content normal.      ED Treatments / Results  Labs (all labs ordered are listed, but only abnormal results are displayed) Labs Reviewed  COMPREHENSIVE METABOLIC PANEL - Abnormal; Notable for the following components:      Result Value   Sodium 132 (*)    Glucose, Bld 115 (*)    All other components within normal limits  CBC - Abnormal; Notable for the following components:   WBC 14.3 (*)    Hemoglobin 15.1 (*)    All other components within normal limits  CBC - Abnormal; Notable for the following components:   WBC 15.1 (*)    All other components within normal limits  POC OCCULT BLOOD, ED  TYPE AND SCREEN  ABO/RH    EKG None  Radiology Ct Abdomen Pelvis W  Contrast  Result Date: 07/28/2018 CLINICAL DATA:  Rectal bleeding with abdominal cramping and generalized fatigue. History of chronic low back pain with ankylosing spondylitis. EXAM: CT ABDOMEN AND PELVIS WITH CONTRAST TECHNIQUE: Multidetector CT imaging of the abdomen and pelvis was performed using the standard protocol following bolus administration of intravenous contrast. CONTRAST:  159m OMNIPAQUE IOHEXOL 300 MG/ML  SOLN COMPARISON:  None. FINDINGS: Lower chest: Lung bases are clear. Hepatobiliary: No focal liver abnormality is seen. No gallstones, gallbladder wall thickening, or biliary dilatation. Pancreas: Unremarkable. No pancreatic ductal dilatation or surrounding inflammatory changes. Spleen: Normal in size without focal abnormality. Adrenals/Urinary Tract: Adrenal glands are unremarkable. Kidneys are normal, without renal calculi, focal lesion, or hydronephrosis. Bladder is unremarkable. Stomach/Bowel: Stomach, small bowel, and colon are not abnormally distended. Diffuse colonic wall thickening involving the distal transverse colon, descending colon, and sigmoid region. Mild pericolonic fat stranding. Changes likely to represent infectious or inflammatory colitis. Diverticulosis of the sigmoid colon without evidence of diverticulitis. Appendix is normal. Vascular/Lymphatic: No significant vascular findings are present. No enlarged abdominal or pelvic lymph nodes. Reproductive: Status post hysterectomy. No adnexal masses. Other: No free air or free fluid in the abdomen. Abdominal wall musculature appears intact. Musculoskeletal: Degenerative changes in the spine. IMPRESSION: Diffuse colonic wall thickening involving the distal transverse colon, descending colon, and sigmoid colon. Changes likely to represent infectious or inflammatory colitis. Electronically Signed   By: WLucienne CapersM.D.   On: 07/28/2018 00:46    Procedures Procedures (including critical care time)  Medications Ordered in  ED Medications  sodium chloride 0.9 % bolus 1,000 mL (0 mLs Intravenous Stopped 07/28/18 0107)  iohexol (OMNIPAQUE) 300 MG/ML solution 100 mL (100 mLs Intravenous Contrast Given 07/28/18 0011)  ciprofloxacin (CIPRO) IVPB 400 mg (0 mg Intravenous Stopped  07/28/18 0251)  metroNIDAZOLE (FLAGYL) tablet 500 mg (500 mg Oral Given 07/28/18 0149)     Initial Impression / Assessment and Plan / ED Course  I have reviewed the triage vital signs and the nursing notes.  Pertinent labs & imaging results that were available during my care of the patient were reviewed by me and considered in my medical decision making (see chart for details).     Presents to the emergency department for evaluation of abdominal pain.  She has had cramping in the past dark red blood earlier tonight.  Vital signs are unremarkable.  She did have leukocytosis, [place] blood cell count 14.3.  Remainder of labs are unremarkable.  Hemoglobin is 15.1 at arrival.  After liter fluids, repeat hemoglobin was 14.6, no significant drop.  CT scan does show distal colonic wall thickening including transverse and distal colon consistent with colitis.  Will treat for possible infectious etiology, patient also does have a history of HLA-B27.  She has never, however, previously had colitis.  She is not having any significant pain, is stable including no sign of significant anemia.  She has had a bowel movement here in the ER and has not had any further bleeding.  Administered Cipro and Flagyl here in the ER.  Cleared for possible infectious etiology.  As patient is on Humira, infection could potentially be significant.  I discussed the case with Dr. Oletta Lamas, on-call for [facility] gastroenterology, her service.  He felt that the patient would require hospitalization for further management.  Will admit to the hospitalist.  Final Clinical Impressions(s) / ED Diagnoses   Final diagnoses:  Colitis    ED Discharge Orders    None       Etienne Mowers,  Gwenyth Allegra, MD 07/28/18 [phone]

## 2018-07-27 NOTE — Telephone Encounter (Signed)
Pt reports mild rectal bleeding, noted last night and throughout day. States had been constipated "few days". Reports H/O constipation alternating with loose stools.    Reports "bad" abdominal cramping onset 0300, currently present "But less." States at 0300 this AM attempted to disimpact herself digitally, removed "Some hard balls." States has been wearing pad due to mild bleeding since that time and continued during day. Reports 15 mins prior to triage call episode of  "Large amount blood, without stool, pouring out.""  States bright red, turns commode water red.  Denies any dizziness, weakness. States "Maybe a small clot on pad, along with mucous." Pt is not on any anticoagulants.   Pt directed to ED, states will follow disposition, will have someone drive her. Reason for Disposition . [1] MODERATE rectal bleeding (small blood clots, passing blood without stool, or toilet water turns red) AND [2] more than once a day  Answer Assessment - Initial Assessment Questions 1. APPEARANCE of BLOOD: "What color is it?" "Is it passed separately, on the surface of the stool, or mixed in with the stool?"      Bright red 2. AMOUNT: "How much blood was passed?"      "large amount, gushing." No stool. Commode water red 3. FREQUENCY: "How many times has blood been passed with the stools?"      Unsure, "Not looking" 4. ONSET: "When was the blood first seen in the stools?" (Days or weeks)      0300 5. DIARRHEA: "Is there also some diarrhea?" If so, ask: "How many diarrhea stools were passed in past 24 hours?"      no 6. CONSTIPATION: "Do you have constipation?" If so, "How bad is it?"     Yes, "Few days" 7. RECURRENT SYMPTOMS: "Have you had blood in your stools before?" If so, ask: "When was the last time?" and "What happened that time?"      Yes, years ago. 8. BLOOD THINNERS: "Do you take any blood thinners?" (e.g., Coumadin/warfarin, Pradaxa/dabigatran, aspirin)     no 9. OTHER SYMPTOMS: "Do you have any  other symptoms?"  (e.g., abdominal pain, vomiting, dizziness, fever)     Abdominal cramping 0300,still present, not as bad.  Protocols used: RECTAL BLEEDING-A-AH

## 2018-07-27 NOTE — ED Triage Notes (Signed)
Pt reports heavy rectal bleeding since 3am along with abd cramping. States "It looked like red mud". Generalized fatigue.

## 2018-07-28 ENCOUNTER — Ambulatory Visit: Payer: Self-pay | Admitting: Rehabilitation

## 2018-07-28 ENCOUNTER — Encounter (HOSPITAL_COMMUNITY): Payer: Self-pay | Admitting: Internal Medicine

## 2018-07-28 ENCOUNTER — Ambulatory Visit: Payer: Self-pay | Admitting: Rheumatology

## 2018-07-28 ENCOUNTER — Emergency Department (HOSPITAL_COMMUNITY): Payer: Medicare Other

## 2018-07-28 DIAGNOSIS — K529 Noninfective gastroenteritis and colitis, unspecified: Secondary | ICD-10-CM | POA: Diagnosis not present

## 2018-07-28 DIAGNOSIS — K5730 Diverticulosis of large intestine without perforation or abscess without bleeding: Secondary | ICD-10-CM | POA: Diagnosis not present

## 2018-07-28 DIAGNOSIS — K625 Hemorrhage of anus and rectum: Secondary | ICD-10-CM | POA: Diagnosis not present

## 2018-07-28 LAB — CBC
HCT: 42.2 % (ref 36.0–46.0)
Hemoglobin: 14.6 g/dL (ref 12.0–15.0)
MCH: 32 pg (ref 26.0–34.0)
MCHC: 34.6 g/dL (ref 30.0–36.0)
MCV: 92.5 fL (ref 80.0–100.0)
Platelets: 233 10*3/uL (ref 150–400)
RBC: 4.56 MIL/uL (ref 3.87–5.11)
RDW: 13.4 % (ref 11.5–15.5)
WBC: 15.1 10*3/uL — ABNORMAL HIGH (ref 4.0–10.5)
nRBC: 0 % (ref 0.0–0.2)

## 2018-07-28 LAB — ABO/RH: ABO/RH(D): B NEG

## 2018-07-28 MED ORDER — SODIUM CHLORIDE 0.9 % IV SOLN
INTRAVENOUS | Status: DC
  Administered 2018-07-28 – 2018-07-29 (×2): via INTRAVENOUS

## 2018-07-28 MED ORDER — LOTEPREDNOL ETABONATE 0.5 % OP SUSP
1.0000 [drp] | Freq: Two times a day (BID) | OPHTHALMIC | Status: DC
  Administered 2018-07-28: 1 [drp] via OPHTHALMIC
  Filled 2018-07-28: qty 5

## 2018-07-28 MED ORDER — SODIUM CHLORIDE 0.9 % IV SOLN
INTRAVENOUS | Status: DC
  Administered 2018-07-28: 06:00:00 via INTRAVENOUS

## 2018-07-28 MED ORDER — CIPROFLOXACIN IN D5W 400 MG/200ML IV SOLN
400.0000 mg | Freq: Once | INTRAVENOUS | Status: AC
  Administered 2018-07-28: 400 mg via INTRAVENOUS
  Filled 2018-07-28: qty 200

## 2018-07-28 MED ORDER — DORZOLAMIDE HCL-TIMOLOL MAL 2-0.5 % OP SOLN
1.0000 [drp] | Freq: Two times a day (BID) | OPHTHALMIC | Status: DC
  Administered 2018-07-28: 1 [drp] via OPHTHALMIC
  Filled 2018-07-28: qty 10

## 2018-07-28 MED ORDER — METRONIDAZOLE 500 MG PO TABS
500.0000 mg | ORAL_TABLET | Freq: Three times a day (TID) | ORAL | Status: DC
  Administered 2018-07-28 – 2018-07-29 (×4): 500 mg via ORAL
  Filled 2018-07-28 (×4): qty 1

## 2018-07-28 MED ORDER — ROPINIROLE HCL 1 MG PO TABS
3.0000 mg | ORAL_TABLET | Freq: Every day | ORAL | Status: DC
  Administered 2018-07-28: 3 mg via ORAL
  Filled 2018-07-28: qty 3

## 2018-07-28 MED ORDER — CLONAZEPAM 0.5 MG PO TABS: 0.5000 mg | ORAL_TABLET | Freq: Three times a day (TID) | ORAL | Status: DC | PRN

## 2018-07-28 MED ORDER — AMITRIPTYLINE HCL 25 MG PO TABS
100.0000 mg | ORAL_TABLET | Freq: Every day | ORAL | Status: DC
  Administered 2018-07-28: 100 mg via ORAL
  Filled 2018-07-28: qty 4

## 2018-07-28 MED ORDER — AMLODIPINE BESYLATE 5 MG PO TABS
5.0000 mg | ORAL_TABLET | Freq: Every evening | ORAL | Status: DC
  Administered 2018-07-28: 5 mg via ORAL
  Filled 2018-07-28: qty 1

## 2018-07-28 MED ORDER — CIPROFLOXACIN IN D5W 400 MG/200ML IV SOLN
400.0000 mg | Freq: Two times a day (BID) | INTRAVENOUS | Status: DC
  Administered 2018-07-28 – 2018-07-29 (×2): 400 mg via INTRAVENOUS
  Filled 2018-07-28 (×2): qty 200

## 2018-07-28 MED ORDER — ONDANSETRON HCL 4 MG/2ML IJ SOLN: 4.0000 mg | Freq: Four times a day (QID) | INTRAMUSCULAR | Status: DC | PRN

## 2018-07-28 MED ORDER — ALBUTEROL SULFATE (2.5 MG/3ML) 0.083% IN NEBU: 2.5000 mg | INHALATION_SOLUTION | RESPIRATORY_TRACT | Status: DC | PRN

## 2018-07-28 MED ORDER — METRONIDAZOLE 500 MG PO TABS
500.0000 mg | ORAL_TABLET | Freq: Once | ORAL | Status: AC
  Administered 2018-07-28: 500 mg via ORAL
  Filled 2018-07-28: qty 1

## 2018-07-28 MED ORDER — ACETAMINOPHEN 650 MG RE SUPP: 650.0000 mg | Freq: Four times a day (QID) | RECTAL | Status: DC | PRN

## 2018-07-28 MED ORDER — ONDANSETRON HCL 4 MG PO TABS: 4.0000 mg | ORAL_TABLET | Freq: Four times a day (QID) | ORAL | Status: DC | PRN

## 2018-07-28 MED ORDER — LOSARTAN POTASSIUM 50 MG PO TABS
100.0000 mg | ORAL_TABLET | Freq: Every day | ORAL | Status: DC
  Administered 2018-07-28: 100 mg via ORAL
  Filled 2018-07-28 (×2): qty 2

## 2018-07-28 MED ORDER — ACETAMINOPHEN 325 MG PO TABS: 650.0000 mg | ORAL_TABLET | Freq: Four times a day (QID) | ORAL | Status: DC | PRN

## 2018-07-28 MED ORDER — SULINDAC 150 MG PO TABS
150.0000 mg | ORAL_TABLET | Freq: Two times a day (BID) | ORAL | Status: DC | PRN
  Filled 2018-07-28: qty 1

## 2018-07-28 MED ORDER — MORPHINE SULFATE (PF) 2 MG/ML IV SOLN: 2.0000 mg | INTRAVENOUS | Status: DC | PRN

## 2018-07-28 MED ORDER — IOHEXOL 300 MG/ML  SOLN
100.0000 mL | Freq: Once | INTRAMUSCULAR | Status: AC | PRN
  Administered 2018-07-28: 100 mL via INTRAVENOUS

## 2018-07-28 NOTE — ED Notes (Signed)
Patient ambulatory to bathroom with steady gait at this time 

## 2018-07-28 NOTE — Consult Note (Signed)
Referring Provider:  EDP Primary Care Physician:  Venia Carbon, MD Primary Gastroenterologist:  Dr. Amedeo Plenty   Reason for Consultation: Rectal bleeding, colitis  HPI: Kirsten Harper is a 73 y.o. female with past medical history of ankylosing spondylitis currently on Humira presented to the hospital with abdominal pain and rectal bleeding.  Blood work on initial evaluation yesterday showed mild leukocytosis and elevated hemoglobin probably from hemoconcentration.  Normal CMP.  CT abdomen pelvis with contrast showed diffuse colonic wall thickening involving distal transverse colon, descending colon and sigmoid colon.  GI is consulted for further evaluation.  Patient seen and examined at bedside in the holding area.  She is complaining of constipation for last 1 month.  2 days ago she started noticing lower abdominal cramps followed by few episodes of bright blood in the commode.  Had some nausea but denied any vomiting.  Denies NSAID use.  Denies weight loss.  Nominal pain is improving.  She denies diarrhea.   Colonoscopy 5 years ago showed small tubular adenoma.   Past Medical History:  Diagnosis Date  . Ankylosing spondylitis ([place])   . Anxiety   . Arthritis   . Chronic lower back pain   . CKD (chronic kidney disease), stage II    "related to the Ankylosing spondylitis" (04/10/2016)  . COPD (chronic obstructive pulmonary disease) ([place])   . Eye disease   . Hypertension   . PONV (postoperative nausea and vomiting)    "didn't bother me the last time I had it in ~ 2015"    Past Surgical History:  Procedure Laterality Date  . BROW LIFT AND BLEPHAROPLASTY Bilateral ~ 2015  . CATARACT EXTRACTION W/ INTRAOCULAR LENS  IMPLANT, BILATERAL Bilateral 2000s  . MANDIBLE SURGERY  ~ 2000   "jaw had moved to left; broke my jaw in 5 places; put titanium in; stripped muscle from bone; wired jaw shut"  . TUBAL LIGATION  1979  . VAGINAL HYSTERECTOMY  1985    Prior to Admission medications    Medication Sig Start Date End Date Taking? Authorizing Provider  Adalimumab (HUMIRA PEN) 40 MG/0.4ML PNKT Inject 40 mg into the skin every 14 (fourteen) days. 05/29/18  Yes Deveshwar, Abel Presto, MD  albuterol (VENTOLIN HFA) 108 (90 Base) MCG/ACT inhaler INHALE 2 PUFFS INTO THE LUNGS EVERY 4 HOURS AS NEEDED FOR WHEEZING/ FOR SHORTNESS OF BREATH Patient taking differently: Inhale 2 puffs into the lungs every 4 (four) hours as needed for wheezing or shortness of breath.  06/24/18  Yes Venia Carbon, MD  amitriptyline (ELAVIL) 100 MG tablet Take 100 mg by mouth at bedtime.   Yes [provider]  amLODipine (NORVASC) 2.5 MG tablet Take 5 mg by mouth every evening.   Yes [provider]  aspirin EC 81 MG tablet Take 81 mg by mouth 2 (two) times daily as needed (for pain).   Yes [provider]  clonazePAM (KLONOPIN) 0.5 MG tablet Take 0.5 mg by mouth 3 (three) times daily as needed for anxiety.    Yes [provider]  diclofenac sodium (VOLTAREN) 1 % GEL Apply 3 grams to 3 large joints up to 3 times daily 06/09/18  Yes Deveshwar, Abel Presto, MD  dorzolamide-timolol (COSOPT) 22.3-6.8 MG/ML ophthalmic solution Place 1 drop into both eyes 2 (two) times daily.  12/17/16  Yes [provider]  losartan (COZAAR) 100 MG tablet Take 100 mg by mouth daily.  09/11/16  Yes [provider]  LOTEMAX 0.5 % ophthalmic suspension Place 1 drop into  both eyes 2 (two) times daily.  12/17/16  Yes [provider]  rOPINIRole (REQUIP) 1 MG tablet TAKE 3 TABLETS (3 MG TOTAL) BY MOUTH AT BEDTIME. 05/11/18  Yes Venia Carbon, MD  sulindac (CLINORIL) 150 MG tablet Take 1 tablet (150 mg total) by mouth 2 (two) times daily as needed. Patient taking differently: Take 150 mg by mouth 2 (two) times daily as needed (for Ankylosing spondylitis-related PAIN).  07/20/18  Yes Deveshwar, Abel Presto, MD  amLODipine (NORVASC) 5 MG tablet Take 1 tablet (5 mg total) by mouth daily. Patient not  taking: Reported on 07/27/2018 06/16/18   Viviana Simpler I, MD    Scheduled Meds: . metroNIDAZOLE  500 mg Oral Q8H   Continuous Infusions: . sodium chloride 100 mL/hr at 07/28/18 0605  . ciprofloxacin     PRN Meds:.  Allergies as of 07/27/2018 - Review Complete 07/27/2018  Allergen Reaction Noted  . Amoxapine and related Other (See Comments) 01/24/2012  . Seroquel [quetiapine] Other (See Comments) 07/27/2018  . Other Palpitations and Other (See Comments) 07/27/2018    Family History  Problem Relation Age of Onset  . Hypertension Mother   . Heart disease Sister   . Diabetes Sister   . Pulmonary fibrosis Brother   . Idiopathic pulmonary fibrosis Brother   . Alzheimer's disease Brother   . Dementia Brother     Social History   Socioeconomic History  . Marital status: Divorced    Spouse name: Not on file  . Number of children: 2  . Years of education: Not on file  . Highest education level: Not on file  Occupational History  . Occupation: Sales promotion account executive for [employer]    Comment: Retired  Scientific laboratory technician  . Financial resource strain: Not on file  . Food insecurity:    Worry: Not on file    Inability: Not on file  . Transportation needs:    Medical: Not on file    Non-medical: Not on file  Tobacco Use  . Smoking status: Former Smoker    Packs/day: 0.50    Years: 51.00    Pack years: 25.50    Types: Cigarettes  . Smokeless tobacco: Never Used  Substance and Sexual Activity  . Alcohol use: Yes    Alcohol/week: 0.0 standard drinks    Comment: occ  . Drug use: No  . Sexual activity: Never  Lifestyle  . Physical activity:    Days per week: Not on file    Minutes per session: Not on file  . Stress: Not on file  Relationships  . Social connections:    Talks on phone: Not on file    Gets together: Not on file    Attends religious service: Not on file    Active member of club or organization: Not on file    Attends meetings of clubs or organizations: Not on  file    Relationship status: Not on file  . Intimate partner violence:    Fear of current or ex partner: Not on file    Emotionally abused: Not on file    Physically abused: Not on file    Forced sexual activity: Not on file  Other Topics Concern  . Not on file  Social History Narrative   2 sons--not in area   Lives on property with her mother [name]      Has living will   Would want one of her sons, [name], to be Media planner   Requests DNR  No tube feeds    Review of Systems: Review of Systems  Constitutional: Negative for chills and fever.  HENT: Negative for hearing loss and tinnitus.   Eyes: Positive for redness. Negative for blurred vision and double vision.  Respiratory: Negative for cough and hemoptysis.   Cardiovascular: Negative for chest pain and palpitations.  Gastrointestinal: Positive for abdominal pain, blood in stool, constipation and nausea. Negative for heartburn and vomiting.  Genitourinary: Negative for dysuria and urgency.  Musculoskeletal: Positive for back pain, joint pain and myalgias.  Skin: Negative for rash.  Neurological: Negative for seizures and loss of consciousness.  Endo/Heme/Allergies: Does not bruise/bleed easily.  Psychiatric/Behavioral: Negative for hallucinations and suicidal ideas.    Physical Exam: Vital signs: Vitals:   07/28/18 0630 07/28/18 0700  BP: 134/77 (!) 112/98  Pulse: 94 97  Resp: 16   Temp:    SpO2: 96% 94%     Physical Exam  Constitutional: She is oriented to person, place, and time. She appears well-developed and well-nourished. No distress.  HENT:  Head: Normocephalic and atraumatic.  Mouth/Throat: Oropharynx is clear and moist. No oropharyngeal exudate.  Eyes: EOM are normal. No scleral icterus.  Neck: Normal range of motion. Neck supple.  Cardiovascular: Normal rate, regular rhythm and normal heart sounds.  Pulmonary/Chest: Effort normal and breath sounds normal. No respiratory distress.  Abdominal:  Soft. Bowel sounds are normal. She exhibits no distension. There is no abdominal tenderness. There is no rebound and no guarding.  Musculoskeletal: Normal range of motion.        General: No edema.  Neurological: She is alert and oriented to person, place, and time.  Skin: Skin is warm. No erythema.  Psychiatric: She has a normal mood and affect. Judgment normal.  Nursing note and vitals reviewed.   GI:  Lab Results: Recent Labs    07/27/18 1949 07/28/18 0114  WBC 14.3* 15.1*  HGB 15.1* 14.6  HCT 43.1 42.2  PLT 254 233   BMET Recent Labs    07/27/18 1949  NA 132*  K 3.8  CL 98  CO2 23  GLUCOSE 115*  BUN 10  CREATININE 0.72  CALCIUM 9.5   LFT Recent Labs    07/27/18 1949  PROT 7.6  ALBUMIN 4.0  AST 18  ALT 16  ALKPHOS 98  BILITOT 1.2   PT/INR No results for input(s): LABPROT, INR in the last 72 hours.   Studies/Results: Ct Abdomen Pelvis W Contrast  Result Date: 07/28/2018 CLINICAL DATA:  Rectal bleeding with abdominal cramping and generalized fatigue. History of chronic low back pain with ankylosing spondylitis. EXAM: CT ABDOMEN AND PELVIS WITH CONTRAST TECHNIQUE: Multidetector CT imaging of the abdomen and pelvis was performed using the standard protocol following bolus administration of intravenous contrast. CONTRAST:  143mL OMNIPAQUE IOHEXOL 300 MG/ML  SOLN COMPARISON:  None. FINDINGS: Lower chest: Lung bases are clear. Hepatobiliary: No focal liver abnormality is seen. No gallstones, gallbladder wall thickening, or biliary dilatation. Pancreas: Unremarkable. No pancreatic ductal dilatation or surrounding inflammatory changes. Spleen: Normal in size without focal abnormality. Adrenals/Urinary Tract: Adrenal glands are unremarkable. Kidneys are normal, without renal calculi, focal lesion, or hydronephrosis. Bladder is unremarkable. Stomach/Bowel: Stomach, small bowel, and colon are not abnormally distended. Diffuse colonic wall thickening involving the distal  transverse colon, descending colon, and sigmoid region. Mild pericolonic fat stranding. Changes likely to represent infectious or inflammatory colitis. Diverticulosis of the sigmoid colon without evidence of diverticulitis. Appendix is normal. Vascular/Lymphatic: No significant vascular findings are present. No  enlarged abdominal or pelvic lymph nodes. Reproductive: Status post hysterectomy. No adnexal masses. Other: No free air or free fluid in the abdomen. Abdominal wall musculature appears intact. Musculoskeletal: Degenerative changes in the spine. IMPRESSION: Diffuse colonic wall thickening involving the distal transverse colon, descending colon, and sigmoid colon. Changes likely to represent infectious or inflammatory colitis. Electronically Signed   By: Lucienne Capers M.D.   On: 07/28/2018 00:46    Impression/Plan: -Rectal bleeding with CT scan showing diffuse colonic wall thickening of distal transverse colon, descending colon and sigmoid colon.  Normal hemoglobin.  Mild leukocytosis.  No significant diarrhea. -Ankylosing spondylitis.  Currently on Humira. -History of adenomatous polyp.  Last colonoscopy 5 years ago.  Recommendations ----------------------- -Continue antibiotics for now.  Symptoms are already improving.  Recommend stool studies if she gets diarrhea. -Advance diet as tolerated. -Recommend outpatient colonoscopy if continues to do better clinically.  If ongoing bleeding and pain, she may need inpatient work-up. - GI will follow.     LOS: 0 days   Otis Brace  MD, FACP 07/28/2018, 8:15 AM  Contact #  [phone]

## 2018-07-28 NOTE — ED Notes (Signed)
Pt ambulated to restroom, says that she did not have another bowel movement with blood

## 2018-07-28 NOTE — ED Notes (Signed)
Breakfast tray @ bedside. 

## 2018-07-28 NOTE — Telephone Encounter (Signed)
I will follow her progress at the hospital

## 2018-07-28 NOTE — Progress Notes (Signed)
Kirsten Harper [id number] Admission Data: 07/28/2018 2:20 PM Attending Provider: Vianne Bulls, MD  PQA:ESLPNP, Theophilus Kinds, MD Consults/ Treatment Team: Treatment Team:  Laurence Spates, MD  Kirsten Harper is a 73 y.o. female patient admitted from ED awake, alert  & orientated  X 3,  Prior, VSS - Blood pressure 115/72, pulse 76, temperature 98.4 F (36.9 C), resp. rate 18, SpO2 98 %. on room air, no c/o shortness of breath, no c/o chest pain, no distress noted.    IV site WDL:  forearm right, condition patent and no redness and antecubital right, condition patent and no redness with a transparent dsg that's clean dry and intact.  Allergies:   Allergies  Allergen Reactions  . Amoxapine And Related Other (See Comments)    (Asendin- "Tricyclic antidepressant") Patient reports it did not work for her  . Seroquel [Quetiapine] Other (See Comments)    Worsened restless legs  . Other Palpitations and Other (See Comments)    Psych med (name not recalled by the patient, but it "starts with a Z")- Worsened restless legs, also (NOT Zyprexa; I asked)     Past Medical History:  Diagnosis Date  . Ankylosing spondylitis ([place])   . Anxiety   . Arthritis   . Chronic lower back pain   . CKD (chronic kidney disease), stage II    "related to the Ankylosing spondylitis" (04/10/2016)  . COPD (chronic obstructive pulmonary disease) ([place])   . Eye disease   . Hypertension   . PONV (postoperative nausea and vomiting)    "didn't bother me the last time I had it in ~ 2015"   Pt orientation to unit, room and routine. Information packet given to patient/family and safety video watched.  Admission INP armband ID verified with patient/family, and in place. SR up x 2, fall risk assessment complete with Patient and family verbalizing understanding of risks associated with falls. Pt verbalizes an understanding of how to use the call bell and to call for help before getting out of bed.  Skin, clean-dry- intact without  evidence of bruising, or skin tears.    Will cont to monitor and assist as needed.  Hiram Comber, RN 07/28/2018 2:20 PM

## 2018-07-28 NOTE — ED Notes (Signed)
Patient is in ct 

## 2018-07-28 NOTE — H&P (Signed)
History and Physical    Kirsten Harper:[id number] DOB: 1945-09-07 DOA: 07/27/2018  PCP: Venia Carbon, MD  Patient coming from: Home.  I have personally briefly reviewed patient's old medical records in Epic and care everywhere.   Chief Complaint: Nausea, abdominal cramps and bloody diarrhea.  HPI: Kirsten Harper is a 73 y.o. female with medical history significant of ankylosing spondylitis on Humira, hypertension, stage III chronic kidney disease, restless leg syndrome and COPD who presents to the hospital with 2 days history of abdominal cramps and bloody diarrhea.  According to the patient, she had one episode of constipation about 2 weeks ago that resolved.  The night before she started noticing some blood when she was frequently going to the bathroom with loose stool.  She had diffuse abdominal cramp, diffuse abdominal pain about 5 out of 10 in intensity before having multiple loose stools.  She felt nauseous all day.  Patient described initially blood mixed with stool and later only small amount of every time she goes to bathroom.  Her frequent diarrhea improved in last 24 hours, however she had 3 or 4 episodes where she only had a small amount of blood through her rectum.  At one episode in the ER.  No fever or chills.  Denies any vomiting.  Denies any headache.  Denies any sick contacts.  No recent travel.  No unusual food intake.  No recent use of antibiotics.  She is on Humira. ED Course: Hemodynamically stable.  Blood pressures are stable.  She has some leukocytosis.  Kidney functions are normal.  Patient underwent a CT scan of the abdomen pelvis with contrast that showed diffuse colitis, no perforations or complications.  At the time of my evaluation, patient feels less nauseous and wants to try food.  GI was consulted from ER.  Review of Systems: As per HPI otherwise 10 point review of systems negative.    Past Medical History:  Diagnosis Date  . Ankylosing spondylitis ([place])     . Anxiety   . Arthritis   . Chronic lower back pain   . CKD (chronic kidney disease), stage II    "related to the Ankylosing spondylitis" (04/10/2016)  . COPD (chronic obstructive pulmonary disease) ([place])   . Eye disease   . Hypertension   . PONV (postoperative nausea and vomiting)    "didn't bother me the last time I had it in ~ 2015"    Past Surgical History:  Procedure Laterality Date  . BROW LIFT AND BLEPHAROPLASTY Bilateral ~ 2015  . CATARACT EXTRACTION W/ INTRAOCULAR LENS  IMPLANT, BILATERAL Bilateral 2000s  . MANDIBLE SURGERY  ~ 2000   "jaw had moved to left; broke my jaw in 5 places; put titanium in; stripped muscle from bone; wired jaw shut"  . TUBAL LIGATION  1979  . VAGINAL HYSTERECTOMY  1985     reports that she has quit smoking. Her smoking use included cigarettes. She has a 25.50 pack-year smoking history. She has never used smokeless tobacco. She reports current alcohol use. She reports that she does not use drugs.  Allergies  Allergen Reactions  . Amoxapine And Related Other (See Comments)    (Asendin- "Tricyclic antidepressant") Patient reports it did not work for her  . Seroquel [Quetiapine] Other (See Comments)    Worsened restless legs  . Other Palpitations and Other (See Comments)    Psych med (name not recalled by the patient, but it "starts with a Z")- Worsened restless legs, also (NOT  Zyprexa; I asked)    Family History  Problem Relation Age of Onset  . Hypertension Mother   . Heart disease Sister   . Diabetes Sister   . Pulmonary fibrosis Brother   . Idiopathic pulmonary fibrosis Brother   . Alzheimer's disease Brother   . Dementia Brother      Prior to Admission medications   Medication Sig Start Date End Date Taking? Authorizing Provider  Adalimumab (HUMIRA PEN) 40 MG/0.4ML PNKT Inject 40 mg into the skin every 14 (fourteen) days. 05/29/18  Yes Deveshwar, Abel Presto, MD  albuterol (VENTOLIN HFA) 108 (90 Base) MCG/ACT inhaler INHALE 2 PUFFS INTO  THE LUNGS EVERY 4 HOURS AS NEEDED FOR WHEEZING/ FOR SHORTNESS OF BREATH Patient taking differently: Inhale 2 puffs into the lungs every 4 (four) hours as needed for wheezing or shortness of breath.  06/24/18  Yes Venia Carbon, MD  amitriptyline (ELAVIL) 100 MG tablet Take 100 mg by mouth at bedtime.   Yes [provider]  amLODipine (NORVASC) 2.5 MG tablet Take 5 mg by mouth every evening.   Yes [provider]  aspirin EC 81 MG tablet Take 81 mg by mouth 2 (two) times daily as needed (for pain).   Yes [provider]  clonazePAM (KLONOPIN) 0.5 MG tablet Take 0.5 mg by mouth 3 (three) times daily as needed for anxiety.    Yes [provider]  diclofenac sodium (VOLTAREN) 1 % GEL Apply 3 grams to 3 large joints up to 3 times daily 06/09/18  Yes Deveshwar, Abel Presto, MD  dorzolamide-timolol (COSOPT) 22.3-6.8 MG/ML ophthalmic solution Place 1 drop into both eyes 2 (two) times daily.  12/17/16  Yes [provider]  losartan (COZAAR) 100 MG tablet Take 100 mg by mouth daily.  09/11/16  Yes [provider]  LOTEMAX 0.5 % ophthalmic suspension Place 1 drop into both eyes 2 (two) times daily.  12/17/16  Yes [provider]  rOPINIRole (REQUIP) 1 MG tablet TAKE 3 TABLETS (3 MG TOTAL) BY MOUTH AT BEDTIME. 05/11/18  Yes Venia Carbon, MD  sulindac (CLINORIL) 150 MG tablet Take 1 tablet (150 mg total) by mouth 2 (two) times daily as needed. Patient taking differently: Take 150 mg by mouth 2 (two) times daily as needed (for Ankylosing spondylitis-related PAIN).  07/20/18  Yes Deveshwar, Abel Presto, MD  amLODipine (NORVASC) 5 MG tablet Take 1 tablet (5 mg total) by mouth daily. Patient not taking: Reported on 07/27/2018 06/16/18   Venia Carbon, MD    Physical Exam: Vitals:   07/28/18 0510 07/28/18 0530 07/28/18 0600 07/28/18 0630  BP: 138/82 129/73 (!) 144/73 134/77  Pulse: 93 92 92 94  Resp: _0 Temp:      TempSrc:      SpO2: 99% 97%  95% 96%    Constitutional: NAD, calm, comfortable Vitals:   07/28/18 0510 07/28/18 0530 07/28/18 0600 07/28/18 0630  BP: 138/82 129/73 (!) 144/73 134/77  Pulse: 93 92 92 94  Resp: _1 Temp:      TempSrc:      SpO2: 99% 97% 95% 96%   Eyes: PERRL, lids and conjunctivae normal ENMT: Mucous membranes are moist. Posterior pharynx clear of any exudate or lesions.Normal dentition.  Neck: normal, supple, no masses, no thyromegaly Respiratory: clear to auscultation bilaterally, no wheezing, no crackles. Normal respiratory effort. No accessory muscle use.  Cardiovascular: Regular rate and rhythm, no murmurs / rubs / gallops. No extremity edema. 2+ pedal  pulses. No carotid bruits.  Abdomen: Mild generalized tenderness with no rigidity or guarding.  No hepatosplenomegaly. Bowel sounds positive.  Musculoskeletal: no clubbing / cyanosis. No joint deformity upper and lower extremities. Good ROM, no contractures. Normal muscle tone.  Skin: no rashes, lesions, ulcers. No induration Neurologic: CN 2-12 grossly intact. Sensation intact, DTR normal. Strength 5/5 in all 4.  Psychiatric: Normal judgment and insight. Alert and oriented x 3. Normal mood.     Labs on Admission: I have personally reviewed following labs and imaging studies  CBC: Recent Labs  Lab 07/27/18 1949 07/28/18 0114  WBC 14.3* 15.1*  HGB 15.1* 14.6  HCT 43.1 42.2  MCV 90.9 92.5  PLT 254 417   Basic Metabolic Panel: Recent Labs  Lab 07/27/18 1949  NA 132*  K 3.8  CL 98  CO2 23  GLUCOSE 115*  BUN 10  CREATININE 0.72  CALCIUM 9.5   GFR: Estimated Creatinine Clearance: 75.1 mL/min (by C-G formula based on SCr of 0.72 mg/dL). Liver Function Tests: Recent Labs  Lab 07/27/18 1949  AST 18  ALT 16  ALKPHOS 98  BILITOT 1.2  PROT 7.6  ALBUMIN 4.0   No results for input(s): LIPASE, AMYLASE in the last 168 hours. No results for input(s): AMMONIA in the last 168 hours. Coagulation Profile: No results for  input(s): INR, PROTIME in the last 168 hours. Cardiac Enzymes: No results for input(s): CKTOTAL, CKMB, CKMBINDEX, TROPONINI in the last 168 hours. BNP (last 3 results) No results for input(s): PROBNP in the last 8760 hours. HbA1C: No results for input(s): HGBA1C in the last 72 hours. CBG: No results for input(s): GLUCAP in the last 168 hours. Lipid Profile: No results for input(s): CHOL, HDL, LDLCALC, TRIG, CHOLHDL, LDLDIRECT in the last 72 hours. Thyroid Function Tests: No results for input(s): TSH, T4TOTAL, FREET4, T3FREE, THYROIDAB in the last 72 hours. Anemia Panel: No results for input(s): VITAMINB12, FOLATE, FERRITIN, TIBC, IRON, RETICCTPCT in the last 72 hours. Urine analysis:    Component Value Date/Time   COLORURINE YELLOW 10/12/2016 [place] 10/12/2016 0852   LABSPEC 1.020 10/12/2016 0852   PHURINE 6.0 10/12/2016 0852   GLUCOSEU NEGATIVE 10/12/2016 0852   HGBUR SMALL (A) 10/12/2016 0852   BILIRUBINUR NEGATIVE 10/12/2016 0852   KETONESUR NEGATIVE 10/12/2016 0852   PROTEINUR NEGATIVE 10/12/2016 0852   NITRITE NEGATIVE 10/12/2016 0852   LEUKOCYTESUR NEGATIVE 10/12/2016 0852    Radiological Exams on Admission: Ct Abdomen Pelvis W Contrast  Result Date: 07/28/2018 CLINICAL DATA:  Rectal bleeding with abdominal cramping and generalized fatigue. History of chronic low back pain with ankylosing spondylitis. EXAM: CT ABDOMEN AND PELVIS WITH CONTRAST TECHNIQUE: Multidetector CT imaging of the abdomen and pelvis was performed using the standard protocol following bolus administration of intravenous contrast. CONTRAST:  143m OMNIPAQUE IOHEXOL 300 MG/ML  SOLN COMPARISON:  None. FINDINGS: Lower chest: Lung bases are clear. Hepatobiliary: No focal liver abnormality is seen. No gallstones, gallbladder wall thickening, or biliary dilatation. Pancreas: Unremarkable. No pancreatic ductal dilatation or surrounding inflammatory changes. Spleen: Normal in size without focal  abnormality. Adrenals/Urinary Tract: Adrenal glands are unremarkable. Kidneys are normal, without renal calculi, focal lesion, or hydronephrosis. Bladder is unremarkable. Stomach/Bowel: Stomach, small bowel, and colon are not abnormally distended. Diffuse colonic wall thickening involving the distal transverse colon, descending colon, and sigmoid region. Mild pericolonic fat stranding. Changes likely to represent infectious or inflammatory colitis. Diverticulosis of the sigmoid colon without evidence of diverticulitis. Appendix is normal. Vascular/Lymphatic: No significant  vascular findings are present. No enlarged abdominal or pelvic lymph nodes. Reproductive: Status post hysterectomy. No adnexal masses. Other: No free air or free fluid in the abdomen. Abdominal wall musculature appears intact. Musculoskeletal: Degenerative changes in the spine. IMPRESSION: Diffuse colonic wall thickening involving the distal transverse colon, descending colon, and sigmoid colon. Changes likely to represent infectious or inflammatory colitis. Electronically Signed   By: Lucienne Capers M.D.   On: 07/28/2018 00:46    EKG: Independently reviewed.  EKG shows normal sinus rhythm with no acute ST-T wave changes.  Assessment/Plan Principal Problem:   Colitis with rectal bleeding Active Problems:   Anxiety   Essential hypertension   Spondyloarthropathy HLA-B27 positive   COPD (chronic obstructive pulmonary disease) (H[facility])   RLS (restless legs syndrome)   Acute colitis with rectal bleeding: Suspected ischemic colitis.  GI consulted from ER.  Patient hemodynamically stable.  Hemoglobins are stable.  No active ongoing rectal bleed. Agree with admission given severity of symptoms. We will continue IV ciprofloxacin and Flagyl.  Patient already has improved diarrhea, if continues, will check for C. difficile and GI pathogen panel.  Will monitor hemoglobin every 12 hours. Rest as per GI, she had normal colonoscopy about 5  years ago, may need surveillance colonoscopy after acute symptoms subside.  Hypertension: Blood pressures are stable.  She can resume her home medications.  Anxiety and restless leg syndrome: Resume home medications.  COPD: With no exacerbation.  Continue bronchodilator as needed.  Ankylosing spondylitis: We will hold Humira until clinical improvement.    DVT prophylaxis: SCDs. Code Status: DNR.  Patient wishes. Family Communication: No family present at bedside. Disposition Plan: Home. Consults called: GI. Admission status: Observation.   Barb Merino MD [facility] Pager [phone]  If 7PM-7AM, please contact night-coverage [email or web address] Password [facility]  07/28/2018, 7:23 AM

## 2018-07-28 NOTE — Telephone Encounter (Signed)
Per chart review pt was seen at [facility] ED on 07/27/18 and pt was admitted to hospital per ED note. FYI to Dr Silvio Pate.

## 2018-07-29 ENCOUNTER — Telehealth: Payer: Self-pay | Admitting: Rheumatology

## 2018-07-29 ENCOUNTER — Telehealth: Payer: Self-pay | Admitting: Internal Medicine

## 2018-07-29 DIAGNOSIS — K625 Hemorrhage of anus and rectum: Secondary | ICD-10-CM | POA: Diagnosis not present

## 2018-07-29 DIAGNOSIS — M47819 Spondylosis without myelopathy or radiculopathy, site unspecified: Secondary | ICD-10-CM | POA: Diagnosis not present

## 2018-07-29 DIAGNOSIS — I10 Essential (primary) hypertension: Secondary | ICD-10-CM | POA: Diagnosis not present

## 2018-07-29 DIAGNOSIS — K529 Noninfective gastroenteritis and colitis, unspecified: Secondary | ICD-10-CM

## 2018-07-29 LAB — CBC
HCT: 35 % — ABNORMAL LOW (ref 36.0–46.0)
Hemoglobin: 11.7 g/dL — ABNORMAL LOW (ref 12.0–15.0)
MCH: 31 pg (ref 26.0–34.0)
MCHC: 33.4 g/dL (ref 30.0–36.0)
MCV: 92.8 fL (ref 80.0–100.0)
PLATELETS: 193 10*3/uL (ref 150–400)
RBC: 3.77 MIL/uL — AB (ref 3.87–5.11)
RDW: 13.6 % (ref 11.5–15.5)
WBC: 11.5 10*3/uL — ABNORMAL HIGH (ref 4.0–10.5)
nRBC: 0 % (ref 0.0–0.2)

## 2018-07-29 LAB — HIV ANTIBODY (ROUTINE TESTING W REFLEX): HIV SCREEN 4TH GENERATION: NONREACTIVE

## 2018-07-29 MED ORDER — POLYETHYLENE GLYCOL 3350 17 G PO PACK: 17.0000 g | PACK | Freq: Every day | ORAL | 0 refills | Status: DC

## 2018-07-29 MED ORDER — METRONIDAZOLE 500 MG PO TABS: 500.0000 mg | ORAL_TABLET | Freq: Three times a day (TID) | ORAL | 0 refills | Status: DC

## 2018-07-29 MED ORDER — CEFDINIR 300 MG PO CAPS: 300.0000 mg | ORAL_CAPSULE | Freq: Two times a day (BID) | ORAL | 0 refills | Status: DC

## 2018-07-29 MED ORDER — POLYETHYLENE GLYCOL 3350 17 G PO PACK: 17.0000 g | PACK | Freq: Every day | ORAL | Status: DC

## 2018-07-29 MED FILL — metroNIDAZOLE 500 MG TABS: 500 | 14 days supply | Qty: 42 | Fill #0

## 2018-07-29 MED FILL — POLYETHYLENE GLYCOL 3350 PO: 238 days supply | Qty: 238 | Fill #0

## 2018-07-29 MED FILL — CEFDINIR 300 MG CAPSULE: 300 | 14 days supply | Qty: 28 | Fill #0

## 2018-07-29 NOTE — Care Management Obs Status (Signed)
[place] NOTIFICATION   Patient Details  Name: Kirsten Harper MRN: [id number] Date of Birth: 1946/04/01   Medicare Observation Status Notification Given:  Yes    Sharin Mons, RN 07/29/2018, 9:52 AM

## 2018-07-29 NOTE — Telephone Encounter (Signed)
He is aware. No hospital f/u was made. Will ask Dr Silvio Pate if he needs to see her.

## 2018-07-29 NOTE — Telephone Encounter (Signed)
Pt called office to let Dr.Letvak know she was discharged form the hospital today. She was diagnosed with Colitis and she is on 2 antibiotics.

## 2018-07-29 NOTE — Progress Notes (Signed)
Nsg Discharge Note  Admit Date:  07/27/2018 Discharge date: 07/29/2018   EMONII WIENKE to be D/C'd Home per MD order.  AVS completed.  Copy for chart, and copy for patient signed, and dated. Patient/caregiver able to verbalize understanding.  Discharge Medication: Allergies as of 07/29/2018      Reactions   Amoxapine And Related Other (See Comments)   (Asendin- "Tricyclic antidepressant") Patient reports it did not work for her   Seroquel [quetiapine] Other (See Comments)   Worsened restless legs   Other Palpitations, Other (See Comments)   Psych med (name not recalled by the patient, but it "starts with a Z")- Worsened restless legs, also (NOT Zyprexa; I asked)      Medication List    TAKE these medications   Adalimumab 40 MG/0.4ML Pnkt Commonly known as:  HUMIRA PEN Inject 40 mg into the skin every 14 (fourteen) days.   albuterol 108 (90 Base) MCG/ACT inhaler Commonly known as:  VENTOLIN HFA INHALE 2 PUFFS INTO THE LUNGS EVERY 4 HOURS AS NEEDED FOR WHEEZING/ FOR SHORTNESS OF BREATH What changed:    how much to take  how to take this  when to take this  reasons to take this  additional instructions   amitriptyline 100 MG tablet Commonly known as:  ELAVIL Take 100 mg by mouth at bedtime.   amLODipine 5 MG tablet Commonly known as:  NORVASC Take 1 tablet (5 mg total) by mouth daily. What changed:  Another medication with the same name was removed. Continue taking this medication, and follow the directions you see here.   aspirin EC 81 MG tablet Take 81 mg by mouth 2 (two) times daily as needed (for pain).   cefdinir 300 MG capsule Commonly known as:  OMNICEF Take 1 capsule (300 mg total) by mouth 2 (two) times daily.   clonazePAM 0.5 MG tablet Commonly known as:  KLONOPIN Take 0.5 mg by mouth 3 (three) times daily as needed for anxiety.   diclofenac sodium 1 % Gel Commonly known as:  VOLTAREN Apply 3 grams to 3 large joints up to 3 times daily    dorzolamide-timolol 22.3-6.8 MG/ML ophthalmic solution Commonly known as:  COSOPT Place 1 drop into both eyes 2 (two) times daily.   losartan 100 MG tablet Commonly known as:  COZAAR Take 100 mg by mouth daily.   LOTEMAX 0.5 % ophthalmic suspension Generic drug:  loteprednol Place 1 drop into both eyes 2 (two) times daily.   metroNIDAZOLE 500 MG tablet Commonly known as:  FLAGYL Take 1 tablet (500 mg total) by mouth every 8 (eight) hours.   polyethylene glycol packet Commonly known as:  MIRALAX / GLYCOLAX Take 17 g by mouth daily.   rOPINIRole 1 MG tablet Commonly known as:  REQUIP TAKE 3 TABLETS (3 MG TOTAL) BY MOUTH AT BEDTIME.   sulindac 150 MG tablet Commonly known as:  CLINORIL Take 1 tablet (150 mg total) by mouth 2 (two) times daily as needed. What changed:  reasons to take this       Discharge Assessment: Vitals:   07/28/18 2125 07/29/18 0512  BP: 129/73 (!) 99/52  Pulse: 85 77  Resp: 16 12  Temp: 98.9 F (37.2 C) 98.3 F (36.8 C)  SpO2: 98% 95%   Skin clean, dry and intact without evidence of skin break down, no evidence of skin tears noted. IV catheter discontinued intact. Site without signs and symptoms of complications - no redness or edema noted at insertion site, patient  denies c/o pain - only slight tenderness at site.  Dressing with slight pressure applied.  D/c Instructions-Education: Discharge instructions given to patient/family with verbalized understanding. D/c education completed with patient/family including follow up instructions, medication list, d/c activities limitations if indicated, with other d/c instructions as indicated by MD - patient able to verbalize understanding, all questions fully answered. Patient instructed to return to ED, call 911, or call MD for any changes in condition.  Patient escorted via [place], and D/C home via private auto.  Erasmo Leventhal, RN 07/29/2018 12:16 PM

## 2018-07-29 NOTE — Discharge Summary (Signed)
PATIENT DETAILS Name: Kirsten Harper Age: 73 y.o. Sex: female Date of Birth: [birth year] MRN: [id number]. Admitting Physician: Vianne Bulls, MD [id number], Theophilus Kinds, MD  Admit Date: 07/27/2018 Discharge date: 07/29/2018  Recommendations for Outpatient Follow-up:  1. Follow up with PCP in 1-2 weeks 2. 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  She was found to have colitis on CT scan of the abdomen-and been to the hospitalist service.  She rapidly improved-by day of discharge-no further abdominal cramps-no diarrhea since admission.  Seen by gastroenterology-and cleared for discharge today.  Brief Hospital Course: Colitis: Probably infectious-symptoms have markedly improved after initiation of empiric antimicrobial therapy with Cipro and Flagyl.  No abdominal pain or diarrhea this morning-in fact has not had a bowel movement since admission.  This is presumed to be infectious-although she has a history of ankylosing spondylitis-and is at risk for IBD-given the rapid improvement this is not felt to be likely at this point.  Plans are to discharge her on 2 weeks of antimicrobial therapy (as recommended by GI) and have her follow-up with gastroenterology in the outpatient setting for possible endoscopic evaluation once colitis is completely resolved.  Hypertension: Resume losartan.  Ankylosing spondylitis: Continue to hold Humira until colitis has improved.  Rest of her medical problems were stable during the short overnight hospital  stay.  Procedures/Studies: None  Discharge Diagnoses:  Principal Problem:   Colitis with rectal bleeding Active Problems:   Anxiety   Essential hypertension   Spondyloarthropathy HLA-B27 positive   COPD (chronic obstructive pulmonary disease) (HCC)   RLS (restless legs syndrome)   Discharge Instructions:  Activity:  As tolerated with Full fall precautions use walker/cane & assistance as needed   Discharge Instructions    Diet - low sodium heart healthy   Complete by:  As directed    On a full liquid or a soft diet for at least 1 week   Discharge instructions   Complete by:  As directed    Follow with Primary MD  Venia Carbon, MD in 1 week  Follow with  [facility] gastroenterology in 1 week  Hold Humira until you have completed a course of antimicrobial therapy as prescribed-and you no longer have any symptoms of colitis.  Please get a complete blood count and chemistry panel checked by your Primary MD at your next visit, and again as instructed by your Primary MD.  Get Medicines reviewed and adjusted: Please take all your medications with you for your next visit with your Primary MD  Laboratory/radiological data: Please request your Primary MD to go over all hospital tests and procedure/radiological results at the follow up, please ask your Primary MD to get all Hospital records sent to his/her office.  In some cases, they will be blood work, cultures and biopsy results pending at the time of your discharge. Please request that your primary care M.D. follows up on these results.  Also Note the following: If you experience worsening of your admission symptoms, develop shortness of breath, life threatening emergency, suicidal or homicidal thoughts you must seek medical attention immediately by  calling 911 or calling your MD immediately  if symptoms less severe.  You must read complete instructions/literature along with all the possible adverse reactions/side effects for  all the Medicines you take and that have been prescribed to you. Take any new Medicines after you have completely understood and accpet all the possible adverse reactions/side effects.   Do not drive when taking Pain medications or sleeping medications (Benzodaizepines)  Do not take more than prescribed Pain, Sleep and Anxiety Medications. It is not advisable to combine anxiety,sleep and pain medications without talking with your primary care practitioner  Special Instructions: If you have smoked or chewed Tobacco  in the last 2 yrs please stop smoking, stop any regular Alcohol  and or any Recreational drug use.  Wear Seat belts while driving.  Please note: You were cared for by a hospitalist during your hospital stay. Once you are discharged, your primary care physician will handle any further medical issues. Please note that NO REFILLS for any discharge medications will be authorized once you are discharged, as it is imperative that you return to your primary care physician (or establish a relationship with a primary care physician if you do not have one) for your post hospital discharge needs so that they can reassess your need for medications and monitor your lab values.   Increase activity slowly   Complete by:  As directed      Allergies as of 07/29/2018      Reactions   Amoxapine And Related Other (See Comments)   (Asendin- "Tricyclic antidepressant") Patient reports it did not work for her   Seroquel [quetiapine] Other (See Comments)   Worsened restless legs   Other Palpitations, Other (See Comments)   Psych med (name not recalled by the patient, but it "starts with a Z")- Worsened restless legs, also (NOT Zyprexa; I asked)      Medication List    TAKE these medications   Adalimumab 40 MG/0.4ML Pnkt Commonly known as:  HUMIRA PEN Inject 40 mg into the skin every 14 (fourteen) days.   albuterol 108 (90 Base) MCG/ACT inhaler Commonly known as:  VENTOLIN HFA INHALE 2 PUFFS INTO  THE LUNGS EVERY 4 HOURS AS NEEDED FOR WHEEZING/ FOR SHORTNESS OF BREATH What changed:    how much to take  how to take this  when to take this  reasons to take this  additional instructions   amitriptyline 100 MG tablet Commonly known as:  ELAVIL Take 100 mg by mouth at bedtime.   amLODipine 5 MG tablet Commonly known as:  NORVASC Take 1 tablet (5 mg total) by mouth daily. What changed:  Another medication with the same name was removed. Continue taking this medication, and follow the directions you see here.   aspirin EC 81 MG tablet Take 81 mg by mouth 2 (two) times daily as needed (for pain).   cefdinir 300 MG capsule Commonly known as:  OMNICEF Take 1 capsule (300 mg total) by mouth 2 (two) times daily.   clonazePAM 0.5 MG tablet Commonly known as:  KLONOPIN Take 0.5 mg by mouth 3 (three) times daily as needed for anxiety.   diclofenac sodium 1 % Gel Commonly known as:  VOLTAREN Apply 3 grams to 3 large joints up to 3 times daily   dorzolamide-timolol 22.3-6.8 MG/ML ophthalmic solution Commonly known as:  COSOPT Place 1 drop into both eyes 2 (two) times daily.   losartan 100 MG tablet Commonly known as:  COZAAR Take 100 mg by mouth  daily.   LOTEMAX 0.5 % ophthalmic suspension Generic drug:  loteprednol Place 1 drop into both eyes 2 (two) times daily.   metroNIDAZOLE 500 MG tablet Commonly known as:  FLAGYL Take 1 tablet (500 mg total) by mouth every 8 (eight) hours.   polyethylene glycol packet Commonly known as:  MIRALAX / GLYCOLAX Take 17 g by mouth daily.   rOPINIRole 1 MG tablet Commonly known as:  REQUIP TAKE 3 TABLETS (3 MG TOTAL) BY MOUTH AT BEDTIME.   sulindac 150 MG tablet Commonly known as:  CLINORIL Take 1 tablet (150 mg total) by mouth 2 (two) times daily as needed. What changed:  reasons to take this      Follow-up Information    Brahmbhatt, Parag, MD. Schedule an appointment as soon as possible for a visit in 3 week(s).    Specialty:  Gastroenterology Why:  Rectal bleeding, abnormal CT scan showing colitis Contact information: [address] [place] [address] [phone]        Viviana Simpler I, MD. Schedule an appointment as soon as possible for a visit in 1 week(s).   Specialties:  Internal Medicine, Pediatrics Contact information: [place] [address] [phone]          Allergies  Allergen Reactions  . Amoxapine And Related Other (See Comments)    (Asendin- "Tricyclic antidepressant") Patient reports it did not work for her  . Seroquel [Quetiapine] Other (See Comments)    Worsened restless legs  . Other Palpitations and Other (See Comments)    Psych med (name not recalled by the patient, but it "starts with a Z")- Worsened restless legs, also (NOT Zyprexa; I asked)    Consultations:   GI  Other Procedures/Studies: Ct Abdomen Pelvis W Contrast  Result Date: 07/28/2018 CLINICAL DATA:  Rectal bleeding with abdominal cramping and generalized fatigue. History of chronic low back pain with ankylosing spondylitis. EXAM: CT ABDOMEN AND PELVIS WITH CONTRAST TECHNIQUE: Multidetector CT imaging of the abdomen and pelvis was performed using the standard protocol following bolus administration of intravenous contrast. CONTRAST:  117m OMNIPAQUE IOHEXOL 300 MG/ML  SOLN COMPARISON:  None. FINDINGS: Lower chest: Lung bases are clear. Hepatobiliary: No focal liver abnormality is seen. No gallstones, gallbladder wall thickening, or biliary dilatation. Pancreas: Unremarkable. No pancreatic ductal dilatation or surrounding inflammatory changes. Spleen: Normal in size without focal abnormality. Adrenals/Urinary Tract: Adrenal glands are unremarkable. Kidneys are normal, without renal calculi, focal lesion, or hydronephrosis. Bladder is unremarkable. Stomach/Bowel: Stomach, small bowel, and colon are not abnormally distended. Diffuse colonic wall thickening involving the  distal transverse colon, descending colon, and sigmoid region. Mild pericolonic fat stranding. Changes likely to represent infectious or inflammatory colitis. Diverticulosis of the sigmoid colon without evidence of diverticulitis. Appendix is normal. Vascular/Lymphatic: No significant vascular findings are present. No enlarged abdominal or pelvic lymph nodes. Reproductive: Status post hysterectomy. No adnexal masses. Other: No free air or free fluid in the abdomen. Abdominal wall musculature appears intact. Musculoskeletal: Degenerative changes in the spine. IMPRESSION: Diffuse colonic wall thickening involving the distal transverse colon, descending colon, and sigmoid colon. Changes likely to represent infectious or inflammatory colitis. Electronically Signed   By: WLucienne CapersM.D.   On: 07/28/2018 00:46     TODAY-DAY OF DISCHARGE:  Subjective:   SMadysen Fairclothtoday has no headache,no chest abdominal pain,no new weakness tingling or numbness, feels much better wants to go home today.   Objective:   Blood pressure (!) 99/52, pulse  77, temperature 98.3 F (36.8 C), resp. rate 12, height '5\' 7"'$  (1.702 m), weight 86.8 kg, SpO2 95 %.  Intake/Output Summary (Last 24 hours) at 07/29/2018 0936 Last data filed at 07/29/2018 0328 Gross per 24 hour  Intake 2351.12 ml  Output -  Net 2351.12 ml   Filed Weights   07/28/18 1411 07/29/18 0607  Weight: 89 kg 86.8 kg    Exam: Awake Alert, Oriented *3, No new F.N deficits, Normal affect [place].AT,PERRAL Supple Neck,No JVD, No cervical lymphadenopathy appriciated.  Symmetrical Chest wall movement, Good air movement bilaterally, CTAB RRR,No Gallops,Rubs or new Murmurs, No Parasternal Heave +ve B.Sounds, Abd Soft, Non tender, No organomegaly appriciated, No rebound -guarding or rigidity. No Cyanosis, Clubbing or edema, No new Rash or bruise   PERTINENT RADIOLOGIC STUDIES: Ct Abdomen Pelvis W Contrast  Result Date: 07/28/2018 CLINICAL DATA:  Rectal  bleeding with abdominal cramping and generalized fatigue. History of chronic low back pain with ankylosing spondylitis. EXAM: CT ABDOMEN AND PELVIS WITH CONTRAST TECHNIQUE: Multidetector CT imaging of the abdomen and pelvis was performed using the standard protocol following bolus administration of intravenous contrast. CONTRAST:  117m OMNIPAQUE IOHEXOL 300 MG/ML  SOLN COMPARISON:  None. FINDINGS: Lower chest: Lung bases are clear. Hepatobiliary: No focal liver abnormality is seen. No gallstones, gallbladder wall thickening, or biliary dilatation. Pancreas: Unremarkable. No pancreatic ductal dilatation or surrounding inflammatory changes. Spleen: Normal in size without focal abnormality. Adrenals/Urinary Tract: Adrenal glands are unremarkable. Kidneys are normal, without renal calculi, focal lesion, or hydronephrosis. Bladder is unremarkable. Stomach/Bowel: Stomach, small bowel, and colon are not abnormally distended. Diffuse colonic wall thickening involving the distal transverse colon, descending colon, and sigmoid region. Mild pericolonic fat stranding. Changes likely to represent infectious or inflammatory colitis. Diverticulosis of the sigmoid colon without evidence of diverticulitis. Appendix is normal. Vascular/Lymphatic: No significant vascular findings are present. No enlarged abdominal or pelvic lymph nodes. Reproductive: Status post hysterectomy. No adnexal masses. Other: No free air or free fluid in the abdomen. Abdominal wall musculature appears intact. Musculoskeletal: Degenerative changes in the spine. IMPRESSION: Diffuse colonic wall thickening involving the distal transverse colon, descending colon, and sigmoid colon. Changes likely to represent infectious or inflammatory colitis. Electronically Signed   By: WLucienne CapersM.D.   On: 07/28/2018 00:46     PERTINENT LAB RESULTS: CBC: Recent Labs    07/28/18 0114 07/29/18 0333  WBC 15.1* 11.5*  HGB 14.6 11.7*  HCT 42.2 35.0*  PLT 233  193   CMET CMP     Component Value Date/Time   NA 132 (L) 07/27/2018 1949   K 3.8 07/27/2018 1949   CL 98 07/27/2018 1949   CO2 23 07/27/2018 1949   GLUCOSE 115 (H) 07/27/2018 1949   BUN 10 07/27/2018 1949   CREATININE 0.72 07/27/2018 1949   CREATININE 0.84 07/13/2018 1415   CALCIUM 9.5 07/27/2018 1949   PROT 7.6 07/27/2018 1949   ALBUMIN 4.0 07/27/2018 1949   AST 18 07/27/2018 1949   ALT 16 07/27/2018 1949   ALKPHOS 98 07/27/2018 1949   BILITOT 1.2 07/27/2018 1949   GFRNONAA >60 07/27/2018 1949   GFRNONAA 70 07/13/2018 1415   GFRAA >60 07/27/2018 1949   GFRAA 82 07/13/2018 1415    GFR Estimated Creatinine Clearance: 74.1 mL/min (by C-G formula based on SCr of 0.72 mg/dL). No results for input(s): LIPASE, AMYLASE in the last 72 hours. No results for input(s): CKTOTAL, CKMB, CKMBINDEX, TROPONINI in the last 72 hours. Invalid input(s): POCBNP No results for input(s):  DDIMER in the last 72 hours. No results for input(s): HGBA1C in the last 72 hours. No results for input(s): CHOL, HDL, LDLCALC, TRIG, CHOLHDL, LDLDIRECT in the last 72 hours. No results for input(s): TSH, T4TOTAL, T3FREE, THYROIDAB in the last 72 hours.  Invalid input(s): FREET3 No results for input(s): VITAMINB12, FOLATE, FERRITIN, TIBC, IRON, RETICCTPCT in the last 72 hours. Coags: No results for input(s): INR in the last 72 hours.  Invalid input(s): PT Microbiology: No results found for this or any previous visit (from the past 240 hour(s)).  FURTHER DISCHARGE INSTRUCTIONS:  Get Medicines reviewed and adjusted: Please take all your medications with you for your next visit with your Primary MD  Laboratory/radiological data: Please request your Primary MD to go over all hospital tests and procedure/radiological results at the follow up, please ask your Primary MD to get all Hospital records sent to his/her office.  In some cases, they will be blood work, cultures and biopsy results pending at the time  of your discharge. Please request that your primary care M.D. goes through all the records of your hospital data and follows up on these results.  Also Note the following: If you experience worsening of your admission symptoms, develop shortness of breath, life threatening emergency, suicidal or homicidal thoughts you must seek medical attention immediately by calling 911 or calling your MD immediately  if symptoms less severe.  You must read complete instructions/literature along with all the possible adverse reactions/side effects for all the Medicines you take and that have been prescribed to you. Take any new Medicines after you have completely understood and accpet all the possible adverse reactions/side effects.   Do not drive when taking Pain medications or sleeping medications (Benzodaizepines)  Do not take more than prescribed Pain, Sleep and Anxiety Medications. It is not advisable to combine anxiety,sleep and pain medications without talking with your primary care practitioner  Special Instructions: If you have smoked or chewed Tobacco  in the last 2 yrs please stop smoking, stop any regular Alcohol  and or any Recreational drug use.  Wear Seat belts while driving.  Please note: You were cared for by a hospitalist during your hospital stay. Once you are discharged, your primary care physician will handle any further medical issues. Please note that NO REFILLS for any discharge medications will be authorized once you are discharged, as it is imperative that you return to your primary care physician (or establish a relationship with a primary care physician if you do not have one) for your post hospital discharge needs so that they can reassess your need for medications and monitor your lab values.  Total Time spent coordinating discharge including counseling, education and face to face time equals 35 minutes.  SignedOren Binet 07/29/2018 9:36 AM

## 2018-07-29 NOTE — Progress Notes (Signed)
[facility] Gastroenterology Progress Note  Kirsten Harper 73 y.o. 03-Feb-1946  CC: Rectal bleeding/colitis   Subjective: Patient feeling better. denied diarrhea.  Abdominal pain is improving.  Denies nausea vomiting.    Objective: Vital signs in last 24 hours: Vitals:   07/28/18 2125 07/29/18 0512  BP: 129/73 (!) 99/52  Pulse: 85 77  Resp: 16 12  Temp: 98.9 F (37.2 C) 98.3 F (36.8 C)  SpO2: 98% 95%    Physical Exam:  General.  Alert/oriented x3.  Not in acute distress Abdomen.  Soft, nontender, nondistended, bowel sounds present. Lower extremity.  No edema. Psych :   Mood and affect normal.  Lab Results: Recent Labs    07/27/18 1949  NA 132*  K 3.8  CL 98  CO2 23  GLUCOSE 115*  BUN 10  CREATININE 0.72  CALCIUM 9.5   Recent Labs    07/27/18 1949  AST 18  ALT 16  ALKPHOS 98  BILITOT 1.2  PROT 7.6  ALBUMIN 4.0   Recent Labs    07/28/18 0114 07/29/18 0333  WBC 15.1* 11.5*  HGB 14.6 11.7*  HCT 42.2 35.0*  MCV 92.5 92.8  PLT 233 193   No results for input(s): LABPROT, INR in the last 72 hours.    Assessment/Plan: -Rectal bleeding with CT scan showing diffuse colonic wall thickening of distal transverse colon, descending colon and sigmoid colon.  Normal hemoglobin.  Mild leukocytosis.  No significant diarrhea. -Ankylosing spondylitis.  Currently on Humira. -History of adenomatous polyp.  Last colonoscopy 5 years ago.  Recommendations ----------------------- -Patient with clinical improvement in symptoms.  Continues to have issues with constipation.  Although inflammatory bowel disease such as ulcerative colitis is in differential but it is less likely given lack of diarrhea and given the fact that she is already on humira.  -Advance diet to soft.  MiraLAX as needed for constipation. -Antibiotics for total 2 weeks. -Recommend outpatient colonoscopy.  Follow-up in GI clinic in 2 to 4 weeks. -GI will sign off.  Call us back if needed   Otis Brace MD, [place] 07/29/2018, 8:36 AM  Contact #  [phone]

## 2018-07-29 NOTE — Telephone Encounter (Signed)
Patient called stating she was at the hospital today and was given 2 antibiotics.  Patient is requesting a return call to let her know if she should take her Humira or not.

## 2018-07-29 NOTE — Progress Notes (Signed)
Overnight pt had small bowel movement, brown with smear of dark red blood.

## 2018-07-30 ENCOUNTER — Ambulatory Visit: Payer: Medicare Other

## 2018-07-30 ENCOUNTER — Telehealth: Payer: Self-pay | Admitting: *Deleted

## 2018-07-30 NOTE — Telephone Encounter (Signed)
Transition Care Management Follow-up Telephone Call   Date discharged?    How have you been since you were released from the hospital? "pretty good"   Do you understand why you were in the hospital? yes   Do you understand the discharge instructions? yes   Where were you discharged to? home   Items Reviewed:  Medications reviewed:yes  Allergies reviewed: yes  Dietary changes reviewed: yes  Referrals reviewed: yes   Functional Questionnaire:   Activities of Daily Living (ADLs):   She states they are independent in the following: independent in all areas    Any transportation issues/concerns?: no   Any patient concerns? Yes; wanting to discuss labs and imaging results   Confirmed importance and date/time of follow-up visits scheduled yes Provider Appointment booked with Dr Silvio Pate 08/03/2018  Confirmed with patient if condition begins to worsen call PCP or go to the ER.  Patient was given the office number and encouraged to call back with question or concerns.  : yes

## 2018-07-30 NOTE — Telephone Encounter (Signed)
She does need follow up with me Please set this up

## 2018-07-30 NOTE — Telephone Encounter (Signed)
She is scheduled for 08-03-18

## 2018-07-30 NOTE — Telephone Encounter (Signed)
Patient advised she will need clearance from her doctor to restart Humira once the infection is resolved.

## 2018-07-30 NOTE — Telephone Encounter (Signed)
Patient states she was in the hospital and was placed on on 2 medications. She was placed on Flagyl and Omnicef. Patient states she is on Humira and is aware to hold her Humira. Patient states she wanted to advise [place] she was in the hospital for  Colitis. Patient states she has been set up for colonoscopy and to see a GI specialist.

## 2018-07-30 NOTE — Telephone Encounter (Signed)
She will need clearance from her doctor to restart Humira once the infection is resolved.

## 2018-08-03 ENCOUNTER — Ambulatory Visit (INDEPENDENT_AMBULATORY_CARE_PROVIDER_SITE_OTHER): Payer: Medicare Other | Admitting: Internal Medicine

## 2018-08-03 ENCOUNTER — Encounter: Payer: Self-pay | Admitting: Internal Medicine

## 2018-08-03 VITALS — BP 122/70 | HR 68 | Temp 98.4°F | Ht 67.0 in | Wt 191.0 lb

## 2018-08-03 DIAGNOSIS — K529 Noninfective gastroenteritis and colitis, unspecified: Secondary | ICD-10-CM | POA: Diagnosis not present

## 2018-08-03 DIAGNOSIS — F410 Panic disorder [episodic paroxysmal anxiety] without agoraphobia: Secondary | ICD-10-CM | POA: Diagnosis not present

## 2018-08-03 NOTE — Assessment & Plan Note (Signed)
Improved on cefdinir and metronidazole Suspicious for IBD---especially since she has had mild chronic bowel issues Will finish out the antibiotics Has appt with GI--will need colonoscopy Recheck labs (make sure hemoglobin stable and had low sodium)

## 2018-08-03 NOTE — Progress Notes (Signed)
Subjective:    Patient ID: Kirsten Harper, female    DOB: 11-29-1945, 73 y.o.   MRN: [id number]  HPI Here or hospital follow up Had been constipated for a while---just little bits over several times Started with cramps one night---tried drinking cold water which usually lets her clear out When she finally went --it was red mud This helped the cramping---but it persisted more mild Then passed blood later in the day (1 week ago)  No recent travel No suspect food May have had low grade fever--but not striking Well water at home--but she doesn't drink it  Hospitalized CT scan showed generalized colitis Treated with antibiotics and fluids  Cramps are gone---but some fecal incontinence Bowels are still very loose Just on bland diet No blood now--but noticed blood tinge in underwear after washing  Current Outpatient Medications on File Prior to Visit  Medication Sig Dispense Refill  . albuterol (VENTOLIN HFA) 108 (90 Base) MCG/ACT inhaler INHALE 2 PUFFS INTO THE LUNGS EVERY 4 HOURS AS NEEDED FOR WHEEZING/ FOR SHORTNESS OF BREATH (Patient taking differently: Inhale 2 puffs into the lungs every 4 (four) hours as needed for wheezing or shortness of breath. ) 18 Inhaler 2  . amitriptyline (ELAVIL) 100 MG tablet Take 100 mg by mouth at bedtime.    Marland Kitchen amLODipine (NORVASC) 5 MG tablet Take 1 tablet (5 mg total) by mouth daily. 90 tablet 3  . aspirin EC 81 MG tablet Take 81 mg by mouth 2 (two) times daily as needed (for pain).    . cefdinir (OMNICEF) 300 MG capsule Take 1 capsule (300 mg total) by mouth 2 (two) times daily. 28 capsule 0  . clonazePAM (KLONOPIN) 0.5 MG tablet Take 0.5 mg by mouth 3 (three) times daily as needed for anxiety.     . diclofenac sodium (VOLTAREN) 1 % GEL Apply 3 grams to 3 large joints up to 3 times daily 3 Tube 3  . dorzolamide-timolol (COSOPT) 22.3-6.8 MG/ML ophthalmic solution Place 1 drop into both eyes 2 (two) times daily.     Marland Kitchen losartan (COZAAR) 100 MG tablet  Take 100 mg by mouth daily.     Marland Kitchen LOTEMAX 0.5 % ophthalmic suspension Place 1 drop into both eyes 2 (two) times daily.     . metroNIDAZOLE (FLAGYL) 500 MG tablet Take 1 tablet (500 mg total) by mouth every 8 (eight) hours. 42 tablet 0  . rOPINIRole (REQUIP) 1 MG tablet TAKE 3 TABLETS (3 MG TOTAL) BY MOUTH AT BEDTIME. 270 tablet 3  . sulindac (CLINORIL) 150 MG tablet Take 1 tablet (150 mg total) by mouth 2 (two) times daily as needed. (Patient taking differently: Take 150 mg by mouth 2 (two) times daily as needed (for Ankylosing spondylitis-related PAIN). ) 60 tablet 2  . Adalimumab (HUMIRA PEN) 40 MG/0.4ML PNKT Inject 40 mg into the skin every 14 (fourteen) days. (Patient not taking: Reported on 08/03/2018) 3 each 0  . polyethylene glycol (MIRALAX / GLYCOLAX) packet Take 17 g by mouth daily. (Patient not taking: Reported on 08/03/2018) 14 each 0   No current facility-administered medications on file prior to visit.     Allergies  Allergen Reactions  . Amoxapine And Related Other (See Comments)    (Asendin- "Tricyclic antidepressant") Patient reports it did not work for her  . Seroquel [Quetiapine] Other (See Comments)    Worsened restless legs  . Other Palpitations and Other (See Comments)    Psych med (name not recalled by the patient, but it "  starts with a Z")- Worsened restless legs, also (NOT Zyprexa; I asked)    Past Medical History:  Diagnosis Date  . Ankylosing spondylitis ([place])   . Anxiety   . Arthritis   . Chronic lower back pain   . CKD (chronic kidney disease), stage II    "related to the Ankylosing spondylitis" (04/10/2016)  . COPD (chronic obstructive pulmonary disease) ([place])   . Eye disease   . Hypertension   . PONV (postoperative nausea and vomiting)    "didn't bother me the last time I had it in ~ 2015"    Past Surgical History:  Procedure Laterality Date  . BROW LIFT AND BLEPHAROPLASTY Bilateral ~ 2015  . CATARACT EXTRACTION W/ INTRAOCULAR LENS  IMPLANT, BILATERAL  Bilateral 2000s  . MANDIBLE SURGERY  ~ 2000   "jaw had moved to left; broke my jaw in 5 places; put titanium in; stripped muscle from bone; wired jaw shut"  . TUBAL LIGATION  1979  . VAGINAL HYSTERECTOMY  1985    Family History  Problem Relation Age of Onset  . Hypertension Mother   . Heart disease Sister   . Diabetes Sister   . Pulmonary fibrosis Brother   . Idiopathic pulmonary fibrosis Brother   . Alzheimer's disease Brother   . Dementia Brother     Social History   Socioeconomic History  . Marital status: Divorced    Spouse name: Not on file  . Number of children: 2  . Years of education: Not on file  . Highest education level: Not on file  Occupational History  . Occupation: Sales promotion account executive for [employer]    Comment: Retired  Scientific laboratory technician  . Financial resource strain: Not on file  . Food insecurity:    Worry: Not on file    Inability: Not on file  . Transportation needs:    Medical: Not on file    Non-medical: Not on file  Tobacco Use  . Smoking status: Former Smoker    Packs/day: 0.50    Years: 51.00    Pack years: 25.50    Types: Cigarettes  . Smokeless tobacco: Never Used  Substance and Sexual Activity  . Alcohol use: Yes    Alcohol/week: 0.0 standard drinks    Comment: occ  . Drug use: No  . Sexual activity: Never  Lifestyle  . Physical activity:    Days per week: Not on file    Minutes per session: Not on file  . Stress: Not on file  Relationships  . Social connections:    Talks on phone: Not on file    Gets together: Not on file    Attends religious service: Not on file    Active member of club or organization: Not on file    Attends meetings of clubs or organizations: Not on file    Relationship status: Not on file  . Intimate partner violence:    Fear of current or ex partner: Not on file    Emotionally abused: Not on file    Physically abused: Not on file    Forced sexual activity: Not on file  Other Topics Concern  . Not on file    Social History Narrative   2 sons--not in area   Lives on property with her mother [name]      Has living will   Would want one of her sons, [name], to be Media planner   Requests DNR   No tube feeds   Review of  Systems Ongoing bad joint pain--has had injections put in [place] onto knees ---tripped on carpet (November 13th) Increasing pain getting up Worried about her weight---actually tried boost (not realizing it was to gain weight) No cough or breathing problems    Objective:   Physical Exam  Constitutional: She appears well-developed. No distress.  Neck: No thyromegaly present.  Cardiovascular: Normal rate, regular rhythm and normal heart sounds. Exam reveals no gallop.  No murmur heard. Respiratory: Effort normal and breath sounds normal. No respiratory distress. She has no wheezes. She has no rales.  GI: Soft. Bowel sounds are normal. She exhibits no distension. There is no abdominal tenderness. There is no rebound and no guarding.  Musculoskeletal:        General: No tenderness or edema.  Lymphadenopathy:    She has no cervical adenopathy.           Assessment & Plan:

## 2018-08-04 ENCOUNTER — Telehealth: Payer: Self-pay | Admitting: Rheumatology

## 2018-08-04 ENCOUNTER — Ambulatory Visit: Payer: Medicare Other | Admitting: Rehabilitation

## 2018-08-04 LAB — RENAL FUNCTION PANEL
Albumin: 4.3 g/dL (ref 3.5–5.2)
BUN: 7 mg/dL (ref 6–23)
CO2: 30 mEq/L (ref 19–32)
CREATININE: 0.8 mg/dL (ref 0.40–1.20)
Calcium: 9.9 mg/dL (ref 8.4–10.5)
Chloride: 101 mEq/L (ref 96–112)
GFR: 70.73 mL/min (ref >60.00)
Glucose, Bld: 98 mg/dL (ref 70–99)
Phosphorus: 3.4 mg/dL (ref 2.3–4.6)
Potassium: 4.6 mEq/L (ref 3.5–5.1)
Sodium: 136 mEq/L (ref 135–145)

## 2018-08-04 LAB — CBC
HCT: 40.4 % (ref 36.0–46.0)
Hemoglobin: 14.2 g/dL (ref 12.0–15.0)
MCHC: 35.1 g/dL (ref 30.0–36.0)
MCV: 91.4 fl (ref 78.0–100.0)
Platelets: 294 10*3/uL (ref 150.0–400.0)
RBC: 4.42 Mil/uL (ref 3.87–5.11)
RDW: 13.5 % (ref 11.5–15.5)
WBC: 11.9 10*3/uL — ABNORMAL HIGH (ref 4.0–10.5)

## 2018-08-04 MED ORDER — PREDNISONE 5 MG PO TABS: ORAL_TABLET | ORAL | 0 refills | Status: DC

## 2018-08-04 NOTE — Telephone Encounter (Signed)
Patient called stating she would like to schedule injections in her fingers, but requested to speak with you directly before scheduling an appointment.

## 2018-08-04 NOTE — Telephone Encounter (Signed)
Patient was having a flare at the last visit as she was off Humira.  She had synovitis in multiple joints.  Please call in prednisone 5 mg tablets, 4 for 2 days, 3 for 2 days, 2 for 2 days, 1 for 2 days and half for 2 days.

## 2018-08-04 NOTE — Telephone Encounter (Signed)
Patient advised prescription sent to the pharmacy for Prednisone.  

## 2018-08-04 NOTE — Telephone Encounter (Signed)
Patient states she is having swelling in her second and fifth fingers on her right. Patient states she is having trouble bending them. Patient is requesting cortisone injection. Patient had cortisone injections in bilateral knees on 07/20/18. Please advise.

## 2018-08-06 ENCOUNTER — Ambulatory Visit: Payer: Medicare Other | Admitting: Rehabilitation

## 2018-08-11 ENCOUNTER — Ambulatory Visit: Payer: Self-pay | Admitting: Rehabilitation

## 2018-08-12 ENCOUNTER — Ambulatory Visit: Payer: Self-pay | Admitting: Physical Therapy

## 2018-08-13 ENCOUNTER — Ambulatory Visit: Payer: Self-pay | Admitting: Rehabilitation

## 2018-08-14 DIAGNOSIS — K625 Hemorrhage of anus and rectum: Secondary | ICD-10-CM | POA: Diagnosis not present

## 2018-08-14 DIAGNOSIS — R197 Diarrhea, unspecified: Secondary | ICD-10-CM | POA: Diagnosis not present

## 2018-08-14 DIAGNOSIS — R933 Abnormal findings on diagnostic imaging of other parts of digestive tract: Secondary | ICD-10-CM | POA: Diagnosis not present

## 2018-08-14 DIAGNOSIS — Z86010 Personal history of colonic polyps: Secondary | ICD-10-CM | POA: Diagnosis not present

## 2018-08-17 DIAGNOSIS — R197 Diarrhea, unspecified: Secondary | ICD-10-CM | POA: Diagnosis not present

## 2018-08-18 ENCOUNTER — Ambulatory Visit: Payer: Medicare Other | Admitting: Physical Therapy

## 2018-08-18 DIAGNOSIS — R197 Diarrhea, unspecified: Secondary | ICD-10-CM | POA: Diagnosis not present

## 2018-08-20 ENCOUNTER — Ambulatory Visit: Payer: Self-pay | Admitting: Rehabilitation

## 2018-08-25 ENCOUNTER — Telehealth: Payer: Self-pay | Admitting: Internal Medicine

## 2018-08-25 MED ORDER — LOSARTAN POTASSIUM 100 MG PO TABS: 100.0000 mg | ORAL_TABLET | Freq: Every day | ORAL | 3 refills | Status: DC

## 2018-08-25 NOTE — Telephone Encounter (Signed)
Best number [phone] Pt called needing to get a refill on Losartan potassium 100mg  tab 1 daily  New rx for dr Silvio Pate Previous pcp ([facility] [place]) refilled last time  [address]

## 2018-08-25 NOTE — Telephone Encounter (Signed)
Results mailed to patient;.

## 2018-09-08 DIAGNOSIS — H0233 Blepharochalasis right eye, unspecified eyelid: Secondary | ICD-10-CM | POA: Diagnosis not present

## 2018-09-08 DIAGNOSIS — H35371 Puckering of macula, right eye: Secondary | ICD-10-CM | POA: Diagnosis not present

## 2018-09-08 DIAGNOSIS — M450 Ankylosing spondylitis of multiple sites in spine: Secondary | ICD-10-CM | POA: Diagnosis not present

## 2018-09-08 DIAGNOSIS — H209 Unspecified iridocyclitis: Secondary | ICD-10-CM | POA: Diagnosis not present

## 2018-09-08 DIAGNOSIS — H35033 Hypertensive retinopathy, bilateral: Secondary | ICD-10-CM | POA: Diagnosis not present

## 2018-09-08 DIAGNOSIS — Z961 Presence of intraocular lens: Secondary | ICD-10-CM | POA: Diagnosis not present

## 2018-09-11 ENCOUNTER — Encounter: Payer: Self-pay | Admitting: Rehabilitation

## 2018-09-11 NOTE — Therapy (Signed)
[place] [facility] [address] [place], [place], [address] Phone: [phone]   Fax:  [phone]  Patient Details  Name: Kirsten Harper MRN: [id number] Date of Birth: [birth year] Referring Provider:  No ref. provider found  Encounter Date: 09/11/2018   PHYSICAL THERAPY DISCHARGE SUMMARY  Visits from Start of Care: 7  Current functional level related to goals / functional outcomes: Unsure as pt was admitted to hospital with colitis.  Called to report that she would get new referral when able to return to PT.    Remaining deficits: PT Long Term Goals - 06/19/18 1049      PT LONG TERM GOAL #1   Title  Pt will be independent with final HEP (both land and pool) in order to indicate improved functional mobility and decreased fall risk.  (Target Date: 08/18/18)    Time  8    Period  Weeks    Status  New    Target Date  08/18/18      PT LONG TERM GOAL #2   Title  Pt will perform 5TSS in </=14 secs w/o UE support in order to indicate improved BLE strength and decreased fall risk.      Time  8    Period  Weeks    Status  New      PT LONG TERM GOAL #3   Title  Pt will improve gait speed to >/=3.68 ft/sec in order to indicate improved efficiency of gait.      Time  8    Period  Weeks    Status  New      PT LONG TERM GOAL #4   Title  Pt will ambulate 1000' over paved and unlevel grassy surfaces w/ LRAD at mod I level in order to indicate safe leisure and home negotiation.      Time  8    Period  Weeks    Status  New         Education / Equipment: HEP, rollator   Plan: Patient agrees to discharge.  Patient goals were not met. Patient is being discharged due to a change in medical status.  ?????    Cameron Sprang, PT, MPT [facility] [address] [place], [place], [address] Phone: [phone]   Fax:  [phone] 09/11/18, 11:35 AM

## 2018-09-22 DIAGNOSIS — K625 Hemorrhage of anus and rectum: Secondary | ICD-10-CM | POA: Diagnosis not present

## 2018-09-22 DIAGNOSIS — D125 Benign neoplasm of sigmoid colon: Secondary | ICD-10-CM | POA: Diagnosis not present

## 2018-09-22 DIAGNOSIS — R933 Abnormal findings on diagnostic imaging of other parts of digestive tract: Secondary | ICD-10-CM | POA: Diagnosis not present

## 2018-09-22 DIAGNOSIS — K648 Other hemorrhoids: Secondary | ICD-10-CM | POA: Diagnosis not present

## 2018-09-22 DIAGNOSIS — K5730 Diverticulosis of large intestine without perforation or abscess without bleeding: Secondary | ICD-10-CM | POA: Diagnosis not present

## 2018-09-22 DIAGNOSIS — Z86010 Personal history of colonic polyps: Secondary | ICD-10-CM | POA: Diagnosis not present

## 2018-09-22 DIAGNOSIS — D122 Benign neoplasm of ascending colon: Secondary | ICD-10-CM | POA: Diagnosis not present

## 2018-09-25 DIAGNOSIS — D122 Benign neoplasm of ascending colon: Secondary | ICD-10-CM | POA: Diagnosis not present

## 2018-09-25 DIAGNOSIS — D125 Benign neoplasm of sigmoid colon: Secondary | ICD-10-CM | POA: Diagnosis not present

## 2018-10-13 ENCOUNTER — Other Ambulatory Visit: Payer: Self-pay | Admitting: *Deleted

## 2018-10-13 MED ORDER — ADALIMUMAB 40 MG/0.4ML ~~LOC~~ AJKT: 40.0000 mg | AUTO-INJECTOR | SUBCUTANEOUS | 0 refills | Status: DC

## 2018-10-13 NOTE — Telephone Encounter (Signed)
Received refill request from Kirsten Harper   Last Visit: 06/09/18 Next Visit: 12/24/18 Labs: 08/03/18 WBC 11.9  TB Gold: 12/23/17 Neg   Okay to refill per Dr. Estanislado Pandy

## 2018-10-15 ENCOUNTER — Other Ambulatory Visit: Payer: Self-pay

## 2018-10-15 DIAGNOSIS — J449 Chronic obstructive pulmonary disease, unspecified: Secondary | ICD-10-CM

## 2018-10-15 NOTE — Telephone Encounter (Signed)
Inbound call to triage - requesting refill for Ventolin Inhaler and Clonazepam

## 2018-10-16 MED ORDER — ALBUTEROL SULFATE HFA 108 (90 BASE) MCG/ACT IN AERS: 2.0000 | INHALATION_SPRAY | RESPIRATORY_TRACT | 2 refills | Status: DC | PRN

## 2018-10-16 MED ORDER — CLONAZEPAM 0.5 MG PO TABS: 0.5000 mg | ORAL_TABLET | Freq: Three times a day (TID) | ORAL | 0 refills | Status: DC | PRN

## 2018-10-16 NOTE — Telephone Encounter (Signed)
Clonazepam last filled 08-02-18 #30 Last OV 08-03-18 Next OV 05-06-19 [facility]

## 2018-11-09 DIAGNOSIS — F410 Panic disorder [episodic paroxysmal anxiety] without agoraphobia: Secondary | ICD-10-CM | POA: Diagnosis not present

## 2018-11-10 ENCOUNTER — Ambulatory Visit: Payer: Self-pay | Admitting: Physician Assistant

## 2018-11-27 ENCOUNTER — Other Ambulatory Visit: Payer: Self-pay | Admitting: Internal Medicine

## 2018-11-27 NOTE — Telephone Encounter (Signed)
Last filled 10-16-18 #30 Last OV 06-16-18 Next OV 05-06-19 [facility]

## 2018-12-10 NOTE — Progress Notes (Signed)
Office Visit Note  Patient: Kirsten Harper             Date of Birth: [birth year]           MRN: [id number]             PCP: Venia Carbon, MD Referring: Venia Carbon, MD Visit Date: 12/24/2018 Occupation: _0 @  Subjective:  Right olecranon bursitis    History of Present Illness: Kirsten Harper is a 73 y.o. female with history of ankylosing spondylitis and osteoarthritis.  She is prescribed Humira 40 mg sq injections every 14 days.  She states she had several infections including a skin infection, bronchitis, and colitis so she has several interruptions in taking humira. her last injection was 2 weeks ago but prior to that she was off of it for awhile. She states she hit her left 2nd toe on a step yesterday and has it wrapped up today.  she denies any signs of infection at this time. She reports right olecranon bursitis.  She denies any tenderness at this time. She denies any other joint pain or joint swelling.  She denies any SI joint pain. She states her knee joints are doing better after the cortisone injections on 07/20/18.  She denies any knee joint pain or joint swelling at this time.    Activities of Daily Living:  Patient reports morning stiffness for 0 minute.   Patient Reports nocturnal pain.  Difficulty dressing/grooming: Denies Difficulty climbing stairs: Denies Difficulty getting out of chair: Denies Difficulty using hands for taps, buttons, cutlery, and/or writing: Denies  Review of Systems  Constitutional: Positive for fatigue.  HENT: Negative for mouth sores, mouth dryness and nose dryness.   Eyes: Positive for pain and dryness. Negative for visual disturbance.  Respiratory: Negative for cough, hemoptysis, shortness of breath, wheezing and difficulty breathing.   Cardiovascular: Negative for chest pain, palpitations, hypertension and swelling in legs/feet.  Gastrointestinal: Negative for abdominal pain, blood in stool, constipation and diarrhea.  Endocrine:  Negative for excessive thirst and increased urination.  Genitourinary: Negative for difficulty urinating and painful urination.  Musculoskeletal: Negative for arthralgias, joint pain, joint swelling, myalgias, muscle weakness, morning stiffness, muscle tenderness and myalgias.  Skin: Negative for color change, pallor, rash, hair loss, nodules/bumps, redness, skin tightness, ulcers and sensitivity to sunlight.  Allergic/Immunologic: Negative for susceptible to infections.  Neurological: Negative for dizziness, fainting, numbness, headaches and weakness.  Hematological: Negative for swollen glands.  Psychiatric/Behavioral: Positive for sleep disturbance. Negative for depressed mood. The patient is not nervous/anxious.     PMFS History:  Patient Active Problem List   Diagnosis Date Noted  . Colitis 07/28/2018  . Preventative health care 05/04/2018  . Advance directive discussed with patient 05/04/2018  . RLS (restless legs syndrome) 04/25/2017  . Mood disorder ([place]) 04/08/2017  . Sensory loss 04/08/2017  . COPD (chronic obstructive pulmonary disease) ([place]) 04/08/2017  . Ankylosing spondylitis of lumbosacral region ([place]) 09/24/2016  . GAD (generalized anxiety disorder) 08/27/2016  . 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 Spondyloarthropathy HLA-B27 positive 06/06/2016  . Iritis 06/06/2016  . High risk medication use 06/06/2016  . Primary osteoarthritis of both hands 06/06/2016  . Smoker 06/06/2016  . COPD exacerbation ([place]) 04/10/2016  . Anxiety 04/10/2016  . Essential hypertension     Past Medical History:  Diagnosis Date  . Ankylosing spondylitis ([place])   . Anxiety   . Arthritis   . Chronic lower back pain   . CKD (  chronic kidney disease), stage II    "related to the Ankylosing spondylitis" (04/10/2016)  . COPD (chronic obstructive pulmonary disease) ([place])   . Eye disease   . Hypertension   . PONV (postoperative nausea and vomiting)    "didn't bother me  the last time I had it in ~ 2015"    Family History  Problem Relation Age of Onset  . Hypertension Mother   . Heart disease Sister   . Diabetes Sister   . Pulmonary fibrosis Brother   . Idiopathic pulmonary fibrosis Brother   . Alzheimer's disease Brother   . Dementia Brother    Past Surgical History:  Procedure Laterality Date  . BROW LIFT AND BLEPHAROPLASTY Bilateral ~ 2015  . CATARACT EXTRACTION W/ INTRAOCULAR LENS  IMPLANT, BILATERAL Bilateral 2000s  . CYST REMOVAL TRUNK  12/2018  . MANDIBLE SURGERY  ~ 2000   "jaw had moved to left; broke my jaw in 5 places; put titanium in; stripped muscle from bone; wired jaw shut"  . TUBAL LIGATION  1979  . VAGINAL HYSTERECTOMY  1985   Social History   Social History Narrative   2 sons--not in area   Lives on property with her mother [name]      Has living will   Would want one of her sons, [name], to be Media planner   Requests DNR   No tube feeds   Immunization History  Administered Date(s) Administered  . Influenza Inj Mdck Quad Pf 04/16/2018  . Pneumococcal Conjugate-13 05/10/2013  . Pneumococcal Polysaccharide-23 09/20/2014  . Tdap 04/30/2011  . Zoster Recombinat (Shingrix) 10/01/2016     Objective: Vital Signs: BP 121/68 (BP Location: Left Arm, Patient Position: Sitting, Cuff Size: Normal)   Pulse (!) 102   Resp 12   Ht _0  (1.702 m)   Wt 197 lb (89.4 kg)   BMI 30.85 kg/m    Physical Exam Vitals signs and nursing note reviewed.  Constitutional:      Appearance: She is well-developed.  HENT:     Head: Normocephalic and atraumatic.  Eyes:     Conjunctiva/sclera: Conjunctivae normal.  Neck:     Musculoskeletal: Normal range of motion.  Cardiovascular:     Rate and Rhythm: Normal rate and regular rhythm.     Heart sounds: Normal heart sounds.  Pulmonary:     Effort: Pulmonary effort is normal.     Breath sounds: Normal breath sounds.  Abdominal:     General: Bowel sounds are normal.     Palpations:  Abdomen is soft.  Lymphadenopathy:     Cervical: No cervical adenopathy.  Skin:    General: Skin is warm and dry.     Capillary Refill: Capillary refill takes less than 2 seconds.  Neurological:     Mental Status: She is alert and oriented to person, place, and time.  Psychiatric:        Behavior: Behavior normal.      Musculoskeletal Exam: C-spine, thoracic spine, and lumbar spine good ROM.  No midline spinal tenderness.  No SI joint tenderness.  Discomfort with lumbar ROM.  Shoulder joints, elbow joints, wrist joints, MCPs, PIPs, and DIPs good ROM with no synovitis.  Right olecranon bursitis noted.  Hip joints, knee joints, ankle joints, MTPs, PIPs, and DIPs good ROM with no synovitis.  No warmth or effusion of knee joints.  No tenderness or swelling of ankle joints. Left 2nd toe is wrapped in a bandage.   CDAI Exam: CDAI Score: -  Patient Global: -; Provider Global: - Swollen: -; Tender: - Joint Exam   No joint exam has been documented for this visit   There is currently no information documented on the homunculus. Go to the Rheumatology activity and complete the homunculus joint exam.  Investigation: No additional findings.  Imaging: No results found.  Recent Labs: Lab Results  Component Value Date   WBC 11.9 (H) 08/03/2018   HGB 14.2 08/03/2018   PLT 294.0 08/03/2018   NA 136 08/03/2018   K 4.6 08/03/2018   CL 101 08/03/2018   CO2 30 08/03/2018   GLUCOSE 98 08/03/2018   BUN 7 08/03/2018   CREATININE 0.80 08/03/2018   BILITOT 1.2 07/27/2018   ALKPHOS 98 07/27/2018   AST 18 07/27/2018   ALT 16 07/27/2018   PROT 7.6 07/27/2018   ALBUMIN 4.3 08/03/2018   CALCIUM 9.9 08/03/2018   GFRAA >60 07/27/2018   QFTBGOLDPLUS NEGATIVE 12/23/2017    Speciality Comments: No specialty comments available.  Procedures:  No procedures performed Allergies: Amoxapine and related, Seroquel [quetiapine], and Other   Assessment / Plan:     Visit Diagnoses: Ankylosing  spondylitis of lumbosacral region ([place]) - Severe disease-She has chronic lower back pain especially when lying supine.  She has no SI joint tenderness on exam today.  She has mild midline spinal tenderness.  Her last dose of Humira was 2 weeks ago but prior to that she has had interruptions in her dosing due to recurrent infections.  She plans on resuming Humira 40 mg subcutaneous injections every 14 days.  She is advised to notify us if develops increased joint pain or joint swelling.  She will follow-up in the office in 5 months.  High risk medication use:  She is prescribed Humira 40 mg every 14 days.  Last TB gold negative on 12/23/2017.  Due for TB gold today and will monitor yearly.  Most recent CBC within normal limits except for elevated WBC on 08/03/2018.  Most recent CMP within normal limits on 07/27/2018.  Due for CBC/CMP today and will monitor every 3 months.  Standing orders are in place.  She received the flu vaccine in October and is up-to-date with her pneumonia and shingles vaccine.  - Plan: CBC with Differential/Platelet, COMPLETE METABOLIC PANEL WITH GFR, QuantiFERON-TB Gold Plus  HLA B 27 Positive    Uveitis - She has not had any recent flares or recurrences.  She will continue on Humira 40 mg sq injections every 14 days.   Primary osteoarthritis of both hands -She has PIP DIP synovial thickening consistent with osteoarthritis of bilateral hands.  She has complete fist formation bilaterally.  Joint protection and muscle strengthening were discussed.  Primary osteoarthritis of both knees -No warmth or effusion.  She has good range of motion with no discomfort.  She had bilateral cortisone injections on 07/20/2018 and the provided significant pain relief.  She has no discomfort in her knee joints at this time.  Other medical conditions are listed as follows:   History of hypertension   History of COPD   History of anxiety   Former smoker   Orders: Orders Placed This Encounter   Procedures  . CBC with Differential/Platelet  . COMPLETE METABOLIC PANEL WITH GFR  . QuantiFERON-TB Gold Plus   No orders of the defined types were placed in this encounter.    Follow-Up Instructions: Return in about 5 months (around 05/26/2019) for Ankylosing Spondylitis, Osteoarthritis.   Ofilia Neas, PA-C   I examined  and evaluated the patient with Hazel Sams PA.  Patient has been doing well with no synovitis on myexamination.  She states she has intermittent pain as she was not taking Humira.  She plans to resume Humira on a regular basis now.  The plan of care was discussed as noted above.  Bo Merino, MD  Note - This record has been created using Editor, commissioning.  Chart creation errors have been sought, but may not always  have been located. Such creation errors do not reflect on  the standard of medical care.

## 2018-12-14 HISTORY — PX: CYST REMOVAL TRUNK: SHX6283

## 2018-12-15 DIAGNOSIS — L720 Epidermal cyst: Secondary | ICD-10-CM | POA: Diagnosis not present

## 2018-12-24 ENCOUNTER — Encounter: Payer: Self-pay | Admitting: Rheumatology

## 2018-12-24 ENCOUNTER — Other Ambulatory Visit: Payer: Self-pay

## 2018-12-24 ENCOUNTER — Ambulatory Visit (INDEPENDENT_AMBULATORY_CARE_PROVIDER_SITE_OTHER): Payer: Medicare Other | Admitting: Rheumatology

## 2018-12-24 VITALS — BP 121/68 | HR 102 | Resp 12 | Ht 67.0 in | Wt 197.0 lb

## 2018-12-24 DIAGNOSIS — M47819 Spondylosis without myelopathy or radiculopathy, site unspecified: Secondary | ICD-10-CM | POA: Diagnosis not present

## 2018-12-24 DIAGNOSIS — Z8659 Personal history of other mental and behavioral disorders: Secondary | ICD-10-CM | POA: Diagnosis not present

## 2018-12-24 DIAGNOSIS — M457 Ankylosing spondylitis of lumbosacral region: Secondary | ICD-10-CM | POA: Diagnosis not present

## 2018-12-24 DIAGNOSIS — Z87891 Personal history of nicotine dependence: Secondary | ICD-10-CM | POA: Diagnosis not present

## 2018-12-24 DIAGNOSIS — Z8679 Personal history of other diseases of the circulatory system: Secondary | ICD-10-CM | POA: Diagnosis not present

## 2018-12-24 DIAGNOSIS — M19041 Primary osteoarthritis, right hand: Secondary | ICD-10-CM

## 2018-12-24 DIAGNOSIS — H209 Unspecified iridocyclitis: Secondary | ICD-10-CM | POA: Diagnosis not present

## 2018-12-24 DIAGNOSIS — M170 Bilateral primary osteoarthritis of knee: Secondary | ICD-10-CM | POA: Diagnosis not present

## 2018-12-24 DIAGNOSIS — Z79899 Other long term (current) drug therapy: Secondary | ICD-10-CM

## 2018-12-24 DIAGNOSIS — M19042 Primary osteoarthritis, left hand: Secondary | ICD-10-CM | POA: Diagnosis not present

## 2018-12-24 DIAGNOSIS — Z8709 Personal history of other diseases of the respiratory system: Secondary | ICD-10-CM

## 2018-12-25 NOTE — Progress Notes (Signed)
CBC WNL.  Glucose is elevated-127.  Rest of CMP WNL.

## 2018-12-26 LAB — CBC WITH DIFFERENTIAL/PLATELET
Absolute Monocytes: 781 cells/uL (ref 200–950)
Basophils Absolute: 50 cells/uL (ref 0–200)
Basophils Relative: 0.6 %
Eosinophils Absolute: 227 cells/uL (ref 15–500)
Eosinophils Relative: 2.7 %
HCT: 43.7 % (ref 35.0–45.0)
Hemoglobin: 14.9 g/dL (ref 11.7–15.5)
Lymphs Abs: 1949 cells/uL (ref 850–3900)
MCH: 31.9 pg (ref 27.0–33.0)
MCHC: 34.1 g/dL (ref 32.0–36.0)
MCV: 93.6 fL (ref 80.0–100.0)
MPV: 10 fL (ref 7.5–12.5)
Monocytes Relative: 9.3 %
Neutro Abs: 5393 cells/uL (ref 1500–7800)
Neutrophils Relative %: 64.2 %
Platelets: 226 10*3/uL (ref 140–400)
RBC: 4.67 10*6/uL (ref 3.80–5.10)
RDW: 12 % (ref 11.0–15.0)
Total Lymphocyte: 23.2 %
WBC: 8.4 10*3/uL (ref 3.8–10.8)

## 2018-12-26 LAB — COMPLETE METABOLIC PANEL WITH GFR
AG Ratio: 1.7 (calc) (ref 1.0–2.5)
ALT: 16 U/L (ref 6–29)
AST: 15 U/L (ref 10–35)
Albumin: 4.4 g/dL (ref 3.6–5.1)
Alkaline phosphatase (APISO): 84 U/L (ref 37–153)
BUN: 15 mg/dL (ref 7–25)
CO2: 28 mmol/L (ref 20–32)
Calcium: 9.6 mg/dL (ref 8.6–10.4)
Chloride: 102 mmol/L (ref 98–110)
Creat: 0.92 mg/dL (ref 0.60–0.93)
GFR, Est African American: 73 mL/min/{1.73_m2} (ref >=60)
GFR, Est Non African American: 63 mL/min/{1.73_m2} (ref >=60)
Globulin: 2.6 g/dL (calc) (ref 1.9–3.7)
Glucose, Bld: 127 mg/dL — ABNORMAL HIGH (ref 65–99)
Potassium: 3.8 mmol/L (ref 3.5–5.3)
Sodium: 139 mmol/L (ref 135–146)
Total Bilirubin: 0.7 mg/dL (ref 0.2–1.2)
Total Protein: 7 g/dL (ref 6.1–8.1)

## 2018-12-26 LAB — QUANTIFERON-TB GOLD PLUS
Mitogen-NIL: >10 IU/mL
NIL: 0.01 IU/mL
QuantiFERON-TB Gold Plus: NEGATIVE
TB1-NIL: 0 IU/mL
TB2-NIL: 0.01 IU/mL

## 2018-12-28 ENCOUNTER — Telehealth: Payer: Self-pay | Admitting: Rheumatology

## 2018-12-28 ENCOUNTER — Other Ambulatory Visit: Payer: Self-pay | Admitting: Pharmacist

## 2018-12-28 ENCOUNTER — Other Ambulatory Visit: Payer: Self-pay | Admitting: Internal Medicine

## 2018-12-28 DIAGNOSIS — M457 Ankylosing spondylitis of lumbosacral region: Secondary | ICD-10-CM

## 2018-12-28 MED ORDER — HUMIRA (2 PEN) 40 MG/0.4ML ~~LOC~~ AJKT: 40.0000 mg | AUTO-INJECTOR | SUBCUTANEOUS | 0 refills | Status: DC

## 2018-12-28 NOTE — Telephone Encounter (Signed)
Patient had questions regarding her prescription of Humira.  Patient was transferred to [facility].

## 2018-12-28 NOTE — Telephone Encounter (Signed)
Please advise on refill request Upcoming appt with Family Medicine Viviana Simpler, MD) 05/06/2019 at 2:15 PM

## 2018-12-28 NOTE — Progress Notes (Signed)
TB gold negative

## 2018-12-28 NOTE — Telephone Encounter (Signed)
Patient left a voicemail stating that she called her pharmacy for a refill and she was told to call here and maybe she could speed the refill up.

## 2018-12-29 DIAGNOSIS — F410 Panic disorder [episodic paroxysmal anxiety] without agoraphobia: Secondary | ICD-10-CM | POA: Diagnosis not present

## 2019-01-08 ENCOUNTER — Other Ambulatory Visit: Payer: Self-pay | Admitting: Internal Medicine

## 2019-01-08 DIAGNOSIS — J449 Chronic obstructive pulmonary disease, unspecified: Secondary | ICD-10-CM

## 2019-01-12 DIAGNOSIS — M450 Ankylosing spondylitis of multiple sites in spine: Secondary | ICD-10-CM | POA: Diagnosis not present

## 2019-01-12 DIAGNOSIS — H35033 Hypertensive retinopathy, bilateral: Secondary | ICD-10-CM | POA: Diagnosis not present

## 2019-01-12 DIAGNOSIS — H35371 Puckering of macula, right eye: Secondary | ICD-10-CM | POA: Diagnosis not present

## 2019-01-12 DIAGNOSIS — Z961 Presence of intraocular lens: Secondary | ICD-10-CM | POA: Diagnosis not present

## 2019-01-12 DIAGNOSIS — H209 Unspecified iridocyclitis: Secondary | ICD-10-CM | POA: Diagnosis not present

## 2019-01-22 ENCOUNTER — Telehealth: Payer: Self-pay | Admitting: Rheumatology

## 2019-01-22 NOTE — Telephone Encounter (Signed)
Called patient and advised a prescription for 3 kits (6 pens) was sent to [facility] on 12/28/2018. Patient verbalized understanding and will call [facility] to request another shipment.

## 2019-01-22 NOTE — Telephone Encounter (Signed)
Patient left a message on voicemail stating she took her last Humira, and needs a refill sent in. Per patient, she got only 2 injections this last time, and usually gets 6. Please call to advise.

## 2019-01-27 ENCOUNTER — Telehealth: Payer: Self-pay | Admitting: Rheumatology

## 2019-01-27 ENCOUNTER — Other Ambulatory Visit: Payer: Self-pay | Admitting: Internal Medicine

## 2019-01-27 NOTE — Telephone Encounter (Signed)
Spoke with patient and advised we sent in her prescription for Humira 12/28/18 and received a fax back stating they received the prescription. Patient states she will contact [facility].

## 2019-01-27 NOTE — Telephone Encounter (Signed)
Patient left a message on voicemail 01/26/2019 stating [facility] informed her they were waiting on something from [place] to be able to get patient her Humira. Patient was checking on status of that.

## 2019-01-27 NOTE — Telephone Encounter (Signed)
Patient is requesting a medication refill. Last refill was 12/28/18. Last OV 08/03/18 AWV is scheduled for October.

## 2019-02-16 DIAGNOSIS — F411 Generalized anxiety disorder: Secondary | ICD-10-CM | POA: Diagnosis not present

## 2019-02-16 DIAGNOSIS — Z79899 Other long term (current) drug therapy: Secondary | ICD-10-CM | POA: Diagnosis not present

## 2019-03-02 ENCOUNTER — Other Ambulatory Visit: Payer: Self-pay | Admitting: Internal Medicine

## 2019-03-02 NOTE — Telephone Encounter (Signed)
Last filled 01-28-19 #30 Last OV 06-16-18 Next OV 05-06-19 [facility]

## 2019-03-17 ENCOUNTER — Other Ambulatory Visit: Payer: Self-pay | Admitting: Rheumatology

## 2019-03-17 ENCOUNTER — Other Ambulatory Visit: Payer: Self-pay | Admitting: Internal Medicine

## 2019-03-17 NOTE — Telephone Encounter (Signed)
ok 

## 2019-03-17 NOTE — Telephone Encounter (Signed)
Last Visit: 12/24/18 Next Visit: 05/27/19 Labs: 12/24/18 CBC WNL. Glucose is elevated-127. Rest of CMP WNL.  Okay to refill Sul[place]ac?

## 2019-03-19 ENCOUNTER — Other Ambulatory Visit: Payer: Self-pay | Admitting: Internal Medicine

## 2019-03-31 ENCOUNTER — Other Ambulatory Visit: Payer: Self-pay | Admitting: Internal Medicine

## 2019-03-31 NOTE — Telephone Encounter (Signed)
Last filled 03-02-19 #30 Last OV 06-16-18 Next OV 05-06-19 [facility]

## 2019-04-02 ENCOUNTER — Other Ambulatory Visit: Payer: Self-pay | Admitting: *Deleted

## 2019-04-02 DIAGNOSIS — M457 Ankylosing spondylitis of lumbosacral region: Secondary | ICD-10-CM

## 2019-04-02 MED ORDER — HUMIRA (2 PEN) 40 MG/0.4ML ~~LOC~~ AJKT: 40.0000 mg | AUTO-INJECTOR | SUBCUTANEOUS | 0 refills | Status: DC

## 2019-04-02 NOTE — Telephone Encounter (Signed)
Refill request received via fax from [facility]  Last Visit: 12/24/18 Next Visit: 05/27/19 Labs: 12/24/18 CBC WNL. Glucose is elevated-127. Rest of CMP WNL. TB Gold : 12/24/18   Left message for patient to advise she is due for labs.   Okay to refill 30 day supply per  Dr. Estanislado Pandy

## 2019-04-03 IMAGING — CT CT ABD-PELV W/ CM
2 of 5 series · 16 of 46 positions shown, 18 images · IV contrast (omnipaque)
Comparison: None.

CLINICAL DATA: Rectal bleeding with abdominal cramping and
generalized fatigue. History of chronic low back pain with
ankylosing spondylitis.

EXAM:
CT ABDOMEN AND PELVIS WITH CONTRAST
TECHNIQUE: Multidetector CT imaging of the abdomen and pelvis was performed
using the standard protocol following bolus administration of
intravenous contrast.
CONTRAST:  100mL OMNIPAQUE IOHEXOL 300 MG/ML  SOLN

[Series 3: abd/ pelvis 5.0 i30f 2 · axial · 0.96mm/px · z∈[-462,-52]mm · 13 of 92 slices shown, 15 images]
[im 5/92  soft-tissue]
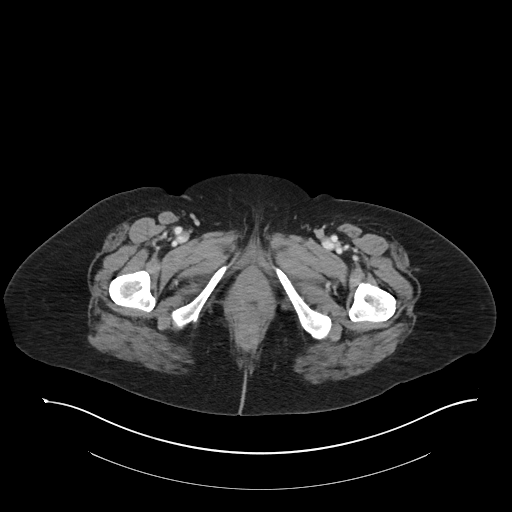
[im 5/92  bone]
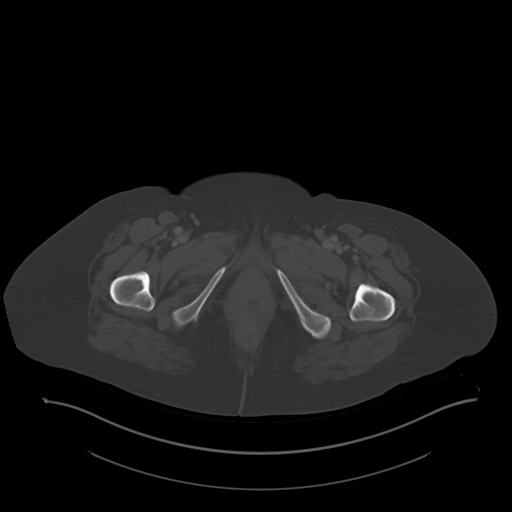
[im 14/92  soft-tissue]
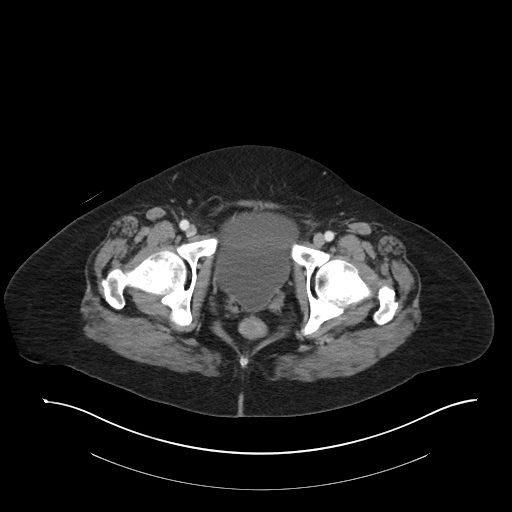
[im 19/92  soft-tissue]
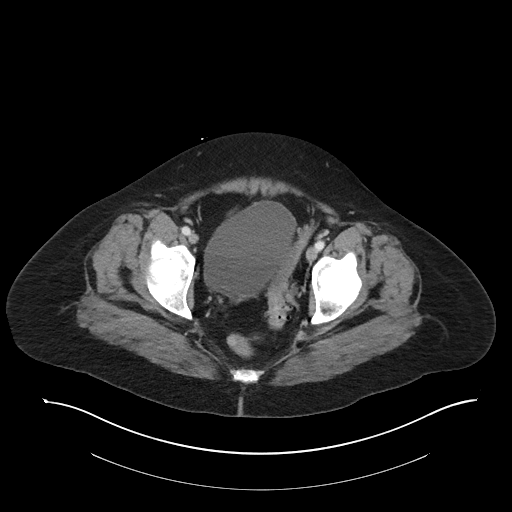
[im 28/92  soft-tissue]
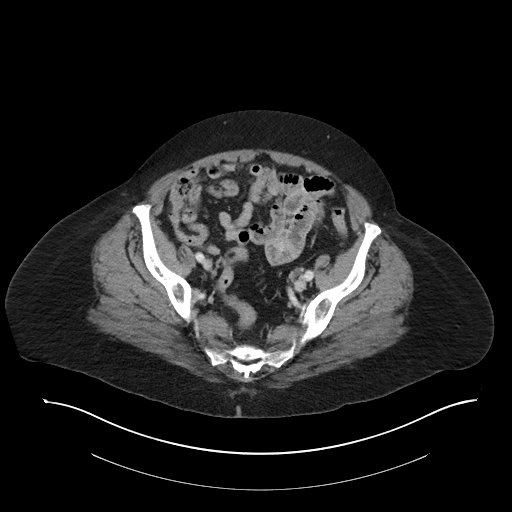
[im 32/92  soft-tissue]
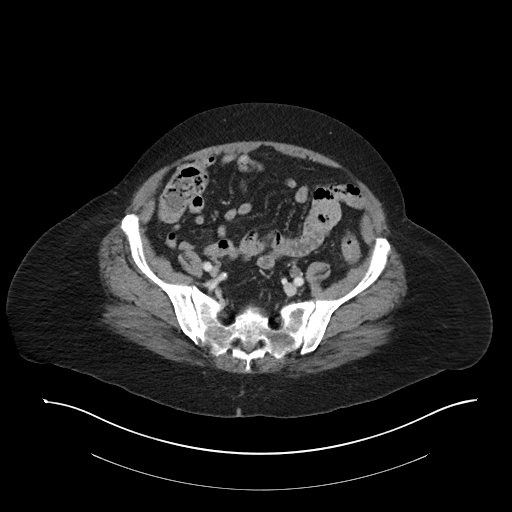
[im 41/92  soft-tissue]
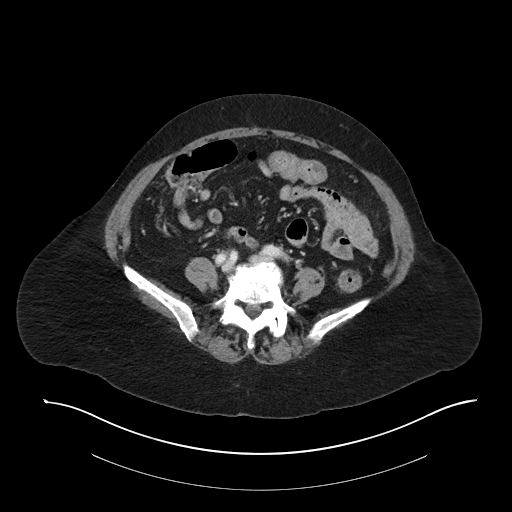
[im 46/92  soft-tissue]
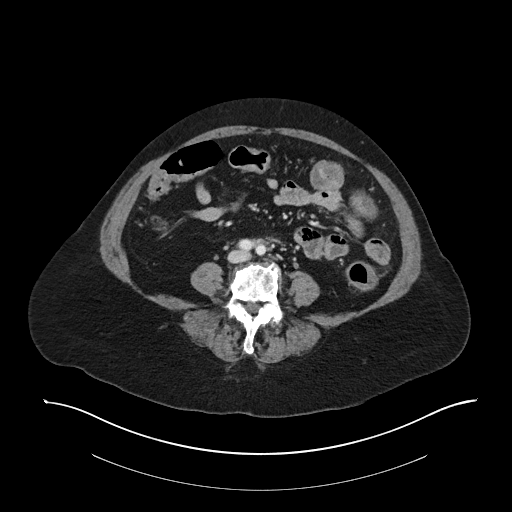
[im 51/92  soft-tissue]
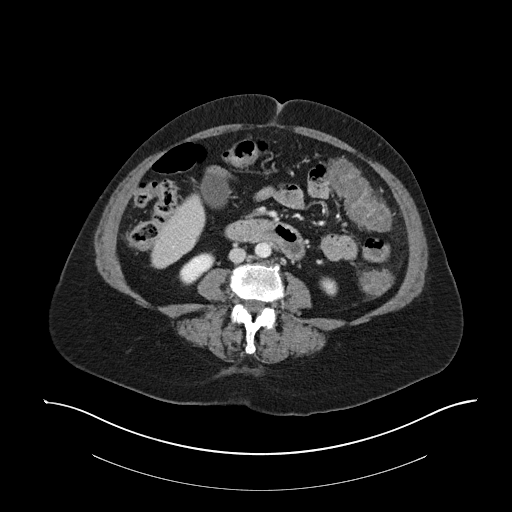
[im 60/92  soft-tissue]
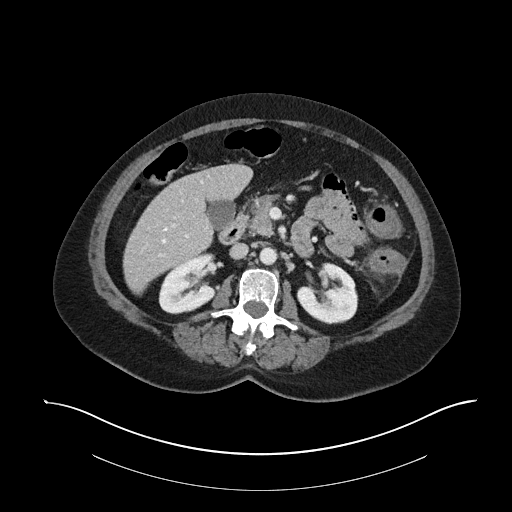
[im 60/92  bone]
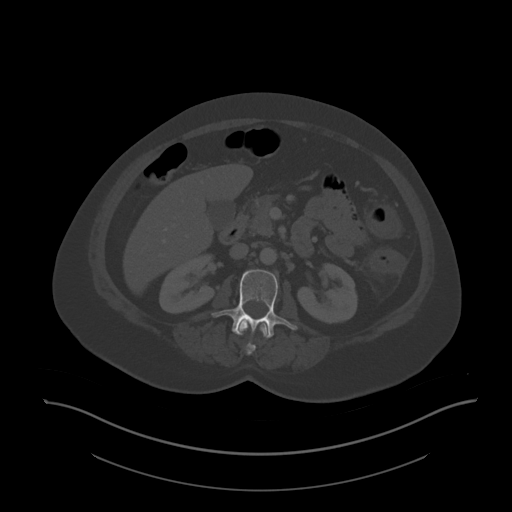
[im 64/92  soft-tissue]
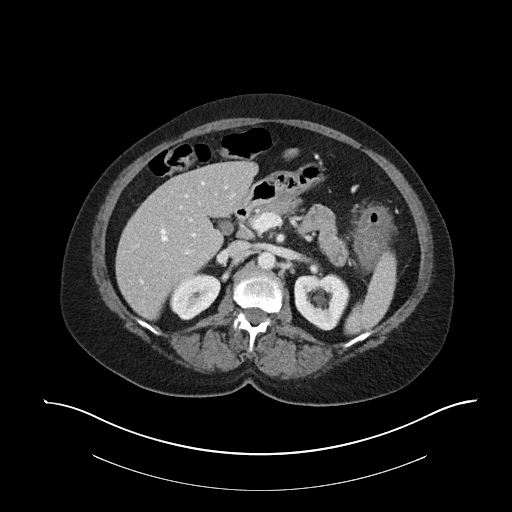
[im 73/92  soft-tissue]
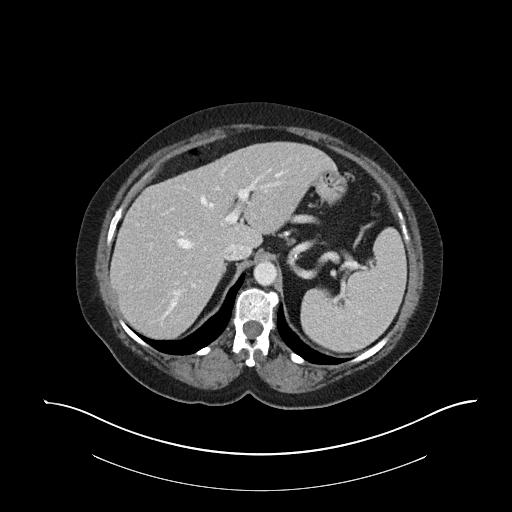
[im 78/92  soft-tissue]
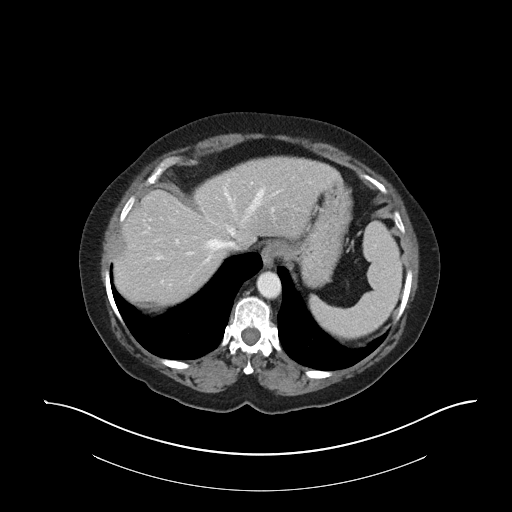
[im 87/92  soft-tissue]
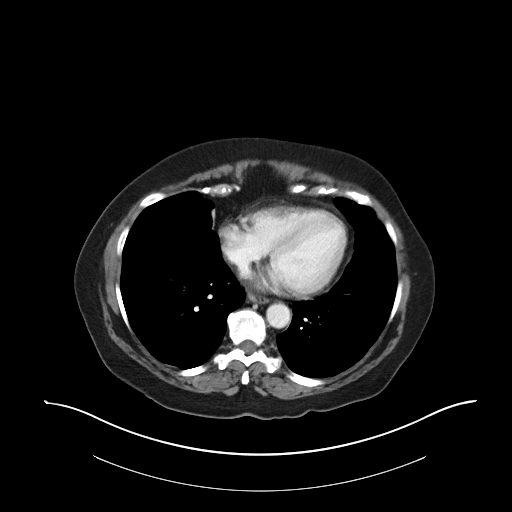

[Series 6: coronal soft tissue · coronal · 0.76mm/px · 3 of 101 slices shown]
[im 34/101  soft-tissue]
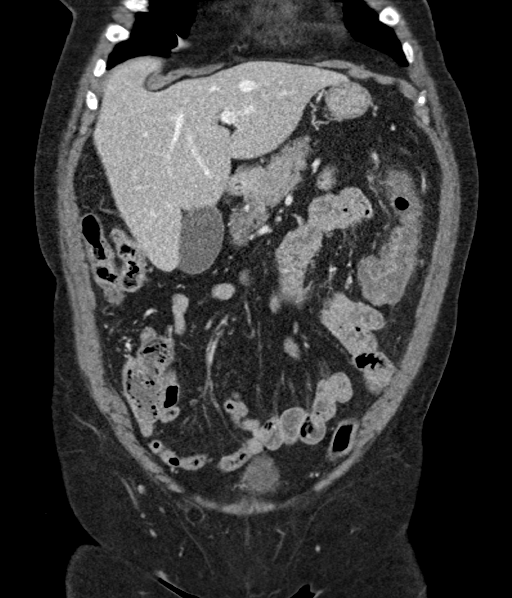
[im 45/101  soft-tissue]
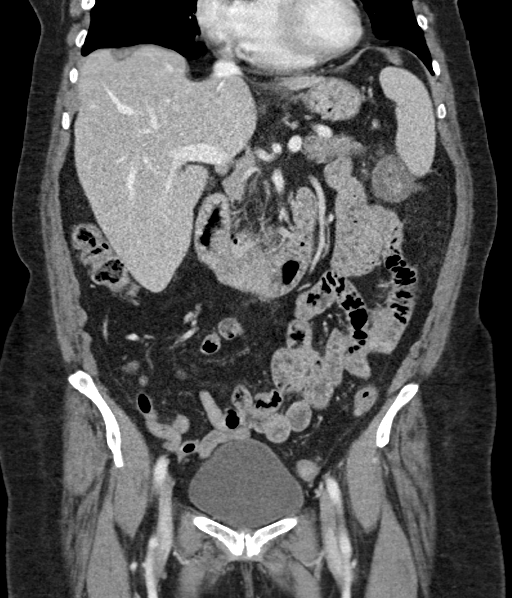
[im 56/101  soft-tissue]
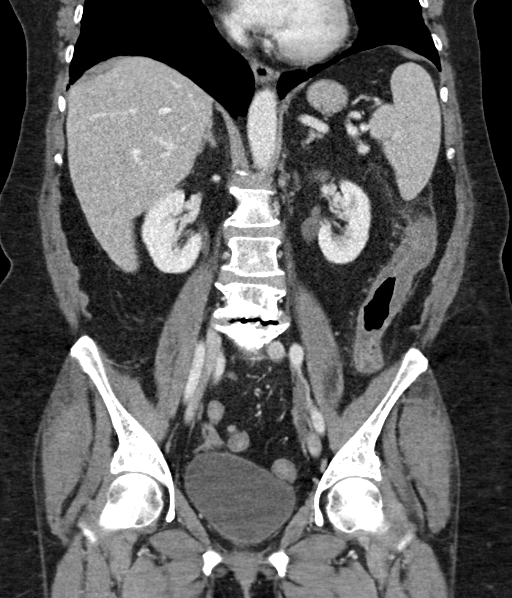

[16 of 46 positions shown; findings below may reference images not displayed]

FINDINGS: Lower chest: Lung bases are clear.

Hepatobiliary: No focal liver abnormality is seen. No gallstones,
gallbladder wall thickening, or biliary dilatation.

Pancreas: Unremarkable. No pancreatic ductal dilatation or
surrounding inflammatory changes.

Spleen: Normal in size without focal abnormality.

Adrenals/Urinary Tract: Adrenal glands are unremarkable. Kidneys are
normal, without renal calculi, focal lesion, or hydronephrosis.
Bladder is unremarkable.

Stomach/Bowel: Stomach, small bowel, and colon are not abnormally
distended. Diffuse colonic wall thickening involving the distal
transverse colon, descending colon, and sigmoid region. Mild
pericolonic fat stranding. Changes likely to represent infectious or
inflammatory colitis. Diverticulosis of the sigmoid colon without
evidence of diverticulitis. Appendix is normal.

Vascular/Lymphatic: No significant vascular findings are present. No
enlarged abdominal or pelvic lymph nodes.

Reproductive: Status post hysterectomy. No adnexal masses.

Other: No free air or free fluid in the abdomen. Abdominal wall
musculature appears intact.

Musculoskeletal: Degenerative changes in the spine.
IMPRESSION: Diffuse colonic wall thickening involving the distal transverse
colon, descending colon, and sigmoid colon. Changes likely to
represent infectious or inflammatory colitis.

## 2019-04-10 DIAGNOSIS — Z23 Encounter for immunization: Secondary | ICD-10-CM | POA: Diagnosis not present

## 2019-04-29 ENCOUNTER — Other Ambulatory Visit: Payer: Self-pay | Admitting: Internal Medicine

## 2019-04-30 NOTE — Telephone Encounter (Signed)
Last filled 03-31-19 #30 Last OV 06-29-18 Next OV 05-06-19 [facility] in Dr Alla German absence

## 2019-05-06 ENCOUNTER — Other Ambulatory Visit: Payer: Self-pay | Admitting: Internal Medicine

## 2019-05-06 ENCOUNTER — Encounter: Payer: Medicare Other | Admitting: Internal Medicine

## 2019-05-06 DIAGNOSIS — J449 Chronic obstructive pulmonary disease, unspecified: Secondary | ICD-10-CM

## 2019-05-11 DIAGNOSIS — Z79899 Other long term (current) drug therapy: Secondary | ICD-10-CM | POA: Diagnosis not present

## 2019-05-11 DIAGNOSIS — F411 Generalized anxiety disorder: Secondary | ICD-10-CM | POA: Diagnosis not present

## 2019-05-13 NOTE — Progress Notes (Signed)
Virtual Visit via Telephone Note  I connected with Kirsten Harper on 05/27/19 at  3:30 PM EST by telephone and verified that I am speaking with the correct person using two identifiers.  Location: Patient: Home  Provider: Clinic  This service was conducted via virtual visit.   The patient was located at home. I was located in my office.  Consent was obtained prior to the virtual visit and is aware of possible charges through their insurance for this visit.  The patient is an established patient.  Dr. Estanislado Pandy, MD conducted the virtual visit and Hazel Sams, PA-C acted as scribe during the service.  Office staff helped with scheduling follow up visits after the service was conducted.    I discussed the limitations, risks, security and privacy concerns of performing an evaluation and management service by telephone and the availability of in person appointments. I also discussed with the patient that there may be a patient responsible charge related to this service. The patient expressed understanding and agreed to proceed.  CC: Discuss restarting on Humira  History of Present Illness: Patient is a 73 year old female with a past medical history of ankylosing spondylitis, uveitis, and osteoarthritis. She is followed by Dr. Manuella Ghazi.  She has been holding Humira due to the concern for covid-19.  She has been holding Humira for 2-3 months, but she was advised by Dr. Manuella Ghazi to restart Humira.  She is due to update lab work and is planning on coming in for lab work tomorrow.   She states 1 month ago she fell and landed on both knees, which caused increased discomfort bilaterally.  She states she had open wounds, which have healed.  She had cortisone injections in both knee joints on 07/20/18 which provided significant relief.   Review of Systems  Constitutional: Negative for fever and malaise/fatigue.  Eyes: Negative for photophobia, pain, discharge and redness.  Respiratory: Negative for cough, shortness of  breath and wheezing.   Cardiovascular: Negative for chest pain and palpitations.  Gastrointestinal: Negative for blood in stool, constipation and diarrhea.  Genitourinary: Negative for dysuria.  Musculoskeletal: Positive for back pain. Negative for joint pain, myalgias and neck pain.       +Morning stiffness   Skin: Negative for rash.  Neurological: Negative for dizziness and headaches.  Psychiatric/Behavioral: Negative for depression. The patient is not nervous/anxious and does not have insomnia.       Observations/Objective: Physical Exam  Constitutional: She is oriented to person, place, and time.  Neurological: She is alert and oriented to person, place, and time.  Psychiatric: Mood, memory, affect and judgment normal.   Patient reports morning stiffness for 10 minutes.   Patient denies nocturnal pain.  Difficulty dressing/grooming: Denies Difficulty climbing stairs: Denies Difficulty getting out of chair: Denies Difficulty using hands for taps, buttons, cutlery, and/or writing: Denies   Assessment and Plan: Visit Diagnoses: Ankylosing spondylitis of lumbosacral region ([place]) - Severe disease-She has not had any recent ankylosing spondylitis flares.  She continues to have chronic lower back pain and stiffness but has not noticed any worsening discomfort.  She denies any joint swelling at this time.She is not having any nocturnal pain.  She has not had any recent uveitis flares.  She has been holding Humira for the past 2 to 3 months due to the concern for COVID-19.  Dr. Manuella Ghazi recommended restarting on Humira to prevent a uveitis flare.  She did not have any active inflammation on ocular exam on 05/18/2019.  She will obtain a CBC and CMP tomorrow and once her labs are resulted she plans on restarting on Humira.  She was advised to notify us if she develops increased joint pain or joint swelling.  She will have a virtual visit in 3 months.  High risk medication use:  She is prescribed  Humira 40 mg every 14 days. CBC and CMP were drawn on 12/24/18.  TB gold negative on 12/24/18. She is due to update lab work.  She is coming to the office tomorrow to have CBC and CMP drawn.    HLA B 27 Positive    Uveitis - She has not had any recent uveitis flares.  She was evaluated by Dr. Manuella Ghazi on 05/18/2019 and according to his note there is no sign of active inflammation on ocular exam.  He did recommend restarting on Humira 40 mg subcutaneous injections every 2 weeks since she has been holding it for the past 2 to 3 months due to the concern for COVID-19.Marland Kitchen  She is planning to restart once she obtains lab work tomorrow.  She has noticed some worsening of her vision which Dr. Manuella Ghazi attributes to macular pucker which will be observed at this time.  Primary osteoarthritis of both hands -She is not having any hand pain or joint swelling currently.   Primary osteoarthritis of both knees: She had cortisone injections in both knee joints on 07/20/18. She fell on both knee joints about 1 month ago, which caused increased discomfort.  She states the discomfort has started to improve.  She is not having any joint swelling.   Other medical conditions are listed as follows:   History of hypertension   History of COPD   History of anxiety   Former smoker   Follow Up Instructions: She will schedule a virtual visit in 3 months.    I discussed the assessment and treatment plan with the patient. The patient was provided an opportunity to ask questions and all were answered. The patient agreed with the plan and demonstrated an understanding of the instructions.   The patient was advised to call back or seek an in-person evaluation if the symptoms worsen or if the condition fails to improve as anticipated.  I provided 25 minutes of non-face-to-face time during this encounter.   Bo Merino, MD   Scribed by-  Hazel Sams, PA-C

## 2019-05-18 DIAGNOSIS — H209 Unspecified iridocyclitis: Secondary | ICD-10-CM | POA: Diagnosis not present

## 2019-05-18 DIAGNOSIS — H35033 Hypertensive retinopathy, bilateral: Secondary | ICD-10-CM | POA: Diagnosis not present

## 2019-05-18 DIAGNOSIS — H0233 Blepharochalasis right eye, unspecified eyelid: Secondary | ICD-10-CM | POA: Diagnosis not present

## 2019-05-18 DIAGNOSIS — E113293 Type 2 diabetes mellitus with mild nonproliferative diabetic retinopathy without macular edema, bilateral: Secondary | ICD-10-CM | POA: Diagnosis not present

## 2019-05-18 DIAGNOSIS — H35371 Puckering of macula, right eye: Secondary | ICD-10-CM | POA: Diagnosis not present

## 2019-05-18 DIAGNOSIS — Z961 Presence of intraocular lens: Secondary | ICD-10-CM | POA: Diagnosis not present

## 2019-05-18 DIAGNOSIS — M450 Ankylosing spondylitis of multiple sites in spine: Secondary | ICD-10-CM | POA: Diagnosis not present

## 2019-05-18 LAB — HM DIABETES EYE EXAM

## 2019-05-27 ENCOUNTER — Telehealth (INDEPENDENT_AMBULATORY_CARE_PROVIDER_SITE_OTHER): Payer: Medicare Other | Admitting: Rheumatology

## 2019-05-27 ENCOUNTER — Encounter: Payer: Self-pay | Admitting: Rheumatology

## 2019-05-27 ENCOUNTER — Other Ambulatory Visit: Payer: Self-pay

## 2019-05-27 DIAGNOSIS — Z8659 Personal history of other mental and behavioral disorders: Secondary | ICD-10-CM | POA: Diagnosis not present

## 2019-05-27 DIAGNOSIS — M170 Bilateral primary osteoarthritis of knee: Secondary | ICD-10-CM

## 2019-05-27 DIAGNOSIS — Z8709 Personal history of other diseases of the respiratory system: Secondary | ICD-10-CM | POA: Diagnosis not present

## 2019-05-27 DIAGNOSIS — M7022 Olecranon bursitis, left elbow: Secondary | ICD-10-CM | POA: Diagnosis not present

## 2019-05-27 DIAGNOSIS — Z87891 Personal history of nicotine dependence: Secondary | ICD-10-CM | POA: Diagnosis not present

## 2019-05-27 DIAGNOSIS — M19041 Primary osteoarthritis, right hand: Secondary | ICD-10-CM | POA: Diagnosis not present

## 2019-05-27 DIAGNOSIS — Z79899 Other long term (current) drug therapy: Secondary | ICD-10-CM

## 2019-05-27 DIAGNOSIS — H209 Unspecified iridocyclitis: Secondary | ICD-10-CM

## 2019-05-27 DIAGNOSIS — M457 Ankylosing spondylitis of lumbosacral region: Secondary | ICD-10-CM | POA: Diagnosis not present

## 2019-05-27 DIAGNOSIS — Z8679 Personal history of other diseases of the circulatory system: Secondary | ICD-10-CM

## 2019-05-27 DIAGNOSIS — M19042 Primary osteoarthritis, left hand: Secondary | ICD-10-CM

## 2019-05-27 DIAGNOSIS — M47819 Spondylosis without myelopathy or radiculopathy, site unspecified: Secondary | ICD-10-CM

## 2019-05-28 ENCOUNTER — Other Ambulatory Visit: Payer: Self-pay

## 2019-05-28 ENCOUNTER — Other Ambulatory Visit: Payer: Self-pay | Admitting: Internal Medicine

## 2019-05-28 DIAGNOSIS — Z79899 Other long term (current) drug therapy: Secondary | ICD-10-CM

## 2019-05-29 LAB — COMPLETE METABOLIC PANEL WITH GFR
AG Ratio: 1.6 (calc) (ref 1.0–2.5)
ALT: 19 U/L (ref 6–29)
AST: 17 U/L (ref 10–35)
Albumin: 4.2 g/dL (ref 3.6–5.1)
Alkaline phosphatase (APISO): 80 U/L (ref 37–153)
BUN: 13 mg/dL (ref 7–25)
CO2: 26 mmol/L (ref 20–32)
Calcium: 9.1 mg/dL (ref 8.6–10.4)
Chloride: 99 mmol/L (ref 98–110)
Creat: 0.86 mg/dL (ref 0.60–0.93)
GFR, Est African American: 79 mL/min/{1.73_m2} (ref >=60)
GFR, Est Non African American: 68 mL/min/{1.73_m2} (ref >=60)
Globulin: 2.7 g/dL (calc) (ref 1.9–3.7)
Glucose, Bld: 102 mg/dL — ABNORMAL HIGH (ref 65–99)
Potassium: 4 mmol/L (ref 3.5–5.3)
Sodium: 134 mmol/L — ABNORMAL LOW (ref 135–146)
Total Bilirubin: 1.1 mg/dL (ref 0.2–1.2)
Total Protein: 6.9 g/dL (ref 6.1–8.1)

## 2019-05-29 LAB — CBC WITH DIFFERENTIAL/PLATELET
Absolute Monocytes: 576 cells/uL (ref 200–950)
Basophils Absolute: 32 cells/uL (ref 0–200)
Basophils Relative: 0.5 %
Eosinophils Absolute: 333 cells/uL (ref 15–500)
Eosinophils Relative: 5.2 %
HCT: 42.5 % (ref 35.0–45.0)
Hemoglobin: 14.7 g/dL (ref 11.7–15.5)
Lymphs Abs: 1728 cells/uL (ref 850–3900)
MCH: 32.3 pg (ref 27.0–33.0)
MCHC: 34.6 g/dL (ref 32.0–36.0)
MCV: 93.4 fL (ref 80.0–100.0)
MPV: 10.2 fL (ref 7.5–12.5)
Monocytes Relative: 9 %
Neutro Abs: 3731 cells/uL (ref 1500–7800)
Neutrophils Relative %: 58.3 %
Platelets: 166 10*3/uL (ref 140–400)
RBC: 4.55 10*6/uL (ref 3.80–5.10)
RDW: 12.2 % (ref 11.0–15.0)
Total Lymphocyte: 27 %
WBC: 6.4 10*3/uL (ref 3.8–10.8)

## 2019-05-31 NOTE — Telephone Encounter (Signed)
Klonopin Last filled 04-30-19 #30 Last OV 06-29-18 Next OV none [facility]

## 2019-06-02 DIAGNOSIS — Z79899 Other long term (current) drug therapy: Secondary | ICD-10-CM | POA: Diagnosis not present

## 2019-06-02 DIAGNOSIS — F411 Generalized anxiety disorder: Secondary | ICD-10-CM | POA: Diagnosis not present

## 2019-06-24 ENCOUNTER — Telehealth: Payer: Self-pay | Admitting: Rheumatology

## 2019-06-24 ENCOUNTER — Encounter: Payer: Self-pay | Admitting: Internal Medicine

## 2019-06-24 DIAGNOSIS — M457 Ankylosing spondylitis of lumbosacral region: Secondary | ICD-10-CM

## 2019-06-24 MED ORDER — HUMIRA (2 PEN) 40 MG/0.4ML ~~LOC~~ AJKT: 40.0000 mg | AUTO-INJECTOR | SUBCUTANEOUS | 0 refills | Status: DC

## 2019-06-24 NOTE — Telephone Encounter (Signed)
Patient needs help filling out Patient Assistance forms for Humira. Patient request a call back to help with form.

## 2019-06-24 NOTE — Telephone Encounter (Signed)
Patient needs refill on Humira sent to [facility]. Patient would like to get a three month supply if possible. Please call to advise.

## 2019-06-24 NOTE — Telephone Encounter (Addendum)
Last Visit: 05/27/2019 telemedicine  Next Visit: 08/26/2019 Labs: 05/28/2019 CBC and CMP WNL. TB Gold: 12/24/2018 negative   Okay to refill per Dr. Estanislado Pandy.   Advised patient that refill has been sent.

## 2019-06-24 NOTE — Telephone Encounter (Signed)
Returned patient's call, patient spoke to [place] and answered PAP renewal questions over the phone. Says she needs a Humira refill for her last fill of 2020. Advised patient, message has already been send to the clinic staff. Patient had no further questions or concerns.

## 2019-06-28 ENCOUNTER — Other Ambulatory Visit: Payer: Self-pay | Admitting: Internal Medicine

## 2019-06-28 NOTE — Telephone Encounter (Signed)
Last filled 05-31-19 #30 Last OV 08-03-18 Next OV 11-16-19 [facility]

## 2019-07-03 ENCOUNTER — Other Ambulatory Visit: Payer: Self-pay | Admitting: Internal Medicine

## 2019-07-25 ENCOUNTER — Other Ambulatory Visit: Payer: Self-pay | Admitting: Internal Medicine

## 2019-07-26 NOTE — Telephone Encounter (Signed)
Last filled 06-28-19 #30 Last OV 08-03-18 Next OV 11-16-19 [facility]

## 2019-08-03 ENCOUNTER — Telehealth: Payer: Self-pay | Admitting: Rheumatology

## 2019-08-03 NOTE — Telephone Encounter (Signed)
Patient left a voicemail stating she fell on Saturday, 07/31/19 and her back is hurting.  Patient is requesting a return call.

## 2019-08-03 NOTE — Telephone Encounter (Signed)
Attempted to contact the patient and left message for patient to call the office.  

## 2019-08-06 MED ORDER — METHOCARBAMOL 500 MG PO TABS: 500.0000 mg | ORAL_TABLET | Freq: Two times a day (BID) | ORAL | 0 refills | Status: DC | PRN

## 2019-08-06 NOTE — Addendum Note (Signed)
Addended by: Carole Binning on: 08/06/2019 04:15 PM   Modules accepted: Orders

## 2019-08-06 NOTE — Telephone Encounter (Signed)
Patient returned call to the office. Patient states last weekend when there was some sleet. Patient states she fell and hit the ground. Patient states her back is still bothering her from the fall. Patient states the pain is getting better and has declined an appointment at this time. Patient will call the office if her symptoms do not improve. Patient is requesting a muscle relaxer. Per Hazel Sams PA-C okay to send in Robaxin 500 mg 1 tablet po BID PRN #[id number] with no refills.

## 2019-08-16 ENCOUNTER — Other Ambulatory Visit: Payer: Self-pay | Admitting: Rheumatology

## 2019-08-17 ENCOUNTER — Other Ambulatory Visit: Payer: Self-pay | Admitting: Rheumatology

## 2019-08-18 ENCOUNTER — Other Ambulatory Visit: Payer: Self-pay | Admitting: Internal Medicine

## 2019-08-19 ENCOUNTER — Other Ambulatory Visit: Payer: Self-pay | Admitting: Internal Medicine

## 2019-08-22 ENCOUNTER — Other Ambulatory Visit: Payer: Self-pay | Admitting: Internal Medicine

## 2019-08-23 NOTE — Progress Notes (Deleted)
Virtual Visit via Telephone Note  I connected with Kirsten Harper on 08/23/19 at  3:45 PM EST by telephone and verified that I am speaking with the correct person using two identifiers.  Location: Patient: *** Provider: ***   I discussed the limitations, risks, security and privacy concerns of performing an evaluation and management service by telephone and the availability of in person appointments. I also discussed with the patient that there may be a patient responsible charge related to this service. The patient expressed understanding and agreed to proceed.  CC: History of Present Illness: Patient is a 74 year old female with a past medical history of   Review of Systems  Constitutional: Negative for fever and malaise/fatigue.  Eyes: Negative for photophobia, pain, discharge and redness.  Respiratory: Negative for cough, shortness of breath and wheezing.   Cardiovascular: Negative for chest pain and palpitations.  Gastrointestinal: Negative for blood in stool, constipation and diarrhea.  Genitourinary: Negative for dysuria.  Musculoskeletal: Negative for back pain, joint pain, myalgias and neck pain.  Skin: Negative for rash.  Neurological: Negative for dizziness and headaches.  Psychiatric/Behavioral: Negative for depression. The patient is not nervous/anxious and does not have insomnia.       Observations/Objective: Physical Exam  Constitutional: She is oriented to person, place, and time.  Neurological: She is alert and oriented to person, place, and time.  Psychiatric: Mood, memory, affect and judgment normal.    Patient reports morning stiffness for *** {minute/hour:19697}.   Patient {Actions; denies-reports:120008} nocturnal pain.  Difficulty dressing/grooming: {ACTIONS;DENIES/REPORTS:21021675::"Denies"} Difficulty climbing stairs: {ACTIONS;DENIES/REPORTS:21021675::"Denies"} Difficulty getting out of chair: {ACTIONS;DENIES/REPORTS:21021675::"Denies"} Difficulty using  hands for taps, buttons, cutlery, and/or writing: {ACTIONS;DENIES/REPORTS:21021675::"Denies"}  Assessment and Plan:  Visit Diagnoses:Ankylosing spondylitis of lumbosacral region [facility])- Severe disease-  High risk medication IDP:OEUMP prescribedHumira 40 mg every 14 days.   HLA B 27 Positive   Uveitis -She has not had any recent uveitis flares.  She was evaluated by Dr. Manuella Ghazi on 05/18/2019 and according to his note there is no sign of active inflammation on ocular exam.    Primary osteoarthritis of both hands -She is not having any hand pain or joint swelling currently.   Primary osteoarthritis of both knees: She had cortisone injections in both knee joints on 07/20/18.   Other medical conditions are listed as follows:  History of hypertension   History of COPD   History of anxiety   Former smoker Follow Up Instructions: She will follow up in    I discussed the assessment and treatment plan with the patient. The patient was provided an opportunity to ask questions and all were answered. The patient agreed with the plan and demonstrated an understanding of the instructions.   The patient was advised to call back or seek an in-person evaluation if the symptoms worsen or if the condition fails to improve as anticipated.  I provided *** minutes of non-face-to-face time during this encounter.   Ofilia Neas, PA-C

## 2019-08-24 ENCOUNTER — Other Ambulatory Visit: Payer: Self-pay | Admitting: Rheumatology

## 2019-08-24 ENCOUNTER — Other Ambulatory Visit: Payer: Self-pay | Admitting: *Deleted

## 2019-08-24 ENCOUNTER — Other Ambulatory Visit: Payer: Self-pay | Admitting: Internal Medicine

## 2019-08-24 DIAGNOSIS — J449 Chronic obstructive pulmonary disease, unspecified: Secondary | ICD-10-CM

## 2019-08-24 MED ORDER — CLONAZEPAM 0.5 MG PO TABS: ORAL_TABLET | ORAL | 0 refills | Status: DC

## 2019-08-24 NOTE — Telephone Encounter (Signed)
Patient left a voicemail stating that she needs a refill on her Clonazepam. Patient stated that the script shows three times a day as needed, but the most she takes is twice a day. Last refill 07/26/19 #30 Last office visit 08/03/18 No upcoming appointment scheduled Pharmacy [facility]

## 2019-08-24 NOTE — Telephone Encounter (Signed)
Last Visit: 05/27/2019 telemedicine  Next Visit: 08/26/2019  Okay to refill per Dr. Estanislado Pandy

## 2019-08-26 ENCOUNTER — Other Ambulatory Visit: Payer: Self-pay

## 2019-08-26 ENCOUNTER — Encounter: Payer: Self-pay | Admitting: Rheumatology

## 2019-08-26 ENCOUNTER — Ambulatory Visit (INDEPENDENT_AMBULATORY_CARE_PROVIDER_SITE_OTHER): Payer: Medicare Other

## 2019-08-26 ENCOUNTER — Telehealth: Payer: Self-pay | Admitting: *Deleted

## 2019-08-26 ENCOUNTER — Ambulatory Visit (INDEPENDENT_AMBULATORY_CARE_PROVIDER_SITE_OTHER): Payer: Medicare Other | Admitting: Rheumatology

## 2019-08-26 VITALS — BP 157/99 | HR 97 | Resp 14 | Ht 67.5 in | Wt 206.0 lb

## 2019-08-26 DIAGNOSIS — Z8659 Personal history of other mental and behavioral disorders: Secondary | ICD-10-CM

## 2019-08-26 DIAGNOSIS — M19042 Primary osteoarthritis, left hand: Secondary | ICD-10-CM | POA: Diagnosis not present

## 2019-08-26 DIAGNOSIS — Z8679 Personal history of other diseases of the circulatory system: Secondary | ICD-10-CM | POA: Diagnosis not present

## 2019-08-26 DIAGNOSIS — G8929 Other chronic pain: Secondary | ICD-10-CM

## 2019-08-26 DIAGNOSIS — Z8709 Personal history of other diseases of the respiratory system: Secondary | ICD-10-CM

## 2019-08-26 DIAGNOSIS — Z87891 Personal history of nicotine dependence: Secondary | ICD-10-CM

## 2019-08-26 DIAGNOSIS — Z79899 Other long term (current) drug therapy: Secondary | ICD-10-CM | POA: Diagnosis not present

## 2019-08-26 DIAGNOSIS — M170 Bilateral primary osteoarthritis of knee: Secondary | ICD-10-CM | POA: Diagnosis not present

## 2019-08-26 DIAGNOSIS — M47819 Spondylosis without myelopathy or radiculopathy, site unspecified: Secondary | ICD-10-CM

## 2019-08-26 DIAGNOSIS — M545 Low back pain, unspecified: Secondary | ICD-10-CM

## 2019-08-26 DIAGNOSIS — H209 Unspecified iridocyclitis: Secondary | ICD-10-CM | POA: Diagnosis not present

## 2019-08-26 DIAGNOSIS — M457 Ankylosing spondylitis of lumbosacral region: Secondary | ICD-10-CM

## 2019-08-26 DIAGNOSIS — M19041 Primary osteoarthritis, right hand: Secondary | ICD-10-CM | POA: Diagnosis not present

## 2019-08-26 LAB — COMPLETE METABOLIC PANEL WITH GFR
AG Ratio: 1.7 (calc) (ref 1.0–2.5)
ALT: 21 U/L (ref 6–29)
AST: 19 U/L (ref 10–35)
Albumin: 4.4 g/dL (ref 3.6–5.1)
Alkaline phosphatase (APISO): 98 U/L (ref 37–153)
BUN: 13 mg/dL (ref 7–25)
CO2: 29 mmol/L (ref 20–32)
Calcium: 9.4 mg/dL (ref 8.6–10.4)
Chloride: 102 mmol/L (ref 98–110)
Creat: 0.85 mg/dL (ref 0.60–0.93)
GFR, Est African American: 80 mL/min/{1.73_m2} (ref >=60)
GFR, Est Non African American: 69 mL/min/{1.73_m2} (ref >=60)
Globulin: 2.6 g/dL (calc) (ref 1.9–3.7)
Glucose, Bld: 112 mg/dL — ABNORMAL HIGH (ref 65–99)
Potassium: 4.1 mmol/L (ref 3.5–5.3)
Sodium: 137 mmol/L (ref 135–146)
Total Bilirubin: 0.9 mg/dL (ref 0.2–1.2)
Total Protein: 7 g/dL (ref 6.1–8.1)

## 2019-08-26 LAB — CBC WITH DIFFERENTIAL/PLATELET
Absolute Monocytes: 663 cells/uL (ref 200–950)
Basophils Absolute: 52 cells/uL (ref 0–200)
Basophils Relative: 0.8 %
Eosinophils Absolute: 371 cells/uL (ref 15–500)
Eosinophils Relative: 5.7 %
HCT: 43.6 % (ref 35.0–45.0)
Hemoglobin: 15 g/dL (ref 11.7–15.5)
Lymphs Abs: 1690 cells/uL (ref 850–3900)
MCH: 32.3 pg (ref 27.0–33.0)
MCHC: 34.4 g/dL (ref 32.0–36.0)
MCV: 94 fL (ref 80.0–100.0)
MPV: 10 fL (ref 7.5–12.5)
Monocytes Relative: 10.2 %
Neutro Abs: 3725 cells/uL (ref 1500–7800)
Neutrophils Relative %: 57.3 %
Platelets: 174 10*3/uL (ref 140–400)
RBC: 4.64 10*6/uL (ref 3.80–5.10)
RDW: 12.3 % (ref 11.0–15.0)
Total Lymphocyte: 26 %
WBC: 6.5 10*3/uL (ref 3.8–10.8)

## 2019-08-26 NOTE — Progress Notes (Signed)
Office Visit Note  Patient: Kirsten Harper             Date of Birth: 1945-08-16           MRN: [id number]             PCP: Venia Carbon, MD Referring: Venia Carbon, MD Visit Date: 08/26/2019 Occupation: '@GUAROCC'$ @  Subjective:  Lower back pain.   History of Present Illness: Kirsten Harper is a 74 y.o. female with history of ankylosing spondylitis.  She states that about 3 to 4 weeks ago she tripped in her garden and fell.  She states she had been having a lot of lower back pain since then.  She has been taking muscle relaxers which helped to some extent.  She is concerned that she may have fractured something.  None of the other joints are painful.  She states she was having some knee joint discomfort after the fall which is getting better.  She denies any joint swelling.  Activities of Daily Living:  Patient reports morning stiffness for 24 hours.   Patient Reports nocturnal pain.  Difficulty dressing/grooming: Denies Difficulty climbing stairs: Reports Difficulty getting out of chair: Reports Difficulty using hands for taps, buttons, cutlery, and/or writing: Denies  Review of Systems  Constitutional: Positive for fatigue. Negative for night sweats, weight gain and weight loss.  HENT: Positive for mouth dryness. Negative for mouth sores, trouble swallowing, trouble swallowing and nose dryness.   Eyes: Negative for pain, redness, itching, visual disturbance and dryness.  Respiratory: Negative for cough, shortness of breath and difficulty breathing.   Cardiovascular: Negative for chest pain, palpitations, hypertension, irregular heartbeat and swelling in legs/feet.  Gastrointestinal: Negative for blood in stool, constipation and diarrhea.  Endocrine: Negative for increased urination.  Genitourinary: Negative for difficulty urinating, painful urination and vaginal dryness.  Musculoskeletal: Positive for arthralgias, joint pain and morning stiffness. Negative for joint  swelling, myalgias, muscle weakness, muscle tenderness and myalgias.  Skin: Negative for color change, rash, hair loss, skin tightness, ulcers and sensitivity to sunlight.  Allergic/Immunologic: Negative for susceptible to infections.  Neurological: Negative for dizziness, headaches, memory loss, night sweats and weakness.  Hematological: Negative for bruising/bleeding tendency and swollen glands.  Psychiatric/Behavioral: Negative for depressed mood, confusion and sleep disturbance. The patient is not nervous/anxious.     PMFS History:  Patient Active Problem List   Diagnosis Date Noted  . Colitis 07/28/2018  . Preventative health care 05/04/2018  . Advance directive discussed with patient 05/04/2018  . RLS (restless legs syndrome) 04/25/2017  . Mood disorder ([place]) 04/08/2017  . Sensory loss 04/08/2017  . COPD (chronic obstructive pulmonary disease) ([place]) 04/08/2017  . Ankylosing spondylitis of lumbosacral region ([place]) 09/24/2016  . GAD (generalized anxiety disorder) 08/27/2016  . 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 Spondyloarthropathy HLA-B27 positive 06/06/2016  . Iritis 06/06/2016  . High risk medication use 06/06/2016  . Primary osteoarthritis of both hands 06/06/2016  . Smoker 06/06/2016  . COPD exacerbation ([place]) 04/10/2016  . Anxiety 04/10/2016  . Essential hypertension     Past Medical History:  Diagnosis Date  . Ankylosing spondylitis ([place])   . Anxiety   . Arthritis   . Chronic lower back pain   . CKD (chronic kidney disease), stage II    "related to the Ankylosing spondylitis" (04/10/2016)  . COPD (chronic obstructive pulmonary disease) ([place])   . Eye disease   . Hypertension   . PONV (postoperative nausea and vomiting)    "  didn't bother me the last time I had it in ~ 2015"    Family History  Problem Relation Age of Onset  . Hypertension Mother   . Heart disease Sister   . Diabetes Sister   . Pulmonary fibrosis Brother   .  Idiopathic pulmonary fibrosis Brother   . Alzheimer's disease Brother   . Dementia Brother    Past Surgical History:  Procedure Laterality Date  . BROW LIFT AND BLEPHAROPLASTY Bilateral ~ 2015  . CATARACT EXTRACTION W/ INTRAOCULAR LENS  IMPLANT, BILATERAL Bilateral 2000s  . CYST REMOVAL TRUNK  12/2018  . MANDIBLE SURGERY  ~ 2000   "jaw had moved to left; broke my jaw in 5 places; put titanium in; stripped muscle from bone; wired jaw shut"  . TUBAL LIGATION  1979  . VAGINAL HYSTERECTOMY  1985   Social History   Social History Narrative   2 sons--not in area   Lives on property with her mother [name]      Has living will   Would want one of her sons, [name], to be Media planner   Requests DNR   No tube feeds   Immunization History  Administered Date(s) Administered  . Influenza Inj Mdck Quad Pf 04/16/2018  . Influenza-Unspecified 04/09/2019  . Pneumococcal Conjugate-13 05/10/2013  . Pneumococcal Polysaccharide-23 09/20/2014  . Tdap 04/30/2011  . Zoster Recombinat (Shingrix) 10/01/2016     Objective: Vital Signs: BP (!) 157/99 (BP Location: Left Arm, Patient Position: Sitting, Cuff Size: Normal)   Pulse 97   Resp 14   Ht 5' 7.5" (1.715 m)   Wt 206 lb (93.4 kg)   BMI 31.79 kg/m    Physical Exam Vitals and nursing note reviewed.  Constitutional:      Appearance: She is well-developed.  HENT:     Head: Normocephalic and atraumatic.  Eyes:     Conjunctiva/sclera: Conjunctivae normal.  Cardiovascular:     Rate and Rhythm: Normal rate and regular rhythm.     Heart sounds: Normal heart sounds.  Pulmonary:     Effort: Pulmonary effort is normal.     Breath sounds: Normal breath sounds.  Abdominal:     General: Bowel sounds are normal.     Palpations: Abdomen is soft.  Musculoskeletal:     Cervical back: Normal range of motion.  Lymphadenopathy:     Cervical: No cervical adenopathy.  Skin:    General: Skin is warm and dry.     Capillary Refill: Capillary  refill takes less than 2 seconds.  Neurological:     Mental Status: She is alert and oriented to person, place, and time.  Psychiatric:        Behavior: Behavior normal.      Musculoskeletal Exam: C-spine was in good range of motion.  She has limited range of motion of her thoracic and lumbar spine.  She had no SI joint tenderness.  No point tenderness was elicited over the lumbar spine.  Shoulder joints, elbow joints, wrist joints, MCPs PIPs and DIPs with good range of motion.  She has DIP and PIP thickening consistent with osteoarthritis.  Hip joints, knee joints, ankles with good range of motion with no synovitis.  CDAI Exam: CDAI Score: -- Patient Global: --; Provider Global: -- Swollen: --; Tender: -- Joint Exam 08/26/2019   No joint exam has been documented for this visit   There is currently no information documented on the homunculus. Go to the Rheumatology activity and complete the homunculus joint exam.  Investigation: No additional findings.  Imaging: XR Lumbar Spine 2-3 Views  Result Date: 08/26/2019 Multilevel spondylosis with anterior osteophytes was noted.  Severe narrowing between L3-4 and L4-5 was noted.  L4-5 spondylolisthesis was noted.  Severe facet joint arthropathy was noted.  No syndesmophytes were noted. Impression: These findings are consistent with severe degenerative disc disease and facet joint arthropathy.   Recent Labs: Lab Results  Component Value Date   WBC 6.4 05/28/2019   HGB 14.7 05/28/2019   PLT 166 05/28/2019   NA 134 (L) 05/28/2019   K 4.0 05/28/2019   CL 99 05/28/2019   CO2 26 05/28/2019   GLUCOSE 102 (H) 05/28/2019   BUN 13 05/28/2019   CREATININE 0.86 05/28/2019   BILITOT 1.1 05/28/2019   ALKPHOS 98 07/27/2018   AST 17 05/28/2019   ALT 19 05/28/2019   PROT 6.9 05/28/2019   ALBUMIN 4.3 08/03/2018   CALCIUM 9.1 05/28/2019   GFRAA 79 05/28/2019   QFTBGOLDPLUS NEGATIVE 12/24/2018    Speciality Comments: No specialty comments  available.  Procedures:  No procedures performed Allergies: Amoxapine and related, Seroquel [quetiapine], and Other   Assessment / Plan:     Visit Diagnoses: Ankylosing spondylitis of lumbosacral region (HCC)-patient continues to have some generalized stiffness since her fall.  Overall her ankylosing spondylitis is well controlled on Humira.  High risk medication use-she is on Humira 40 mg subcu every other week.  Her last labs obtained on May 28, 2019 were within normal limits.  Her TB Gold on December 24, 2018 was negative.  We will check labs today and then every 3 months to monitor for drug toxicity.  HLA B 27 Positive   Uveitis-she denies any recurrence.  Lower back pain midline without sciatica-patient had a recent fall about 3 to 4 weeks ago and since then she has been having lower back pain.  Will obtain x-ray of her lumbar spine 2 views today.  X-ray showed multilevel spondylosis and facet joint arthropathy.  No syndesmophytes were noted.  Severe narrowing between L3-4 and L4-5 was noted.  L4-5 spondylolisthesis was noted.  No vertebral fracture was noted.  She had no point tenderness on examination.  There was no radiculopathy.  I offered physical therapy which she declined.  Have given her a handout on lumbar spine exercises.  Primary osteoarthritis of both hands-she has DIP and PIP thickening without any synovitis.  Primary osteoarthritis of both knees-she is osteoarthritis in her knee joints.  She is also gained some weight lately.  Weight loss diet and exercise was discussed.  She would benefit from physical therapy to lower extremities.  Have given her a handout on knee joint exercises for right now.  History of hypertension-blood pressure was elevated today.  Have advised her to monitor blood pressure closely and contact her PCP if it stays elevated.  In the medical problems are listed as follows:  History of COPD  History of anxiety  Former smoker  Orders: Orders  Placed This Encounter  Procedures  . XR Lumbar Spine 2-3 Views  . CBC with Differential/Platelet  . COMPLETE METABOLIC PANEL WITH GFR   No orders of the defined types were placed in this encounter.   Face-to-face time spent with patient was 30 minutes. Greater than 50% of time was spent in counseling and coordination of care.  Follow-Up Instructions: Return in about 5 months (around 01/23/2020) for Ankylosing spondylitis.   Bo Merino, MD  Note - This record has been created using Editor, commissioning.  Chart creation errors have been sought, but may not always  have been located. Such creation errors do not reflect on  the standard of medical care.

## 2019-08-26 NOTE — Patient Instructions (Signed)
Journal for Nurse Practitioners, 15(4), [phone]. Retrieved April 20, 2018 from [email or web address].com/[email or web address]">  Knee Exercises Ask your health care provider which exercises are safe for you. Do exercises exactly as told by your health care provider and adjust them as directed. It is normal to feel mild stretching, pulling, tightness, or discomfort as you do these exercises. Stop right away if you feel sudden pain or your pain gets worse. Do not begin these exercises until told by your health care provider. Stretching and range-of-motion exercises These exercises warm up your muscles and joints and improve the movement and flexibility of your knee. These exercises also help to relieve pain and swelling. Knee extension, prone 1. Lie on your abdomen (prone position) on a bed. 2. Place your left / right knee just beyond the edge of the surface so your knee is not on the bed. You can put a towel under your left / right thigh just above your kneecap for comfort. 3. Relax your leg muscles and allow gravity to straighten your knee (extension). You should feel a stretch behind your left / right knee. 4. Hold this position for __________ seconds. 5. Scoot up so your knee is supported between repetitions. Repeat __________ times. Complete this exercise __________ times a day. Knee flexion, active  1. Lie on your back with both legs straight. If this causes back discomfort, bend your left / right knee so your foot is flat on the floor. 2. Slowly slide your left / right heel back toward your buttocks. Stop when you feel a gentle stretch in the front of your knee or thigh (flexion). 3. Hold this position for __________ seconds. 4. Slowly slide your left / right heel back to the starting position. Repeat __________ times. Complete this exercise __________ times a day. Quadriceps stretch, prone  1. Lie on your abdomen on a firm surface, such as a bed or padded floor. 2. Bend your left / right knee and hold  your ankle. If you cannot reach your ankle or pant leg, loop a belt around your foot and grab the belt instead. 3. Gently pull your heel toward your buttocks. Your knee should not slide out to the side. You should feel a stretch in the front of your thigh and knee (quadriceps). 4. Hold this position for __________ seconds. Repeat __________ times. Complete this exercise __________ times a day. Hamstring, supine 1. Lie on your back (supine position). 2. Loop a belt or towel over the ball of your left / right foot. The ball of your foot is on the walking surface, right under your toes. 3. Straighten your left / right knee and slowly pull on the belt to raise your leg until you feel a gentle stretch behind your knee (hamstring). ? Do not let your knee bend while you do this. ? Keep your other leg flat on the floor. 4. Hold this position for __________ seconds. Repeat __________ times. Complete this exercise __________ times a day. Strengthening exercises These exercises build strength and endurance in your knee. Endurance is the ability to use your muscles for a long time, even after they get tired. Quadriceps, isometric This exercise stretches the muscles in front of your thigh (quadriceps) without moving your knee joint (isometric). 1. Lie on your back with your left / right leg extended and your other knee bent. Put a rolled towel or small pillow under your knee if told by your health care provider. 2. Slowly tense the muscles in the front of your left /  right thigh. You should see your kneecap slide up toward your hip or see increased dimpling just above the knee. This motion will push the back of the knee toward the floor. 3. For __________ seconds, hold the muscle as tight as you can without increasing your pain. 4. Relax the muscles slowly and completely. Repeat __________ times. Complete this exercise __________ times a day. Straight leg raises This exercise stretches the muscles in front  of your thigh (quadriceps) and the muscles that move your hips (hip flexors). 1. Lie on your back with your left / right leg extended and your other knee bent. 2. Tense the muscles in the front of your left / right thigh. You should see your kneecap slide up or see increased dimpling just above the knee. Your thigh may even shake a bit. 3. Keep these muscles tight as you raise your leg 4-6 inches (10-15 cm) off the floor. Do not let your knee bend. 4. Hold this position for __________ seconds. 5. Keep these muscles tense as you lower your leg. 6. Relax your muscles slowly and completely after each repetition. Repeat __________ times. Complete this exercise __________ times a day. Hamstring, isometric 1. Lie on your back on a firm surface. 2. Bend your left / right knee about __________ degrees. 3. Dig your left / right heel into the surface as if you are trying to pull it toward your buttocks. Tighten the muscles in the back of your thighs (hamstring) to "dig" as hard as you can without increasing any pain. 4. Hold this position for __________ seconds. 5. Release the tension gradually and allow your muscles to relax completely for __________ seconds after each repetition. Repeat __________ times. Complete this exercise __________ times a day. Hamstring curls If told by your health care provider, do this exercise while wearing ankle weights. Begin with __________ lb weights. Then increase the weight by 1 lb (0.5 kg) increments. Do not wear ankle weights that are more than __________ lb. 1. Lie on your abdomen with your legs straight. 2. Bend your left / right knee as far as you can without feeling pain. Keep your hips flat against the floor. 3. Hold this position for __________ seconds. 4. Slowly lower your leg to the starting position. Repeat __________ times. Complete this exercise __________ times a day. Squats This exercise strengthens the muscles in front of your thigh and knee  (quadriceps). 1. Stand in front of a table, with your feet and knees pointing straight ahead. You may rest your hands on the table for balance but not for support. 2. Slowly bend your knees and lower your hips like you are going to sit in a chair. ? Keep your weight over your heels, not over your toes. ? Keep your lower legs upright so they are parallel with the table legs. ? Do not let your hips go lower than your knees. ? Do not bend lower than told by your health care provider. ? If your knee pain increases, do not bend as low. 3. Hold the squat position for __________ seconds. 4. Slowly push with your legs to return to standing. Do not use your hands to pull yourself to standing. Repeat __________ times. Complete this exercise __________ times a day. Wall slides This exercise strengthens the muscles in front of your thigh and knee (quadriceps). 1. Lean your back against a smooth wall or door, and walk your feet out 18-24 inches (46-61 cm) from it. 2. Place your feet hip-width apart. 3.   Slowly slide down the wall or door until your knees bend __________ degrees. Keep your knees over your heels, not over your toes. Keep your knees in line with your hips. 4. Hold this position for __________ seconds. Repeat __________ times. Complete this exercise __________ times a day. Straight leg raises This exercise strengthens the muscles that rotate the leg at the hip and move it away from your body (hip abductors). 1. Lie on your side with your left / right leg in the top position. Lie so your head, shoulder, knee, and hip line up. You may bend your bottom knee to help you keep your balance. 2. Roll your hips slightly forward so your hips are stacked directly over each other and your left / right knee is facing forward. 3. Leading with your heel, lift your top leg 4-6 inches (10-15 cm). You should feel the muscles in your outer hip lifting. ? Do not let your foot drift forward. ? Do not let your knee  roll toward the ceiling. 4. Hold this position for __________ seconds. 5. Slowly return your leg to the starting position. 6. Let your muscles relax completely after each repetition. Repeat __________ times. Complete this exercise __________ times a day. Straight leg raises This exercise stretches the muscles that move your hips away from the front of the pelvis (hip extensors). 1. Lie on your abdomen on a firm surface. You can put a pillow under your hips if that is more comfortable. 2. Tense the muscles in your buttocks and lift your left / right leg about 4-6 inches (10-15 cm). Keep your knee straight as you lift your leg. 3. Hold this position for __________ seconds. 4. Slowly lower your leg to the starting position. 5. Let your leg relax completely after each repetition. Repeat __________ times. Complete this exercise __________ times a day. This information is not intended to replace advice given to you by your health care provider. Make sure you discuss any questions you have with your health care provider. Document Revised: 04/21/2018 Document Reviewed: 04/21/2018 Elsevier Patient Education  2020 [place].  Back Exercises The following exercises strengthen the muscles that help to support the trunk and back. They also help to keep the lower back flexible. Doing these exercises can help to prevent back pain or lessen existing pain.  If you have back pain or discomfort, try doing these exercises 2-3 times each day or as told by your health care provider.  As your pain improves, do them once each day, but increase the number of times that you repeat the steps for each exercise (do more repetitions).  To prevent the recurrence of back pain, continue to do these exercises once each day or as told by your health care provider. Do exercises exactly as told by your health care provider and adjust them as directed. It is normal to feel mild stretching, pulling, tightness, or discomfort  as you do these exercises, but you should stop right away if you feel sudden pain or your pain gets worse. Exercises Single knee to chest Repeat these steps 3-5 times for each leg: 1. Lie on your back on a firm bed or the floor with your legs extended. 2. Bring one knee to your chest. Your other leg should stay extended and in contact with the floor. 3. Hold your knee in place by grabbing your knee or thigh with both hands and hold. 4. Pull on your knee until you feel a gentle stretch in your lower back  or buttocks. 5. Hold the stretch for 10-30 seconds. 6. Slowly release and straighten your leg. Pelvic tilt Repeat these steps 5-10 times: 1. Lie on your back on a firm bed or the floor with your legs extended. 2. Bend your knees so they are pointing toward the ceiling and your feet are flat on the floor. 3. Tighten your lower abdominal muscles to press your lower back against the floor. This motion will tilt your pelvis so your tailbone points up toward the ceiling instead of pointing to your feet or the floor. 4. With gentle tension and even breathing, hold this position for 5-10 seconds. Cat-cow Repeat these steps until your lower back becomes more flexible: 1. Get into a hands-and-knees position on a firm surface. Keep your hands under your shoulders, and keep your knees under your hips. You may place padding under your knees for comfort. 2. Let your head hang down toward your chest. Contract your abdominal muscles and point your tailbone toward the floor so your lower back becomes rounded like the back of a cat. 3. Hold this position for 5 seconds. 4. Slowly lift your head, let your abdominal muscles relax and point your tailbone up toward the ceiling so your back forms a sagging arch like the back of a cow. 5. Hold this position for 5 seconds.  Press-ups Repeat these steps 5-10 times: 1. Lie on your abdomen (face-down) on the floor. 2. Place your palms near your head, about  shoulder-width apart. 3. Keeping your back as relaxed as possible and keeping your hips on the floor, slowly straighten your arms to raise the top half of your body and lift your shoulders. Do not use your back muscles to raise your upper torso. You may adjust the placement of your hands to make yourself more comfortable. 4. Hold this position for 5 seconds while you keep your back relaxed. 5. Slowly return to lying flat on the floor.  Bridges Repeat these steps 10 times: 1. Lie on your back on a firm surface. 2. Bend your knees so they are pointing toward the ceiling and your feet are flat on the floor. Your arms should be flat at your sides, next to your body. 3. Tighten your buttocks muscles and lift your buttocks off the floor until your waist is at almost the same height as your knees. You should feel the muscles working in your buttocks and the back of your thighs. If you do not feel these muscles, slide your feet 1-2 inches farther away from your buttocks. 4. Hold this position for 3-5 seconds. 5. Slowly lower your hips to the starting position, and allow your buttocks muscles to relax completely. If this exercise is too easy, try doing it with your arms crossed over your chest. Abdominal crunches Repeat these steps 5-10 times: 1. Lie on your back on a firm bed or the floor with your legs extended. 2. Bend your knees so they are pointing toward the ceiling and your feet are flat on the floor. 3. Cross your arms over your chest. 4. Tip your chin slightly toward your chest without bending your neck. 5. Tighten your abdominal muscles and slowly raise your trunk (torso) high enough to lift your shoulder blades a tiny bit off the floor. Avoid raising your torso higher than that because it can put too much stress on your low back and does not help to strengthen your abdominal muscles. 6. Slowly return to your starting position. Back lifts Repeat these steps 5-10  times: 1. Lie on your abdomen  (face-down) with your arms at your sides, and rest your forehead on the floor. 2. Tighten the muscles in your legs and your buttocks. 3. Slowly lift your chest off the floor while you keep your hips pressed to the floor. Keep the back of your head in line with the curve in your back. Your eyes should be looking at the floor. 4. Hold this position for 3-5 seconds. 5. Slowly return to your starting position. Contact a health care provider if:  Your back pain or discomfort gets much worse when you do an exercise.  Your worsening back pain or discomfort does not lessen within 2 hours after you exercise. If you have any of these problems, stop doing these exercises right away. Do not do them again unless your health care provider says that you can. Get help right away if:  You develop sudden, severe back pain. If this happens, stop doing the exercises right away. Do not do them again unless your health care provider says that you can. This information is not intended to replace advice given to you by your health care provider. Make sure you discuss any questions you have with your health care provider. Document Revised: 11/05/2018 Document Reviewed: 04/02/2018 Elsevier Patient Education  [place] We placed an order today for your standing lab work.    Please come back and get your standing labs in May and every 3 months We have open lab daily Monday through Thursday from 8:30-12:30 PM and 1:30-4:30 PM and Friday from 8:30-12:30 PM and 1:30-4:00 PM at the office of Dr. Bo Merino.   You may experience shorter wait times on Monday and Friday afternoons. The office is located at [address] [place], [place], [place] [address] No appointment is necessary.   Labs are drawn by [facility].  You may receive a bill from [facility] for your lab work.  If you wish to have your labs drawn at another location, please call the office 24 hours in advance to send orders.  If  you have any questions regarding directions or hours of operation,  please call [phone].   Just as a reminder please drink plenty of water prior to coming for your lab work. Thanks!

## 2019-08-26 NOTE — Telephone Encounter (Signed)
Patient advised we received fax from [facility] regarding temporary supply of Diclofenac Gel. Patient advised she can get it over the counter now.

## 2019-09-01 DIAGNOSIS — F330 Major depressive disorder, recurrent, mild: Secondary | ICD-10-CM | POA: Diagnosis not present

## 2019-09-01 DIAGNOSIS — Z79899 Other long term (current) drug therapy: Secondary | ICD-10-CM | POA: Diagnosis not present

## 2019-09-06 ENCOUNTER — Ambulatory Visit: Payer: Self-pay | Attending: Family

## 2019-09-06 DIAGNOSIS — Z23 Encounter for immunization: Secondary | ICD-10-CM | POA: Insufficient documentation

## 2019-09-06 NOTE — Progress Notes (Signed)
   Covid-19 Vaccination Clinic  Name:  Kirsten Harper    MRN: [id number] DOB: [birth year]  09/06/2019  [name] was observed post Covid-19 immunization for 15 minutes without incidence. She was provided with Vaccine Information Sheet and instruction to access the V-Safe system.   [name] was instructed to call 911 with any severe reactions post vaccine: Marland Kitchen Difficulty breathing  . Swelling of your face and throat  . A fast heartbeat  . A bad rash all over your body  . Dizziness and weakness    Immunizations Administered    Name Date Dose VIS Date Route   Moderna COVID-19 Vaccine 09/06/2019 11:13 AM 0.5 mL 06/15/2019 Intramuscular   Manufacturer: Moderna   Lot: [id number]   [place]:[id number]

## 2019-09-13 DIAGNOSIS — Z6831 Body mass index (BMI) 31.0-31.9, adult: Secondary | ICD-10-CM | POA: Diagnosis not present

## 2019-09-13 DIAGNOSIS — F419 Anxiety disorder, unspecified: Secondary | ICD-10-CM | POA: Diagnosis not present

## 2019-09-13 DIAGNOSIS — Z0000 Encounter for general adult medical examination without abnormal findings: Secondary | ICD-10-CM | POA: Diagnosis not present

## 2019-09-13 DIAGNOSIS — Z008 Encounter for other general examination: Secondary | ICD-10-CM | POA: Diagnosis not present

## 2019-09-13 DIAGNOSIS — M459 Ankylosing spondylitis of unspecified sites in spine: Secondary | ICD-10-CM | POA: Diagnosis not present

## 2019-09-13 DIAGNOSIS — F17201 Nicotine dependence, unspecified, in remission: Secondary | ICD-10-CM | POA: Diagnosis not present

## 2019-09-13 DIAGNOSIS — E669 Obesity, unspecified: Secondary | ICD-10-CM | POA: Diagnosis not present

## 2019-09-13 DIAGNOSIS — J449 Chronic obstructive pulmonary disease, unspecified: Secondary | ICD-10-CM | POA: Diagnosis not present

## 2019-09-20 ENCOUNTER — Other Ambulatory Visit: Payer: Self-pay | Admitting: Internal Medicine

## 2019-09-20 NOTE — Telephone Encounter (Signed)
Last filled 08-24-19 #30 Last OV 06-16-18 Next OV 01-25-20 [facility]

## 2019-09-24 ENCOUNTER — Telehealth: Payer: Self-pay | Admitting: Rheumatology

## 2019-09-24 DIAGNOSIS — M457 Ankylosing spondylitis of lumbosacral region: Secondary | ICD-10-CM

## 2019-09-24 MED ORDER — HUMIRA (2 PEN) 40 MG/0.4ML ~~LOC~~ AJKT: 40.0000 mg | AUTO-INJECTOR | SUBCUTANEOUS | 0 refills | Status: DC

## 2019-09-24 NOTE — Telephone Encounter (Signed)
Patient left a voicemail stating she called [facility] for prescription refill of Humira and was told she needed to schedule an appointment with Dr. Estanislado Pandy before they can refill it.  Patient states she had a follow-up appointment on 08/26/19.  Please advise.

## 2019-09-24 NOTE — Telephone Encounter (Signed)
Patient advised we have not received a refill request on the Humira. Patient advised she is up to date on everything.   Last Visit: 08/26/19 Next Visit: 01/25/20 Labs: 08/26/19 Glucose is 112. Rest of CMP WNL. CBC WNL TB Gold: 12/24/18 Neg   Okay to refill per Dr. Estanislado Pandy   Patient advised prescription has been sent to the pharmacu

## 2019-09-27 DIAGNOSIS — M25562 Pain in left knee: Secondary | ICD-10-CM | POA: Diagnosis not present

## 2019-09-27 DIAGNOSIS — M545 Low back pain: Secondary | ICD-10-CM | POA: Diagnosis not present

## 2019-09-27 DIAGNOSIS — Z79899 Other long term (current) drug therapy: Secondary | ICD-10-CM | POA: Diagnosis not present

## 2019-09-27 DIAGNOSIS — F17210 Nicotine dependence, cigarettes, uncomplicated: Secondary | ICD-10-CM | POA: Diagnosis not present

## 2019-09-27 DIAGNOSIS — M25561 Pain in right knee: Secondary | ICD-10-CM | POA: Diagnosis not present

## 2019-09-27 DIAGNOSIS — M129 Arthropathy, unspecified: Secondary | ICD-10-CM | POA: Diagnosis not present

## 2019-09-27 DIAGNOSIS — E559 Vitamin D deficiency, unspecified: Secondary | ICD-10-CM | POA: Diagnosis not present

## 2019-09-27 DIAGNOSIS — Z1159 Encounter for screening for other viral diseases: Secondary | ICD-10-CM | POA: Diagnosis not present

## 2019-09-27 DIAGNOSIS — G8929 Other chronic pain: Secondary | ICD-10-CM | POA: Diagnosis not present

## 2019-10-05 ENCOUNTER — Ambulatory Visit: Payer: Medicare Other | Attending: Family

## 2019-10-05 DIAGNOSIS — Z23 Encounter for immunization: Secondary | ICD-10-CM

## 2019-10-05 NOTE — Progress Notes (Signed)
   Covid-19 Vaccination Clinic  Name:  Kirsten Harper    MRN: [id number] DOB: 1945/12/21  10/05/2019  [name] was observed post Covid-19 immunization for 15 minutes without incident. She was provided with Vaccine Information Sheet and instruction to access the V-Safe system.   [name] was instructed to call 911 with any severe reactions post vaccine: Marland Kitchen Difficulty breathing  . Swelling of face and throat  . A fast heartbeat  . A bad rash all over body  . Dizziness and weakness   Immunizations Administered    Name Date Dose VIS Date Route   Moderna COVID-19 Vaccine 10/05/2019  2:05 PM 0.5 mL 06/15/2019 Intramuscular   Manufacturer: Moderna   LotHQ:[id number]   [place]:[id number]

## 2019-10-18 ENCOUNTER — Other Ambulatory Visit: Payer: Self-pay | Admitting: Internal Medicine

## 2019-10-18 NOTE — Telephone Encounter (Signed)
Last filled 09-20-19 #30 Last OV 08-03-18 No Future OV [facility]

## 2019-10-21 ENCOUNTER — Other Ambulatory Visit: Payer: Self-pay | Admitting: Internal Medicine

## 2019-10-21 DIAGNOSIS — J449 Chronic obstructive pulmonary disease, unspecified: Secondary | ICD-10-CM

## 2019-10-28 DIAGNOSIS — Z79899 Other long term (current) drug therapy: Secondary | ICD-10-CM | POA: Diagnosis not present

## 2019-10-29 DIAGNOSIS — Z79899 Other long term (current) drug therapy: Secondary | ICD-10-CM | POA: Diagnosis not present

## 2019-10-29 DIAGNOSIS — F410 Panic disorder [episodic paroxysmal anxiety] without agoraphobia: Secondary | ICD-10-CM | POA: Diagnosis not present

## 2019-11-12 DIAGNOSIS — M450 Ankylosing spondylitis of multiple sites in spine: Secondary | ICD-10-CM | POA: Diagnosis not present

## 2019-11-12 DIAGNOSIS — H35371 Puckering of macula, right eye: Secondary | ICD-10-CM | POA: Diagnosis not present

## 2019-11-12 DIAGNOSIS — E113293 Type 2 diabetes mellitus with mild nonproliferative diabetic retinopathy without macular edema, bilateral: Secondary | ICD-10-CM | POA: Diagnosis not present

## 2019-11-12 DIAGNOSIS — H209 Unspecified iridocyclitis: Secondary | ICD-10-CM | POA: Diagnosis not present

## 2019-11-12 DIAGNOSIS — Z961 Presence of intraocular lens: Secondary | ICD-10-CM | POA: Diagnosis not present

## 2019-11-12 DIAGNOSIS — H35033 Hypertensive retinopathy, bilateral: Secondary | ICD-10-CM | POA: Diagnosis not present

## 2019-11-12 DIAGNOSIS — H0233 Blepharochalasis right eye, unspecified eyelid: Secondary | ICD-10-CM | POA: Diagnosis not present

## 2019-11-17 ENCOUNTER — Other Ambulatory Visit: Payer: Self-pay | Admitting: Internal Medicine

## 2019-11-17 DIAGNOSIS — J449 Chronic obstructive pulmonary disease, unspecified: Secondary | ICD-10-CM

## 2019-11-19 ENCOUNTER — Other Ambulatory Visit: Payer: Self-pay | Admitting: Internal Medicine

## 2019-11-19 NOTE — Telephone Encounter (Signed)
Left message for pt to call office

## 2019-11-19 NOTE — Telephone Encounter (Signed)
PDMP shows a refill 4/27 from a [facility] Let her know that I cannot refill this and won't be doing another prescription till we meet in person and she has a reasonable explanation for getting Rx from someone else (and I am not sure she can)

## 2019-11-19 NOTE — Telephone Encounter (Signed)
Last filled 10-18-19 #30 Last OV 08-03-18 No Future OV [facility]

## 2019-11-19 NOTE — Telephone Encounter (Signed)
Spoke to pt. She said there is a letter from Dr Kerry Hough, psychiatrist, sent to [place] that she wants to take 1 in the morning and 1 in the evening. He can not write rxs. Went to [facility] for pain she was having and is having an issue driving. I advised her she cannot get this from 2 Drs and Dr Silvio Pate will not write it if someone else is and she needs an OV. Made OV for 11-25-19. She says she has medication right now.

## 2019-11-25 ENCOUNTER — Ambulatory Visit: Payer: Medicare Other | Admitting: Internal Medicine

## 2019-12-03 ENCOUNTER — Other Ambulatory Visit: Payer: Self-pay | Admitting: Internal Medicine

## 2019-12-03 NOTE — Telephone Encounter (Signed)
Last filled 10-18-2019 #30 Last OV 08-03-18 No Future OV [facility]

## 2019-12-03 NOTE — Telephone Encounter (Signed)
She got a prescription less than 1 month ago from someone else ([facility]) Find out who that is---and we cannot refill this Rx at this time because of this

## 2019-12-07 NOTE — Telephone Encounter (Signed)
From 11/19/19 Telephone Encounter Note   Spoke to pt. She said there is a letter from Dr Kerry Hough, psychiatrist, sent to [place] that she wants to take 1 in the morning and 1 in the evening. He can not write rxs. Went to [facility] for pain she was having and is having an issue driving. I advised her she cannot get this from 2 Drs and Dr Silvio Pate will not write it if someone else is and she needs an OV. Made OV for 11-25-19. She says she has medication right now.      Pt cancelled the 11-25-19 OV.

## 2019-12-07 NOTE — Telephone Encounter (Signed)
Noted We will need to have her sign contract then so she understands the rules

## 2019-12-20 ENCOUNTER — Other Ambulatory Visit: Payer: Self-pay | Admitting: *Deleted

## 2019-12-20 DIAGNOSIS — Z008 Encounter for other general examination: Secondary | ICD-10-CM | POA: Diagnosis not present

## 2019-12-20 DIAGNOSIS — E669 Obesity, unspecified: Secondary | ICD-10-CM | POA: Diagnosis not present

## 2019-12-20 DIAGNOSIS — F419 Anxiety disorder, unspecified: Secondary | ICD-10-CM | POA: Diagnosis not present

## 2019-12-20 DIAGNOSIS — M457 Ankylosing spondylitis of lumbosacral region: Secondary | ICD-10-CM

## 2019-12-20 DIAGNOSIS — Z6830 Body mass index (BMI) 30.0-30.9, adult: Secondary | ICD-10-CM | POA: Diagnosis not present

## 2019-12-20 NOTE — Telephone Encounter (Signed)
Refill request received via fax  Last Visit: 08/26/19 Next Visit: 01/25/20 Labs: 08/26/19 Glucose is 112. Rest of CMP WNL. CBC WNL TB Gold: 12/24/18 Neg   Attempted to contact the patient and left message to advise patient she due to update labs.   Okay to refill 30 day supply Humira?

## 2019-12-21 ENCOUNTER — Other Ambulatory Visit: Payer: Self-pay | Admitting: *Deleted

## 2019-12-21 ENCOUNTER — Other Ambulatory Visit: Payer: Self-pay | Admitting: Internal Medicine

## 2019-12-21 DIAGNOSIS — J449 Chronic obstructive pulmonary disease, unspecified: Secondary | ICD-10-CM

## 2019-12-21 DIAGNOSIS — Z9225 Personal history of immunosupression therapy: Secondary | ICD-10-CM

## 2019-12-21 DIAGNOSIS — Z79899 Other long term (current) drug therapy: Secondary | ICD-10-CM

## 2019-12-21 DIAGNOSIS — Z111 Encounter for screening for respiratory tuberculosis: Secondary | ICD-10-CM

## 2019-12-21 MED ORDER — HUMIRA (2 PEN) 40 MG/0.4ML ~~LOC~~ AJKT: 40.0000 mg | AUTO-INJECTOR | SUBCUTANEOUS | 0 refills | Status: DC

## 2019-12-21 NOTE — Telephone Encounter (Signed)
She is due to update CBC, CMP, and TB gold.  Please make sure orders for all 3 labs are in place.   Ok to refill 30 day supply only.

## 2019-12-21 NOTE — Progress Notes (Signed)
Orders placed.

## 2019-12-23 ENCOUNTER — Other Ambulatory Visit: Payer: Self-pay

## 2019-12-23 DIAGNOSIS — Z111 Encounter for screening for respiratory tuberculosis: Secondary | ICD-10-CM

## 2019-12-23 DIAGNOSIS — Z79899 Other long term (current) drug therapy: Secondary | ICD-10-CM

## 2019-12-23 DIAGNOSIS — Z9225 Personal history of immunosupression therapy: Secondary | ICD-10-CM

## 2019-12-24 ENCOUNTER — Telehealth: Payer: Self-pay | Admitting: Rheumatology

## 2019-12-24 NOTE — Telephone Encounter (Signed)
Left message to advise patient the TB Gold was drawn with her lab work on 12/23/2019.

## 2019-12-24 NOTE — Telephone Encounter (Signed)
Patient called stating she was in the office yesterday for labwork and doesn't remember if Olivia Mackie did the TB gold test.

## 2019-12-25 LAB — COMPLETE METABOLIC PANEL WITH GFR
AG Ratio: 1.6 (calc) (ref 1.0–2.5)
ALT: 25 U/L (ref 6–29)
AST: 21 U/L (ref 10–35)
Albumin: 4.3 g/dL (ref 3.6–5.1)
Alkaline phosphatase (APISO): 85 U/L (ref 37–153)
BUN: 8 mg/dL (ref 7–25)
CO2: 26 mmol/L (ref 20–32)
Calcium: 9.6 mg/dL (ref 8.6–10.4)
Chloride: 99 mmol/L (ref 98–110)
Creat: 0.87 mg/dL (ref 0.60–0.93)
GFR, Est African American: 77 mL/min/{1.73_m2} (ref >=60)
GFR, Est Non African American: 67 mL/min/{1.73_m2} (ref >=60)
Globulin: 2.7 g/dL (calc) (ref 1.9–3.7)
Glucose, Bld: 98 mg/dL (ref 65–99)
Potassium: 3.9 mmol/L (ref 3.5–5.3)
Sodium: 134 mmol/L — ABNORMAL LOW (ref 135–146)
Total Bilirubin: 1 mg/dL (ref 0.2–1.2)
Total Protein: 7 g/dL (ref 6.1–8.1)

## 2019-12-25 LAB — CBC WITH DIFFERENTIAL/PLATELET
Absolute Monocytes: 702 cells/uL (ref 200–950)
Basophils Absolute: 48 cells/uL (ref 0–200)
Basophils Relative: 0.8 %
Eosinophils Absolute: 282 cells/uL (ref 15–500)
Eosinophils Relative: 4.7 %
HCT: 42.3 % (ref 35.0–45.0)
Hemoglobin: 14.8 g/dL (ref 11.7–15.5)
Lymphs Abs: 1716 cells/uL (ref 850–3900)
MCH: 33 pg (ref 27.0–33.0)
MCHC: 35 g/dL (ref 32.0–36.0)
MCV: 94.2 fL (ref 80.0–100.0)
MPV: 10.2 fL (ref 7.5–12.5)
Monocytes Relative: 11.7 %
Neutro Abs: 3252 cells/uL (ref 1500–7800)
Neutrophils Relative %: 54.2 %
Platelets: 200 10*3/uL (ref 140–400)
RBC: 4.49 10*6/uL (ref 3.80–5.10)
RDW: 12.2 % (ref 11.0–15.0)
Total Lymphocyte: 28.6 %
WBC: 6 10*3/uL (ref 3.8–10.8)

## 2019-12-25 LAB — QUANTIFERON-TB GOLD PLUS
Mitogen-NIL: 9.23 IU/mL
NIL: 0.05 IU/mL
QuantiFERON-TB Gold Plus: NEGATIVE
TB1-NIL: <0 IU/mL
TB2-NIL: <0 IU/mL

## 2019-12-29 ENCOUNTER — Other Ambulatory Visit: Payer: Self-pay | Admitting: Internal Medicine

## 2019-12-29 DIAGNOSIS — J449 Chronic obstructive pulmonary disease, unspecified: Secondary | ICD-10-CM

## 2020-01-05 NOTE — Progress Notes (Signed)
Office Visit Note  Patient: Kirsten Harper             Date of Birth: Apr 03, 1946           MRN: [id number]             PCP: Venia Carbon, MD Referring: Venia Carbon, MD Visit Date: 01/07/2020 Occupation: _0 @  Subjective:  Pain in both knees   History of Present Illness: Kirsten Harper is a 74 y.o. female with history of ankylosing spondylitis, uveitis, and osteoarthritis.  She is on Humira 40 mg sq injections once every 14 days.  She presents today with pain in both knee joints.  She denies any joint swelling at this time.  She requested bilateral knee joint cortisone injections.  Patient reports that she has been having increased arthralgias and joint stiffness recently which she attributes to being under increased stress at home.  She states last week she was having pain and swelling in her right wrist joint which has resolved.  She denies any increased uveitis flares recently.  She denies any SI joint pain at this time.  She denies any Achilles tendinitis or plantar fasciitis.   Activities of Daily Living:  Patient reports joint stiffness all day Patient Denies nocturnal pain.  Difficulty dressing/grooming: Denies Difficulty climbing stairs: Reports Difficulty getting out of chair: Reports Difficulty using hands for taps, buttons, cutlery, and/or writing: Reports  Review of Systems  Constitutional: Positive for fatigue.  HENT: Positive for mouth dryness and nose dryness. Negative for mouth sores.   Eyes: Positive for photophobia and visual disturbance. Negative for pain, redness and dryness.  Respiratory: Negative for shortness of breath and difficulty breathing.   Cardiovascular: Negative for chest pain and palpitations.  Gastrointestinal: Negative for blood in stool, constipation and diarrhea.  Endocrine: Negative for increased urination.  Genitourinary: Negative for difficulty urinating and painful urination.  Musculoskeletal: Positive for arthralgias, joint  pain, joint swelling and morning stiffness. Negative for myalgias, muscle tenderness and myalgias.  Skin: Negative for color change, rash and redness.  Allergic/Immunologic: Negative for susceptible to infections.  Neurological: Positive for weakness. Negative for dizziness, numbness, headaches and memory loss.  Hematological: Negative for bruising/bleeding tendency.  Psychiatric/Behavioral: Negative for confusion.    PMFS History:  Patient Active Problem List   Diagnosis Date Noted  . Colitis 07/28/2018  . Preventative health care 05/04/2018  . Advance directive discussed with patient 05/04/2018  . RLS (restless legs syndrome) 04/25/2017  . Mood disorder ([place]) 04/08/2017  . Sensory loss 04/08/2017  . COPD (chronic obstructive pulmonary disease) ([place]) 04/08/2017  . Ankylosing spondylitis of lumbosacral region ([place]) 09/24/2016  . GAD (generalized anxiety disorder) 08/27/2016  . 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 Spondyloarthropathy HLA-B27 positive 06/06/2016  . Iritis 06/06/2016  . High risk medication use 06/06/2016  . Primary osteoarthritis of both hands 06/06/2016  . Smoker 06/06/2016  . COPD exacerbation ([place]) 04/10/2016  . Anxiety 04/10/2016  . Essential hypertension     Past Medical History:  Diagnosis Date  . Ankylosing spondylitis ([place])   . Anxiety   . Arthritis   . Chronic lower back pain   . CKD (chronic kidney disease), stage II    "related to the Ankylosing spondylitis" (04/10/2016)  . COPD (chronic obstructive pulmonary disease) ([place])   . Eye disease   . Hypertension   . PONV (postoperative nausea and vomiting)    "didn't bother me the last time I had it in ~  2015"    Family History  Problem Relation Age of Onset  . Hypertension Mother   . Heart disease Sister   . Diabetes Sister   . Pulmonary fibrosis Brother   . Idiopathic pulmonary fibrosis Brother   . Alzheimer's disease Brother   . Dementia Brother    Past Surgical  History:  Procedure Laterality Date  . BROW LIFT AND BLEPHAROPLASTY Bilateral ~ 2015  . CATARACT EXTRACTION W/ INTRAOCULAR LENS  IMPLANT, BILATERAL Bilateral 2000s  . CYST REMOVAL TRUNK  12/2018  . MANDIBLE SURGERY  ~ 2000   "jaw had moved to left; broke my jaw in 5 places; put titanium in; stripped muscle from bone; wired jaw shut"  . TUBAL LIGATION  1979  . VAGINAL HYSTERECTOMY  1985   Social History   Social History Narrative   2 sons--not in area   Lives on property with her mother [name]      Has living will   Would want one of her sons, [name], to be Media planner   Requests DNR   No tube feeds   Immunization History  Administered Date(s) Administered  . Influenza Inj Mdck Quad Pf 04/16/2018  . Influenza-Unspecified 04/09/2019  . Moderna SARS-COVID-2 Vaccination 09/06/2019, 10/05/2019  . Pneumococcal Conjugate-13 05/10/2013  . Pneumococcal Polysaccharide-23 09/20/2014  . Tdap 04/30/2011  . Zoster Recombinat (Shingrix) 10/01/2016     Objective: Vital Signs: BP 128/81 (BP Location: Left Arm, Patient Position: Sitting, Cuff Size: Normal)   Pulse 94   Resp 18   Ht 5' 7.5" (1.715 m)   Wt 199 lb (90.3 kg)   BMI 30.71 kg/m    Physical Exam Vitals and nursing note reviewed.  Constitutional:      Appearance: She is well-developed.  HENT:     Head: Normocephalic and atraumatic.  Eyes:     Conjunctiva/sclera: Conjunctivae normal.  Pulmonary:     Effort: Pulmonary effort is normal.  Abdominal:     General: Bowel sounds are normal.     Palpations: Abdomen is soft.  Musculoskeletal:     Cervical back: Normal range of motion.  Lymphadenopathy:     Cervical: No cervical adenopathy.  Skin:    General: Skin is warm and dry.     Capillary Refill: Capillary refill takes less than 2 seconds.  Neurological:     Mental Status: She is alert and oriented to person, place, and time.  Psychiatric:        Behavior: Behavior normal.      Musculoskeletal Exam: C-spine  good range of motion with no discomfort.  No midline spinal tenderness.  No SI joint tenderness.  Shoulder joints, elbow joints, wrist joints, MCPs, PIPs and DIPs have good range of motion with no synovitis.  Synovial thickening of the right wrist noted. PIP and DIP thickening consistent with osteoarthritis of both hands. Hip joints have good range of motion with no discomfort.  Knee joints have good range of motion with no warmth or effusion.  She has bilateral knee crepitus.  Ankle joints have good range of motion with no tenderness or inflammation.  No Achilles tendinitis or plantar fasciitis.  CDAI Exam: CDAI Score: -- Patient Global: --; Provider Global: -- Swollen: --; Tender: -- Joint Exam 01/07/2020   No joint exam has been documented for this visit   There is currently no information documented on the homunculus. Go to the Rheumatology activity and complete the homunculus joint exam.  Investigation: No additional findings.  Imaging: XR KNEE 3  VIEW LEFT  Result Date: 01/07/2020 Moderate to severe medial compartment narrowing with intercondylar osteophytes was noted.  No chondrocalcinosis was noted.  Severe patellofemoral narrowing was noted.  No significant radiographic progression noted when compared to the x-rays of 2019. Impression: These findings are consistent with moderate to severe medial compartment narrowing and severe chondromalacia patella.  XR KNEE 3 VIEW RIGHT  Result Date: 01/07/2020 Moderate to severe medial compartment narrowing was noted.  She has some radiographic progression when compared to the x-rays of 2019.  Intercondylar osteophytes were noted.  Severe patellofemoral narrowing was noted.  No chondrocalcinosis was noted. Impression: These findings are consistent with moderate to severe osteoarthritis and severe chondromalacia patella.   Recent Labs: Lab Results  Component Value Date   WBC 6.0 12/23/2019   HGB 14.8 12/23/2019   PLT 200 12/23/2019   NA 134  (L) 12/23/2019   K 3.9 12/23/2019   CL 99 12/23/2019   CO2 26 12/23/2019   GLUCOSE 98 12/23/2019   BUN 8 12/23/2019   CREATININE 0.87 12/23/2019   BILITOT 1.0 12/23/2019   ALKPHOS 98 07/27/2018   AST 21 12/23/2019   ALT 25 12/23/2019   PROT 7.0 12/23/2019   ALBUMIN 4.3 08/03/2018   CALCIUM 9.6 12/23/2019   GFRAA 77 12/23/2019   QFTBGOLDPLUS NEGATIVE 12/23/2019    Speciality Comments: No specialty comments available.  Procedures:  Large Joint Inj: bilateral knee on 01/07/2020 10:56 AM Indications: pain Details: 27 G 1.5 in needle, medial approach  Arthrogram: No  Medications (Right): 1.5 mL lidocaine 1 %; 40 mg triamcinolone acetonide 40 MG/ML Aspirate (Right): 0 mL Medications (Left): 1.5 mL lidocaine 1 %; 40 mg triamcinolone acetonide 40 MG/ML Aspirate (Left): 0 mL Outcome: tolerated well, no immediate complications Procedure, treatment alternatives, risks and benefits explained, specific risks discussed. Consent was given by the patient. Immediately prior to procedure a time out was called to verify the correct patient, procedure, equipment, support staff and site/side marked as required. Patient was prepped and draped in the usual sterile fashion.     Allergies: Amoxapine and related, Seroquel [quetiapine], and Other   Assessment / Plan:     Visit Diagnoses: Ankylosing spondylitis of lumbosacral region [facility]): She has not had any signs or symptoms of a flare recently.  She is clinically doing well on Humira 40 mg subcutaneous injections every 14 days.  She has not missed any doses of Humira recently.  C-spine has good range of motion on exam.  She has some limited range of motion in thoracic and lumbar spine.  No midline spinal tenderness or SI joint tenderness was noted.  She has not had any Achilles tendinitis or plantar fasciitis recently.  She has not had any recent uveitis flares.  She is been having increased arthralgias and joint stiffness which she attributes to being  under increased stress recently.  She presents today with pain in both knee joints.  X-rays of both knees were updated today which were consistent with moderate to severe osteoarthritis.  Both knees were injected with cortisone.  She declined physical therapy at this time.  She will continue on Humira as prescribed.  She is advised to notify us if she develops any new or worsening symptoms.  She will follow-up in the office in 5 months.  High risk medication use - Humira 40 mg sq injections every 14 days.  CBC and CMP were drawn on 12/23/2019.  She will be due to update lab work in September and every 3 months  to monitor for drug toxicity.  TB gold was negative on 12/23/2019 and we will continue to monitor yearly.  We discussed the importance of holding Humira if she develops any signs or symptoms of an infection and to resume once the infection has completely cleared.  We also discussed the importance of yearly skin exams while on Humira due to the increased risk of skin cancer.  HLA B 27 Positive   Uveitis: No recent flares.  She will continue on Humira 40 mg sq injections every 14 days.    Chronic midline low back pain without sciatica - Severe narrowing between L3-4 and L4-5 was noted.  L4-5 spondylolisthesis was noted.  No vertebral fracture was noted.  No midline spinal tenderness.    Primary osteoarthritis of both hands: He has PIP and DIP thickening consistent with osteoarthritis of both hands.  She has chronic pain in both hands due to underlying osteoarthritis.  No inflammation was noted on exam today.  She has complete fist formation.  Joint protection and muscle strengthening were discussed.  Primary osteoarthritis of both knees: She presents today with acute on chronic pain in both knee joints.  She has been experiencing increased discomfort over the past several weeks.  She has had several recent falls which has exacerbated her discomfort.  X-rays of both knees were updated today which were  consistent with moderate to severe osteoarthritis and severe chondromalacia patella.  No progression of the left knee arthritis was noted.  Right knee arthritis has progressed since 2019.  She had cortisone injections in both knee joints in January 2020 which provided significant relief.  She requested cortisone injections today.  She tolerated the procedure well.  The procedure notes were completed above.  Aftercare was discussed.  She was advised to monitor her blood pressure closely.  We discussed the importance of lower extremity muscle strengthening and fall prevention.  She declined a physical therapy referral at this time.  She was given a handout of home exercises to perform.  Chronic pain of both knees -She presents today with increased pain in both knee joints.  She has good range of motion of both knees with crepitus bilaterally.  No warmth or effusion was noted.  Both knees were injected with cortisone today.  She tolerated the procedure well.  The procedure notes were completed above.  Plan: XR KNEE 3 VIEW RIGHT, XR KNEE 3 VIEW LEFT  Other medical conditions are listed as follows:  History of hypertension  History of COPD  History of anxiety  Former smoker    Orders: Orders Placed This Encounter  Procedures  . Large Joint Inj  . XR KNEE 3 VIEW RIGHT  . XR KNEE 3 VIEW LEFT   No orders of the defined types were placed in this encounter.   Face-to-face time spent with patient was 30 minutes. Greater than 50% of time was spent in counseling and coordination of care.  Follow-Up Instructions: Return in about 5 months (around 06/08/2020) for AS, uveitis , Osteoarthritis.   Ofilia Neas, PA-C  Note - This record has been created using Dragon software.  Chart creation errors have been sought, but may not always  have been located. Such creation errors do not reflect on  the standard of medical care.

## 2020-01-06 ENCOUNTER — Ambulatory Visit: Payer: Medicare Other | Admitting: Rheumatology

## 2020-01-07 ENCOUNTER — Other Ambulatory Visit: Payer: Self-pay

## 2020-01-07 ENCOUNTER — Ambulatory Visit: Payer: Self-pay

## 2020-01-07 ENCOUNTER — Ambulatory Visit (INDEPENDENT_AMBULATORY_CARE_PROVIDER_SITE_OTHER): Payer: Medicare Other | Admitting: Physician Assistant

## 2020-01-07 ENCOUNTER — Encounter: Payer: Self-pay | Admitting: Physician Assistant

## 2020-01-07 VITALS — BP 128/81 | HR 94 | Resp 18 | Ht 67.5 in | Wt 199.0 lb

## 2020-01-07 DIAGNOSIS — M25562 Pain in left knee: Secondary | ICD-10-CM | POA: Diagnosis not present

## 2020-01-07 DIAGNOSIS — M19041 Primary osteoarthritis, right hand: Secondary | ICD-10-CM

## 2020-01-07 DIAGNOSIS — M47819 Spondylosis without myelopathy or radiculopathy, site unspecified: Secondary | ICD-10-CM | POA: Diagnosis not present

## 2020-01-07 DIAGNOSIS — H209 Unspecified iridocyclitis: Secondary | ICD-10-CM | POA: Diagnosis not present

## 2020-01-07 DIAGNOSIS — Z79899 Other long term (current) drug therapy: Secondary | ICD-10-CM

## 2020-01-07 DIAGNOSIS — M25561 Pain in right knee: Secondary | ICD-10-CM | POA: Diagnosis not present

## 2020-01-07 DIAGNOSIS — G8929 Other chronic pain: Secondary | ICD-10-CM

## 2020-01-07 DIAGNOSIS — Z87891 Personal history of nicotine dependence: Secondary | ICD-10-CM

## 2020-01-07 DIAGNOSIS — Z8709 Personal history of other diseases of the respiratory system: Secondary | ICD-10-CM

## 2020-01-07 DIAGNOSIS — Z8679 Personal history of other diseases of the circulatory system: Secondary | ICD-10-CM

## 2020-01-07 DIAGNOSIS — M457 Ankylosing spondylitis of lumbosacral region: Secondary | ICD-10-CM | POA: Diagnosis not present

## 2020-01-07 DIAGNOSIS — M19042 Primary osteoarthritis, left hand: Secondary | ICD-10-CM

## 2020-01-07 DIAGNOSIS — Z8659 Personal history of other mental and behavioral disorders: Secondary | ICD-10-CM

## 2020-01-07 DIAGNOSIS — M170 Bilateral primary osteoarthritis of knee: Secondary | ICD-10-CM

## 2020-01-07 DIAGNOSIS — M545 Low back pain: Secondary | ICD-10-CM

## 2020-01-07 MED ORDER — TRIAMCINOLONE ACETONIDE 40 MG/ML IJ SUSP
40.0000 mg | INTRAMUSCULAR | Status: AC | PRN
  Administered 2020-01-07: 40 mg via INTRA_ARTICULAR

## 2020-01-07 MED ORDER — LIDOCAINE HCL 1 % IJ SOLN
1.5000 mL | INTRAMUSCULAR | Status: AC | PRN
  Administered 2020-01-07: 1.5 mL

## 2020-01-07 NOTE — Patient Instructions (Addendum)
Standing Labs We placed an order today for your standing lab work.   Please have your standing labs drawn in September and every 3 months   If possible, please have your labs drawn 2 weeks prior to your appointment so that the provider can discuss your results at your appointment.  We have open lab daily Monday through Thursday from 8:30-12:30 PM and 1:30-4:30 PM and Friday from 8:30-12:30 PM and 1:30-4:00 PM at the office of Dr. Bo Merino, [facility].   You may experience shorter wait times on Monday and Friday afternoons. The office is located at [address] [place], [place], [place] [address] No appointment is necessary.   Labs are drawn by [facility].  You may receive a bill from [facility] for your lab work.  If you wish to have your labs drawn at another location, please call the office 24 hours in advance to send orders.  If you have any questions regarding directions or hours of operation,  please call [phone].   As a reminder, please drink plenty of water prior to coming for your lab work. Thanks!   Journal for Nurse Practitioners, 15(4), [phone]. Retrieved April 20, 2018 from http://clinicalkey.com/nursing">  Knee Exercises Ask your health care provider which exercises are safe for you. Do exercises exactly as told by your health care provider and adjust them as directed. It is normal to feel mild stretching, pulling, tightness, or discomfort as you do these exercises. Stop right away if you feel sudden pain or your pain gets worse. Do not begin these exercises until told by your health care provider. Stretching and range-of-motion exercises These exercises warm up your muscles and joints and improve the movement and flexibility of your knee. These exercises also help to relieve pain and swelling. Knee extension, prone 1. Lie on your abdomen (prone position) on a bed. 2. Place your left / right knee just beyond the edge of the surface so your knee is  not on the bed. You can put a towel under your left / right thigh just above your kneecap for comfort. 3. Relax your leg muscles and allow gravity to straighten your knee (extension). You should feel a stretch behind your left / right knee. 4. Hold this position for __________ seconds. 5. Scoot up so your knee is supported between repetitions. Repeat __________ times. Complete this exercise __________ times a day. Knee flexion, active  1. Lie on your back with both legs straight. If this causes back discomfort, bend your left / right knee so your foot is flat on the floor. 2. Slowly slide your left / right heel back toward your buttocks. Stop when you feel a gentle stretch in the front of your knee or thigh (flexion). 3. Hold this position for __________ seconds. 4. Slowly slide your left / right heel back to the starting position. Repeat __________ times. Complete this exercise __________ times a day. Quadriceps stretch, prone  1. Lie on your abdomen on a firm surface, such as a bed or padded floor. 2. Bend your left / right knee and hold your ankle. If you cannot reach your ankle or pant leg, loop a belt around your foot and grab the belt instead. 3. Gently pull your heel toward your buttocks. Your knee should not slide out to the side. You should feel a stretch in the front of your thigh and knee (quadriceps). 4. Hold this position for __________ seconds. Repeat __________ times. Complete this exercise __________ times a day. Hamstring, supine  1. Lie on your back (supine position). 2. Loop a belt or towel over the ball of your left / right foot. The ball of your foot is on the walking surface, right under your toes. 3. Straighten your left / right knee and slowly pull on the belt to raise your leg until you feel a gentle stretch behind your knee (hamstring). ? Do not let your knee bend while you do this. ? Keep your other leg flat on the floor. 4. Hold this position for __________  seconds. Repeat __________ times. Complete this exercise __________ times a day. Strengthening exercises These exercises build strength and endurance in your knee. Endurance is the ability to use your muscles for a long time, even after they get tired. Quadriceps, isometric This exercise stretches the muscles in front of your thigh (quadriceps) without moving your knee joint (isometric). 1. Lie on your back with your left / right leg extended and your other knee bent. Put a rolled towel or small pillow under your knee if told by your health care provider. 2. Slowly tense the muscles in the front of your left / right thigh. You should see your kneecap slide up toward your hip or see increased dimpling just above the knee. This motion will push the back of the knee toward the floor. 3. For __________ seconds, hold the muscle as tight as you can without increasing your pain. 4. Relax the muscles slowly and completely. Repeat __________ times. Complete this exercise __________ times a day. Straight leg raises This exercise stretches the muscles in front of your thigh (quadriceps) and the muscles that move your hips (hip flexors). 1. Lie on your back with your left / right leg extended and your other knee bent. 2. Tense the muscles in the front of your left / right thigh. You should see your kneecap slide up or see increased dimpling just above the knee. Your thigh may even shake a bit. 3. Keep these muscles tight as you raise your leg 4-6 inches (10-15 cm) off the floor. Do not let your knee bend. 4. Hold this position for __________ seconds. 5. Keep these muscles tense as you lower your leg. 6. Relax your muscles slowly and completely after each repetition. Repeat __________ times. Complete this exercise __________ times a day. Hamstring, isometric 1. Lie on your back on a firm surface. 2. Bend your left / right knee about __________ degrees. 3. Dig your left / right heel into the surface as if  you are trying to pull it toward your buttocks. Tighten the muscles in the back of your thighs (hamstring) to "dig" as hard as you can without increasing any pain. 4. Hold this position for __________ seconds. 5. Release the tension gradually and allow your muscles to relax completely for __________ seconds after each repetition. Repeat __________ times. Complete this exercise __________ times a day. Hamstring curls If told by your health care provider, do this exercise while wearing ankle weights. Begin with __________ lb weights. Then increase the weight by 1 lb (0.5 kg) increments. Do not wear ankle weights that are more than __________ lb. 1. Lie on your abdomen with your legs straight. 2. Bend your left / right knee as far as you can without feeling pain. Keep your hips flat against the floor. 3. Hold this position for __________ seconds. 4. Slowly lower your leg to the starting position. Repeat __________ times. Complete this exercise __________ times a day. Squats This exercise strengthens the muscles in front  of your thigh and knee (quadriceps). 1. Stand in front of a table, with your feet and knees pointing straight ahead. You may rest your hands on the table for balance but not for support. 2. Slowly bend your knees and lower your hips like you are going to sit in a chair. ? Keep your weight over your heels, not over your toes. ? Keep your lower legs upright so they are parallel with the table legs. ? Do not let your hips go lower than your knees. ? Do not bend lower than told by your health care provider. ? If your knee pain increases, do not bend as low. 3. Hold the squat position for __________ seconds. 4. Slowly push with your legs to return to standing. Do not use your hands to pull yourself to standing. Repeat __________ times. Complete this exercise __________ times a day. Wall slides This exercise strengthens the muscles in front of your thigh and knee (quadriceps). 1. Lean  your back against a smooth wall or door, and walk your feet out 18-24 inches (46-61 cm) from it. 2. Place your feet hip-width apart. 3. Slowly slide down the wall or door until your knees bend __________ degrees. Keep your knees over your heels, not over your toes. Keep your knees in line with your hips. 4. Hold this position for __________ seconds. Repeat __________ times. Complete this exercise __________ times a day. Straight leg raises This exercise strengthens the muscles that rotate the leg at the hip and move it away from your body (hip abductors). 1. Lie on your side with your left / right leg in the top position. Lie so your head, shoulder, knee, and hip line up. You may bend your bottom knee to help you keep your balance. 2. Roll your hips slightly forward so your hips are stacked directly over each other and your left / right knee is facing forward. 3. Leading with your heel, lift your top leg 4-6 inches (10-15 cm). You should feel the muscles in your outer hip lifting. ? Do not let your foot drift forward. ? Do not let your knee roll toward the ceiling. 4. Hold this position for __________ seconds. 5. Slowly return your leg to the starting position. 6. Let your muscles relax completely after each repetition. Repeat __________ times. Complete this exercise __________ times a day. Straight leg raises This exercise stretches the muscles that move your hips away from the front of the pelvis (hip extensors). 1. Lie on your abdomen on a firm surface. You can put a pillow under your hips if that is more comfortable. 2. Tense the muscles in your buttocks and lift your left / right leg about 4-6 inches (10-15 cm). Keep your knee straight as you lift your leg. 3. Hold this position for __________ seconds. 4. Slowly lower your leg to the starting position. 5. Let your leg relax completely after each repetition. Repeat __________ times. Complete this exercise __________ times a day. This  information is not intended to replace advice given to you by your health care provider. Make sure you discuss any questions you have with your health care provider. Document Revised: 04/21/2018 Document Reviewed: 04/21/2018 Elsevier Patient Education  2020 [employer].

## 2020-01-14 ENCOUNTER — Telehealth: Payer: Self-pay | Admitting: Rheumatology

## 2020-01-14 NOTE — Telephone Encounter (Signed)
Verified verbal prescription with Hazel Sams, PA-C to give patient prescription for a walker.    Patient advised she will need to contact her insurance to see if they will cover a second one. She wants [place] to send her a prescription in the mail. Prescription mailed to patient.

## 2020-01-14 NOTE — Telephone Encounter (Signed)
Patient requests a call back to discuss getting a second walker if possible. Patient wants to know if insurance will cover another walker? Patient does not want to carry original walker up, and down steps. Patient will need a new RX for walker. Please call to advise.

## 2020-01-15 ENCOUNTER — Other Ambulatory Visit: Payer: Self-pay | Admitting: Internal Medicine

## 2020-01-15 DIAGNOSIS — J449 Chronic obstructive pulmonary disease, unspecified: Secondary | ICD-10-CM

## 2020-01-24 ENCOUNTER — Telehealth: Payer: Self-pay | Admitting: Rheumatology

## 2020-01-24 DIAGNOSIS — M457 Ankylosing spondylitis of lumbosacral region: Secondary | ICD-10-CM

## 2020-01-24 MED ORDER — HUMIRA (2 PEN) 40 MG/0.4ML ~~LOC~~ AJKT: 40.0000 mg | AUTO-INJECTOR | SUBCUTANEOUS | 0 refills | Status: DC

## 2020-01-24 NOTE — Telephone Encounter (Signed)
Patient called requesting prescription refill of Humira.  Patient states they need a new prescription in order to refill the prescription.  Patient states they will also be sending a fax to our office.  Patient states she ran out of medication on Friday, 01/21/20 and requesting the refill be sent ASAP.

## 2020-01-24 NOTE — Telephone Encounter (Signed)
Last Visit: 01/07/2020 Next Visit: 06/13/2020 Labs: 12/23/2019 CBC WNL. Sodium is borderline low. Rest of CMP WNL.  TB Gold: 12/23/2019 negative   Current Dose per office note on 01/07/2020: Humira 40 mg sq injections every 14 days.  AE:[id number] spondylitis of lumbosacral region  Okay to refill humira?

## 2020-01-25 ENCOUNTER — Ambulatory Visit: Payer: Medicare Other | Admitting: Rheumatology

## 2020-01-26 ENCOUNTER — Other Ambulatory Visit: Payer: Self-pay | Admitting: Internal Medicine

## 2020-01-26 DIAGNOSIS — J449 Chronic obstructive pulmonary disease, unspecified: Secondary | ICD-10-CM

## 2020-02-22 DIAGNOSIS — Z23 Encounter for immunization: Secondary | ICD-10-CM | POA: Diagnosis not present

## 2020-02-23 ENCOUNTER — Other Ambulatory Visit: Payer: Self-pay | Admitting: Internal Medicine

## 2020-02-23 DIAGNOSIS — J449 Chronic obstructive pulmonary disease, unspecified: Secondary | ICD-10-CM

## 2020-03-07 DIAGNOSIS — Z008 Encounter for other general examination: Secondary | ICD-10-CM | POA: Diagnosis not present

## 2020-03-07 DIAGNOSIS — F17210 Nicotine dependence, cigarettes, uncomplicated: Secondary | ICD-10-CM | POA: Diagnosis not present

## 2020-03-07 DIAGNOSIS — Z0000 Encounter for general adult medical examination without abnormal findings: Secondary | ICD-10-CM | POA: Diagnosis not present

## 2020-03-07 DIAGNOSIS — E669 Obesity, unspecified: Secondary | ICD-10-CM | POA: Diagnosis not present

## 2020-03-07 DIAGNOSIS — Z6830 Body mass index (BMI) 30.0-30.9, adult: Secondary | ICD-10-CM | POA: Diagnosis not present

## 2020-03-07 DIAGNOSIS — F419 Anxiety disorder, unspecified: Secondary | ICD-10-CM | POA: Diagnosis not present

## 2020-03-07 DIAGNOSIS — F331 Major depressive disorder, recurrent, moderate: Secondary | ICD-10-CM | POA: Diagnosis not present

## 2020-03-07 DIAGNOSIS — D179 Benign lipomatous neoplasm, unspecified: Secondary | ICD-10-CM | POA: Diagnosis not present

## 2020-03-08 DIAGNOSIS — Z6829 Body mass index (BMI) 29.0-29.9, adult: Secondary | ICD-10-CM | POA: Diagnosis not present

## 2020-03-08 DIAGNOSIS — H209 Unspecified iridocyclitis: Secondary | ICD-10-CM | POA: Diagnosis not present

## 2020-03-08 DIAGNOSIS — M459 Ankylosing spondylitis of unspecified sites in spine: Secondary | ICD-10-CM | POA: Diagnosis not present

## 2020-03-08 DIAGNOSIS — E663 Overweight: Secondary | ICD-10-CM | POA: Diagnosis not present

## 2020-03-08 DIAGNOSIS — Z008 Encounter for other general examination: Secondary | ICD-10-CM | POA: Diagnosis not present

## 2020-03-08 DIAGNOSIS — F419 Anxiety disorder, unspecified: Secondary | ICD-10-CM | POA: Diagnosis not present

## 2020-03-08 DIAGNOSIS — I10 Essential (primary) hypertension: Secondary | ICD-10-CM | POA: Diagnosis not present

## 2020-03-08 DIAGNOSIS — G2581 Restless legs syndrome: Secondary | ICD-10-CM | POA: Diagnosis not present

## 2020-03-08 DIAGNOSIS — F331 Major depressive disorder, recurrent, moderate: Secondary | ICD-10-CM | POA: Diagnosis not present

## 2020-03-08 DIAGNOSIS — J449 Chronic obstructive pulmonary disease, unspecified: Secondary | ICD-10-CM | POA: Diagnosis not present

## 2020-03-08 DIAGNOSIS — Z0000 Encounter for general adult medical examination without abnormal findings: Secondary | ICD-10-CM | POA: Diagnosis not present

## 2020-03-15 ENCOUNTER — Telehealth: Payer: Self-pay | Admitting: Physician Assistant

## 2020-03-15 NOTE — Telephone Encounter (Signed)
Patient called stating she was unable to sleep last night due to "restless legs."  Patient states her elbows are also swollen and the left one might need to be drained.  Patient states she needs to be seen this afternoon or tomorrow and requesting a return call.

## 2020-03-15 NOTE — Progress Notes (Signed)
Office Visit Note  Patient: Kirsten Harper             Date of Birth: 27-Nov-1945           MRN: [id number]             PCP: Venia Carbon, MD Referring: Venia Carbon, MD Visit Date: 03/16/2020 Occupation: @GUAROCC @  Subjective:  Other (left elbow)   History of Present Illness: Kirsten Harper is a 74 y.o. female with history of ankylosing spondylitis, osteoarthritis and uveitis.  She states she has been under a lot of stress as she lost her stepfather and she has been taking care of 2 disabled people.  She has been having increased pain and swelling in her left elbow joint due to recurrence of bursitis.  She is also having pain and discomfort in multiple other joints.  She has been taking Humira every other week as a schedule.  She states her uveitis occurs off and on.  Activities of Daily Living:  Patient reports morning stiffness for 0  minutes.   Patient Reports nocturnal pain.  Difficulty dressing/grooming: Denies Difficulty climbing stairs: Denies Difficulty getting out of chair: Denies Difficulty using hands for taps, buttons, cutlery, and/or writing: Reports  Review of Systems  Constitutional: Positive for fatigue.  HENT: Positive for mouth dryness. Negative for mouth sores and nose dryness.   Eyes: Negative for itching and dryness.  Respiratory: Negative for shortness of breath and difficulty breathing.   Cardiovascular: Negative for chest pain and palpitations.  Gastrointestinal: Positive for constipation. Negative for blood in stool and diarrhea.  Endocrine: Negative for increased urination.  Genitourinary: Negative for difficulty urinating.  Musculoskeletal: Positive for arthralgias, joint pain, joint swelling and morning stiffness. Negative for myalgias, muscle tenderness and myalgias.  Skin: Positive for ulcers. Negative for color change and rash.  Allergic/Immunologic: Negative for susceptible to infections.  Neurological: Negative for dizziness, headaches,  memory loss and weakness.  Hematological: Negative for bruising/bleeding tendency.  Psychiatric/Behavioral: Positive for sleep disturbance. Negative for confusion.    PMFS History:  Patient Active Problem List   Diagnosis Date Noted  . Colitis 07/28/2018  . Preventative health care 05/04/2018  . Advance directive discussed with patient 05/04/2018  . RLS (restless legs syndrome) 04/25/2017  . Mood disorder ([place]) 04/08/2017  . Sensory loss 04/08/2017  . COPD (chronic obstructive pulmonary disease) ([place]) 04/08/2017  . Ankylosing spondylitis of lumbosacral region ([place]) 09/24/2016  . GAD (generalized anxiety disorder) 08/27/2016  . 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 Spondyloarthropathy HLA-B27 positive 06/06/2016  . Iritis 06/06/2016  . High risk medication use 06/06/2016  . Primary osteoarthritis of both hands 06/06/2016  . Smoker 06/06/2016  . COPD exacerbation ([place]) 04/10/2016  . Anxiety 04/10/2016  . Essential hypertension     Past Medical History:  Diagnosis Date  . Ankylosing spondylitis ([place])   . Anxiety   . Arthritis   . Chronic lower back pain   . CKD (chronic kidney disease), stage II    "related to the Ankylosing spondylitis" (04/10/2016)  . COPD (chronic obstructive pulmonary disease) ([place])   . Eye disease   . Hypertension   . PONV (postoperative nausea and vomiting)    "didn't bother me the last time I had it in ~ 2015"    Family History  Problem Relation Age of Onset  . Hypertension Mother   . Heart disease Sister   . Diabetes Sister   . Pulmonary fibrosis Brother   .  Idiopathic pulmonary fibrosis Brother   . Alzheimer's disease Brother   . Dementia Brother    Past Surgical History:  Procedure Laterality Date  . BROW LIFT AND BLEPHAROPLASTY Bilateral ~ 2015  . CATARACT EXTRACTION W/ INTRAOCULAR LENS  IMPLANT, BILATERAL Bilateral 2000s  . CYST REMOVAL TRUNK  12/2018  . MANDIBLE SURGERY  ~ 2000   "jaw had moved to left; broke  my jaw in 5 places; put titanium in; stripped muscle from bone; wired jaw shut"  . TUBAL LIGATION  1979  . VAGINAL HYSTERECTOMY  1985   Social History   Social History Narrative   2 sons--not in area   Lives on property with her mother Kirsten Harper      Has living will   Would want one of her sons, Kirsten Harper, to be Media planner   Requests DNR   No tube feeds   Immunization History  Administered Date(s) Administered  . Influenza Inj Mdck Quad Pf 04/16/2018  . Influenza, High Dose Seasonal PF 04/09/2019, 02/22/2020  . Influenza-Unspecified 04/09/2019  . Moderna SARS-COVID-2 Vaccination 09/06/2019, 10/05/2019  . Pneumococcal Conjugate-13 05/10/2013  . Pneumococcal Polysaccharide-23 09/20/2014  . Tdap 04/30/2011  . Zoster Recombinat (Shingrix) 10/01/2016     Objective: Vital Signs: BP (!) 144/84 (BP Location: Left Arm, Patient Position: Sitting, Cuff Size: Normal)   Pulse 98   Resp 17   Ht 5' 7.5" (1.715 m)   Wt 190 lb 3.2 oz (86.3 kg)   BMI 29.35 kg/m    Physical Exam Vitals and nursing note reviewed.  Constitutional:      Appearance: She is well-developed.  HENT:     Head: Normocephalic and atraumatic.  Eyes:     Conjunctiva/sclera: Conjunctivae normal.  Cardiovascular:     Rate and Rhythm: Normal rate and regular rhythm.     Heart sounds: Normal heart sounds.  Pulmonary:     Effort: Pulmonary effort is normal.     Breath sounds: Normal breath sounds.  Abdominal:     General: Bowel sounds are normal.     Palpations: Abdomen is soft.  Musculoskeletal:     Cervical back: Normal range of motion.  Lymphadenopathy:     Cervical: No cervical adenopathy.  Skin:    General: Skin is warm and dry.     Capillary Refill: Capillary refill takes less than 2 seconds.  Neurological:     Mental Status: She is alert and oriented to person, place, and time.  Psychiatric:        Behavior: Behavior normal.      Musculoskeletal Exam: C-spine was in good range of motion.  She  had painful limited range of motion of lumbar spine.  Shoulder joints, elbow joints, wrist joints with good range of motion.  She had left olecranon bursitis.  She has subcutaneous nodule over bilateral elbows.  MCP thickening with no synovitis was noted.  DIP and PIP thickening was noted.  Hip joints and knee joints were in good range of motion with no synovitis.  There was no evidence of Achilles tendinitis or plantar fasciitis.  CDAI Exam: CDAI Score: -- Patient Global: --; Provider Global: -- Swollen: --; Tender: -- Joint Exam 03/16/2020   No joint exam has been documented for this visit   There is currently no information documented on the homunculus. Go to the Rheumatology activity and complete the homunculus joint exam.  Investigation: No additional findings.  Imaging: No results found.  Recent Labs: Lab Results  Component Value Date  WBC 6.0 12/23/2019   HGB 14.8 12/23/2019   PLT 200 12/23/2019   NA 134 (L) 12/23/2019   K 3.9 12/23/2019   CL 99 12/23/2019   CO2 26 12/23/2019   GLUCOSE 98 12/23/2019   BUN 8 12/23/2019   CREATININE 0.87 12/23/2019   BILITOT 1.0 12/23/2019   ALKPHOS 98 07/27/2018   AST 21 12/23/2019   ALT 25 12/23/2019   PROT 7.0 12/23/2019   ALBUMIN 4.3 08/03/2018   CALCIUM 9.6 12/23/2019   GFRAA 77 12/23/2019   QFTBGOLDPLUS NEGATIVE 12/23/2019    Speciality Comments: No specialty comments available.  Procedures:  Medium Joint Inj: L olecranon bursa on 03/16/2020 4:37 PM Indications: pain Details: 27 G 1.5 in needle, posterior approach Medications: 1 mL lidocaine 1 % Aspirate: 13.5 mL Outcome: tolerated well, no immediate complications Procedure, treatment alternatives, risks and benefits explained, specific risks discussed. Consent was given by the patient. Immediately prior to procedure a time out was called to verify the correct patient, procedure, equipment, support staff and site/side marked as required. Patient was prepped and draped in  the usual sterile fashion.     Allergies: Amoxapine and related, Seroquel [quetiapine], and Other   Assessment / Plan:     Visit Diagnoses: Ankylosing spondylitis of lumbosacral region (HCC)-she continues to have pain and discomfort in her lower back.  She states she been taking Humira on a regular basis.  Her life is quite stressful currently as she has been taking care of 2 disabled people.  She also had a death in her family.  She states she has been flaring recently with increased pain and discomfort in her left elbow.  High risk medication use - Humira 40 mg sq injections every 14 days.  Labs are stable.  We will continue to monitor labs every 3 months.  She will come back in a week for her labs.  TB gold was normal on December 23, 2019.  HLA B 27 Positive   Olecranon bursitis of left elbow -she has bursitis of her left elbow joint.  No redness or warmth was noted.  Olecranon bursa was drained and sent for testing.  Once the results are negative we can inject with cortisone.  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  I do not have notes from her ophthalmologist.  Chronic midline low back pain without sciatica - Severe narrowing between L3-4 and L4-5 was noted.  L4-5 spondylolisthesis was noted.  No vertebral fracture was noted.  She continues to have lower back pain.  Primary osteoarthritis of both hands-joint protection muscle strengthening was discussed.  Primary osteoarthritis of both knees - s/p cortisone injections, bilateral knees 01/07/2020  History of hypertension-her blood pressure is mildly elevated.  She states is related to stress.  I advised her to monitor blood pressure closely.  History of anxiety  History of COPD  Former smoker  She has had both Covid vaccines.  Use of mask, social distancing and hand hygiene was discussed.  She was also advised to get third vaccine.  Instructions were  placed in the AVS.  Orders: Orders Placed This Encounter  Procedures  . Medium Joint Inj  . Anaerobic and Aerobic Culture  . Synovial cell count + diff, w/ crystals   No orders of the defined types were placed in this encounter.   Follow-Up Instructions: Return in about 5 days (around 03/21/2020) for Ankylosing spondylitis.   Bo Merino, MD  Note - This record has been created using [employer].  Chart creation errors have been sought, but may not always  have been located. Such creation errors do not reflect on  the standard of medical care.

## 2020-03-15 NOTE — Telephone Encounter (Signed)
Spoke with patient and scheduled for 03/16/2020 at 3:30 pm. Patient advised to contact her PCP for the RLS as he is the one who treats and prescribes medication for it. Patient verbalized understanding.

## 2020-03-16 ENCOUNTER — Encounter: Payer: Self-pay | Admitting: Rheumatology

## 2020-03-16 ENCOUNTER — Other Ambulatory Visit: Payer: Self-pay

## 2020-03-16 ENCOUNTER — Ambulatory Visit (INDEPENDENT_AMBULATORY_CARE_PROVIDER_SITE_OTHER): Payer: Medicare Other | Admitting: Rheumatology

## 2020-03-16 VITALS — BP 144/84 | HR 98 | Resp 17 | Ht 67.5 in | Wt 190.2 lb

## 2020-03-16 DIAGNOSIS — M170 Bilateral primary osteoarthritis of knee: Secondary | ICD-10-CM | POA: Diagnosis not present

## 2020-03-16 DIAGNOSIS — Z8709 Personal history of other diseases of the respiratory system: Secondary | ICD-10-CM | POA: Diagnosis not present

## 2020-03-16 DIAGNOSIS — G8929 Other chronic pain: Secondary | ICD-10-CM

## 2020-03-16 DIAGNOSIS — Z79899 Other long term (current) drug therapy: Secondary | ICD-10-CM

## 2020-03-16 DIAGNOSIS — M457 Ankylosing spondylitis of lumbosacral region: Secondary | ICD-10-CM | POA: Diagnosis not present

## 2020-03-16 DIAGNOSIS — M19041 Primary osteoarthritis, right hand: Secondary | ICD-10-CM

## 2020-03-16 DIAGNOSIS — M47819 Spondylosis without myelopathy or radiculopathy, site unspecified: Secondary | ICD-10-CM

## 2020-03-16 DIAGNOSIS — Z8659 Personal history of other mental and behavioral disorders: Secondary | ICD-10-CM

## 2020-03-16 DIAGNOSIS — M545 Low back pain, unspecified: Secondary | ICD-10-CM

## 2020-03-16 DIAGNOSIS — M7022 Olecranon bursitis, left elbow: Secondary | ICD-10-CM

## 2020-03-16 DIAGNOSIS — H209 Unspecified iridocyclitis: Secondary | ICD-10-CM

## 2020-03-16 DIAGNOSIS — Z87891 Personal history of nicotine dependence: Secondary | ICD-10-CM

## 2020-03-16 DIAGNOSIS — Z8679 Personal history of other diseases of the circulatory system: Secondary | ICD-10-CM | POA: Diagnosis not present

## 2020-03-16 DIAGNOSIS — M19042 Primary osteoarthritis, left hand: Secondary | ICD-10-CM

## 2020-03-16 MED ORDER — LIDOCAINE HCL 1 % IJ SOLN
1.0000 mL | INTRAMUSCULAR | Status: AC | PRN
  Administered 2020-03-16: 1 mL

## 2020-03-16 NOTE — Patient Instructions (Addendum)
Standing Labs We placed an order today for your standing lab work.   Please have your standing labs drawn in 1 week and then every 3 months  If possible, please have your labs drawn 2 weeks prior to your appointment so that the provider can discuss your results at your appointment.  We have open lab daily Monday through Thursday from 8:30-12:30 PM and 1:30-4:30 PM and Friday from 8:30-12:30 PM and 1:30-4:00 PM at the office of Dr. Bo Merino, [facility].   Please be advised, patients with office appointments requiring lab work will take precedents over walk-in lab work.  If possible, please come for your lab work on Monday and Friday afternoons, as you may experience shorter wait times. The office is located at [address] [place], [place], [place] [address] No appointment is necessary.   Labs are drawn by [facility]. Please bring your co-pay at the time of your lab draw.  You may receive a bill from [facility] for your lab work.  If you wish to have your labs drawn at another location, please call the office 24 hours in advance to send orders.  If you have any questions regarding directions or hours of operation,  please call [phone].   As a reminder, please drink plenty of water prior to coming for your lab work. Thanks!  COVID-19 vaccine recommendations:   COVID-19 vaccine is recommended for everyone (unless you are allergic to a vaccine component), even if you are on a medication that suppresses your immune system.   If you are on Methotrexate, Cellcept (mycophenolate), Rinvoq, Morrie Sheldon, and Olumiant- hold the medication for 1 week after each vaccine. Hold Methotrexate for 2 weeks after the single dose COVID-19 vaccine.   If you are on Orencia subcutaneous injection - hold medication one week prior to and one week after the first COVID-19 vaccine dose (only).   If you are on Orencia IV infusions- time vaccination administration so that the first COVID-19  vaccination will occur four weeks after the infusion and postpone the subsequent infusion by one week.   If you are on Cyclophosphamide or Rituxan infusions please contact your doctor prior to receiving the COVID-19 vaccine.   Do not take Tylenol or any anti-inflammatory medications (NSAIDs) 24 hours prior to the COVID-19 vaccination.   There is no direct evidence about the efficacy of the COVID-19 vaccine in individuals who are on medications that suppress the immune system.   Even if you are fully vaccinated, and you are on any medications that suppress your immune system, please continue to wear a mask, maintain at least six feet social distance and practice hand hygiene.   If you develop a COVID-19 infection, please contact your PCP or our office to determine if you need antibody infusion.  The booster vaccine is now available for immunocompromised patients. It is advised that if you had Pfizer vaccine you should get [employer] booster.  If you had a Moderna vaccine then you should get a Moderna booster. Johnson and Wynetta Emery does not have a booster vaccine at this time.  Please see the following web sites for updated information.   [email or web address]-Patient-Resources.[email or web address]  [email or web address]

## 2020-03-17 NOTE — Progress Notes (Addendum)
Office Visit Note  Patient: Kirsten Harper             Date of Birth: 10/16/1945           MRN: [id number]             PCP: Venia Carbon, MD Referring: Venia Carbon, MD Visit Date: 03/21/2020 Occupation: _0 @  Subjective:  Medication management.   History of Present Illness: Kirsten Harper is a 74 y.o. female with history of ankylosing spondylitis, osteoarthritis and uveitis.  She has been taking Humira on a regular basis every other week.  She states she has been under a lot of stress as her mother is going through her terminal illness.  She also had a lot of stress from her neighbors.  She has noticed some swelling over her right wrist joint.  She states her eyes are still irritated.  She has an appointment coming up with Dr. Manuella Ghazi.  She denies any lower back pain.  She had her COVID-19 booster about 2 days ago.  She states she had bad reaction and had high fever after that which is getting better now.  She still have some redness on her arm from the Covid vaccination.  She was seen recently for aspiration of her left olecranon bursa.  The synovial fluid culture was negative.  Activities of Daily Living:  Patient reports morning stiffness for 24 hours.   Patient Denies nocturnal pain.  Difficulty dressing/grooming: Denies Difficulty climbing stairs: Denies Difficulty getting out of chair: Reports Difficulty using hands for taps, buttons, cutlery, and/or writing: Reports  Review of Systems  Constitutional: Positive for fatigue.  HENT: Positive for mouth dryness. Negative for mouth sores and nose dryness.   Eyes: Positive for dryness. Negative for pain and redness.  Respiratory: Negative for shortness of breath and difficulty breathing.   Cardiovascular: Negative for chest pain and palpitations.  Gastrointestinal: Positive for constipation. Negative for blood in stool and diarrhea.  Endocrine: Negative for increased urination.  Genitourinary: Negative for difficulty  urinating.  Musculoskeletal: Positive for arthralgias, joint pain, joint swelling, myalgias, morning stiffness and myalgias. Negative for muscle tenderness.  Skin: Negative for color change, rash and redness.  Allergic/Immunologic: Negative for susceptible to infections.  Neurological: Positive for memory loss and weakness. Negative for dizziness, numbness and headaches.  Hematological: Negative for bruising/bleeding tendency.  Psychiatric/Behavioral: Positive for depressed mood and sleep disturbance. The patient is nervous/anxious.     PMFS History:  Patient Active Problem List   Diagnosis Date Noted  . Colitis 07/28/2018  . Preventative health care 05/04/2018  . Advance directive discussed with patient 05/04/2018  . RLS (restless legs syndrome) 04/25/2017  . Mood disorder ([place]) 04/08/2017  . Sensory loss 04/08/2017  . COPD (chronic obstructive pulmonary disease) ([place]) 04/08/2017  . Ankylosing spondylitis of lumbosacral region ([place]) 09/24/2016  . GAD (generalized anxiety disorder) 08/27/2016  . 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 Spondyloarthropathy HLA-B27 positive 06/06/2016  . Iritis 06/06/2016  . High risk medication use 06/06/2016  . Primary osteoarthritis of both hands 06/06/2016  . Smoker 06/06/2016  . COPD exacerbation ([place]) 04/10/2016  . Anxiety 04/10/2016  . Essential hypertension     Past Medical History:  Diagnosis Date  . Ankylosing spondylitis ([place])   . Anxiety   . Arthritis   . Chronic lower back pain   . CKD (chronic kidney disease), stage II    "related to the Ankylosing spondylitis" (04/10/2016)  . COPD (chronic obstructive  pulmonary disease) ([place])   . Eye disease   . Hypertension   . PONV (postoperative nausea and vomiting)    "didn't bother me the last time I had it in ~ 2015"    Family History  Problem Relation Age of Onset  . Hypertension Mother   . Heart disease Sister   . Diabetes Sister   . Pulmonary fibrosis  Brother   . Idiopathic pulmonary fibrosis Brother   . Alzheimer's disease Brother   . Dementia Brother    Past Surgical History:  Procedure Laterality Date  . BROW LIFT AND BLEPHAROPLASTY Bilateral ~ 2015  . CATARACT EXTRACTION W/ INTRAOCULAR LENS  IMPLANT, BILATERAL Bilateral 2000s  . CYST REMOVAL TRUNK  12/2018  . MANDIBLE SURGERY  ~ 2000   "jaw had moved to left; broke my jaw in 5 places; put titanium in; stripped muscle from bone; wired jaw shut"  . TUBAL LIGATION  1979  . VAGINAL HYSTERECTOMY  1985   Social History   Social History Narrative   2 sons--not in area   Lives on property with her mother [name]      Has living will   Would want one of her sons, [name], to be Media planner   Requests DNR   No tube feeds   Immunization History  Administered Date(s) Administered  . Influenza Inj Mdck Quad Pf 04/16/2018  . Influenza, High Dose Seasonal PF 04/09/2019, 02/22/2020  . Influenza-Unspecified 04/09/2019  . Moderna SARS-COVID-2 Vaccination 09/06/2019, 10/05/2019, 03/19/2020  . Pneumococcal Conjugate-13 05/10/2013  . Pneumococcal Polysaccharide-23 09/20/2014  . Tdap 04/30/2011  . Zoster Recombinat (Shingrix) 10/01/2016     Objective: Vital Signs: BP (!) 152/88 (BP Location: Left Arm, Patient Position: Sitting, Cuff Size: Normal)   Pulse 89   Resp 16   Ht 5' 7.5" (1.715 m)   Wt 189 lb 3.2 oz (85.8 kg)   BMI 29.20 kg/m    Physical Exam Vitals and nursing note reviewed.  Constitutional:      Appearance: She is well-developed.  HENT:     Head: Normocephalic and atraumatic.  Eyes:     Conjunctiva/sclera: Conjunctivae normal.  Cardiovascular:     Rate and Rhythm: Normal rate and regular rhythm.     Heart sounds: Normal heart sounds.  Pulmonary:     Effort: Pulmonary effort is normal.     Breath sounds: Normal breath sounds.  Abdominal:     General: Bowel sounds are normal.     Palpations: Abdomen is soft.  Musculoskeletal:     Cervical back: Normal  range of motion.  Lymphadenopathy:     Cervical: No cervical adenopathy.  Skin:    General: Skin is warm and dry.     Capillary Refill: Capillary refill takes less than 2 seconds.  Neurological:     Mental Status: She is alert and oriented to person, place, and time.  Psychiatric:        Behavior: Behavior normal.      Musculoskeletal Exam: C-spine was in good range of motion.  She had good range of motion of her shoulder joints.  She has left olecranon bursitis.  She has 2 subcutaneous nodules on her elbows which raises suspicion of rheumatoid arthritis.  She has synovitis over some of her MCPs and wrist joints.  Knee joints, ankles, MTPs with good range of motion with no synovitis.  CDAI Exam: CDAI Score: 15  Patient Global: 5 mm; Provider Global: 5 mm Swollen: 7 ; Tender: 7  Joint Exam  03/21/2020      Right  Left  Elbow     Swollen Tender  Wrist  Swollen Tender     MCP 2  Swollen Tender  Swollen Tender  MCP 3  Swollen Tender  Swollen Tender  MCP 5  Swollen Tender        Investigation: No additional findings.  Imaging: No results found.  Recent Labs: Lab Results  Component Value Date   WBC 6.0 12/23/2019   HGB 14.8 12/23/2019   PLT 200 12/23/2019   NA 134 (L) 12/23/2019   K 3.9 12/23/2019   CL 99 12/23/2019   CO2 26 12/23/2019   GLUCOSE 98 12/23/2019   BUN 8 12/23/2019   CREATININE 0.87 12/23/2019   BILITOT 1.0 12/23/2019   ALKPHOS 98 07/27/2018   AST 21 12/23/2019   ALT 25 12/23/2019   PROT 7.0 12/23/2019   ALBUMIN 4.3 08/03/2018   CALCIUM 9.6 12/23/2019   GFRAA 77 12/23/2019   QFTBGOLDPLUS NEGATIVE 12/23/2019  March 16, 2020 synovial fluid WBC count 1220 neutrophils 32%, crystals negative, culture negative.  Gram stain negative.  Speciality Comments: No specialty comments available.  Procedures:  Medium Joint Inj: L olecranon bursa on 03/21/2020 11:48 AM Indications: pain Details: 27 G 1.5 in needle, posterior approach Medications: 1 mL lidocaine  1 %; 30 mg triamcinolone acetonide 40 MG/ML Aspirate: 10 mL serous Outcome: tolerated well, no immediate complications Procedure, treatment alternatives, risks and benefits explained, specific risks discussed. Consent was given by the patient. Immediately prior to procedure a time out was called to verify the correct patient, procedure, equipment, support staff and site/side marked as required. Patient was prepped and draped in the usual sterile fashion.     Allergies: Amoxapine and related, Seroquel [quetiapine], and Other   Assessment / Plan:     Visit Diagnoses: Ankylosing spondylitis of lumbosacral region (HCC)-patient states that she has been taking Humira on a regular basis.  She denies any SI joint pain.  She had synovitis in several of her MCPs and her right wrist joint.  She also had left olecranon bursitis.  I wonder if she has an overlap with rheumatoid arthritis.  She had palpable nodule over her bilateral elbows.  We had detailed discussion regarding adding medications.  She has taken methotrexate in the past.  She declined methotrexate.  She states she had a lot of nausea and did not want to take the medication again.  After reviewing different treatment options and their side effects she was convinced to go on leflunomide.  Indications side effects contraindications of leflunomide were discussed.  My plan is to start on leflunomide 10 mg p.o. daily.  If tolerated and her labs are normal we will increase it to 20 mg p.o. daily.  Her labs were rechecked with weeks x2 and then every 3 months.  High risk medication use - Humira 40 mg sq injections every 14 days - Plan: CBC with Differential/Platelet, COMPLETE METABOLIC PANEL WITH GFR  HLA B 27 Positive   Olecranon bursitis of left elbow-synovial fluid was aspirated on September 2 and the culture is negative.  The synovial fluid was aspirated again today.  And injected with 30 mg of Kenalog which she tolerated well.  Pressure bandage was  applied.  Uveitis-patient states that she continues to have some eye problems.  She was told that she has mostly dry eyes.  Primary osteoarthritis of both hands-joint protection was discussed.  Primary osteoarthritis of both knees-she continues to have some knee joint  discomfort.  History of hypertension-blood pressure is elevated today.  Have advised her to monitor blood pressure closely.  She states she just took her blood pressure medication before coming to the office.  I also made her aware that leflunomide can cause hypertension and will need close monitoring.  History of anxiety-she is also under a lot of stress as her mother is terminal.  History of COPD  Former smoker-smoking cessation was discussed.  Orders: Orders Placed This Encounter  Procedures  . Medium Joint Inj  . CBC with Differential/Platelet  . COMPLETE METABOLIC PANEL WITH GFR   No orders of the defined types were placed in this encounter.    Follow-Up Instructions: Return in about 6 weeks (around 05/02/2020) for Ankylosing spondylitis.   Bo Merino, MD  Note - This record has been created using Editor, commissioning.  Chart creation errors have been sought, but may not always  have been located. Such creation errors do not reflect on  the standard of medical care.

## 2020-03-18 DIAGNOSIS — Z23 Encounter for immunization: Secondary | ICD-10-CM | POA: Diagnosis not present

## 2020-03-21 ENCOUNTER — Ambulatory Visit (INDEPENDENT_AMBULATORY_CARE_PROVIDER_SITE_OTHER): Payer: Medicare Other | Admitting: Rheumatology

## 2020-03-21 ENCOUNTER — Other Ambulatory Visit: Payer: Self-pay

## 2020-03-21 ENCOUNTER — Encounter: Payer: Self-pay | Admitting: Rheumatology

## 2020-03-21 VITALS — BP 152/88 | HR 89 | Resp 16 | Ht 67.5 in | Wt 189.2 lb

## 2020-03-21 DIAGNOSIS — Z8659 Personal history of other mental and behavioral disorders: Secondary | ICD-10-CM | POA: Diagnosis not present

## 2020-03-21 DIAGNOSIS — M170 Bilateral primary osteoarthritis of knee: Secondary | ICD-10-CM | POA: Diagnosis not present

## 2020-03-21 DIAGNOSIS — Z8679 Personal history of other diseases of the circulatory system: Secondary | ICD-10-CM

## 2020-03-21 DIAGNOSIS — Z79899 Other long term (current) drug therapy: Secondary | ICD-10-CM | POA: Diagnosis not present

## 2020-03-21 DIAGNOSIS — Z87891 Personal history of nicotine dependence: Secondary | ICD-10-CM

## 2020-03-21 DIAGNOSIS — M457 Ankylosing spondylitis of lumbosacral region: Secondary | ICD-10-CM | POA: Diagnosis not present

## 2020-03-21 DIAGNOSIS — Z8709 Personal history of other diseases of the respiratory system: Secondary | ICD-10-CM

## 2020-03-21 DIAGNOSIS — M19042 Primary osteoarthritis, left hand: Secondary | ICD-10-CM | POA: Diagnosis not present

## 2020-03-21 DIAGNOSIS — H209 Unspecified iridocyclitis: Secondary | ICD-10-CM

## 2020-03-21 DIAGNOSIS — M7022 Olecranon bursitis, left elbow: Secondary | ICD-10-CM

## 2020-03-21 DIAGNOSIS — M19041 Primary osteoarthritis, right hand: Secondary | ICD-10-CM | POA: Diagnosis not present

## 2020-03-21 DIAGNOSIS — M47819 Spondylosis without myelopathy or radiculopathy, site unspecified: Secondary | ICD-10-CM | POA: Diagnosis not present

## 2020-03-21 LAB — COMPLETE METABOLIC PANEL WITH GFR
AG Ratio: 1.5 (calc) (ref 1.0–2.5)
ALT: 22 U/L (ref 6–29)
AST: 24 U/L (ref 10–35)
Albumin: 4.4 g/dL (ref 3.6–5.1)
Alkaline phosphatase (APISO): 76 U/L (ref 37–153)
BUN: 10 mg/dL (ref 7–25)
CO2: 28 mmol/L (ref 20–32)
Calcium: 10 mg/dL (ref 8.6–10.4)
Chloride: 97 mmol/L — ABNORMAL LOW (ref 98–110)
Creat: 0.82 mg/dL (ref 0.60–0.93)
GFR, Est African American: 83 mL/min/{1.73_m2} (ref >=60)
GFR, Est Non African American: 71 mL/min/{1.73_m2} (ref >=60)
Globulin: 3 g/dL (calc) (ref 1.9–3.7)
Glucose, Bld: 103 mg/dL — ABNORMAL HIGH (ref 65–99)
Potassium: 4.2 mmol/L (ref 3.5–5.3)
Sodium: 132 mmol/L — ABNORMAL LOW (ref 135–146)
Total Bilirubin: 1.3 mg/dL — ABNORMAL HIGH (ref 0.2–1.2)
Total Protein: 7.4 g/dL (ref 6.1–8.1)

## 2020-03-21 LAB — CBC WITH DIFFERENTIAL/PLATELET
Absolute Monocytes: 805 cells/uL (ref 200–950)
Basophils Absolute: 33 cells/uL (ref 0–200)
Basophils Relative: 0.5 %
Eosinophils Absolute: 449 cells/uL (ref 15–500)
Eosinophils Relative: 6.8 %
HCT: 43.3 % (ref 35.0–45.0)
Hemoglobin: 15.1 g/dL (ref 11.7–15.5)
Lymphs Abs: 1346 cells/uL (ref 850–3900)
MCH: 33.1 pg — ABNORMAL HIGH (ref 27.0–33.0)
MCHC: 34.9 g/dL (ref 32.0–36.0)
MCV: 95 fL (ref 80.0–100.0)
MPV: 11 fL (ref 7.5–12.5)
Monocytes Relative: 12.2 %
Neutro Abs: 3967 cells/uL (ref 1500–7800)
Neutrophils Relative %: 60.1 %
Platelets: 205 10*3/uL (ref 140–400)
RBC: 4.56 10*6/uL (ref 3.80–5.10)
RDW: 12.2 % (ref 11.0–15.0)
Total Lymphocyte: 20.4 %
WBC: 6.6 10*3/uL (ref 3.8–10.8)

## 2020-03-21 MED ORDER — LIDOCAINE HCL 1 % IJ SOLN
1.0000 mL | INTRAMUSCULAR | Status: AC | PRN
  Administered 2020-03-21: 1 mL

## 2020-03-21 MED ORDER — TRIAMCINOLONE ACETONIDE 40 MG/ML IJ SUSP
30.0000 mg | INTRAMUSCULAR | Status: AC | PRN
  Administered 2020-03-21: 30 mg via INTRA_ARTICULAR

## 2020-03-21 MED ORDER — LEFLUNOMIDE 10 MG PO TABS: ORAL_TABLET | ORAL | 0 refills | Status: DC

## 2020-03-21 NOTE — Progress Notes (Signed)
Initial cultures are negative.

## 2020-03-21 NOTE — Patient Instructions (Addendum)
Leflunomide tablets What is this medicine? LEFLUNOMIDE (le FLOO na mide) is for rheumatoid arthritis. This medicine may be used for other purposes; ask your health care provider or pharmacist if you have questions. COMMON BRAND NAME(S): Arava What should I tell my health care provider before I take this medicine? They need to know if you have any of these conditions:  diabetes  have a fever or infection  high blood pressure  immune system problems  kidney disease  liver disease  low blood cell counts, like low [place] cell, platelet, or red cell counts  lung or breathing disease, like asthma  recently received or scheduled to receive a vaccine  receiving treatment for cancer  skin conditions or sensitivity  tingling of the fingers or toes, or other nerve disorder  tuberculosis  an unusual or allergic reaction to leflunomide, teriflunomide, other medicines, food, dyes, or preservatives  pregnant or trying to get pregnant  breast-feeding How should I use this medicine? Take this medicine by mouth with a full glass of water. Follow the directions on the prescription label. Take your medicine at regular intervals. Do not take your medicine more often than directed. Do not stop taking except on your doctor's advice. Talk to your pediatrician regarding the use of this medicine in children. Special care may be needed. Overdosage: If you think you have taken too much of this medicine contact a poison control center or emergency room at once. NOTE: This medicine is only for you. Do not share this medicine with others. What if I miss a dose? If you miss a dose, take it as soon as you can. If it is almost time for your next dose, take only that dose. Do not take double or extra doses. What may interact with this medicine? Do not take this medicine with any of the following medications:  teriflunomide This medicine may also interact with the following  medications:  alosetron  birth control pills  caffeine  cefaclor  certain medicines for diabetes like nateglinide, repaglinide, rosiglitazone, pioglitazone  certain medicines for high cholesterol like atorvastatin, pravastatin, rosuvastatin, simvastatin  charcoal  cholestyramine  ciprofloxacin  duloxetine  furosemide  ketoprofen  live virus vaccines  medicines that increase your risk for infection  methotrexate  mitoxantrone  paclitaxel  penicillin  theophylline  tizanidine  warfarin This list may not describe all possible interactions. Give your health care provider a list of all the medicines, herbs, non-prescription drugs, or dietary supplements you use. Also tell them if you smoke, drink alcohol, or use illegal drugs. Some items may interact with your medicine. What should I watch for while using this medicine? Visit your health care provider for regular checks on your progress. Tell your doctor or health care provider if your symptoms do not start to get better or if they get worse. You may need blood work done while you are taking this medicine. This medicine may cause serious skin reactions. They can happen weeks to months after starting the medicine. Contact your health care provider right away if you notice fevers or flu-like symptoms with a rash. The rash may be red or purple and then turn into blisters or peeling of the skin. Or, you might notice a red rash with swelling of the face, lips or lymph nodes in your neck or under your arms. This medicine may stay in your body for up to 2 years after your last dose. Tell your doctor about any unusual side effects or symptoms. A medicine can  be given to help lower your blood levels of this medicine more quickly. Women must use effective birth control with this medicine. There is a potential for serious side effects to an unborn child. Do not become pregnant while taking this medicine. Inform your doctor if you wish  to become pregnant. This medicine remains in your blood after you stop taking it. You must continue using effective birth control until the blood levels have been checked and they are low enough. A medicine can be given to help lower your blood levels of this medicine more quickly. Immediately talk to your doctor if you think you may be pregnant. You may need a pregnancy test. Talk to your health care provider or pharmacist for more information. You should not receive certain vaccines during your treatment and for a certain time after your treatment with this medication ends. Talk to your health care provider for more information. What side effects may I notice from receiving this medicine? Side effects that you should report to your doctor or health care professional as soon as possible:  allergic reactions like skin rash, itching or hives, swelling of the face, lips, or tongue  breathing problems  cough  increased blood pressure  low blood counts - this medicine may decrease the number of [place] blood cells and platelets. You may be at increased risk for infections and bleeding.  pain, tingling, numbness in the hands or feet  rash, fever, and swollen lymph nodes  redness, blistering, peeing or loosening of the skin, including inside the mouth  signs of decreased platelets or bleeding - bruising, pinpoint red spots on the skin, black, tarry stools, blood in urine  signs of infection - fever or chills, cough, sore throat, pain or trouble passing urine  signs and symptoms of liver injury like dark yellow or brown urine; general ill feeling or flu-like symptoms; light-colored stools; loss of appetite; nausea; right upper belly pain; unusually weak or tired; yellowing of the eyes or skin  trouble passing urine or change in the amount of urine  vomiting Side effects that usually do not require medical attention (report to your doctor or health care professional if they continue or are  bothersome):  diarrhea  hair thinning or loss  headache  nausea  tiredness This list may not describe all possible side effects. Call your doctor for medical advice about side effects. You may report side effects to FDA at 1-800-FDA-1088. Where should I keep my medicine? Keep out of the reach of children. Store at room temperature between 15 and 30 degrees C (59 and 86 degrees F). Protect from moisture and light. Throw away any unused medicine after the expiration date. NOTE: This sheet is a summary. It may not cover all possible information. If you have questions about this medicine, talk to your doctor, pharmacist, or health care provider.  2020 Elsevier/Gold Standard (2018-10-02 15:06:48)  Standing Labs We placed an order today for your standing lab work.   Please have your standing labs drawn in 2 weeks x2, 2 months and then every 3 months.    If possible, please have your labs drawn 2 weeks prior to your appointment so that the provider can discuss your results at your appointment.  We have open lab daily Monday through Thursday from 8:30-12:30 PM and 1:30-4:30 PM and Friday from 8:30-12:30 PM and 1:30-4:00 PM at the office of Dr. Bo Merino, [facility].   Please be advised, patients with office appointments requiring lab work will  take precedents over walk-in lab work.  If possible, please come for your lab work on Monday and Friday afternoons, as you may experience shorter wait times. The office is located at [address] [place], [place], [place] [address] No appointment is necessary.   Labs are drawn by [facility]. Please bring your co-pay at the time of your lab draw.  You may receive a bill from [facility] for your lab work.  If you wish to have your labs drawn at another location, please call the office 24 hours in advance to send orders.  If you have any questions regarding directions or hours of operation,  please call [phone].   As a  reminder, please drink plenty of water prior to coming for your lab work. Thanks!     COVID-19 vaccine recommendations:   COVID-19 vaccine is recommended for everyone (unless you are allergic to a vaccine component), even if you are on a medication that suppresses your immune system.   If you are on Methotrexate, Cellcept (mycophenolate), Rinvoq, Morrie Sheldon, and Olumiant- hold the medication for 1 week after each vaccine. Hold Methotrexate for 2 weeks after the single dose COVID-19 vaccine.   If you are on Orencia subcutaneous injection - hold medication one week prior to and one week after the first COVID-19 vaccine dose (only).   If you are on Orencia IV infusions- time vaccination administration so that the first COVID-19 vaccination will occur four weeks after the infusion and postpone the subsequent infusion by one week.   If you are on Cyclophosphamide or Rituxan infusions please contact your doctor prior to receiving the COVID-19 vaccine.   Do not take Tylenol or any anti-inflammatory medications (NSAIDs) 24 hours prior to the COVID-19 vaccination.   There is no direct evidence about the efficacy of the COVID-19 vaccine in individuals who are on medications that suppress the immune system.   Even if you are fully vaccinated, and you are on any medications that suppress your immune system, please continue to wear a mask, maintain at least six feet social distance and practice hand hygiene.   If you develop a COVID-19 infection, please contact your PCP or our office to determine if you need antibody infusion.  The booster vaccine is now available for immunocompromised patients. It is advised that if you had Pfizer vaccine you should get Coca-Cola booster.  If you had a Moderna vaccine then you should get a Moderna booster. Johnson and Wynetta Emery does not have a booster vaccine at this time.  Please see the following web sites for updated information.    [email or web address]://[email or web address]/[email or web address]-Vaccination-Patient-Resources.[email or web address]  [email or web address]/About-[email or web address]-Releases/ID/1159

## 2020-03-22 LAB — SYNOVIAL CELL COUNT + DIFF, W/ CRYSTALS
Basophils, %: 0 %
Eosinophils-Synovial: 0 % (ref 0–2)
Lymphocytes-Synovial Fld: 54 % (ref 0–74)
Monocyte/Macrophage: 14 % (ref 0–69)
Neutrophil, Synovial: 32 % — ABNORMAL HIGH (ref 0–24)
Synoviocytes, %: 0 % (ref 0–15)
WBC, Synovial: 1220 cells/uL — ABNORMAL HIGH (ref <150)

## 2020-03-22 LAB — ANAEROBIC AND AEROBIC CULTURE
AER RESULT:: NO GROWTH
MICRO NUMBER:: 10905019
MICRO NUMBER:: 10905020
SPECIMEN QUALITY:: ADEQUATE
SPECIMEN QUALITY:: ADEQUATE

## 2020-03-22 NOTE — Progress Notes (Signed)
CBC and CMP are stable.

## 2020-03-24 DIAGNOSIS — I10 Essential (primary) hypertension: Secondary | ICD-10-CM | POA: Diagnosis not present

## 2020-03-24 DIAGNOSIS — Z008 Encounter for other general examination: Secondary | ICD-10-CM | POA: Diagnosis not present

## 2020-03-24 DIAGNOSIS — Z0000 Encounter for general adult medical examination without abnormal findings: Secondary | ICD-10-CM | POA: Diagnosis not present

## 2020-03-24 DIAGNOSIS — E663 Overweight: Secondary | ICD-10-CM | POA: Diagnosis not present

## 2020-03-24 DIAGNOSIS — Z6828 Body mass index (BMI) 28.0-28.9, adult: Secondary | ICD-10-CM | POA: Diagnosis not present

## 2020-03-24 DIAGNOSIS — F419 Anxiety disorder, unspecified: Secondary | ICD-10-CM | POA: Diagnosis not present

## 2020-03-24 DIAGNOSIS — M459 Ankylosing spondylitis of unspecified sites in spine: Secondary | ICD-10-CM | POA: Diagnosis not present

## 2020-04-11 DIAGNOSIS — F331 Major depressive disorder, recurrent, moderate: Secondary | ICD-10-CM | POA: Diagnosis not present

## 2020-04-11 DIAGNOSIS — F411 Generalized anxiety disorder: Secondary | ICD-10-CM | POA: Diagnosis not present

## 2020-04-14 ENCOUNTER — Telehealth: Payer: Self-pay

## 2020-04-14 DIAGNOSIS — M457 Ankylosing spondylitis of lumbosacral region: Secondary | ICD-10-CM

## 2020-04-14 MED ORDER — HUMIRA (2 PEN) 40 MG/0.4ML ~~LOC~~ AJKT: 40.0000 mg | AUTO-INJECTOR | SUBCUTANEOUS | 0 refills | Status: DC

## 2020-04-14 NOTE — Telephone Encounter (Signed)
Last Visit: 03/21/2020 Next Visit: 05/04/2020 Labs: 03/21/2020 CBC and CMP are stable. TB Gold: 12/23/2019 Neg   Current Dose per office note 03/21/2020: Humira 40 mg sq injections every 14 days   DX: Ankylosing spondylitis of lumbosacral region   Okay to refill per Dr. Estanislado Pandy   Prescription sent to the pharmacy.

## 2020-04-14 NOTE — Telephone Encounter (Signed)
Patient called requesting prescription refill of Humira.  Patient states [facility] requests that the prescription be called in.  Patient states she took her last injection today and was told if the prescription is faxed it can take 48 hours before they process.

## 2020-04-19 ENCOUNTER — Telehealth: Payer: Self-pay | Admitting: *Deleted

## 2020-04-19 NOTE — Telephone Encounter (Signed)
Patient contacted the office asking who she should contact to see if her Humira is on the way. Patient advised her prescription was sent to the pharmacy and she should contact them to see when it will be shipped. Patient states she also recently had her elbows drained. Patient states she was advised to see Ortho put is unaware of who she can see. Patient advised she can see Marga Hoots and provided with phone number. Patient states she has two big knots above her elbows that need to be evaluated.

## 2020-04-20 DIAGNOSIS — F411 Generalized anxiety disorder: Secondary | ICD-10-CM | POA: Diagnosis not present

## 2020-04-20 DIAGNOSIS — F331 Major depressive disorder, recurrent, moderate: Secondary | ICD-10-CM | POA: Diagnosis not present

## 2020-04-21 NOTE — Progress Notes (Deleted)
Office Visit Note  Patient: Kirsten Harper             Date of Birth: [birth year]           MRN: [id number]             PCP: Venia Carbon, MD Referring: Venia Carbon, MD Visit Date: 05/04/2020 Occupation: @GUAROCC @  Subjective:  No chief complaint on file.   History of Present Illness: Kirsten Harper is a 74 y.o. female ***   Activities of Daily Living:  Patient reports morning stiffness for *** {minute/hour:19697}.   Patient {ACTIONS;DENIES/REPORTS:21021675::"Denies"} nocturnal pain.  Difficulty dressing/grooming: {ACTIONS;DENIES/REPORTS:21021675::"Denies"} Difficulty climbing stairs: {ACTIONS;DENIES/REPORTS:21021675::"Denies"} Difficulty getting out of chair: {ACTIONS;DENIES/REPORTS:21021675::"Denies"} Difficulty using hands for taps, buttons, cutlery, and/or writing: {ACTIONS;DENIES/REPORTS:21021675::"Denies"}  No Rheumatology ROS completed.   PMFS History:  Patient Active Problem List   Diagnosis Date Noted  . Colitis 07/28/2018  . Preventative health care 05/04/2018  . Advance directive discussed with patient 05/04/2018  . RLS (restless legs syndrome) 04/25/2017  . Mood disorder ([place]) 04/08/2017  . Sensory loss 04/08/2017  . COPD (chronic obstructive pulmonary disease) ([place]) 04/08/2017  . Ankylosing spondylitis of lumbosacral region ([place]) 09/24/2016  . GAD (generalized anxiety disorder) 08/27/2016  . 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 Spondyloarthropathy HLA-B27 positive 06/06/2016  . Iritis 06/06/2016  . High risk medication use 06/06/2016  . Primary osteoarthritis of both hands 06/06/2016  . Smoker 06/06/2016  . COPD exacerbation ([place]) 04/10/2016  . Anxiety 04/10/2016  . Essential hypertension     Past Medical History:  Diagnosis Date  . Ankylosing spondylitis ([place])   . Anxiety   . Arthritis   . Chronic lower back pain   . CKD (chronic kidney disease), stage II    "related to the Ankylosing spondylitis" (04/10/2016)  .  COPD (chronic obstructive pulmonary disease) ([place])   . Eye disease   . Hypertension   . PONV (postoperative nausea and vomiting)    "didn't bother me the last time I had it in ~ 2015"    Family History  Problem Relation Age of Onset  . Hypertension Mother   . Heart disease Sister   . Diabetes Sister   . Pulmonary fibrosis Brother   . Idiopathic pulmonary fibrosis Brother   . Alzheimer's disease Brother   . Dementia Brother    Past Surgical History:  Procedure Laterality Date  . BROW LIFT AND BLEPHAROPLASTY Bilateral ~ 2015  . CATARACT EXTRACTION W/ INTRAOCULAR LENS  IMPLANT, BILATERAL Bilateral 2000s  . CYST REMOVAL TRUNK  12/2018  . MANDIBLE SURGERY  ~ 2000   "jaw had moved to left; broke my jaw in 5 places; put titanium in; stripped muscle from bone; wired jaw shut"  . TUBAL LIGATION  1979  . VAGINAL HYSTERECTOMY  1985   Social History   Social History Narrative   2 sons--not in area   Lives on property with her mother [name]      Has living will   Would want one of her sons, [name], to be Media planner   Requests DNR   No tube feeds   Immunization History  Administered Date(s) Administered  . Influenza Inj Mdck Quad Pf 04/16/2018  . Influenza, High Dose Seasonal PF 04/09/2019, 02/22/2020  . Influenza-Unspecified 04/09/2019  . Moderna SARS-COVID-2 Vaccination 09/06/2019, 10/05/2019, 03/19/2020  . Pneumococcal Conjugate-13 05/10/2013  . Pneumococcal Polysaccharide-23 09/20/2014  . Tdap 04/30/2011  . Zoster Recombinat (Shingrix) 10/01/2016     Objective: Vital  Signs: There were no vitals taken for this visit.   Physical Exam   Musculoskeletal Exam: ***  CDAI Exam: CDAI Score: -- Patient Global: --; Provider Global: -- Swollen: --; Tender: -- Joint Exam 05/04/2020   No joint exam has been documented for this visit   There is currently no information documented on the homunculus. Go to the Rheumatology activity and complete the homunculus joint  exam.  Investigation: No additional findings.  Imaging: No results found.  Recent Labs: Lab Results  Component Value Date   WBC 6.6 03/21/2020   HGB 15.1 03/21/2020   PLT 205 03/21/2020   NA 132 (L) 03/21/2020   K 4.2 03/21/2020   CL 97 (L) 03/21/2020   CO2 28 03/21/2020   GLUCOSE 103 (H) 03/21/2020   BUN 10 03/21/2020   CREATININE 0.82 03/21/2020   BILITOT 1.3 (H) 03/21/2020   ALKPHOS 98 07/27/2018   AST 24 03/21/2020   ALT 22 03/21/2020   PROT 7.4 03/21/2020   ALBUMIN 4.3 08/03/2018   CALCIUM 10.0 03/21/2020   GFRAA 83 03/21/2020   QFTBGOLDPLUS NEGATIVE 12/23/2019    Speciality Comments: No specialty comments available.  Procedures:  No procedures performed Allergies: Amoxapine and related, Seroquel [quetiapine], and Other   Assessment / Plan:     Visit Diagnoses: No diagnosis found.  Orders: No orders of the defined types were placed in this encounter.  No orders of the defined types were placed in this encounter.   Face-to-face time spent with patient was *** minutes. Greater than 50% of time was spent in counseling and coordination of care.  Follow-Up Instructions: No follow-ups on file.   Ofilia Neas, PA-C  Note - This record has been created using Dragon software.  Chart creation errors have been sought, but may not always  have been located. Such creation errors do not reflect on  the standard of medical care.

## 2020-05-03 DIAGNOSIS — M25522 Pain in left elbow: Secondary | ICD-10-CM | POA: Diagnosis not present

## 2020-05-03 DIAGNOSIS — M25521 Pain in right elbow: Secondary | ICD-10-CM | POA: Diagnosis not present

## 2020-05-04 ENCOUNTER — Ambulatory Visit: Payer: Medicare Other | Admitting: Rheumatology

## 2020-05-04 DIAGNOSIS — Z87891 Personal history of nicotine dependence: Secondary | ICD-10-CM

## 2020-05-04 DIAGNOSIS — Z79899 Other long term (current) drug therapy: Secondary | ICD-10-CM

## 2020-05-04 DIAGNOSIS — Z8709 Personal history of other diseases of the respiratory system: Secondary | ICD-10-CM

## 2020-05-04 DIAGNOSIS — M7022 Olecranon bursitis, left elbow: Secondary | ICD-10-CM

## 2020-05-04 DIAGNOSIS — M19041 Primary osteoarthritis, right hand: Secondary | ICD-10-CM

## 2020-05-04 DIAGNOSIS — M47819 Spondylosis without myelopathy or radiculopathy, site unspecified: Secondary | ICD-10-CM

## 2020-05-04 DIAGNOSIS — M170 Bilateral primary osteoarthritis of knee: Secondary | ICD-10-CM

## 2020-05-04 DIAGNOSIS — F331 Major depressive disorder, recurrent, moderate: Secondary | ICD-10-CM | POA: Diagnosis not present

## 2020-05-04 DIAGNOSIS — H209 Unspecified iridocyclitis: Secondary | ICD-10-CM

## 2020-05-04 DIAGNOSIS — M457 Ankylosing spondylitis of lumbosacral region: Secondary | ICD-10-CM

## 2020-05-04 DIAGNOSIS — Z8679 Personal history of other diseases of the circulatory system: Secondary | ICD-10-CM

## 2020-05-04 DIAGNOSIS — F411 Generalized anxiety disorder: Secondary | ICD-10-CM | POA: Diagnosis not present

## 2020-05-04 DIAGNOSIS — Z8659 Personal history of other mental and behavioral disorders: Secondary | ICD-10-CM

## 2020-05-12 DIAGNOSIS — E113293 Type 2 diabetes mellitus with mild nonproliferative diabetic retinopathy without macular edema, bilateral: Secondary | ICD-10-CM | POA: Diagnosis not present

## 2020-05-12 DIAGNOSIS — H35371 Puckering of macula, right eye: Secondary | ICD-10-CM | POA: Diagnosis not present

## 2020-05-12 DIAGNOSIS — H209 Unspecified iridocyclitis: Secondary | ICD-10-CM | POA: Diagnosis not present

## 2020-05-12 DIAGNOSIS — M450 Ankylosing spondylitis of multiple sites in spine: Secondary | ICD-10-CM | POA: Diagnosis not present

## 2020-05-12 DIAGNOSIS — H35033 Hypertensive retinopathy, bilateral: Secondary | ICD-10-CM | POA: Diagnosis not present

## 2020-05-12 DIAGNOSIS — Z961 Presence of intraocular lens: Secondary | ICD-10-CM | POA: Diagnosis not present

## 2020-05-31 NOTE — Progress Notes (Deleted)
Office Visit Note  Patient: Kirsten Harper             Date of Birth: 06-18-1946           MRN: [id number]             PCP: Venia Carbon, MD Referring: Venia Carbon, MD Visit Date: 06/13/2020 Occupation: _0 @  Subjective:  No chief complaint on file.   History of Present Illness: Kirsten Harper is a 74 y.o. female ***   Activities of Daily Living:  Patient reports morning stiffness for *** {minute/hour:19697}.   Patient {ACTIONS;DENIES/REPORTS:21021675::"Denies"} nocturnal pain.  Difficulty dressing/grooming: {ACTIONS;DENIES/REPORTS:21021675::"Denies"} Difficulty climbing stairs: {ACTIONS;DENIES/REPORTS:21021675::"Denies"} Difficulty getting out of chair: {ACTIONS;DENIES/REPORTS:21021675::"Denies"} Difficulty using hands for taps, buttons, cutlery, and/or writing: {ACTIONS;DENIES/REPORTS:21021675::"Denies"}  No Rheumatology ROS completed.   PMFS History:  Patient Active Problem List   Diagnosis Date Noted  . Colitis 07/28/2018  . Preventative health care 05/04/2018  . Advance directive discussed with patient 05/04/2018  . RLS (restless legs syndrome) 04/25/2017  . Mood disorder ([place]) 04/08/2017  . Sensory loss 04/08/2017  . COPD (chronic obstructive pulmonary disease) ([place]) 04/08/2017  . Ankylosing spondylitis of lumbosacral region ([place]) 09/24/2016  . GAD (generalized anxiety disorder) 08/27/2016  . 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 Spondyloarthropathy HLA-B27 positive 06/06/2016  . Iritis 06/06/2016  . High risk medication use 06/06/2016  . Primary osteoarthritis of both hands 06/06/2016  . Smoker 06/06/2016  . COPD exacerbation ([place]) 04/10/2016  . Anxiety 04/10/2016  . Essential hypertension     Past Medical History:  Diagnosis Date  . Ankylosing spondylitis ([place])   . Anxiety   . Arthritis   . Chronic lower back pain   . CKD (chronic kidney disease), stage II    "related to the Ankylosing spondylitis" (04/10/2016)  .  COPD (chronic obstructive pulmonary disease) ([place])   . Eye disease   . Hypertension   . PONV (postoperative nausea and vomiting)    "didn't bother me the last time I had it in ~ 2015"    Family History  Problem Relation Age of Onset  . Hypertension Mother   . Heart disease Sister   . Diabetes Sister   . Pulmonary fibrosis Brother   . Idiopathic pulmonary fibrosis Brother   . Alzheimer's disease Brother   . Dementia Brother    Past Surgical History:  Procedure Laterality Date  . BROW LIFT AND BLEPHAROPLASTY Bilateral ~ 2015  . CATARACT EXTRACTION W/ INTRAOCULAR LENS  IMPLANT, BILATERAL Bilateral 2000s  . CYST REMOVAL TRUNK  12/2018  . MANDIBLE SURGERY  ~ 2000   "jaw had moved to left; broke my jaw in 5 places; put titanium in; stripped muscle from bone; wired jaw shut"  . TUBAL LIGATION  1979  . VAGINAL HYSTERECTOMY  1985   Social History   Social History Narrative   2 sons--not in area   Lives on property with her mother [name]      Has living will   Would want one of her sons, [name], to be Media planner   Requests DNR   No tube feeds   Immunization History  Administered Date(s) Administered  . Influenza Inj Mdck Quad Pf 04/16/2018  . Influenza, High Dose Seasonal PF 04/09/2019, 02/22/2020  . Influenza-Unspecified 04/09/2019  . Moderna SARS-COVID-2 Vaccination 09/06/2019, 10/05/2019, 03/19/2020  . Pneumococcal Conjugate-13 05/10/2013  . Pneumococcal Polysaccharide-23 09/20/2014  . Tdap 04/30/2011  . Zoster Recombinat (Shingrix) 10/01/2016     Objective: Vital  Signs: There were no vitals taken for this visit.   Physical Exam   Musculoskeletal Exam: ***  CDAI Exam: CDAI Score: -- Patient Global: --; Provider Global: -- Swollen: --; Tender: -- Joint Exam 06/13/2020   No joint exam has been documented for this visit   There is currently no information documented on the homunculus. Go to the Rheumatology activity and complete the homunculus joint  exam.  Investigation: No additional findings.  Imaging: No results found.  Recent Labs: Lab Results  Component Value Date   WBC 6.6 03/21/2020   HGB 15.1 03/21/2020   PLT 205 03/21/2020   NA 132 (L) 03/21/2020   K 4.2 03/21/2020   CL 97 (L) 03/21/2020   CO2 28 03/21/2020   GLUCOSE 103 (H) 03/21/2020   BUN 10 03/21/2020   CREATININE 0.82 03/21/2020   BILITOT 1.3 (H) 03/21/2020   ALKPHOS 98 07/27/2018   AST 24 03/21/2020   ALT 22 03/21/2020   PROT 7.4 03/21/2020   ALBUMIN 4.3 08/03/2018   CALCIUM 10.0 03/21/2020   GFRAA 83 03/21/2020   QFTBGOLDPLUS NEGATIVE 12/23/2019    Speciality Comments: No specialty comments available.  Procedures:  No procedures performed Allergies: Amoxapine and related, Seroquel [quetiapine], and Other   Assessment / Plan:     Visit Diagnoses: No diagnosis found.  Orders: No orders of the defined types were placed in this encounter.  No orders of the defined types were placed in this encounter.   Face-to-face time spent with patient was *** minutes. Greater than 50% of time was spent in counseling and coordination of care.  Follow-Up Instructions: No follow-ups on file.   Earnestine Mealing, CMA  Note - This record has been created using Editor, commissioning.  Chart creation errors have been sought, but may not always  have been located. Such creation errors do not reflect on  the standard of medical care.

## 2020-06-11 ENCOUNTER — Other Ambulatory Visit: Payer: Self-pay | Admitting: Internal Medicine

## 2020-06-13 ENCOUNTER — Ambulatory Visit: Payer: Medicare Other | Admitting: Rheumatology

## 2020-06-13 DIAGNOSIS — H209 Unspecified iridocyclitis: Secondary | ICD-10-CM

## 2020-06-13 DIAGNOSIS — M457 Ankylosing spondylitis of lumbosacral region: Secondary | ICD-10-CM

## 2020-06-13 DIAGNOSIS — M7022 Olecranon bursitis, left elbow: Secondary | ICD-10-CM

## 2020-06-13 DIAGNOSIS — Z79899 Other long term (current) drug therapy: Secondary | ICD-10-CM

## 2020-06-13 DIAGNOSIS — Z8709 Personal history of other diseases of the respiratory system: Secondary | ICD-10-CM

## 2020-06-13 DIAGNOSIS — Z8679 Personal history of other diseases of the circulatory system: Secondary | ICD-10-CM

## 2020-06-13 DIAGNOSIS — M19041 Primary osteoarthritis, right hand: Secondary | ICD-10-CM

## 2020-06-13 DIAGNOSIS — Z87891 Personal history of nicotine dependence: Secondary | ICD-10-CM

## 2020-06-13 DIAGNOSIS — M47819 Spondylosis without myelopathy or radiculopathy, site unspecified: Secondary | ICD-10-CM

## 2020-06-13 DIAGNOSIS — M170 Bilateral primary osteoarthritis of knee: Secondary | ICD-10-CM

## 2020-06-13 DIAGNOSIS — Z8659 Personal history of other mental and behavioral disorders: Secondary | ICD-10-CM

## 2020-06-17 NOTE — Progress Notes (Signed)
Office Visit Note  Patient: Kirsten Harper             Date of Birth: 20-Jul-1945           MRN: [id number]             PCP: Venia Carbon, MD Referring: Venia Carbon, MD Visit Date: 06/22/2020 Occupation: _0 @  Subjective:  Pain and swelling in multiple joints.   History of Present Illness: Kirsten Harper is a 74 y.o. female with history of ankylosing spondylitis, osteoarthritis.  She states she has been under a lot of stress as her several family members were sick and she has been the caregiver.  She has been having pain and swelling in her bilateral hands and her wrist joints.  She also has difficulty walking due to discomfort in her lower back.  She has discomfort in her knee joints and her feet.  She states she decided not to start on leflunomide as she did not want any medications with more side effects.  She is under a lot of stress and not wants to take medications to relieve anxiety.  Activities of Daily Living:  Patient reports morning stiffness for a few  minutes.   Patient Denies nocturnal pain.  Difficulty dressing/grooming: Denies Difficulty climbing stairs: Denies Difficulty getting out of chair: Denies Difficulty using hands for taps, buttons, cutlery, and/or writing: Reports  Review of Systems  Constitutional: Positive for fatigue.  HENT: Positive for nose dryness. Negative for mouth sores and mouth dryness.   Eyes: Positive for pain, itching and dryness. Negative for redness.  Respiratory: Negative for shortness of breath and difficulty breathing.   Cardiovascular: Negative for chest pain and palpitations.  Gastrointestinal: Negative for blood in stool, constipation and diarrhea.  Endocrine: Negative for increased urination.  Genitourinary: Negative for difficulty urinating.  Musculoskeletal: Positive for arthralgias, joint pain, joint swelling and morning stiffness. Negative for myalgias, muscle tenderness and myalgias.  Skin: Negative for color  change, rash and redness.  Allergic/Immunologic: Negative for susceptible to infections.  Neurological: Positive for weakness. Negative for dizziness, numbness, headaches and memory loss.  Hematological: Negative for bruising/bleeding tendency.  Psychiatric/Behavioral: Positive for sleep disturbance. Negative for confusion. The patient is nervous/anxious.     PMFS History:  Patient Active Problem List   Diagnosis Date Noted  . Colitis 07/28/2018  . Preventative health care 05/04/2018  . Advance directive discussed with patient 05/04/2018  . RLS (restless legs syndrome) 04/25/2017  . Mood disorder ([place]) 04/08/2017  . Sensory loss 04/08/2017  . COPD (chronic obstructive pulmonary disease) ([place]) 04/08/2017  . Ankylosing spondylitis of lumbosacral region ([place]) 09/24/2016  . GAD (generalized anxiety disorder) 08/27/2016  . 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 Spondyloarthropathy HLA-B27 positive 06/06/2016  . Iritis 06/06/2016  . High risk medication use 06/06/2016  . Primary osteoarthritis of both hands 06/06/2016  . Smoker 06/06/2016  . COPD exacerbation ([place]) 04/10/2016  . Anxiety 04/10/2016  . Essential hypertension     Past Medical History:  Diagnosis Date  . Ankylosing spondylitis ([place])   . Anxiety   . Arthritis   . Chronic lower back pain   . CKD (chronic kidney disease), stage II    "related to the Ankylosing spondylitis" (04/10/2016)  . COPD (chronic obstructive pulmonary disease) ([place])   . Eye disease   . Hypertension   . PONV (postoperative nausea and vomiting)    "didn't bother me the last time I had it in ~ 2015"  Family History  Problem Relation Age of Onset  . Hypertension Mother   . Heart disease Sister   . Diabetes Sister   . Pulmonary fibrosis Brother   . Idiopathic pulmonary fibrosis Brother   . Alzheimer's disease Brother   . Dementia Brother    Past Surgical History:  Procedure Laterality Date  . BROW LIFT AND  BLEPHAROPLASTY Bilateral ~ 2015  . CATARACT EXTRACTION W/ INTRAOCULAR LENS  IMPLANT, BILATERAL Bilateral 2000s  . CYST REMOVAL TRUNK  12/2018  . MANDIBLE SURGERY  ~ 2000   "jaw had moved to left; broke my jaw in 5 places; put titanium in; stripped muscle from bone; wired jaw shut"  . TUBAL LIGATION  1979  . VAGINAL HYSTERECTOMY  1985   Social History   Social History Narrative   2 sons--not in area   Lives on property with her mother [name]      Has living will   Would want one of her sons, [name], to be Media planner   Requests DNR   No tube feeds   Immunization History  Administered Date(s) Administered  . Influenza Inj Mdck Quad Pf 04/16/2018  . Influenza, High Dose Seasonal PF 04/09/2019, 02/22/2020  . Influenza-Unspecified 04/09/2019  . Moderna SARS-COVID-2 Vaccination 09/06/2019, 10/05/2019, 03/19/2020  . Pneumococcal Conjugate-13 05/10/2013  . Pneumococcal Polysaccharide-23 09/20/2014  . Tdap 04/30/2011  . Zoster Recombinat (Shingrix) 10/01/2016     Objective: Vital Signs: BP (!) 151/77 (BP Location: Left Arm, Patient Position: Sitting, Cuff Size: Normal)   Pulse 74   Resp 16   Ht _0  (1.676 m)   Wt 184 lb (83.5 kg)   BMI 29.70 kg/m    Physical Exam Vitals and nursing note reviewed.  Constitutional:      Appearance: She is well-developed and well-nourished.  HENT:     Head: Normocephalic and atraumatic.  Eyes:     Extraocular Movements: EOM normal.     Conjunctiva/sclera: Conjunctivae normal.  Cardiovascular:     Rate and Rhythm: Normal rate and regular rhythm.     Pulses: Intact distal pulses.     Heart sounds: Normal heart sounds.  Pulmonary:     Effort: Pulmonary effort is normal.     Breath sounds: Normal breath sounds.  Abdominal:     General: Bowel sounds are normal.     Palpations: Abdomen is soft.  Musculoskeletal:     Cervical back: Normal range of motion.  Lymphadenopathy:     Cervical: No cervical adenopathy.  Skin:    General:  Skin is warm and dry.     Capillary Refill: Capillary refill takes less than 2 seconds.     Comments: Subcutaneous nodules were noted over bilateral elbows.  Neurological:     Mental Status: She is alert and oriented to person, place, and time.  Psychiatric:        Mood and Affect: Mood and affect normal.        Behavior: Behavior normal.      Musculoskeletal Exam: She has limited range of motion of cervical and lumbar spine with discomfort.  Shoulder joints, elbow joints, wrist joints with good range of motion.  She has synovitis of the bilateral wrist joints.  She has synovitis over bilateral MCPs and PIPs.  She had discomfort range of motion of her hip joints and knee joints.  Ankles and MTPs had no tenderness.  CDAI Exam: CDAI Score: -- Patient Global: --; Provider Global: -- Swollen: 0 ; Tender: 0  Joint  Exam 06/22/2020   No joint exam has been documented for this visit   There is currently no information documented on the homunculus. Go to the Rheumatology activity and complete the homunculus joint exam.  Investigation: No additional findings.  Imaging: No results found.  Recent Labs: Lab Results  Component Value Date   WBC 6.6 03/21/2020   HGB 15.1 03/21/2020   PLT 205 03/21/2020   NA 132 (L) 03/21/2020   K 4.2 03/21/2020   CL 97 (L) 03/21/2020   CO2 28 03/21/2020   GLUCOSE 103 (H) 03/21/2020   BUN 10 03/21/2020   CREATININE 0.82 03/21/2020   BILITOT 1.3 (H) 03/21/2020   ALKPHOS 98 07/27/2018   AST 24 03/21/2020   ALT 22 03/21/2020   PROT 7.4 03/21/2020   ALBUMIN 4.3 08/03/2018   CALCIUM 10.0 03/21/2020   GFRAA 83 03/21/2020   QFTBGOLDPLUS NEGATIVE 12/23/2019   March 16, 2020 synovial fluid from the olecranon bursa showed WBC count 1220, crystals negative, culture negative  Speciality Comments: Methotrexate-nausea, Areva started March 21, 2020  Procedures:  No procedures performed Allergies: Amoxapine and related, Seroquel [quetiapine], and Other    Assessment / Plan:     Visit Diagnoses: Ankylosing spondylitis of lumbosacral region (HCC)-she continues to have some lower back pain.  She also has subcutaneous nodules.  I believe she may have a component of rheumatoid arthritis as well.  She had synovitis over MCPs and PIPs.  She was placed on leflunomide at the last visit but she decided not to take the medication as she does not want any more medications with side effects.  She had taken methotrexate in the past and discontinued due to nausea.  HLA B 27 Positive   High risk medication use - Humira 40 mg subcu every 14 days.  Arava added March 21, 2020- pt. did not start.  Methotrexate caused nausea in the past. - Plan: CBC with Differential/Platelet, COMPLETE METABOLIC PANEL WITH GFR today and then every 3 months to monitor for drug toxicity.  She is fully at vaccinated against COVID-19 and also had a booster.  Olecranon bursitis of left elbow - Aspirated and injected on March 21, 2020.  Synovial fluid negative for culture.  She had no recurrence of the bursitis.  Subcutaneous nodules-she has bilateral elbow areas subcutaneous nodules.  She has been seen by orthopedic surgeon per patient.  Uveitis - No recent episodes.  Patient gives history of dry eyes.  Patient states she was seen by ophthalmologist recently.  I am uncertain if she was seen for dry eyes or uveitis.  Primary osteoarthritis of both hands-she has discomfort in her hands.  Joint protection was discussed.  Primary osteoarthritis of both knees-chronic pain.  Chronic midline low back pain without sciatica-she has chronic pain and discomfort.  Other medical problems are listed as follows:  History of COPD  Former smoker  History of hypertension  History of anxiety  Orders: Orders Placed This Encounter  Procedures  . CBC with Differential/Platelet  . COMPLETE METABOLIC PANEL WITH GFR   No orders of the defined types were placed in this  encounter.    Follow-Up Instructions: Return in about 3 months (around 09/20/2020) for Rheumatoid arthritis, Osteoarthritis.   Bo Merino, MD  Note - This record has been created using Editor, commissioning.  Chart creation errors have been sought, but may not always  have been located. Such creation errors do not reflect on  the standard of medical care.

## 2020-06-20 DIAGNOSIS — H209 Unspecified iridocyclitis: Secondary | ICD-10-CM | POA: Diagnosis not present

## 2020-06-20 DIAGNOSIS — Z961 Presence of intraocular lens: Secondary | ICD-10-CM | POA: Diagnosis not present

## 2020-06-20 DIAGNOSIS — M450 Ankylosing spondylitis of multiple sites in spine: Secondary | ICD-10-CM | POA: Diagnosis not present

## 2020-06-20 DIAGNOSIS — H35371 Puckering of macula, right eye: Secondary | ICD-10-CM | POA: Diagnosis not present

## 2020-06-20 DIAGNOSIS — E113293 Type 2 diabetes mellitus with mild nonproliferative diabetic retinopathy without macular edema, bilateral: Secondary | ICD-10-CM | POA: Diagnosis not present

## 2020-06-20 DIAGNOSIS — H35033 Hypertensive retinopathy, bilateral: Secondary | ICD-10-CM | POA: Diagnosis not present

## 2020-06-22 ENCOUNTER — Ambulatory Visit (INDEPENDENT_AMBULATORY_CARE_PROVIDER_SITE_OTHER): Payer: Medicare Other | Admitting: Rheumatology

## 2020-06-22 ENCOUNTER — Encounter: Payer: Self-pay | Admitting: Rheumatology

## 2020-06-22 ENCOUNTER — Other Ambulatory Visit: Payer: Self-pay

## 2020-06-22 ENCOUNTER — Telehealth: Payer: Self-pay | Admitting: Pharmacist

## 2020-06-22 VITALS — BP 151/77 | HR 74 | Resp 16 | Ht 66.0 in | Wt 184.0 lb

## 2020-06-22 DIAGNOSIS — Z8709 Personal history of other diseases of the respiratory system: Secondary | ICD-10-CM | POA: Diagnosis not present

## 2020-06-22 DIAGNOSIS — Z8679 Personal history of other diseases of the circulatory system: Secondary | ICD-10-CM | POA: Diagnosis not present

## 2020-06-22 DIAGNOSIS — M47819 Spondylosis without myelopathy or radiculopathy, site unspecified: Secondary | ICD-10-CM

## 2020-06-22 DIAGNOSIS — M457 Ankylosing spondylitis of lumbosacral region: Secondary | ICD-10-CM | POA: Diagnosis not present

## 2020-06-22 DIAGNOSIS — Z8659 Personal history of other mental and behavioral disorders: Secondary | ICD-10-CM | POA: Diagnosis not present

## 2020-06-22 DIAGNOSIS — Z79899 Other long term (current) drug therapy: Secondary | ICD-10-CM | POA: Diagnosis not present

## 2020-06-22 DIAGNOSIS — M19042 Primary osteoarthritis, left hand: Secondary | ICD-10-CM

## 2020-06-22 DIAGNOSIS — M7022 Olecranon bursitis, left elbow: Secondary | ICD-10-CM | POA: Diagnosis not present

## 2020-06-22 DIAGNOSIS — M545 Low back pain, unspecified: Secondary | ICD-10-CM

## 2020-06-22 DIAGNOSIS — H209 Unspecified iridocyclitis: Secondary | ICD-10-CM | POA: Diagnosis not present

## 2020-06-22 DIAGNOSIS — M19041 Primary osteoarthritis, right hand: Secondary | ICD-10-CM

## 2020-06-22 DIAGNOSIS — M170 Bilateral primary osteoarthritis of knee: Secondary | ICD-10-CM | POA: Diagnosis not present

## 2020-06-22 DIAGNOSIS — G8929 Other chronic pain: Secondary | ICD-10-CM

## 2020-06-22 DIAGNOSIS — Z87891 Personal history of nicotine dependence: Secondary | ICD-10-CM | POA: Diagnosis not present

## 2020-06-22 MED ORDER — HUMIRA (2 PEN) 40 MG/0.4ML ~~LOC~~ AJKT: 40.0000 mg | AUTO-INJECTOR | SUBCUTANEOUS | 0 refills | Status: DC

## 2020-06-22 NOTE — Progress Notes (Signed)
Humira refilled, verbal order per Dr. Estanislado Pandy.

## 2020-06-22 NOTE — Patient Instructions (Signed)
Standing Labs We placed an order today for your standing lab work.   Please have your standing labs drawn in March and every 3 months    If possible, please have your labs drawn 2 weeks prior to your appointment so that the provider can discuss your results at your appointment.  We have open lab daily Monday through Thursday from 8:30-12:30 PM and 1:30-4:30 PM and Friday from 8:30-12:30 PM and 1:30-4:00 PM at the office of Dr. Zahniya Zellars, [facility].   Please be advised, patients with office appointments requiring lab work will take precedents over walk-in lab work.  If possible, please come for your lab work on Monday and Friday afternoons, as you may experience shorter wait times. The office is located at [address], [address], [place], [place] [address] No appointment is necessary.   Labs are drawn by [facility]. Please bring your co-pay at the time of your lab draw.  You may receive a bill from [facility] for your lab work.  If you wish to have your labs drawn at another location, please call the office 24 hours in advance to send orders.  If you have any questions regarding directions or hours of operation,  please call [phone].   As a reminder, please drink plenty of water prior to coming for your lab work. Thanks!   

## 2020-06-22 NOTE — Telephone Encounter (Signed)
Received renewal application from [employer] for [facility] patient assistance. Faxed completed provider and patient portion on 06/14/20.  Plans to drop off or mail social security form to clinic to re-submit with application after discussion at clinic visit with Dr. Estanislado Pandy on 06/22/20.  Knox Saliva, PharmD, MPH Clinical Pharmacist (Rheumatology and Pulmonology)

## 2020-06-22 NOTE — Addendum Note (Signed)
Addended by: Francis Gaines C on: 06/22/2020 11:53 AM   Modules accepted: Orders

## 2020-06-23 ENCOUNTER — Telehealth: Payer: Self-pay

## 2020-06-23 LAB — COMPLETE METABOLIC PANEL WITH GFR
AG Ratio: 1.4 (calc) (ref 1.0–2.5)
ALT: 18 U/L (ref 6–29)
AST: 16 U/L (ref 10–35)
Albumin: 4.3 g/dL (ref 3.6–5.1)
Alkaline phosphatase (APISO): 90 U/L (ref 37–153)
BUN: 13 mg/dL (ref 7–25)
CO2: 28 mmol/L (ref 20–32)
Calcium: 10 mg/dL (ref 8.6–10.4)
Chloride: 99 mmol/L (ref 98–110)
Creat: 0.68 mg/dL (ref 0.60–0.93)
GFR, Est African American: 101 mL/min/{1.73_m2} (ref >=60)
GFR, Est Non African American: 87 mL/min/{1.73_m2} (ref >=60)
Globulin: 3 g/dL (calc) (ref 1.9–3.7)
Glucose, Bld: 109 mg/dL — ABNORMAL HIGH (ref 65–99)
Potassium: 4.4 mmol/L (ref 3.5–5.3)
Sodium: 133 mmol/L — ABNORMAL LOW (ref 135–146)
Total Bilirubin: 1 mg/dL (ref 0.2–1.2)
Total Protein: 7.3 g/dL (ref 6.1–8.1)

## 2020-06-23 LAB — CBC WITH DIFFERENTIAL/PLATELET
Absolute Monocytes: 732 cells/uL (ref 200–950)
Basophils Absolute: 30 cells/uL (ref 0–200)
Basophils Relative: 0.5 %
Eosinophils Absolute: 192 cells/uL (ref 15–500)
Eosinophils Relative: 3.2 %
HCT: 42.9 % (ref 35.0–45.0)
Hemoglobin: 15.2 g/dL (ref 11.7–15.5)
Lymphs Abs: 1632 cells/uL (ref 850–3900)
MCH: 33.4 pg — ABNORMAL HIGH (ref 27.0–33.0)
MCHC: 35.4 g/dL (ref 32.0–36.0)
MCV: 94.3 fL (ref 80.0–100.0)
MPV: 10.6 fL (ref 7.5–12.5)
Monocytes Relative: 12.2 %
Neutro Abs: 3414 cells/uL (ref 1500–7800)
Neutrophils Relative %: 56.9 %
Platelets: 225 10*3/uL (ref 140–400)
RBC: 4.55 10*6/uL (ref 3.80–5.10)
RDW: 12.2 % (ref 11.0–15.0)
Total Lymphocyte: 27.2 %
WBC: 6 10*3/uL (ref 3.8–10.8)

## 2020-06-23 NOTE — Telephone Encounter (Signed)
Attempted to contact the patient and left message to advise patient labs are stable.

## 2020-06-23 NOTE — Telephone Encounter (Signed)
Patient left a voicemail stating she was returning a call to the office.   °

## 2020-06-23 NOTE — Progress Notes (Signed)
CBC and CMP are stable.

## 2020-06-26 NOTE — Telephone Encounter (Signed)
Submitted Patient Assistance Application renewal to [place] for [employer] along with provider portion, signed patient portion, insurance information, med list, and income documents (Social Security documents). Will update patient when we receive a response.  Fax# [phone] Phone# [phone]  Knox Saliva, PharmD, MPH Clinical Pharmacist (Rheumatology and Pulmonology)

## 2020-07-08 ENCOUNTER — Other Ambulatory Visit: Payer: Self-pay | Admitting: Internal Medicine

## 2020-07-13 ENCOUNTER — Telehealth: Payer: Self-pay | Admitting: *Deleted

## 2020-07-13 NOTE — Telephone Encounter (Signed)
Patient contacted the office stating she is in need of an appointment to have elbow drained. Patient advised Dr. Corliss Skains is not in the office today and will return to the office next week. Patient offered an appointment for next week. Patient declined and states she was trying to get an appointment prior to the new year.

## 2020-07-17 NOTE — Telephone Encounter (Signed)
Received a fax from [employer] regarding an approval for [employer] patient assistance from 07/15/20 to 07/14/21.  Approval letter sent to scan center.  Phone number: [phone]

## 2020-07-23 ENCOUNTER — Other Ambulatory Visit: Payer: Self-pay | Admitting: Rheumatology

## 2020-07-24 NOTE — Telephone Encounter (Signed)
Last Visit: 06/22/2020 Next Visit: 11/21/2020 Labs: 06/22/2020, CBC and CMP are stable  Current Dose per office note 06/22/2020, medication not discussed at visit DX: Ankylosing spondylitis of lumbosacral region   Okay to refill sulindac?

## 2020-08-03 ENCOUNTER — Other Ambulatory Visit: Payer: Self-pay | Admitting: Internal Medicine

## 2020-08-08 ENCOUNTER — Other Ambulatory Visit: Payer: Self-pay | Admitting: Internal Medicine

## 2020-08-21 ENCOUNTER — Other Ambulatory Visit: Payer: Self-pay

## 2020-08-21 MED ORDER — PREDNISONE 5 MG PO TABS: ORAL_TABLET | ORAL | 0 refills | Status: DC

## 2020-08-21 NOTE — Telephone Encounter (Signed)
Swelling in joints? elbow, wrist, hand Any pain? shoulder, wrist, hand, neck. Patient states the hands are the worst.  When did it start? 2 weeks ago Does anything relieve the discomfort? Sulindac helps.  Does anything make it worse? no Have you had a recent injury? No Are you taking your medication as prescribed? Patient is on Humira every 14 days as prescribed.   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 Patient is having increased stress. Patient is requesting Prednisone. Please advise.

## 2020-08-21 NOTE — Telephone Encounter (Signed)
Patient called stating her hands are swollen and requesting a prescription of Prednisone to be sent to [facility] on [address].  Patient states she is taking care of her mother with dementia and needs help with the pain and swelling.

## 2020-08-21 NOTE — Telephone Encounter (Signed)
Reviewed prednisone taper starting at 20 mg and taper by 5 mg every 4 days.  Please advise to avoid food rich in carbs.  The side effects of prednisone include weight gain, increase in blood sugar, and increase in blood pressure.

## 2020-08-21 NOTE — Telephone Encounter (Signed)
Left message to advise patient prednisone prescription has been sent to the pharmacy. Advised patient to avoid food rich in carbs.  The side effects of prednisone include weight gain, increase in blood sugar, and increase in blood pressure.

## 2020-08-22 ENCOUNTER — Other Ambulatory Visit: Payer: Self-pay | Admitting: Internal Medicine

## 2020-09-02 ENCOUNTER — Other Ambulatory Visit: Payer: Self-pay | Admitting: Internal Medicine

## 2020-09-13 ENCOUNTER — Other Ambulatory Visit: Payer: Self-pay

## 2020-09-13 ENCOUNTER — Ambulatory Visit (INDEPENDENT_AMBULATORY_CARE_PROVIDER_SITE_OTHER): Payer: Medicare Other | Admitting: Internal Medicine

## 2020-09-13 ENCOUNTER — Encounter: Payer: Self-pay | Admitting: Internal Medicine

## 2020-09-13 VITALS — BP 112/60 | HR 76 | Temp 97.3°F | Ht 66.0 in | Wt 184.0 lb

## 2020-09-13 DIAGNOSIS — Z7189 Other specified counseling: Secondary | ICD-10-CM

## 2020-09-13 DIAGNOSIS — F39 Unspecified mood [affective] disorder: Secondary | ICD-10-CM | POA: Diagnosis not present

## 2020-09-13 DIAGNOSIS — J449 Chronic obstructive pulmonary disease, unspecified: Secondary | ICD-10-CM

## 2020-09-13 DIAGNOSIS — I10 Essential (primary) hypertension: Secondary | ICD-10-CM

## 2020-09-13 DIAGNOSIS — Z0000 Encounter for general adult medical examination without abnormal findings: Secondary | ICD-10-CM

## 2020-09-13 DIAGNOSIS — M457 Ankylosing spondylitis of lumbosacral region: Secondary | ICD-10-CM | POA: Diagnosis not present

## 2020-09-13 NOTE — Progress Notes (Signed)
Hearing Screening   Method: Audiometry   125Hz  250Hz  500Hz  1000Hz  2000Hz  3000Hz  4000Hz  6000Hz  8000Hz   Right ear:   20 20 20  25     Left ear:   20 20 20  20     Vision Screening Comments: Sees eye doctor regularly.

## 2020-09-13 NOTE — Assessment & Plan Note (Signed)
Pain is flaring despite the humira Urged her to review this with Dr Patrecia Pour

## 2020-09-13 NOTE — Progress Notes (Signed)
Subjective:    Patient ID: Kirsten Harper, female    DOB: 1946-03-13, 75 y.o.   MRN: [id number]  HPI Here for Medicare wellness visit and follow up of chronic health conditions This visit occurred during the SARS-CoV-2 public health emergency.  Safety protocols were in place, including screening questions prior to the visit, additional usage of staff PPE, and extensive cleaning of exam room while observing appropriate contact time as indicated for disinfecting solutions.   Reviewed advanced directives Reviewed other doctors----Dr Devashwar--rheumatology, Dr Roxanne Gates, new psychiatrist (didn't like [facility])--Dr Sanjous?, Dr Williams Che Sadie Haber) Vision is fair Hearing is okay Back up to 1PPD cigarettes---just won't quit Rare alcohol No falls this year Some depression--mostly reactive. Not anhedonic. Some panic spells Independent with instrumental ADLs Having memory problems  Uveitis and ankylosing spondylitis are worse Still on humira Eyes water a lot from blocked tear ducts Care with specialists still  Stress with care for her mother Brother has frontotemporal dementia  Fingers feel numb at times Has appt with [facility] neuro about this Will review her memory concerns as well  Uses the ropinirole for RLS--but still doesn't sleep well  Has swollen bursa on left elbow now Not inflamed  No chest pain or SOB No dizziness or syncope Some foot swelling No palpitations  Breathing okay Some chronic cough--no worse Does have sputum  Current Outpatient Medications on File Prior to Visit  Medication Sig Dispense Refill  . Adalimumab (HUMIRA PEN) 40 MG/0.4ML PNKT Inject 40 mg into the skin every 14 (fourteen) days. 3 each 0  . albuterol (VENTOLIN HFA) 108 (90 Base) MCG/ACT inhaler INHALE 2 PUFFS INTO THE LUNGS EVERY 4 HOURS AS NEEDED FOR WHEEZE OR FOR SHORTNESS OF BREATH 18 g 0  . amLODipine (NORVASC) 5 MG tablet TAKE 1 TABLET (5 MG TOTAL) BY MOUTH DAILY. NEEDS OFFICE  VISIT 30 tablet 0  . dorzolamide-timolol (COSOPT) 22.3-6.8 MG/ML ophthalmic solution Place 1 drop into both eyes 2 (two) times daily.     Marland Kitchen escitalopram (LEXAPRO) 10 MG tablet Take 10 mg by mouth daily.    Marland Kitchen losartan (COZAAR) 100 MG tablet Take 1 tablet (100 mg total) by mouth daily. 30 tablet 0  . LOTEMAX 0.5 % ophthalmic suspension Place 1 drop into both eyes 2 (two) times daily.     Marland Kitchen rOPINIRole (REQUIP) 1 MG tablet TAKE 3 TABLETS (3 MG TOTAL) BY MOUTH AT BEDTIME. 270 tablet 3  . sulindac (CLINORIL) 150 MG tablet TAKE 1 TABLET BY MOUTH TWICE A DAY AS NEEDED 60 tablet 2   No current facility-administered medications on file prior to visit.    Allergies  Allergen Reactions  . Amoxapine And Related Other (See Comments)    (Asendin- "Tricyclic antidepressant") Patient reports it did not work for her  . Seroquel [Quetiapine] Other (See Comments)    Worsened restless legs  . Other Palpitations and Other (See Comments)    Psych med (name not recalled by the patient, but it "starts with a Z")- Worsened restless legs, also (NOT Zyprexa; I asked)    Past Medical History:  Diagnosis Date  . Ankylosing spondylitis ([place])   . Anxiety   . Arthritis   . Chronic lower back pain   . CKD (chronic kidney disease), stage II    "related to the Ankylosing spondylitis" (04/10/2016)  . COPD (chronic obstructive pulmonary disease) ([place])   . Eye disease   . Hypertension   . PONV (postoperative nausea and vomiting)    "didn't bother me the  last time I had it in ~ 2015"    Past Surgical History:  Procedure Laterality Date  . BROW LIFT AND BLEPHAROPLASTY Bilateral ~ 2015  . CATARACT EXTRACTION W/ INTRAOCULAR LENS  IMPLANT, BILATERAL Bilateral 2000s  . CYST REMOVAL TRUNK  12/2018  . MANDIBLE SURGERY  ~ 2000   "jaw had moved to left; broke my jaw in 5 places; put titanium in; stripped muscle from bone; wired jaw shut"  . TUBAL LIGATION  1979  . VAGINAL HYSTERECTOMY  1985    Family History  Problem  Relation Age of Onset  . Hypertension Mother   . Heart disease Sister   . Diabetes Sister   . Pulmonary fibrosis Brother   . Idiopathic pulmonary fibrosis Brother   . Alzheimer's disease Brother   . Dementia Brother   . Frontotemporal dementia Brother     Social History   Socioeconomic History  . Marital status: Divorced    Spouse name: Not on file  . Number of children: 2  . Years of education: Not on file  . Highest education level: Not on file  Occupational History  . Occupation: Sales promotion account executive for [employer]    Comment: Retired  Tobacco Use  . Smoking status: Light Tobacco Smoker    Years: 51.00    Types: Cigarettes  . Smokeless tobacco: Never Used  Vaping Use  . Vaping Use: Never used  Substance and Sexual Activity  . Alcohol use: Yes    Alcohol/week: 0.0 standard drinks    Comment: occ  . Drug use: No  . Sexual activity: Never  Other Topics Concern  . Not on file  Social History Narrative   2 sons--not in area   Lives on property with her mother [name]      Has living will   Would want one of her sons, [name], to be Media planner   Requests DNR   No tube feeds   Social Determinants of Health   Financial Resource Strain: Not on file  Food Insecurity: Not on file  Transportation Needs: Not on file  Physical Activity: Not on file  Stress: Not on file  Social Connections: Not on file  Intimate Partner Violence: Not on file   Review of Systems Teeth okay--hasn't been seeing dentist No skin lesions Appetite is good Weight is up a little Wears seat belt Still doesn't sleep well---has to sleep on sofa due to her back No heartburn or dysphagia Bowels move well--no blood     Objective:   Physical Exam Constitutional:      Appearance: Normal appearance.  HENT:     Mouth/Throat:     Comments: No lesions Eyes:     Conjunctiva/sclera: Conjunctivae normal.     Pupils: Pupils are equal, round, and reactive to light.  Cardiovascular:      Rate and Rhythm: Normal rate and regular rhythm.     Pulses: Normal pulses.     Heart sounds: No murmur heard. No gallop.   Pulmonary:     Effort: Pulmonary effort is normal.     Breath sounds: Normal breath sounds. No wheezing or rales.  Abdominal:     Palpations: Abdomen is soft.     Tenderness: There is no abdominal tenderness.  Musculoskeletal:     Cervical back: Neck supple.     Right lower leg: No edema.     Left lower leg: No edema.     Comments: Swelling in multiple finger joints but no marked inflammation or  tenderness Non inflamed olecranon swelling on left  Lymphadenopathy:     Cervical: No cervical adenopathy.  Skin:    General: Skin is warm.     Findings: No rash.  Neurological:     Mental Status: She is alert.     Comments: Going to neurologist for cognitive evaluation  Psychiatric:        Mood and Affect: Mood normal.     Comments: Mild anxiety            Assessment & Plan:

## 2020-09-13 NOTE — Assessment & Plan Note (Signed)
Symptoms are mild Just not willing to stop smoking

## 2020-09-13 NOTE — Assessment & Plan Note (Signed)
I have personally reviewed the Medicare Annual Wellness questionnaire and have noted 1. The patient's medical and social history 2. Their use of alcohol, tobacco or illicit drugs 3. Their current medications and supplements 4. The patient's functional ability including ADL's, fall risks, home safety risks and hearing or visual             impairment. 5. Diet and physical activities 6. Evidence for depression or mood disorders  The patients weight, height, BMI and visual acuity have been recorded in the chart I have made referrals, counseling and provided education to the patient based review of the above and I have provided the pt with a written personalized care plan for preventive services.  I have provided you with a copy of your personalized plan for preventive services. Please take the time to review along with your updated medication list.  Had COVID booster Yearly flu vaccine Recent colon --has 2 year recall for polyps Still prefers no mammogram Discussed trying some brief exercise

## 2020-09-13 NOTE — Assessment & Plan Note (Signed)
BP Readings from Last 3 Encounters:  09/13/20 112/60  06/22/20 (!) 151/77  03/21/20 (!) 152/88   Control okay on losartan and amlodipine Okay labs recently

## 2020-09-13 NOTE — Assessment & Plan Note (Signed)
Chronic anxiety Some situational depression Seeing psychiatrist

## 2020-09-13 NOTE — Assessment & Plan Note (Signed)
Has DNR 

## 2020-09-14 ENCOUNTER — Other Ambulatory Visit: Payer: Self-pay | Admitting: Internal Medicine

## 2020-09-19 ENCOUNTER — Ambulatory Visit (INDEPENDENT_AMBULATORY_CARE_PROVIDER_SITE_OTHER): Payer: Medicare Other | Admitting: Neurology

## 2020-09-19 ENCOUNTER — Encounter: Payer: Self-pay | Admitting: Neurology

## 2020-09-19 VITALS — BP 112/68 | HR 68 | Ht 66.0 in | Wt 186.0 lb

## 2020-09-19 DIAGNOSIS — M459 Ankylosing spondylitis of unspecified sites in spine: Secondary | ICD-10-CM | POA: Diagnosis not present

## 2020-09-19 DIAGNOSIS — G2581 Restless legs syndrome: Secondary | ICD-10-CM

## 2020-09-19 DIAGNOSIS — M2550 Pain in unspecified joint: Secondary | ICD-10-CM | POA: Diagnosis not present

## 2020-09-19 DIAGNOSIS — G479 Sleep disorder, unspecified: Secondary | ICD-10-CM | POA: Diagnosis not present

## 2020-09-19 DIAGNOSIS — Z789 Other specified health status: Secondary | ICD-10-CM

## 2020-09-19 DIAGNOSIS — R202 Paresthesia of skin: Secondary | ICD-10-CM

## 2020-09-19 NOTE — Progress Notes (Signed)
Subjective:    Patient ID: Kirsten Harper is a 75 y.o. female.  HPI     Star Age, MD, PhD [facility] [address] [place], [place] [address]  Dear Kirsten Harper,   I saw your patient, Kirsten Harper, upon your kind request, in my Neurologic clinic today for initial consultation of her leg pain, concern for restless leg syndrome.  The patient is unaccompanied today.  As you know, Kirsten Harper is a 75 year old right-handed woman with an underlying medical history of ankylosing spondylitis with chronic lower back pain, arthritis, chronic kidney disease, COPD, hypertension, anxiety, history of benzodiazepine dependence, history of smoking, history of uveitis, depression, prior history of alcohol use disorder (by chart review, but patient denies), and borderline obesity, who reports an approximately 4-year history of restlessness in her legs which feels like a crawling or drawing sensation.  Symptoms wax and wane, currently she reports a flareup in her arthritis and has joint pain in multiple areas including left shoulder, toe joints, right hand.  She reports that symptoms overlap and are difficult to tease out.  She has twitching before she falls asleep but is not sure if she has leg twitching during sleep.  She does not sleep very well or very much.  Goes to bed around midnight and takes her ropinirole at 10 PM.  She is not sure if she snores, she lives alone.  She takes care of her mom who is in the 90s and has dementia.  Patient worries about memory loss as she has a family history of dementia which also affects her brother.  She endorses significant stressors.  She reports that she has taken care of her mom since her stepfather died last year.  I reviewed your office note from 03/24/2020.  Of note, she takes amitriptyline to help her sleep at night.  She is also on Lexapro.  She had a prescription for hydrocodone for pain associated with her arthritis, but reports that  she does not go to pain clinic as indicated in your note and does not have a prescription for hydrocodone.  She restarted taking amitriptyline recently at 100 mg strength.  Her ropinirole is currently 3 mg.  Her Lexapro is 10 mg, amitriptyline 100 mg.  She has not fallen recently but does report difficulty walking due to joint pain and joint stiffness.  She has a walker but did not bring it today.  She is not currently in the position of pursuing a sleep study because she takes care of her mother.  She would be willing to consider it at a later date.  She has a follow-up appointment scheduled with her rheumatologist and will have blood work with them but is willing to have some blood work today.  She does not drink water typically on a day-to-day basis.  She likes to drink tea and soda, she estimates that she drinks 1 serving each.  She also drinks alcohol daily, about 2 glasses, 8 ounces each for a total of 16 ounces daily.  She denies any history of heavy alcohol use or alcohol use disorder as indicated in the chart.  She does endorse having had benzodiazepine dependence but she is currently no longer on clonazepam.  She was on Seroquel in the past for sleep, 50 mg strength, it exacerbated her restless leg syndrome, she has not been on Seroquel for the past 2 to 3 years. She reports intermittent numbness and tingling in her right hand.  This  is also hurting more with arthritis.  She is worried about her forgetfulness.  She has misplaced her glasses for example.  Her Past Medical History Is Significant For: Past Medical History:  Diagnosis Date  . Ankylosing spondylitis ([place])   . Anxiety   . Arthritis   . Chronic lower back pain   . CKD (chronic kidney disease), stage II    "related to the Ankylosing spondylitis" (04/10/2016)  . COPD (chronic obstructive pulmonary disease) ([place])   . Eye disease   . Hypertension   . PONV (postoperative nausea and vomiting)    "didn't bother me the last time I had it  in ~ 2015"    Her Past Surgical History Is Significant For: Past Surgical History:  Procedure Laterality Date  . BROW LIFT AND BLEPHAROPLASTY Bilateral ~ 2015  . CATARACT EXTRACTION W/ INTRAOCULAR LENS  IMPLANT, BILATERAL Bilateral 2000s  . CYST REMOVAL TRUNK  12/2018  . MANDIBLE SURGERY  ~ 2000   "jaw had moved to left; broke my jaw in 5 places; put titanium in; stripped muscle from bone; wired jaw shut"  . TUBAL LIGATION  1979  . VAGINAL HYSTERECTOMY  1985    Her Family History Is Significant For: Family History  Problem Relation Age of Onset  . Hypertension Mother   . Heart disease Sister   . Diabetes Sister   . Pulmonary fibrosis Brother   . Idiopathic pulmonary fibrosis Brother   . Alzheimer's disease Brother   . Dementia Brother   . Frontotemporal dementia Brother     Her Social History Is Significant For: Social History   Socioeconomic History  . Marital status: Divorced    Spouse name: Not on file  . Number of children: 2  . Years of education: Not on file  . Highest education level: Not on file  Occupational History  . Occupation: Sales promotion account executive for [employer]    Comment: Retired  Tobacco Use  . Smoking status: Light Tobacco Smoker    Years: 51.00    Types: Cigarettes  . Smokeless tobacco: Never Used  Vaping Use  . Vaping Use: Never used  Substance and Sexual Activity  . Alcohol use: Yes    Alcohol/week: 0.0 standard drinks    Comment: occ  . Drug use: No  . Sexual activity: Never  Other Topics Concern  . Not on file  Social History Narrative   2 sons--not in area   Lives on property with her mother Kirsten Harper      Has living will   Would want one of her sons, Kirsten Harper, to be Media planner   Requests DNR   No tube feeds   Social Determinants of Health   Financial Resource Strain: Not on file  Food Insecurity: Not on file  Transportation Needs: Not on file  Physical Activity: Not on file  Stress: Not on file  Social Connections: Not on  file    Her Allergies Are:  Allergies  Allergen Reactions  . Amoxapine And Related Other (See Comments)    (Asendin- "Tricyclic antidepressant") Patient reports it did not work for her  . Seroquel [Quetiapine] Other (See Comments)    Worsened restless legs  . Other Palpitations and Other (See Comments)    Psych med (name not recalled by the patient, but it "starts with a Z")- Worsened restless legs, also (NOT Zyprexa; I asked)  :   Her Current Medications Are:  Outpatient Encounter Medications as of 09/19/2020  Medication Sig  . Adalimumab (  HUMIRA PEN) 40 MG/0.4ML PNKT Inject 40 mg into the skin every 14 (fourteen) days.  Marland Kitchen amLODipine (NORVASC) 5 MG tablet Take 1 tablet (5 mg total) by mouth daily.  . dorzolamide-timolol (COSOPT) 22.3-6.8 MG/ML ophthalmic solution Place 1 drop into both eyes 2 (two) times daily.   Marland Kitchen escitalopram (LEXAPRO) 10 MG tablet Take 10 mg by mouth daily.  Marland Kitchen losartan (COZAAR) 100 MG tablet Take 1 tablet (100 mg total) by mouth daily.  Marland Kitchen LOTEMAX 0.5 % ophthalmic suspension Place 1 drop into both eyes 2 (two) times daily.   Marland Kitchen rOPINIRole (REQUIP) 1 MG tablet TAKE 3 TABLETS (3 MG TOTAL) BY MOUTH AT BEDTIME.  . sulindac (CLINORIL) 150 MG tablet TAKE 1 TABLET BY MOUTH TWICE A DAY AS NEEDED  . [DISCONTINUED] albuterol (VENTOLIN HFA) 108 (90 Base) MCG/ACT inhaler INHALE 2 PUFFS INTO THE LUNGS EVERY 4 HOURS AS NEEDED FOR WHEEZE OR FOR SHORTNESS OF BREATH   No facility-administered encounter medications on file as of 09/19/2020.  :   Review of Systems:  Out of a complete 14 point review of systems, all are reviewed and negative with the exception of these symptoms as listed below:  Review of Systems  Neurological:       Here for consult on worsening RLS. Pt also reports a decline in memory.    Objective:  Neurological Exam  Physical Exam Physical Examination:   Vitals:   09/19/20 0752  BP: 112/68  Pulse: 68  SpO2: 98%   General Examination: The patient is a  very pleasant 75 y.o. female in no acute distress. She appears well-developed and well-nourished and well groomed.   HEENT: Normocephalic, atraumatic, pupils are equal, round and reactive to light.  Extraocular tracking is preserved, face is symmetric with normal facial animation and normal facial sensation to light touch, vibration and temperature.  Speech is clear without dysarthria, hypophonia or voice tremor.  Airway examination reveals severe mouth dryness, tongue protrudes centrally and palate elevates symmetrically, mild airway crowding noted.  No carotid bruits.  Chest: Clear to auscultation without wheezing, rhonchi or crackles noted.  Heart: S1+S2+0, regular and normal without murmurs, rubs or gallops noted.   Abdomen: Soft, non-tender and non-distended with normal bowel sounds appreciated on auscultation.  Extremities: There is no pitting edema in the distal lower extremities bilaterally.   Skin: Warm and dry without trophic changes noted.   Musculoskeletal: exam reveals arthritic changes in both hands, hammertoes, reduced range of motion in both shoulders, left more than right, significant right hand pain and bilateral foot pain and toe pain.    Neurologically:  Mental status: The patient is awake, alert and oriented in all 4 spheres. Her immediate and remote memory, attention, language skills and fund of knowledge are appropriate. There is no evidence of aphasia, agnosia, apraxia or anomia. Speech is clear with normal prosody and enunciation. Thought process is linear. Mood is normal and affect is normal.  Cranial nerves II - XII are as described above under HEENT exam. In addition: shoulder shrug is normal with equal shoulder height noted. Motor exam: Normal bulk, strength exam is limited due to widespread pain.  Normal tone noted.  No drift or tremor but could not stretch out arms completely straight.  Romberg is not tested due to safety concerns. Reflexes are 1+ in the upper  extremities, trace in the knees and absent in both ankles.  She has limited mobility in her ankle joint and limited mobility in her shoulder joints, left worse than  right. Babinski: Toes are flexor bilaterally. Fine motor skills and coordination: Grossly intact but she does have limited mobility in her finger joints in the right upper extremity.  She has limited mobility in her ankle joints.   Cerebellar testing: No dysmetria or intention tremor on finger to nose testing.  Range of motion limited in the upper and lower extremities but overall heel-to-shin unremarkable without dysmetria or intention tremor noted.    Sensory exam: intact to light touch, vibration, temperature sense in the upper and lower extremities.  Gait, station and balance: She stands with difficulty.  Posture is mildly stooped in the lumbar region.  She walks with preserved arm swing and no shuffling.  She is wearing flip-flops.  She did not bring a walking aid.  Assessment and Plan:  Assessment and Plan:  In summary, KIERSTYNN BABICH is a very pleasant 75 y.o.-year old female with an underlying medical history of ankylosing spondylitis with chronic lower back pain, arthritis, chronic kidney disease, COPD, hypertension, anxiety, history of benzodiazepine dependence, history of smoking, history of uveitis, depression, daily alcohol consumption, and borderline obesity, who presents for evaluation of her restless legs syndrome.  She reports symptoms for the past 4 years.  She has been on Requip, currently at 3 mg nightly at 10 PM.  This is already a fairly high dose.  I would not recommend increasing this medication at this time.  She is also on antidepressant medications which can exacerbate restless leg symptoms and PLM's.  These medicines include amitriptyline which is fairly high dose at 100 mg and Lexapro generic 10 mg strength.  She is advised to proceed with blood work today as thyroid disease, and anemia, as well as iron deficiency with  low ferritin can exacerbate RLS symptoms.  She is advised to reduce her daily alcohol consumption and limit herself to up to 4 ounces of wine per day or less.  She is furthermore encouraged to stay better hydrated with water, 6 to 8 cups/day are generally recommended, 8 ounce size each.  Furthermore, she is advised to proceed with a sleep study to rule out an underlying organic sleep disorder, such as sleep disordered breathing and to monitor for PLM's (periodic leg movements of sleep).  She declines a sleep study at this time as she is unable to leave her mother unattended overnight.  She is willing to think about a sleep study.  We will call her with her blood test results to keep her posted and plan a follow-up after she has had a chance to pursue a sleep study.  She may be at risk for underlying obstructive sleep apnea.  I talked to her about this condition and treatment options including a CPAP machine.  We will pick up our discussion after testing.  She has other concerns including significant joint pain, flareup of her arthritis and ankylosing spondylitis.  She has a follow-up appointment pending with her rheumatologist.  She has had intermittent paresthesias in her right hand.  Overall, her neurological exam is largely nonfocal but she does have a limited exam secondary to pain in multiple joints.  We talked about some of her other concerns including her memory concerns.  She is advised to try to strive for 7 to 8 hours of sleep at night.  We talked about the importance of good hydration, good nutrition and physical activity as well.  I think some of the lifestyle changes may also help over time.  She is going to call us when  she is ready to proceed with a sleep study.  We will keep her posted in the interim by phone call as to her blood test results. I answered all her questions today and she was in agreement with the plan above. Thank you very much for allowing me to participate in the care of this nice  patient. If I can be of any further assistance to you please do not hesitate to call me at [phone].  Sincerely,   Star Age, MD, PhD

## 2020-09-19 NOTE — Patient Instructions (Signed)
Restless leg symptoms can be induced are aggravated by certain conditions including thyroid dysfunction and anemia or iron deficiency. Certain medications can induce leg twitching at night, which we call periodic leg movements and disrupt sleep and also exacerbate restless leg symptoms during wakefulness. These medications often include antidepressants, such as amitriptyline and Lexapro.  We will do some blood work today and call you with the test results. I may ask you to start an over-the-counter iron supplement if your storage iron values are low.  I would not recommend increasing the ropinirole as you are already on the higher dose. I think your leg pain is exacerbated by your arthritis and ankylosing spondylitis. I do believe that you can work on certain lifestyle changes.  Please reduce your alcohol consumption from 16 ounces of wine daily to at the most 4 ounces daily. Please reduce your soda and tea intake and increase your water intake, 6 to 8 cups of water per day are recommended, generally speaking, 8 ounce size each. Please also try to get 7 to 8 hours of sleep, you are currently averaging about 5 hours of sleep. You may be at risk for obstructive sleep apnea.  I would recommend a sleep study to rule out underlying obstructive sleep apnea and to monitor your legs for leg twitching which is often associated with restless leg syndrome.  I understand that you are currently not in the position of pursuing a sleep study.  Please call us back when you are ready.  We will keep you posted as to your blood test results by phone call for now and plan a follow-up after you have had a chance to complete a sleep study.

## 2020-09-20 ENCOUNTER — Telehealth: Payer: Self-pay

## 2020-09-20 LAB — CBC
Hematocrit: 39 % (ref 34.0–46.6)
Hemoglobin: 13.4 g/dL (ref 11.1–15.9)
MCH: 31.3 pg (ref 26.6–33.0)
MCHC: 34.4 g/dL (ref 31.5–35.7)
MCV: 91 fL (ref 79–97)
Platelets: 221 10*3/uL (ref 150–450)
RBC: 4.28 x10E6/uL (ref 3.77–5.28)
RDW: 13.1 % (ref 11.7–15.4)
WBC: 7 10*3/uL (ref 3.4–10.8)

## 2020-09-20 LAB — IRON AND TIBC
Iron Saturation: 24 % (ref 15–55)
Iron: 79 ug/dL (ref 27–139)
Total Iron Binding Capacity: 334 ug/dL (ref 250–450)
UIBC: 255 ug/dL (ref 118–369)

## 2020-09-20 LAB — B12 AND FOLATE PANEL
Folate: 6.4 ng/mL (ref >3.0)
Vitamin B-12: 379 pg/mL (ref 232–1245)

## 2020-09-20 LAB — TSH: TSH: 2.79 u[IU]/mL (ref 0.450–4.500)

## 2020-09-20 LAB — FERRITIN: Ferritin: 250 ng/mL — ABNORMAL HIGH (ref 15–150)

## 2020-09-20 NOTE — Progress Notes (Signed)
Office Visit Note  Patient: Kirsten Harper             Date of Birth: 04/14/1946           MRN: [id number]             PCP: Venia Carbon, MD Referring: Venia Carbon, MD Visit Date: 09/21/2020 Occupation: @GUAROCC @  Subjective:  Arthritis (Not doing well, just finished prednisone, total body pain)   History of Present Illness: Kirsten Harper is a 75 y.o. female HLA-B27 positive inflammatory arthritis, uveitis.  She states that she did not to start [place] in September as she did not want to take more immunosuppressive medications.  She has been under a lot of stress due to her family situation.  She has been having increased pain and discomfort all over.  She recently finished the prednisone taper and the pain has returned.  She states her left elbow is again swollen and she would like to have it aspirated.  She states all of her joints are painful.  Activities of Daily Living:  Patient reports morning stiffness for 24 hours.   Patient Denies nocturnal pain.  Difficulty dressing/grooming: Denies Difficulty climbing stairs: Reports Difficulty getting out of chair: Reports Difficulty using hands for taps, buttons, cutlery, and/or writing: Reports  Review of Systems  Constitutional: Positive for fatigue. Negative for night sweats, weight gain and weight loss.  HENT: Positive for mouth dryness. Negative for mouth sores, trouble swallowing, trouble swallowing and nose dryness.   Eyes: Positive for dryness. Negative for pain, redness and visual disturbance.  Respiratory: Negative for cough, shortness of breath and difficulty breathing.   Cardiovascular: Positive for swelling in legs/feet. Negative for chest pain, palpitations, hypertension and irregular heartbeat.  Gastrointestinal: Negative for blood in stool, constipation and diarrhea.  Endocrine: Negative for excessive thirst and increased urination.  Genitourinary: Negative for difficulty urinating and vaginal dryness.   Musculoskeletal: Positive for arthralgias, gait problem, joint pain, joint swelling, muscle weakness and morning stiffness. Negative for myalgias, muscle tenderness and myalgias.  Skin: Negative for color change, rash, hair loss, skin tightness, ulcers and sensitivity to sunlight.  Allergic/Immunologic: Negative for susceptible to infections.  Neurological: Positive for numbness and weakness. Negative for dizziness, memory loss and night sweats.  Hematological: Negative for bruising/bleeding tendency and swollen glands.  Psychiatric/Behavioral: Positive for sleep disturbance. Negative for depressed mood. The patient is not nervous/anxious.     PMFS History:  Patient Active Problem List   Diagnosis Date Noted  . Colitis 07/28/2018  . Preventative health care 05/04/2018  . Advance directive discussed with patient 05/04/2018  . RLS (restless legs syndrome) 04/25/2017  . Mood disorder ([place]) 04/08/2017  . Sensory loss 04/08/2017  . COPD (chronic obstructive pulmonary disease) ([place]) 04/08/2017  . Ankylosing spondylitis of lumbosacral region ([place]) 09/24/2016  . GAD (generalized anxiety disorder) 08/27/2016  . 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 Spondyloarthropathy HLA-B27 positive 06/06/2016  . Iritis 06/06/2016  . High risk medication use 06/06/2016  . Primary osteoarthritis of both hands 06/06/2016  . Smoker 06/06/2016  . COPD exacerbation ([place]) 04/10/2016  . Anxiety 04/10/2016  . Essential hypertension     Past Medical History:  Diagnosis Date  . Ankylosing spondylitis ([place])   . Anxiety   . Arthritis   . Chronic lower back pain   . CKD (chronic kidney disease), stage II    "related to the Ankylosing spondylitis" (04/10/2016)  . COPD (chronic obstructive pulmonary disease) ([place])   .  Eye disease   . Hypertension   . PONV (postoperative nausea and vomiting)    "didn't bother me the last time I had it in ~ 2015"    Family History  Problem Relation Age  of Onset  . Hypertension Mother   . Heart disease Sister   . Diabetes Sister   . Pulmonary fibrosis Brother   . Idiopathic pulmonary fibrosis Brother   . Alzheimer's disease Brother   . Dementia Brother   . Frontotemporal dementia Brother    Past Surgical History:  Procedure Laterality Date  . BROW LIFT AND BLEPHAROPLASTY Bilateral ~ 2015  . CATARACT EXTRACTION W/ INTRAOCULAR LENS  IMPLANT, BILATERAL Bilateral 2000s  . CYST REMOVAL TRUNK  12/2018  . MANDIBLE SURGERY  ~ 2000   "jaw had moved to left; broke my jaw in 5 places; put titanium in; stripped muscle from bone; wired jaw shut"  . TUBAL LIGATION  1979  . VAGINAL HYSTERECTOMY  1985   Social History   Social History Narrative   2 sons--not in area   Lives on property with her mother [name]      Has living will   Would want one of her sons, [name], to be Media planner   Requests DNR   No tube feeds   Immunization History  Administered Date(s) Administered  . Influenza Inj Mdck Quad Pf 04/16/2018  . Influenza, High Dose Seasonal PF 04/09/2019, 02/22/2020  . Influenza-Unspecified 04/09/2019  . Moderna Sars-Covid-2 Vaccination 09/06/2019, 10/05/2019, 03/19/2020  . Pneumococcal Conjugate-13 05/10/2013  . Pneumococcal Polysaccharide-23 09/20/2014  . Tdap 04/30/2011  . Zoster Recombinat (Shingrix) 10/01/2016     Objective: Vital Signs: BP 108/66 (BP Location: Left Arm, Patient Position: Sitting, Cuff Size: Normal)   Pulse 80   Resp 16   Ht $R'5\' 6"'KL$  (1.676 m)   Wt 184 lb 6.4 oz (83.6 kg)   BMI 29.76 kg/m    Physical Exam Vitals and nursing note reviewed.  Constitutional:      Appearance: She is well-developed.  HENT:     Head: Normocephalic and atraumatic.  Eyes:     Conjunctiva/sclera: Conjunctivae normal.  Cardiovascular:     Rate and Rhythm: Normal rate and regular rhythm.     Heart sounds: Normal heart sounds.  Pulmonary:     Effort: Pulmonary effort is normal.     Breath sounds: Normal breath  sounds.  Abdominal:     General: Bowel sounds are normal.     Palpations: Abdomen is soft.  Musculoskeletal:     Cervical back: Normal range of motion.  Lymphadenopathy:     Cervical: No cervical adenopathy.  Skin:    General: Skin is warm and dry.     Capillary Refill: Capillary refill takes less than 2 seconds.  Neurological:     Mental Status: She is alert and oriented to person, place, and time.  Psychiatric:        Behavior: Behavior normal.      Musculoskeletal Exam: C-spine was in good range of motion.  Shoulder joints, elbow joints were Strasberg range of motion.  She had palpable subcutaneous nodules over bilateral elbows.  She had a small left olecranon bursitis.  She had tenderness over MCPs and PIPs as described below.  Right knee joint was swollen.  CDAI Exam: CDAI Score: 13  Patient Global: 4 mm; Provider Global: 6 mm Swollen: 5 ; Tender: 7  Joint Exam 09/21/2020      Right  Left  Wrist   Tender  Swollen Tender  MCP 1   Tender     MCP 2   Tender     PIP 4  Swollen Tender  Swollen Tender  PIP 5  Swollen Tender     Knee  Swollen         Investigation: No additional findings.  Imaging: XR Hand 2 View Left  Result Date: 09/21/2020 Juxta-articular osteopenia was noted.  Narrowing of all MCP PIP and DIP joints was noted.  Narrowing of all intercarpal and radiocarpal joints was noted.  Narrowing, subluxation erosive changes of the first MCP joint was noted. 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  XR Hand 2 View Right  Result Date: 09/21/2020 Juxta-articular osteopenia was noted.  Severe narrowing of first MCP joint with erosive changes and subluxation was noted.  Narrowing of second and third MCP joint was noted.Narrowing of all PIP joints was noted.  Erosion at the base of the fifth metacarpal was noted.  Intercarpal and radiocarpal joint space narrowing was noted. Impression: These findings are consistent with erosive  rheumatoid arthritis.   Recent Labs: Lab Results  Component Value Date   WBC 7.0 09/19/2020   HGB 13.4 09/19/2020   PLT 221 09/19/2020   NA 133 (L) 06/22/2020   K 4.4 06/22/2020   CL 99 06/22/2020   CO2 28 06/22/2020   GLUCOSE 109 (H) 06/22/2020   BUN 13 06/22/2020   CREATININE 0.68 06/22/2020   BILITOT 1.0 06/22/2020   ALKPHOS 98 07/27/2018   AST 16 06/22/2020   ALT 18 06/22/2020   PROT 7.3 06/22/2020   ALBUMIN 4.3 08/03/2018   CALCIUM 10.0 06/22/2020   GFRAA 101 06/22/2020   QFTBGOLDPLUS NEGATIVE 12/23/2019    Speciality Comments: Methotrexate-nausea, [place] started March 21, 2020  Procedures:  No procedures performed Allergies: Amoxapine and related, Seroquel [quetiapine], and Other   Assessment / Plan:     Visit Diagnoses: Rheumatoid arthritis of multiple sites with negative rheumatoid factor (HCC)-patient continues to have inflammatory arthritis in her joints.  She had recent flare with severe pain and discomfort in all of her joints.  She was given a prednisone taper without much relief.  On my examination she had synovitis over several of her MCPs, wrist joints and PIP joints.  I convinced her to get x-rays today.  She has refused x-rays several times  the past during the previous visits.  X-ray of bilateral hands were obtained which showed erosive changes in her bilateral hands consistent with rheumatoid arthritis.  She is currently on Humira monotherapy.  She did not start [place] after the last visit.  Indications side effects contraindications of her Arava revised today.  Patient is willing to start on Arava.  My plan is to start her on Arava 10 mg p.o. daily for 2 weeks.  If her labs are normal in 2 weeks then we will increase the dose of Arava to 20 mg p.o. daily.  Labs will be checked in 2 weeks and then 2 months.  Rheumatoid nodulosis (HCC)-she also has subcutaneous nodules over bilateral elbows consistent with her rheumatoid nodulosis.  She has had recurrent left  olecranon bursitis.  Telecron bursitis has improved after prednisone taper.  Pain in both hands - Plan: XR Hand 2 View Right, XR Hand 2 View Left, x-ray findings were consistent with erosive rheumatoid arthritis.  I will obtain following labs today.  Sedimentation rate, Rheumatoid factor, Cyclic citrul peptide antibody, IgG, 14-3-3 eta Protein  Pain in both feet -she has occasional  discomfort in her feet.  I will obtain x-rays today.  Plan: XR Foot 2 Views Right, XR Foot 2 Views Left.  Severe erosive changes were noted in all MTP joints with invagination consistent with erosive rheumatoid arthritis.  DDD (degenerative disc disease), lumbar-she was given the diagnosis of ankylosing spondylitis in the past due to uveitis and positive HLA-B27 and lower back pain.  She had no tenderness over SI joints.  X-rays of her lumbar spine from August 26, 2019 were consistent with degenerative disc disease.  No syndesmophytes were noted.  No SI joint sclerosis was noted.  I do not believe that she has ankylosing spondylitis.  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  She is doing better on Humira.  She has been followed by Dr. Dwana Melena.  HLA B 27 Positive   High risk medication use - Humira 40 mg subcu every 14 days.  Arava added March 21, 2020 - pt. did not start.  Methotrexate caused nausea in the past - Plan: QuantiFERON-TB Gold Plus, COMPLETE METABOLIC PANEL WITH GFR  Olecranon bursitis of left elbow - Aspirated and injected on March 21, 2020.  Synovial fluid negative for culture.  The bursitis has resolved but she does have some thickening in that area.  Primary osteoarthritis of both hands-she has underlying osteoarthritis in her hands.  Primary osteoarthritis of both knees-she has moderate to severe osteoarthritis in her bilateral knee joints.  Chronic midline low back pain without sciatica-she continues to have chronic lower back pain due to disc disease.  History of  COPD  History of hypertension-her blood pressure is normal today.  Smoker-smoking cessation was discussed.  Association of smoking with rheumatoid arthritis was discussed.  History of anxiety  Orders: Orders Placed This Encounter  Procedures  . XR Hand 2 View Right  . XR Hand 2 View Left  . XR Foot 2 Views Right  . XR Foot 2 Views Left  . QuantiFERON-TB Gold Plus  . COMPLETE METABOLIC PANEL WITH GFR  . Sedimentation rate  . Rheumatoid factor  . Cyclic citrul peptide antibody, IgG  . 14-3-3 eta Protein   Meds ordered this encounter  Medications  . leflunomide (ARAVA) 10 MG tablet    Sig: Take 1 tablet by mouth daily for 2 weeks, if labs are stable then increase to 2 tablets by mouth daily.    Dispense:  60 tablet    Refill:  0      Follow-Up Instructions: Return in about 2 months (around 11/21/2020) for Osteoarthritis, Ankylosing spondylitis.   Bo Merino, MD  Note - This record has been created using Editor, commissioning.  Chart creation errors have been sought, but may not always  have been located. Such creation errors do not reflect on  the standard of medical care.

## 2020-09-20 NOTE — Progress Notes (Signed)
Please call patient and advise her that her labs were okay, no evidence of anemia or iron deficiency.

## 2020-09-20 NOTE — Telephone Encounter (Signed)
I called patient, patient is having total body aches, patient wants left elbow aspirated, patient scheduled 09/21/2020

## 2020-09-20 NOTE — Telephone Encounter (Signed)
I called the pt and advised of results. Pt verbalized understanding.

## 2020-09-20 NOTE — Telephone Encounter (Signed)
Patient left a voicemail stating her ankylosing spondylitis is much worse on the right side.  Patient states she took Prednisone which didn't help at all.  Patient states this is the first time it hurts like a "toothache."  Patient requested a return call.

## 2020-09-20 NOTE — Telephone Encounter (Signed)
-----   Message from Star Age, MD sent at 09/20/2020  1:28 PM EST ----- Please call patient and advise her that her labs were okay, no evidence of anemia or iron deficiency.

## 2020-09-21 ENCOUNTER — Ambulatory Visit: Payer: Self-pay

## 2020-09-21 ENCOUNTER — Encounter: Payer: Self-pay | Admitting: Rheumatology

## 2020-09-21 ENCOUNTER — Ambulatory Visit (INDEPENDENT_AMBULATORY_CARE_PROVIDER_SITE_OTHER): Payer: Medicare Other | Admitting: Rheumatology

## 2020-09-21 ENCOUNTER — Other Ambulatory Visit: Payer: Self-pay

## 2020-09-21 VITALS — BP 108/66 | HR 80 | Resp 16 | Ht 66.0 in | Wt 184.4 lb

## 2020-09-21 DIAGNOSIS — M0630 Rheumatoid nodule, unspecified site: Secondary | ICD-10-CM | POA: Diagnosis not present

## 2020-09-21 DIAGNOSIS — M170 Bilateral primary osteoarthritis of knee: Secondary | ICD-10-CM

## 2020-09-21 DIAGNOSIS — M19042 Primary osteoarthritis, left hand: Secondary | ICD-10-CM

## 2020-09-21 DIAGNOSIS — M0609 Rheumatoid arthritis without rheumatoid factor, multiple sites: Secondary | ICD-10-CM

## 2020-09-21 DIAGNOSIS — M545 Low back pain, unspecified: Secondary | ICD-10-CM

## 2020-09-21 DIAGNOSIS — H209 Unspecified iridocyclitis: Secondary | ICD-10-CM

## 2020-09-21 DIAGNOSIS — Z8659 Personal history of other mental and behavioral disorders: Secondary | ICD-10-CM

## 2020-09-21 DIAGNOSIS — M47819 Spondylosis without myelopathy or radiculopathy, site unspecified: Secondary | ICD-10-CM

## 2020-09-21 DIAGNOSIS — M457 Ankylosing spondylitis of lumbosacral region: Secondary | ICD-10-CM

## 2020-09-21 DIAGNOSIS — M79672 Pain in left foot: Secondary | ICD-10-CM

## 2020-09-21 DIAGNOSIS — M79642 Pain in left hand: Secondary | ICD-10-CM | POA: Diagnosis not present

## 2020-09-21 DIAGNOSIS — Z8679 Personal history of other diseases of the circulatory system: Secondary | ICD-10-CM

## 2020-09-21 DIAGNOSIS — Z87891 Personal history of nicotine dependence: Secondary | ICD-10-CM

## 2020-09-21 DIAGNOSIS — M79641 Pain in right hand: Secondary | ICD-10-CM

## 2020-09-21 DIAGNOSIS — M79671 Pain in right foot: Secondary | ICD-10-CM

## 2020-09-21 DIAGNOSIS — Z8709 Personal history of other diseases of the respiratory system: Secondary | ICD-10-CM

## 2020-09-21 DIAGNOSIS — Z79899 Other long term (current) drug therapy: Secondary | ICD-10-CM

## 2020-09-21 DIAGNOSIS — M19041 Primary osteoarthritis, right hand: Secondary | ICD-10-CM

## 2020-09-21 DIAGNOSIS — G8929 Other chronic pain: Secondary | ICD-10-CM

## 2020-09-21 DIAGNOSIS — F17200 Nicotine dependence, unspecified, uncomplicated: Secondary | ICD-10-CM

## 2020-09-21 DIAGNOSIS — R229 Localized swelling, mass and lump, unspecified: Secondary | ICD-10-CM

## 2020-09-21 DIAGNOSIS — M5136 Other intervertebral disc degeneration, lumbar region: Secondary | ICD-10-CM

## 2020-09-21 DIAGNOSIS — M51369 Other intervertebral disc degeneration, lumbar region without mention of lumbar back pain or lower extremity pain: Secondary | ICD-10-CM

## 2020-09-21 DIAGNOSIS — M7022 Olecranon bursitis, left elbow: Secondary | ICD-10-CM

## 2020-09-21 MED ORDER — LEFLUNOMIDE 10 MG PO TABS: ORAL_TABLET | ORAL | 0 refills | Status: DC

## 2020-09-21 NOTE — Patient Instructions (Addendum)
Leflunomide tablets What is this medicine? LEFLUNOMIDE (le FLOO na mide) is for rheumatoid arthritis. This medicine may be used for other purposes; ask your health care provider or pharmacist if you have questions. COMMON BRAND NAME(S): Arava What should I tell my health care provider before I take this medicine? They need to know if you have any of these conditions:  diabetes  have a fever or infection  high blood pressure  immune system problems  kidney disease  liver disease  low blood cell counts, like low Monnier cell, platelet, or red cell counts  lung or breathing disease, like asthma  recently received or scheduled to receive a vaccine  receiving treatment for cancer  skin conditions or sensitivity  tingling of the fingers or toes, or other nerve disorder  tuberculosis  an unusual or allergic reaction to leflunomide, teriflunomide, other medicines, food, dyes, or preservatives  pregnant or trying to get pregnant  breast-feeding How should I use this medicine? Take this medicine by mouth with a full glass of water. Follow the directions on the prescription label. Take your medicine at regular intervals. Do not take your medicine more often than directed. Do not stop taking except on your doctor's advice. Talk to your pediatrician regarding the use of this medicine in children. Special care may be needed. Overdosage: If you think you have taken too much of this medicine contact a poison control center or emergency room at once. NOTE: This medicine is only for you. Do not share this medicine with others. What if I miss a dose? If you miss a dose, take it as soon as you can. If it is almost time for your next dose, take only that dose. Do not take double or extra doses. What may interact with this medicine? Do not take this medicine with any of the following medications:  teriflunomide This medicine may also interact with the following  medications:  alosetron  birth control pills  caffeine  cefaclor  certain medicines for diabetes like nateglinide, repaglinide, rosiglitazone, pioglitazone  certain medicines for high cholesterol like atorvastatin, pravastatin, rosuvastatin, simvastatin  charcoal  cholestyramine  ciprofloxacin  duloxetine  furosemide  ketoprofen  live virus vaccines  medicines that increase your risk for infection  methotrexate  mitoxantrone  paclitaxel  penicillin  theophylline  tizanidine  warfarin This list may not describe all possible interactions. Give your health care provider a list of all the medicines, herbs, non-prescription drugs, or dietary supplements you use. Also tell them if you smoke, drink alcohol, or use illegal drugs. Some items may interact with your medicine. What should I watch for while using this medicine? Visit your health care provider for regular checks on your progress. Tell your doctor or health care provider if your symptoms do not start to get better or if they get worse. You may need blood work done while you are taking this medicine. This medicine may cause serious skin reactions. They can happen weeks to months after starting the medicine. Contact your health care provider right away if you notice fevers or flu-like symptoms with a rash. The rash may be red or purple and then turn into blisters or peeling of the skin. Or, you might notice a red rash with swelling of the face, lips or lymph nodes in your neck or under your arms. This medicine may stay in your body for up to 2 years after your last dose. Tell your doctor about any unusual side effects or symptoms. A medicine can  be given to help lower your blood levels of this medicine more quickly. Women must use effective birth control with this medicine. There is a potential for serious side effects to an unborn child. Do not become pregnant while taking this medicine. Inform your doctor if you wish  to become pregnant. This medicine remains in your blood after you stop taking it. You must continue using effective birth control until the blood levels have been checked and they are low enough. A medicine can be given to help lower your blood levels of this medicine more quickly. Immediately talk to your doctor if you think you may be pregnant. You may need a pregnancy test. Talk to your health care provider or pharmacist for more information. You should not receive certain vaccines during your treatment and for a certain time after your treatment with this medication ends. Talk to your health care provider for more information. What side effects may I notice from receiving this medicine? Side effects that you should report to your doctor or health care professional as soon as possible:  allergic reactions like skin rash, itching or hives, swelling of the face, lips, or tongue  breathing problems  cough  increased blood pressure  low blood counts - this medicine may decrease the number of [place] blood cells and platelets. You may be at increased risk for infections and bleeding.  pain, tingling, numbness in the hands or feet  rash, fever, and swollen lymph nodes  redness, blistering, peeing or loosening of the skin, including inside the mouth  signs of decreased platelets or bleeding - bruising, pinpoint red spots on the skin, black, tarry stools, blood in urine  signs of infection - fever or chills, cough, sore throat, pain or trouble passing urine  signs and symptoms of liver injury like dark yellow or brown urine; general ill feeling or flu-like symptoms; light-colored stools; loss of appetite; nausea; right upper belly pain; unusually weak or tired; yellowing of the eyes or skin  trouble passing urine or change in the amount of urine  vomiting Side effects that usually do not require medical attention (report to your doctor or health care professional if they continue or are  bothersome):  diarrhea  hair thinning or loss  headache  nausea  tiredness This list may not describe all possible side effects. Call your doctor for medical advice about side effects. You may report side effects to FDA at 1-800-FDA-1088. Where should I keep my medicine? Keep out of the reach of children. Store at room temperature between 15 and 30 degrees C (59 and 86 degrees F). Protect from moisture and light. Throw away any unused medicine after the expiration date. NOTE: This sheet is a summary. It may not cover all possible information. If you have questions about this medicine, talk to your doctor, pharmacist, or health care provider.  2021 Elsevier/Gold Standard (2018-10-02 15:06:48)  Standing Labs We placed an order today for your standing lab work.   Please have your standing labs drawn in 2 weeks x 2 and then 2 months  If possible, please have your labs drawn 2 weeks prior to your appointment so that the provider can discuss your results at your appointment.  We have open lab daily Monday through Thursday from 1:30-4:30 PM and Friday from 1:30-4:00 PM at the office of Dr. Bo Merino, [facility].   Please be advised, all patients with office appointments requiring lab work will take precedents over walk-in lab work.  If possible,  please come for your lab work on Monday and Friday afternoons, as you may experience shorter wait times. The office is located at [address], [place], [place], [place] [address] No appointment is necessary.   Labs are drawn by [facility]. Please bring your co-pay at the time of your lab draw.  You may receive a bill from [facility] for your lab work.  If you wish to have your labs drawn at another location, please call the office 24 hours in advance to send orders.  If you have any questions regarding directions or hours of operation,  please call [phone].   As a reminder, please drink plenty of water prior to coming for  your lab work. Thanks!

## 2020-09-25 ENCOUNTER — Emergency Department (HOSPITAL_COMMUNITY)
Admission: EM | Admit: 2020-09-25 | Discharge: 2020-09-25 | Disposition: A | Discharge status: Home / Self Care | Payer: Medicare Other | Attending: Emergency Medicine | Admitting: Emergency Medicine

## 2020-09-25 ENCOUNTER — Other Ambulatory Visit: Payer: Self-pay

## 2020-09-25 ENCOUNTER — Emergency Department (HOSPITAL_COMMUNITY): Payer: Medicare Other

## 2020-09-25 DIAGNOSIS — R2232 Localized swelling, mass and lump, left upper limb: Secondary | ICD-10-CM | POA: Diagnosis not present

## 2020-09-25 DIAGNOSIS — Z5321 Procedure and treatment not carried out due to patient leaving prior to being seen by health care provider: Secondary | ICD-10-CM | POA: Insufficient documentation

## 2020-09-25 DIAGNOSIS — R202 Paresthesia of skin: Secondary | ICD-10-CM | POA: Insufficient documentation

## 2020-09-25 NOTE — ED Triage Notes (Signed)
Pt. Stated, I have ankylosis spondylosis

## 2020-09-25 NOTE — ED Triage Notes (Signed)
Pt. Stated, My left thumb is swollen and numb, and hurts. I went to Rheumatologist last week. I have HLB ?? Rheumatologist saif it is arthritis. The thumb started hurting about a week or 2 ago.

## 2020-09-25 NOTE — ED Notes (Signed)
Pt left due to not Being seen quick enough

## 2020-09-29 ENCOUNTER — Other Ambulatory Visit: Payer: Self-pay

## 2020-09-29 MED ORDER — PREDNISONE 5 MG PO TABS: ORAL_TABLET | ORAL | 0 refills | Status: DC

## 2020-09-29 NOTE — Telephone Encounter (Signed)
Advised patient we are sending in a prednisone prescription 20 mg tapering by 5 mg every 4 days.  advised the patient to take prednisone in the morning with food. Patient verbalized understanding. Patient states she has started arava.   Patient reports numbness in her finger tips that started approximately one week prior to starting arava. Patient would like to know if there are any recommendations for the numbness? Thanks!   Please review prednisone prescription and send to the pharmacy.

## 2020-09-29 NOTE — Telephone Encounter (Signed)
Ok to send in prednisone 20 mg tapering by 5 mg every 4 days.  Please advise the patient to take prednisone in the morning with food. Please clarify if she has started on [place]?

## 2020-09-29 NOTE — Telephone Encounter (Signed)
Please advise 

## 2020-09-29 NOTE — Telephone Encounter (Signed)
Patient called stating she has not been able to sleep due to the pain in her hands, wrists, elbows, and shoulders.  Patient states she is not able to bend her fingers.  Patient states "she needs something to help with the pain" and requested a return call.

## 2020-09-30 ENCOUNTER — Other Ambulatory Visit: Payer: Self-pay | Admitting: Internal Medicine

## 2020-10-03 LAB — COMPLETE METABOLIC PANEL WITH GFR
AG Ratio: 1.4 (calc) (ref 1.0–2.5)
ALT: 14 U/L (ref 6–29)
AST: 16 U/L (ref 10–35)
Albumin: 4.2 g/dL (ref 3.6–5.1)
Alkaline phosphatase (APISO): 98 U/L (ref 37–153)
BUN: 11 mg/dL (ref 7–25)
CO2: 28 mmol/L (ref 20–32)
Calcium: 9.8 mg/dL (ref 8.6–10.4)
Chloride: 103 mmol/L (ref 98–110)
Creat: 0.72 mg/dL (ref 0.60–0.93)
GFR, Est African American: 96 mL/min/{1.73_m2} (ref >=60)
GFR, Est Non African American: 83 mL/min/{1.73_m2} (ref >=60)
Globulin: 3 g/dL (calc) (ref 1.9–3.7)
Glucose, Bld: 103 mg/dL — ABNORMAL HIGH (ref 65–99)
Potassium: 4.3 mmol/L (ref 3.5–5.3)
Sodium: 138 mmol/L (ref 135–146)
Total Bilirubin: 0.9 mg/dL (ref 0.2–1.2)
Total Protein: 7.2 g/dL (ref 6.1–8.1)

## 2020-10-03 LAB — CYCLIC CITRUL PEPTIDE ANTIBODY, IGG: Cyclic Citrullin Peptide Ab: <16 UNITS

## 2020-10-03 LAB — 14-3-3 ETA PROTEIN: 14-3-3 eta Protein: <0.2 ng/mL (ref <0.2)

## 2020-10-03 LAB — SEDIMENTATION RATE: Sed Rate: 22 mm/h (ref 0–30)

## 2020-10-03 LAB — RHEUMATOID FACTOR: Rheumatoid fact SerPl-aCnc: <14 IU/mL (ref <14)

## 2020-10-05 ENCOUNTER — Telehealth: Payer: Self-pay

## 2020-10-05 NOTE — Telephone Encounter (Signed)
Opened in error

## 2020-10-05 NOTE — Progress Notes (Signed)
Office Visit Note  Patient: Kirsten Harper             Date of Birth: 1946/04/09           MRN: [id number]             PCP: Venia Carbon, MD Referring: Venia Carbon, MD Visit Date: 10/06/2020 Occupation: @GUAROCC @  Subjective:  Right hand pain   History of Present Illness: Kirsten Harper is a 75 y.o. female with history of seronegative rheumatoid arthritis, uveitis, and osteoarthritis.  She is on humira 40 mg sq injections every 14 days and arava 10 mg daily. A prednisone taper was sent to the pharmacy on 09/29/20 starting at 20 mg tapering by 5 mg every 4 days.  She is currently on 10 mg daily.  She was started on arava at her last office visit on 09/21/20.  She is tolerating arava without any side effects.  She has not noticed any clinical improvement after starting on arava 10 days ago.  Her mobility and generalized arthralgias have started to improve since starting on prednisone.  She continues to have pain, swelling, and stiffness in the right hand. She is experiencing some numbness in the right thumb which has started to concern her.  She has also noticed decreased grip strength bilaterally.   According to the patient she had a birthday celebration last night, and she drank several glasses of wine.  She is concerned about her liver enzymes being elevated with her lab work today.   Activities of Daily Living:  Patient reports morning stiffness for 0 minutes.   Patient Reports nocturnal pain.  Difficulty dressing/grooming: Denies Difficulty climbing stairs: Denies Difficulty getting out of chair: Denies Difficulty using hands for taps, buttons, cutlery, and/or writing: Reports  Review of Systems  Constitutional: Positive for fatigue.  HENT: Negative for mouth sores, mouth dryness and nose dryness.   Eyes: Negative for pain, redness, itching and dryness.  Respiratory: Negative for shortness of breath and difficulty breathing.   Cardiovascular: Negative for chest pain and  palpitations.  Gastrointestinal: Negative for blood in stool, constipation and diarrhea.  Endocrine: Negative for increased urination.  Genitourinary: Negative for difficulty urinating.  Musculoskeletal: Positive for arthralgias, joint pain, joint swelling and muscle weakness. Negative for myalgias, morning stiffness, muscle tenderness and myalgias.  Skin: Negative for color change, rash and redness.  Allergic/Immunologic: Negative for susceptible to infections.  Neurological: Positive for numbness. Negative for dizziness, headaches, memory loss and weakness.  Hematological: Negative for bruising/bleeding tendency.  Psychiatric/Behavioral: Negative for confusion. The patient is nervous/anxious.     PMFS History:  Patient Active Problem List   Diagnosis Date Noted  . Colitis 07/28/2018  . Preventative health care 05/04/2018  . Advance directive discussed with patient 05/04/2018  . RLS (restless legs syndrome) 04/25/2017  . Mood disorder ([place]) 04/08/2017  . Sensory loss 04/08/2017  . COPD (chronic obstructive pulmonary disease) ([place]) 04/08/2017  . Ankylosing spondylitis of lumbosacral region ([place]) 09/24/2016  . GAD (generalized anxiety disorder) 08/27/2016  . 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 Spondyloarthropathy HLA-B27 positive 06/06/2016  . Iritis 06/06/2016  . High risk medication use 06/06/2016  . Primary osteoarthritis of both hands 06/06/2016  . Smoker 06/06/2016  . COPD exacerbation ([place]) 04/10/2016  . Anxiety 04/10/2016  . Essential hypertension     Past Medical History:  Diagnosis Date  . Ankylosing spondylitis ([place])   . Anxiety   . Arthritis   . Chronic lower  back pain   . CKD (chronic kidney disease), stage II    "related to the Ankylosing spondylitis" (04/10/2016)  . COPD (chronic obstructive pulmonary disease) ([place])   . Eye disease   . Hypertension   . PONV (postoperative nausea and vomiting)    "didn't bother me the last time I had  it in ~ 2015"    Family History  Problem Relation Age of Onset  . Hypertension Mother   . Heart disease Sister   . Diabetes Sister   . Pulmonary fibrosis Brother   . Idiopathic pulmonary fibrosis Brother   . Alzheimer's disease Brother   . Dementia Brother   . Frontotemporal dementia Brother    Past Surgical History:  Procedure Laterality Date  . BROW LIFT AND BLEPHAROPLASTY Bilateral ~ 2015  . CATARACT EXTRACTION W/ INTRAOCULAR LENS  IMPLANT, BILATERAL Bilateral 2000s  . CYST REMOVAL TRUNK  12/2018  . MANDIBLE SURGERY  ~ 2000   "jaw had moved to left; broke my jaw in 5 places; put titanium in; stripped muscle from bone; wired jaw shut"  . TUBAL LIGATION  1979  . VAGINAL HYSTERECTOMY  1985   Social History   Social History Narrative   2 sons--not in area   Lives on property with her mother [name]      Has living will   Would want one of her sons, [name], to be Media planner   Requests DNR   No tube feeds   Immunization History  Administered Date(s) Administered  . Influenza Inj Mdck Quad Pf 04/16/2018  . Influenza, High Dose Seasonal PF 04/09/2019, 02/22/2020  . Influenza-Unspecified 04/09/2019  . Moderna Sars-Covid-2 Vaccination 09/06/2019, 10/05/2019, 03/19/2020  . Pneumococcal Conjugate-13 05/10/2013  . Pneumococcal Polysaccharide-23 09/20/2014  . Tdap 04/30/2011  . Zoster Recombinat (Shingrix) 10/01/2016     Objective: Vital Signs: BP (!) 157/85 (BP Location: Right Arm, Patient Position: Sitting, Cuff Size: Normal)   Pulse (!) 103   Ht 5' 6.5" (1.689 m)   Wt 188 lb (85.3 kg)   BMI 29.89 kg/m    Physical Exam Vitals and nursing note reviewed.  Constitutional:      Appearance: She is well-developed.  HENT:     Head: Normocephalic and atraumatic.  Eyes:     Conjunctiva/sclera: Conjunctivae normal.  Cardiovascular:     Rate and Rhythm: Normal rate.  Pulmonary:     Effort: Pulmonary effort is normal.  Abdominal:     Palpations: Abdomen is soft.   Musculoskeletal:     Cervical back: Normal range of motion.  Skin:    General: Skin is warm and dry.     Capillary Refill: Capillary refill takes less than 2 seconds.  Neurological:     Mental Status: She is alert and oriented to person, place, and time.  Psychiatric:        Behavior: Behavior normal.      Musculoskeletal Exam: C-spine, thoracic spine, and lumbar spine good ROM.  No midline spinal tenderness.  No SI joint tenderness.  Shoulder joints, elbow joints, and wrist joints good ROM with no discomfort.  Tenderness and synovitis over several MCP and PIP joints as described below. Subcutaneous nodules on the extensor surface of both elbows consistent with rheumatoid nodulosis. Thickening of the left olecranon bursa but no tenderness, drainage, or erythema noted.  Hip joints good ROM with no discomfort.  Knee joints good ROM with discomfort in the right knee.  Warmth and swelling in the right knee joint noted.  Ankle  joints have good ROM with no tenderness or swelling.   CDAI Exam: CDAI Score: 15.2  Patient Global: 6 mm; Provider Global: 6 mm Swollen: 6 ; Tender: 8  Joint Exam 10/06/2020      Right  Left  Wrist   Tender   Tender  MCP 1   Tender     MCP 2  Swollen Tender     PIP 2  Swollen Tender     PIP 3   Tender     PIP 4  Swollen Tender  Swollen   PIP 5  Swollen Tender     Knee  Swollen         Investigation: No additional findings.  Imaging: DG Finger Thumb Right  Result Date: 09/25/2020 CLINICAL DATA:  Swelling.  Pain and swelling for weeks. EXAM: RIGHT THUMB 2+V COMPARISON:  Hand radiograph 09/21/2020 FINDINGS: Osteoarthritis of the thumb metacarpal phalangeal joint with joint space narrowing, subchondral cystic changes and osteophytes. No significant change from prior exam. Mild osteoarthritis of the thumb interphalangeal joint. No frank erosion or bony destruction. Mild soft tissue edema about the proximal thumb. No soft tissue calcifications. IMPRESSION:  Osteoarthritis of the thumb, prominent at the metacarpal phalangeal joint, unchanged from prior exam. Electronically Signed   By: Keith Rake M.D.   On: 09/25/2020 17:41   XR Foot 2 Views Left  Result Date: 09/21/2020 Juxta-articular osteopenia was noted.  Narrowing of all MTP joints with invagination of most MTP joints was noted.  Erosive changes were noted in all MTP joints.  Subluxation of PIP and DIP joints was noted.  Dorsal spurring was noted.  No intertarsal, tibiotalar or subtalar joint space narrowing was noted.  Inferior and posterior calcaneal spurs were noted. Impression: These findings are consistent with severe erosive rheumatoid arthritis.  XR Foot 2 Views Right  Result Date: 09/21/2020 Juxta-articular osteopenia was noted.  Narrowing of all MTP joints with invagination of MTP joints was noted.  Subluxation of PIP and DIP joints was noted.  Erosive changes were noted in the first fourth and fifth MTP joints.  No significant intertarsal, tibiotalar or subtalar joint space narrowing was noted.  Inferior and posterior calcaneal spurs were noted. Impression: These findings are consistent with severe erosive rheumatoid arthritis.  XR Hand 2 View Left  Result Date: 09/21/2020 Juxta-articular osteopenia was noted.  Narrowing of all MCP PIP and DIP joints was noted.  Narrowing of all intercarpal and radiocarpal joints was noted.  Narrowing, subluxation erosive changes of the first MCP joint was noted. 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  XR Hand 2 View Right  Result Date: 09/21/2020 Juxta-articular osteopenia was noted.  Severe narrowing of first MCP joint with erosive changes and subluxation was noted.  Narrowing of second and third MCP joint was noted.Narrowing of all PIP joints was noted.  Erosion at the base of the fifth metacarpal was noted.  Intercarpal and radiocarpal joint space narrowing was noted. Impression: These findings are consistent  with erosive rheumatoid arthritis.   Recent Labs: Lab Results  Component Value Date   WBC 7.0 09/19/2020   HGB 13.4 09/19/2020   PLT 221 09/19/2020   NA 138 09/21/2020   K 4.3 09/21/2020   CL 103 09/21/2020   CO2 28 09/21/2020   GLUCOSE 103 (H) 09/21/2020   BUN 11 09/21/2020   CREATININE 0.72 09/21/2020   BILITOT 0.9 09/21/2020   ALKPHOS 98 07/27/2018   AST 16 09/21/2020   ALT 14 09/21/2020  PROT 7.2 09/21/2020   ALBUMIN 4.3 08/03/2018   CALCIUM 9.8 09/21/2020   GFRAA 96 09/21/2020   QFTBGOLDPLUS NEGATIVE 12/23/2019    Speciality Comments: Methotrexate-nausea, [place] started March 21, 2020 Humira January 22, 2017  Procedures:  No procedures performed Allergies: Amoxapine and related, Seroquel [quetiapine], and Other   Assessment / Plan:     Visit Diagnoses: Rheumatoid arthritis of multiple sites with negative rheumatoid factor ([place]) - Erosive changes, RF-, anti-CCP-, 14-3-3 eta-: She has ongoing joint tenderness and synovitis in multiple joints as described above. Warmth and swelling in the right knee noted on exam.  She has difficulty making a complete fist bilaterally, R>L.  X-rays of both hands and feet were obtained at her last office visit on 09/21/2020 which were consistent with severe erosive rheumatoid arthritis.   She remains on Humira 40 mg sq injections every 14 days and was started on Arava 10 mg 1 tablet by mouth daily 10 days ago. She has been tolerating Arava without any side effects.  She has not noticed any clinical improvement on combination therapy yet.  She has noticed an improvement in her mobility and generalized arthralgias since starting on prednisone 20 mg daily (tapering by 5 mg every 4 days) on 09/29/20. She is currently on 10 mg of prednisone daily. Discussed the patients case with Dr. Estanislado Pandy today and she agrees with the plan.  I will inform the patient that we will keep her on prednisone 10 mg daily and to hold sulindac while taking prednisone. She  will remain on prednisone 10 mg daily for 1 month and then she will taper by 2.5 mg every 2 weeks. She is due to update lab work today.  Orders for CBC and CMP released.  If her lab work is stable she will increase arava to 20 mg by mouth daily.  She will follow up in 6-8 weeks to assess her full response to combination therapy.  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   High risk medication use - Humira 40 mg sq injections every 14 days.  She was started on Arava 10 mg 1 tablet by mouth daily (10 days ago).  She is tolerating arava without any side effects.  Discontinued Methotrexate-SE nausea. TB gold negative on 12/23/19.  Future order placed today.   CMP updated on 09/21/20.  CBC updated on 06/22/20.  She is due to update CBC and CMP today.  If lab work is stable she will increase to arava 20 mg by mouth daily.  According to the patient and last night she celebrated her birthday and had several glasses of wine and is concerned her LFTs may be elevated.  We discussed the importance of avoiding alcohol, NSAIDs, and Tylenol use while on Arava.- Plan: CBC with Differential/Platelet, QuantiFERON-TB Gold Plus, COMPLETE METABOLIC PANEL WITH GFR  Screening for tuberculosis -Future order for TB gold placed today.  Plan: QuantiFERON-TB Gold Plus  Numbness of right thumb: She has been experiencing pain, stiffness, and numbness in the right thumb for the past 2 weeks.  Her symptoms are likely secondary to the ongoing inflammation in her right wrist and 1st MCP joint.  She has been concerned about the diminished sensation in her right thumb.  She was evaluated in the ED on 09/25/20. X-rays did not reveal any acute findings.  She declined a NCV with EMG or neurology referral at this time.  She will continue on the current treatment  regimen.    Uveitis - Dr. Manuella Ghazi.  Recurrent flares in the past.  She will continue on Humira as  prescribed.   DDD (degenerative disc disease), lumbar - X-rays of her lumbar spine from August 26, 2019 were consistent with degenerative disc disease.  No syndesmophytes were noted.  No SI joint sclerosis was noted.   No midline spinal tenderness on exam.   HLA B 27 Positive   Olecranon bursitis of left elbow -  Aspirated and injected on March 21, 2020.  Synovial fluid negative for culture. She has thickening of the olecranon bursa but no tenderness, fluid, warmth, or erythema noted.   Primary osteoarthritis of both hands: She has PIP and DIP thickening consistent with osteoarthritis of both hands.   Primary osteoarthritis of both knees: She presents today with warmth and swelling in the right knee joint.  She has noticed some increased ease with mobility since starting on the prednisone taper.   Other medical conditions are listed as follows:   History of COPD  History of hypertension  Smoker  History of anxiety  Former smoker    Orders: Orders Placed This Encounter  Procedures  . CBC with Differential/Platelet  . QuantiFERON-TB Gold Plus  . COMPLETE METABOLIC PANEL WITH GFR   No orders of the defined types were placed in this encounter.     Follow-Up Instructions: Return in about 2 months (around 12/06/2020) for Rheumatoid arthritis, Uveitis, Osteoarthritis.   Ofilia Neas, PA-C  Note - This record has been created using Dragon software.  Chart creation errors have been sought, but may not always  have been located. Such creation errors do not reflect on  the standard of medical care.

## 2020-10-06 ENCOUNTER — Other Ambulatory Visit: Payer: Self-pay

## 2020-10-06 ENCOUNTER — Ambulatory Visit (INDEPENDENT_AMBULATORY_CARE_PROVIDER_SITE_OTHER): Payer: Medicare Other | Admitting: Physician Assistant

## 2020-10-06 ENCOUNTER — Encounter: Payer: Self-pay | Admitting: Physician Assistant

## 2020-10-06 VITALS — BP 157/85 | HR 103 | Ht 66.5 in | Wt 188.0 lb

## 2020-10-06 DIAGNOSIS — M0609 Rheumatoid arthritis without rheumatoid factor, multiple sites: Secondary | ICD-10-CM | POA: Diagnosis not present

## 2020-10-06 DIAGNOSIS — H209 Unspecified iridocyclitis: Secondary | ICD-10-CM

## 2020-10-06 DIAGNOSIS — Z111 Encounter for screening for respiratory tuberculosis: Secondary | ICD-10-CM

## 2020-10-06 DIAGNOSIS — M0630 Rheumatoid nodule, unspecified site: Secondary | ICD-10-CM | POA: Diagnosis not present

## 2020-10-06 DIAGNOSIS — M51369 Other intervertebral disc degeneration, lumbar region without mention of lumbar back pain or lower extremity pain: Secondary | ICD-10-CM

## 2020-10-06 DIAGNOSIS — M7022 Olecranon bursitis, left elbow: Secondary | ICD-10-CM

## 2020-10-06 DIAGNOSIS — R200 Anesthesia of skin: Secondary | ICD-10-CM

## 2020-10-06 DIAGNOSIS — M19042 Primary osteoarthritis, left hand: Secondary | ICD-10-CM

## 2020-10-06 DIAGNOSIS — Z8709 Personal history of other diseases of the respiratory system: Secondary | ICD-10-CM

## 2020-10-06 DIAGNOSIS — Z8659 Personal history of other mental and behavioral disorders: Secondary | ICD-10-CM

## 2020-10-06 DIAGNOSIS — M170 Bilateral primary osteoarthritis of knee: Secondary | ICD-10-CM

## 2020-10-06 DIAGNOSIS — Z79899 Other long term (current) drug therapy: Secondary | ICD-10-CM | POA: Diagnosis not present

## 2020-10-06 DIAGNOSIS — F17200 Nicotine dependence, unspecified, uncomplicated: Secondary | ICD-10-CM

## 2020-10-06 DIAGNOSIS — Z87891 Personal history of nicotine dependence: Secondary | ICD-10-CM

## 2020-10-06 DIAGNOSIS — M47819 Spondylosis without myelopathy or radiculopathy, site unspecified: Secondary | ICD-10-CM

## 2020-10-06 DIAGNOSIS — M5136 Other intervertebral disc degeneration, lumbar region: Secondary | ICD-10-CM

## 2020-10-06 DIAGNOSIS — Z8679 Personal history of other diseases of the circulatory system: Secondary | ICD-10-CM

## 2020-10-06 DIAGNOSIS — M19041 Primary osteoarthritis, right hand: Secondary | ICD-10-CM

## 2020-10-06 NOTE — Telephone Encounter (Signed)
Instructions per Hazel Sams, PA-C:   Pending lab results, please refill Arava 20mg  by mouth daily.   Patient is to continue on prednisone 10mg  daily for 1 month then reduce by 2.5mg  every week.  Patient is to d/c sulindac while taking prednisone.   Patient verbalized understanding. Please review and send prednisone prescription to the pharmacy.

## 2020-10-07 LAB — CBC WITH DIFFERENTIAL/PLATELET
Absolute Monocytes: 583 cells/uL (ref 200–950)
Basophils Absolute: 44 cells/uL (ref 0–200)
Basophils Relative: 0.5 %
Eosinophils Absolute: 17 cells/uL (ref 15–500)
Eosinophils Relative: 0.2 %
HCT: 46.3 % — ABNORMAL HIGH (ref 35.0–45.0)
Hemoglobin: 15.6 g/dL — ABNORMAL HIGH (ref 11.7–15.5)
Lymphs Abs: 1035 cells/uL (ref 850–3900)
MCH: 31.3 pg (ref 27.0–33.0)
MCHC: 33.7 g/dL (ref 32.0–36.0)
MCV: 93 fL (ref 80.0–100.0)
MPV: 9.8 fL (ref 7.5–12.5)
Monocytes Relative: 6.7 %
Neutro Abs: 7021 cells/uL (ref 1500–7800)
Neutrophils Relative %: 80.7 %
Platelets: 286 10*3/uL (ref 140–400)
RBC: 4.98 10*6/uL (ref 3.80–5.10)
RDW: 13.1 % (ref 11.0–15.0)
Total Lymphocyte: 11.9 %
WBC: 8.7 10*3/uL (ref 3.8–10.8)

## 2020-10-07 LAB — COMPLETE METABOLIC PANEL WITH GFR
AG Ratio: 1.4 (calc) (ref 1.0–2.5)
ALT: 16 U/L (ref 6–29)
AST: 16 U/L (ref 10–35)
Albumin: 4.5 g/dL (ref 3.6–5.1)
Alkaline phosphatase (APISO): 100 U/L (ref 37–153)
BUN/Creatinine Ratio: 31 (calc) — ABNORMAL HIGH (ref 6–22)
BUN: 26 mg/dL — ABNORMAL HIGH (ref 7–25)
CO2: 24 mmol/L (ref 20–32)
Calcium: 9.7 mg/dL (ref 8.6–10.4)
Chloride: 100 mmol/L (ref 98–110)
Creat: 0.83 mg/dL (ref 0.60–0.93)
GFR, Est African American: 81 mL/min/{1.73_m2} (ref >=60)
GFR, Est Non African American: 70 mL/min/{1.73_m2} (ref >=60)
Globulin: 3.3 g/dL (calc) (ref 1.9–3.7)
Glucose, Bld: 150 mg/dL — ABNORMAL HIGH (ref 65–99)
Potassium: 4.6 mmol/L (ref 3.5–5.3)
Sodium: 135 mmol/L (ref 135–146)
Total Bilirubin: 0.8 mg/dL (ref 0.2–1.2)
Total Protein: 7.8 g/dL (ref 6.1–8.1)

## 2020-10-08 ENCOUNTER — Other Ambulatory Visit: Payer: Self-pay | Admitting: Physician Assistant

## 2020-10-09 ENCOUNTER — Other Ambulatory Visit: Payer: Self-pay | Admitting: *Deleted

## 2020-10-09 DIAGNOSIS — M457 Ankylosing spondylitis of lumbosacral region: Secondary | ICD-10-CM

## 2020-10-09 MED ORDER — PREDNISONE 10 MG PO TABS: 10.0000 mg | ORAL_TABLET | Freq: Every day | ORAL | 0 refills | Status: DC

## 2020-10-09 MED ORDER — HUMIRA (2 PEN) 40 MG/0.4ML ~~LOC~~ AJKT: 40.0000 mg | AUTO-INJECTOR | SUBCUTANEOUS | 0 refills | Status: DC

## 2020-10-09 MED ORDER — LEFLUNOMIDE 20 MG PO TABS: 20.0000 mg | ORAL_TABLET | Freq: Every day | ORAL | 0 refills | Status: DC

## 2020-10-09 NOTE — Progress Notes (Signed)
Hemoglobin and hematocrit are borderline elevated. Rest of CBC WNL. Glucose is 150.  LFTs WNL.  We will continue to monitor lab work.  Please advise the patient to return for lab work in 2 weeks after increasing the dose of Arava to 20 mg daily.

## 2020-10-09 NOTE — Telephone Encounter (Signed)
Patient called pharmacy in reference to Humira. Pharmacy requesting for a refill  to be sent in.  Please send into [facility]. Patient is completely out.

## 2020-10-09 NOTE — Telephone Encounter (Signed)
Next Visit: 11/21/2020  Last Visit: 10/06/2020  Last Fill: 06/22/2020, Humira, 09/21/2020 # 14  DX:  Rheumatoid arthritis of multiple sites with negative rheumatoid factor   Current Dose per office note  10/06/2020, Humira 40 mg sq injections every 14 days,  increasing the dose of Arava to 20 mg daily.   Labs: 10/06/2020, Hemoglobin and hematocrit are borderline elevated. Rest of CBC WNL. Glucose is 150. LFTs WNL. We will continue to monitor lab work. Please advise the patient to return for lab work in 2 weeks after increasing the dose of Arava to 20 mg daily.   TB Gold: 12/23/2019, negative  Okay to refill Humira and Arava?

## 2020-10-13 NOTE — Progress Notes (Signed)
She can take tylenol as needed but she should avoid taking it on a daily basis since she was recently started on arava.

## 2020-11-05 ENCOUNTER — Other Ambulatory Visit: Payer: Self-pay | Admitting: Physician Assistant

## 2020-11-05 NOTE — Telephone Encounter (Signed)
Next Visit: 11/21/2020  Last Visit: 10/06/2020  Last Fill: 08/03/2020  DX: Rheumatoid arthritis of multiple sites with negative rheumatoid factor   Current Dose per office note 10/06/2020, not mentioned  Labs: 10/06/2020, Hemoglobin and hematocrit are borderline elevated. Rest of CBC WNL. Glucose is 150. LFTs WNL. We will continue to monitor lab work. Please advise the patient to return for lab work in 2 weeks after increasing the dose of Arava to 20 mg daily.   Okay to refill Sulindac?

## 2020-11-06 ENCOUNTER — Other Ambulatory Visit: Payer: Self-pay | Admitting: Physician Assistant

## 2020-11-06 ENCOUNTER — Other Ambulatory Visit: Payer: Self-pay | Admitting: *Deleted

## 2020-11-06 DIAGNOSIS — Z79899 Other long term (current) drug therapy: Secondary | ICD-10-CM

## 2020-11-06 NOTE — Telephone Encounter (Signed)
Next Visit: 11/21/2020  Last Visit: 10/06/2020  Last Fill: 10/09/2020  Dx: Rheumatoid arthritis of multiple sites with negative rheumatoid factor   Current Dose per office note on 10/06/2020,  prednisone 10 mg daily for 1 month and then she will taper by 2.5 mg every 2 weeks  Okay to refill prednisone?

## 2020-11-07 ENCOUNTER — Telehealth: Payer: Self-pay

## 2020-11-07 NOTE — Progress Notes (Signed)
CBC and CMP are stable.

## 2020-11-07 NOTE — Telephone Encounter (Signed)
Pt left v/m she does not know who she will be seeing at her 1 PM appt today. I do not see a 1PM appt today for pt. I called pt and got v/m and per DPR I left v/m for pt to cb to [facility] to talk about appt. Sending note to [place] front office mgr.

## 2020-11-08 LAB — CBC WITH DIFFERENTIAL/PLATELET
Absolute Monocytes: 701 cells/uL (ref 200–950)
Basophils Absolute: 37 cells/uL (ref 0–200)
Basophils Relative: 0.5 %
Eosinophils Absolute: 161 cells/uL (ref 15–500)
Eosinophils Relative: 2.2 %
HCT: 45.2 % — ABNORMAL HIGH (ref 35.0–45.0)
Hemoglobin: 15.3 g/dL (ref 11.7–15.5)
Lymphs Abs: 1219 cells/uL (ref 850–3900)
MCH: 31.7 pg (ref 27.0–33.0)
MCHC: 33.8 g/dL (ref 32.0–36.0)
MCV: 93.6 fL (ref 80.0–100.0)
MPV: 10.1 fL (ref 7.5–12.5)
Monocytes Relative: 9.6 %
Neutro Abs: 5183 cells/uL (ref 1500–7800)
Neutrophils Relative %: 71 %
Platelets: 238 10*3/uL (ref 140–400)
RBC: 4.83 10*6/uL (ref 3.80–5.10)
RDW: 13.4 % (ref 11.0–15.0)
Total Lymphocyte: 16.7 %
WBC: 7.3 10*3/uL (ref 3.8–10.8)

## 2020-11-08 LAB — QUANTIFERON-TB GOLD PLUS
Mitogen-NIL: >10 IU/mL
NIL: 0.02 IU/mL
QuantiFERON-TB Gold Plus: NEGATIVE
TB1-NIL: 0 IU/mL
TB2-NIL: 0 IU/mL

## 2020-11-08 LAB — COMPLETE METABOLIC PANEL WITH GFR
AG Ratio: 1.6 (calc) (ref 1.0–2.5)
ALT: 19 U/L (ref 6–29)
AST: 15 U/L (ref 10–35)
Albumin: 4.3 g/dL (ref 3.6–5.1)
Alkaline phosphatase (APISO): 71 U/L (ref 37–153)
BUN: 12 mg/dL (ref 7–25)
CO2: 26 mmol/L (ref 20–32)
Calcium: 9.8 mg/dL (ref 8.6–10.4)
Chloride: 104 mmol/L (ref 98–110)
Creat: 0.75 mg/dL (ref 0.60–0.93)
GFR, Est African American: 92 mL/min/{1.73_m2} (ref >=60)
GFR, Est Non African American: 79 mL/min/{1.73_m2} (ref >=60)
Globulin: 2.7 g/dL (calc) (ref 1.9–3.7)
Glucose, Bld: 108 mg/dL — ABNORMAL HIGH (ref 65–99)
Potassium: 4.1 mmol/L (ref 3.5–5.3)
Sodium: 138 mmol/L (ref 135–146)
Total Bilirubin: 1.4 mg/dL — ABNORMAL HIGH (ref 0.2–1.2)
Total Protein: 7 g/dL (ref 6.1–8.1)

## 2020-11-08 NOTE — Progress Notes (Signed)
Office Visit Note  Patient: Kirsten Harper             Date of Birth: [birth year]           MRN: [id number]             PCP: Venia Carbon, MD Referring: Venia Carbon, MD Visit Date: 11/09/2020 Occupation: @GUAROCC @  Subjective:  Other (Patient reports numbness in right hand )   History of Present Illness: Kirsten Harper is a 75 y.o. female with history of seropositive rheumatoid arthritis, rheumatoid nodulosis, osteoarthritis and uveitis.  She states she has been followed by Dr. Manuella Ghazi and no new recommendations were given.  He felt she has symptoms of dry eyes.  She is on leflunomide 20 mg p.o. daily now.  She has noticed loose stool recently she is uncertain if is related to leflunomide..  She been taking Humira on a regular basis.  She denies any joint pain or swelling today.  Been under a lot of stress as she has been taking care of her mother and also her brother is terminal.  Activities of Daily Living:  Patient reports morning stiffness for 0  minutes.   Patient Denies nocturnal pain.  Difficulty dressing/grooming: Denies Difficulty climbing stairs: Denies Difficulty getting out of chair: Denies Difficulty using hands for taps, buttons, cutlery, and/or writing: Reports  Review of Systems  Constitutional: Negative for fatigue.  HENT: Negative for mouth sores, mouth dryness and nose dryness.   Eyes: Positive for photophobia and dryness. Negative for pain and itching.  Respiratory: Negative for shortness of breath and difficulty breathing.   Cardiovascular: Negative for chest pain and palpitations.  Gastrointestinal: Negative for blood in stool, constipation and diarrhea.  Endocrine: Negative for increased urination.  Genitourinary: Negative for difficulty urinating.  Musculoskeletal: Positive for joint swelling. Negative for arthralgias, joint pain, myalgias, morning stiffness, muscle tenderness and myalgias.  Skin: Negative for color change, rash and redness.   Allergic/Immunologic: Negative for susceptible to infections.  Neurological: Positive for numbness. Negative for dizziness, headaches and memory loss.  Hematological: Negative for bruising/bleeding tendency.  Psychiatric/Behavioral: Negative for confusion. The patient is nervous/anxious.     PMFS History:  Patient Active Problem List   Diagnosis Date Noted  . Colitis 07/28/2018  . Preventative health care 05/04/2018  . Advance directive discussed with patient 05/04/2018  . RLS (restless legs syndrome) 04/25/2017  . Mood disorder ([place]) 04/08/2017  . Sensory loss 04/08/2017  . COPD (chronic obstructive pulmonary disease) ([place]) 04/08/2017  . Ankylosing spondylitis of lumbosacral region ([place]) 09/24/2016  . GAD (generalized anxiety disorder) 08/27/2016  . 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 Spondyloarthropathy HLA-B27 positive 06/06/2016  . Iritis 06/06/2016  . High risk medication use 06/06/2016  . Primary osteoarthritis of both hands 06/06/2016  . Smoker 06/06/2016  . COPD exacerbation ([place]) 04/10/2016  . Anxiety 04/10/2016  . Essential hypertension     Past Medical History:  Diagnosis Date  . Ankylosing spondylitis ([place])   . Anxiety   . Arthritis   . Chronic lower back pain   . CKD (chronic kidney disease), stage II    "related to the Ankylosing spondylitis" (04/10/2016)  . COPD (chronic obstructive pulmonary disease) ([place])   . Eye disease   . Hypertension   . PONV (postoperative nausea and vomiting)    "didn't bother me the last time I had it in ~ 2015"    Family History  Problem Relation Age of Onset  .  Hypertension Mother   . Heart disease Sister   . Diabetes Sister   . Pulmonary fibrosis Brother   . Idiopathic pulmonary fibrosis Brother   . Alzheimer's disease Brother   . Dementia Brother   . Frontotemporal dementia Brother    Past Surgical History:  Procedure Laterality Date  . BROW LIFT AND BLEPHAROPLASTY Bilateral ~ 2015  .  CATARACT EXTRACTION W/ INTRAOCULAR LENS  IMPLANT, BILATERAL Bilateral 2000s  . CYST REMOVAL TRUNK  12/2018  . MANDIBLE SURGERY  ~ 2000   "jaw had moved to left; broke my jaw in 5 places; put titanium in; stripped muscle from bone; wired jaw shut"  . TUBAL LIGATION  1979  . VAGINAL HYSTERECTOMY  1985   Social History   Social History Narrative   2 sons--not in area   Lives on property with her mother [name]      Has living will   Would want one of her sons, [name], to be Media planner   Requests DNR   No tube feeds   Immunization History  Administered Date(s) Administered  . Influenza Inj Mdck Quad Pf 04/16/2018  . Influenza, High Dose Seasonal PF 04/09/2019, 02/22/2020  . Influenza-Unspecified 04/09/2019  . Moderna Sars-Covid-2 Vaccination 09/06/2019, 10/05/2019, 03/19/2020  . Pneumococcal Conjugate-13 05/10/2013  . Pneumococcal Polysaccharide-23 09/20/2014  . Tdap 04/30/2011  . Zoster Recombinat (Shingrix) 10/01/2016     Objective: Vital Signs: BP 131/82 (BP Location: Left Arm, Patient Position: Sitting, Cuff Size: Normal)   Pulse 83   Resp 16   Ht $R'5\' 6"'YM$  (1.676 m)   Wt 179 lb (81.2 kg)   BMI 28.89 kg/m    Physical Exam Vitals and nursing note reviewed.  Constitutional:      Appearance: She is well-developed.  HENT:     Head: Normocephalic and atraumatic.  Eyes:     Conjunctiva/sclera: Conjunctivae normal.  Cardiovascular:     Rate and Rhythm: Normal rate and regular rhythm.     Heart sounds: Normal heart sounds.  Pulmonary:     Effort: Pulmonary effort is normal.     Breath sounds: Normal breath sounds.  Abdominal:     General: Bowel sounds are normal.     Palpations: Abdomen is soft.  Musculoskeletal:     Cervical back: Normal range of motion.  Lymphadenopathy:     Cervical: No cervical adenopathy.  Skin:    General: Skin is warm and dry.     Capillary Refill: Capillary refill takes less than 2 seconds.  Neurological:     Mental Status: She is  alert and oriented to person, place, and time.  Psychiatric:        Behavior: Behavior normal.      Musculoskeletal Exam: C-spine was in good range of motion.  Shoulder joints, elbow joints, wrist joints with good range of motion.  She had some warmth and palpation of her right wrist joint.  Synovitis of her right second and third MCPs was noted.  Hip joints and knee joints in good range of motion.  She had no tenderness over MTPs or PIPs.  CDAI Exam: CDAI Score: 6.6  Patient Global: 2 mm; Provider Global: 4 mm Swollen: 3 ; Tender: 3  Joint Exam 11/09/2020      Right  Left  Wrist  Swollen Tender     MCP 2  Swollen Tender     MCP 3  Swollen Tender        Investigation: No additional findings.  Imaging: No results  found.  Recent Labs: Lab Results  Component Value Date   WBC 7.3 11/06/2020   HGB 15.3 11/06/2020   PLT 238 11/06/2020   NA 138 11/06/2020   K 4.1 11/06/2020   CL 104 11/06/2020   CO2 26 11/06/2020   GLUCOSE 108 (H) 11/06/2020   BUN 12 11/06/2020   CREATININE 0.75 11/06/2020   BILITOT 1.4 (H) 11/06/2020   ALKPHOS 98 07/27/2018   AST 15 11/06/2020   ALT 19 11/06/2020   PROT 7.0 11/06/2020   ALBUMIN 4.3 08/03/2018   CALCIUM 9.8 11/06/2020   GFRAA 92 11/06/2020   QFTBGOLDPLUS NEGATIVE 11/06/2020    Speciality Comments: Methotrexate-nausea, Areva started March 21, 2020 Humira January 22, 2017  Procedures:  No procedures performed Allergies: Amoxapine and related, Seroquel [quetiapine], and Other   Assessment / Plan:     Visit Diagnoses: Rheumatoid arthritis of multiple sites with negative rheumatoid factor ([place]) - Erosive changes, RF-, anti-CCP-, 14-3-3 eta-: Patient states she is doing much better since she is on leflunomide 20 mg p.o. daily along with Humira.  She is also currently on prednisone 10 mg p.o. daily.  She had mild synovitis in her right second and third MCP joints and her right wrist joint.  She also gives history of looser stools which  she is not sure if related to leflunomide or if she had symptoms even prior to that.  I detailed discussion regarding different treatment options.  We discussed switching to Enbrel or Orencia.  I also discussed switching back from leflunomide to methotrexate.  She can probably tolerate subcutaneous methotrexate.  She does not want to change her treatment plan.  I discussed tapering prednisone by 2.5 mg every week.  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  High risk medication use - Humira 40 mg sq injections every 14 days, Arava 10 mg 1 tablet by mouth daily, prednisone $RemoveBeforeDEI'10mg'oDgZykzXjanCDmjP$  by mouth daily.  Labs obtained on April 25 are within normal limits.  She will get labs every 3 months.  Uveitis - Dr. Manuella Ghazi.  Recurrent flares in the past.  States she has not had a flare of uveitis although she does have dry eyes.  Numbness of right thumb-related to osteoarthritis.  Primary osteoarthritis of both hands-she has underlying osteoarthritis in her hands.  Olecranon bursitis of left elbow - Aspirated and injected on March 21, 2020.  Synovial fluid negative for culture.  She had no olecranon bursitis but she had bursal sac.  Primary osteoarthritis of both knees-she denies any discomfort today.  DDD (degenerative disc disease), lumbar - X-rays of her lumbar spine from August 26, 2019 were consistent with degenerative disc disease.  She has chronic lower back pain.  HLA B 27 Positive   History of hypertension-blood pressure is normal today.  History of COPD  History of anxiety-she has been under a lot of stress that she is taking care of her mother and also her brother is terminal.  Former smoker  Orders: No orders of the defined types were placed in this encounter.  No orders of the defined types were placed in this encounter.    Follow-Up Instructions: Return in about 3 months (around 02/08/2021) for Rheumatoid arthritis.   Bo Merino, MD  Note - This record  has been created using Editor, commissioning.  Chart creation errors have been sought, but may not always  have been located. Such creation errors do not reflect on  the standard of medical care.

## 2020-11-09 ENCOUNTER — Encounter: Payer: Self-pay | Admitting: Rheumatology

## 2020-11-09 ENCOUNTER — Ambulatory Visit (INDEPENDENT_AMBULATORY_CARE_PROVIDER_SITE_OTHER): Payer: Medicare Other | Admitting: Rheumatology

## 2020-11-09 ENCOUNTER — Other Ambulatory Visit: Payer: Self-pay

## 2020-11-09 VITALS — BP 131/82 | HR 83 | Resp 16 | Ht 66.0 in | Wt 179.0 lb

## 2020-11-09 DIAGNOSIS — H209 Unspecified iridocyclitis: Secondary | ICD-10-CM | POA: Diagnosis not present

## 2020-11-09 DIAGNOSIS — M0609 Rheumatoid arthritis without rheumatoid factor, multiple sites: Secondary | ICD-10-CM | POA: Diagnosis not present

## 2020-11-09 DIAGNOSIS — Z87891 Personal history of nicotine dependence: Secondary | ICD-10-CM

## 2020-11-09 DIAGNOSIS — M51369 Other intervertebral disc degeneration, lumbar region without mention of lumbar back pain or lower extremity pain: Secondary | ICD-10-CM

## 2020-11-09 DIAGNOSIS — M47819 Spondylosis without myelopathy or radiculopathy, site unspecified: Secondary | ICD-10-CM

## 2020-11-09 DIAGNOSIS — Z79899 Other long term (current) drug therapy: Secondary | ICD-10-CM | POA: Diagnosis not present

## 2020-11-09 DIAGNOSIS — Z8659 Personal history of other mental and behavioral disorders: Secondary | ICD-10-CM

## 2020-11-09 DIAGNOSIS — Z8679 Personal history of other diseases of the circulatory system: Secondary | ICD-10-CM

## 2020-11-09 DIAGNOSIS — M19042 Primary osteoarthritis, left hand: Secondary | ICD-10-CM

## 2020-11-09 DIAGNOSIS — M7022 Olecranon bursitis, left elbow: Secondary | ICD-10-CM

## 2020-11-09 DIAGNOSIS — M0630 Rheumatoid nodule, unspecified site: Secondary | ICD-10-CM | POA: Diagnosis not present

## 2020-11-09 DIAGNOSIS — R200 Anesthesia of skin: Secondary | ICD-10-CM

## 2020-11-09 DIAGNOSIS — M19041 Primary osteoarthritis, right hand: Secondary | ICD-10-CM

## 2020-11-09 DIAGNOSIS — M170 Bilateral primary osteoarthritis of knee: Secondary | ICD-10-CM

## 2020-11-09 DIAGNOSIS — M5136 Other intervertebral disc degeneration, lumbar region: Secondary | ICD-10-CM

## 2020-11-09 DIAGNOSIS — Z8709 Personal history of other diseases of the respiratory system: Secondary | ICD-10-CM

## 2020-11-09 MED ORDER — PREDNISONE 5 MG PO TABS: ORAL_TABLET | ORAL | 0 refills | Status: DC

## 2020-11-09 NOTE — Patient Instructions (Addendum)
Standing Labs We placed an order today for your standing lab work.   Please have your standing labs drawn in July and every 3 months  If possible, please have your labs drawn 2 weeks prior to your appointment so that the provider can discuss your results at your appointment.  We have open lab daily Monday through Thursday from 1:30-4:30 PM and Friday from 1:30-4:00 PM at the office of Dr. Bo Merino, [facility].   Please be advised, all patients with office appointments requiring lab work will take precedents over walk-in lab work.  If possible, please come for your lab work on Monday and Friday afternoons, as you may experience shorter wait times. The office is located at [address] [place], [place], [place] [address] No appointment is necessary.   Labs are drawn by [facility]. Please bring your co-pay at the time of your lab draw.  You may receive a bill from [facility] for your lab work.  If you wish to have your labs drawn at another location, please call the office 24 hours in advance to send orders.  If you have any questions regarding directions or hours of operation,  please call [phone].   As a reminder, please drink plenty of water prior to coming for your lab work. Thanks!   Please take prednisone 5 mg tablets, 2 tablets daily for 1 week, 1-1/2 tablet daily for 1 week, 1 tablet daily for 1 week, half tablet daily for 1 week.

## 2020-11-10 ENCOUNTER — Other Ambulatory Visit: Payer: Self-pay

## 2020-11-10 ENCOUNTER — Ambulatory Visit (INDEPENDENT_AMBULATORY_CARE_PROVIDER_SITE_OTHER): Payer: Medicare Other | Admitting: Internal Medicine

## 2020-11-10 ENCOUNTER — Encounter: Payer: Self-pay | Admitting: Internal Medicine

## 2020-11-10 DIAGNOSIS — I10 Essential (primary) hypertension: Secondary | ICD-10-CM | POA: Diagnosis not present

## 2020-11-10 DIAGNOSIS — F39 Unspecified mood [affective] disorder: Secondary | ICD-10-CM

## 2020-11-10 NOTE — Patient Instructions (Signed)
Please continue just one amlodipine and losartan daily. It would be okay to split them up--one in the morning and one at night, if you will remember them.  If your nerves and stress don't calm down in the next couple of weeks, we should consider increasing the lexapro (escitalopram).

## 2020-11-10 NOTE — Assessment & Plan Note (Signed)
BP Readings from Last 3 Encounters:  11/10/20 122/74  11/09/20 131/82  10/06/20 (!) 157/85   Elevated BP seems to be a secondary thing Recommended sticking with amlodipine and losartan at current doses---one AM and one PM

## 2020-11-10 NOTE — Progress Notes (Signed)
Subjective:    Patient ID: Kirsten Harper, female    DOB: 1946/01/28, 75 y.o.   MRN: [id number]  HPI Here for follow up of stress and anxiety and HTN This visit occurred during the SARS-CoV-2 public health emergency.  Safety protocols were in place, including screening questions prior to the visit, additional usage of staff PPE, and extensive cleaning of exam room while observing appropriate contact time as indicated for disinfecting solutions.   "You won't believe what is going on in my life" Mom's dementia getting worse, brother is dying and his wife is trying to kick him out, etc Mom and sister are meeting this afternoon about selling property (while maintaining right to live there) Does use the xanax at times  Stress with arthritis On prednisone taper now--this helps  She doubled her losartan and amlodipine Worried that her BP was too high and her anxiety "I thought I was going to have a stroke"  Current Outpatient Medications on File Prior to Visit  Medication Sig Dispense Refill  . Adalimumab (HUMIRA PEN) 40 MG/0.4ML PNKT Inject 40 mg into the skin every 14 (fourteen) days. 3 each 0  . ALPRAZolam (XANAX) 0.5 MG tablet Take 0.5 mg by mouth 2 (two) times daily.    Marland Kitchen amLODipine (NORVASC) 5 MG tablet Take 1 tablet (5 mg total) by mouth daily. (Patient taking differently: Take 5 mg by mouth daily. Pt states she has been taking 2 a day) 90 tablet 3  . dorzolamide-timolol (COSOPT) 22.3-6.8 MG/ML ophthalmic solution Place 1 drop into both eyes 2 (two) times daily.     Marland Kitchen escitalopram (LEXAPRO) 10 MG tablet Take 10 mg by mouth daily.    . fluticasone (FLONASE) 50 MCG/ACT nasal spray Place into both nostrils.    Marland Kitchen leflunomide (ARAVA) 20 MG tablet Take 1 tablet (20 mg total) by mouth daily. 90 tablet 0  . losartan (COZAAR) 100 MG tablet TAKE 1 TABLET BY MOUTH EVERY DAY (Patient taking differently: Pt states she is taking 2 a day) 90 tablet 3  . LOTEMAX 0.5 % ophthalmic suspension Place 1  drop into both eyes 2 (two) times daily.     . predniSONE (DELTASONE) 5 MG tablet Take 2 tablets by mouth daily for 1 week, then take 1.5 tabs by mouth daily for 1 week, then take 1 tab by mouth daily for 1 week, then 1/2 tab by mouth daily for 1 week. 35 tablet 0  . rOPINIRole (REQUIP) 1 MG tablet TAKE 3 TABLETS (3 MG TOTAL) BY MOUTH AT BEDTIME. 270 tablet 3   No current facility-administered medications on file prior to visit.    Allergies  Allergen Reactions  . Amoxapine And Related Other (See Comments)    (Asendin- "Tricyclic antidepressant") Patient reports it did not work for her  . Seroquel [Quetiapine] Other (See Comments)    Worsened restless legs  . Other Palpitations and Other (See Comments)    Psych med (name not recalled by the patient, but it "starts with a Z")- Worsened restless legs, also (NOT Zyprexa; I asked)    Past Medical History:  Diagnosis Date  . Ankylosing spondylitis ([place])   . Anxiety   . Arthritis   . Chronic lower back pain   . CKD (chronic kidney disease), stage II    "related to the Ankylosing spondylitis" (04/10/2016)  . COPD (chronic obstructive pulmonary disease) ([place])   . Eye disease   . Hypertension   . PONV (postoperative nausea and vomiting)    "  didn't bother me the last time I had it in ~ 2015"    Past Surgical History:  Procedure Laterality Date  . BROW LIFT AND BLEPHAROPLASTY Bilateral ~ 2015  . CATARACT EXTRACTION W/ INTRAOCULAR LENS  IMPLANT, BILATERAL Bilateral 2000s  . CYST REMOVAL TRUNK  12/2018  . MANDIBLE SURGERY  ~ 2000   "jaw had moved to left; broke my jaw in 5 places; put titanium in; stripped muscle from bone; wired jaw shut"  . TUBAL LIGATION  1979  . VAGINAL HYSTERECTOMY  1985    Family History  Problem Relation Age of Onset  . Hypertension Mother   . Heart disease Sister   . Diabetes Sister   . Pulmonary fibrosis Brother   . Idiopathic pulmonary fibrosis Brother   . Alzheimer's disease Brother   . Dementia  Brother   . Frontotemporal dementia Brother     Social History   Socioeconomic History  . Marital status: Divorced    Spouse name: Not on file  . Number of children: 2  . Years of education: Not on file  . Highest education level: Not on file  Occupational History  . Occupation: Sales promotion account executive for [employer]    Comment: Retired  Tobacco Use  . Smoking status: Current Every Day Smoker    Packs/day: 1.00    Years: 51.00    Pack years: 51.00    Types: Cigarettes  . Smokeless tobacco: Never Used  Vaping Use  . Vaping Use: Never used  Substance and Sexual Activity  . Alcohol use: Yes    Alcohol/week: 0.0 standard drinks    Comment: occ  . Drug use: No  . Sexual activity: Never  Other Topics Concern  . Not on file  Social History Narrative   2 sons--not in area   Lives on property with her mother [name]      Has living will   Would want one of her sons, [name], to be Media planner   Requests DNR   No tube feeds   Social Determinants of Health   Financial Resource Strain: Not on file  Food Insecurity: Not on file  Transportation Needs: Not on file  Physical Activity: Not on file  Stress: Not on file  Social Connections: Not on file  Intimate Partner Violence: Not on file   Review of Systems  Some diarrhea Not sleeping well right now    Objective:   Physical Exam Constitutional:      Appearance: Normal appearance.  Neurological:     Mental Status: She is alert.  Psychiatric:     Comments: Anxious almost manic Had to work hard to get her to specify what is going on            Assessment & Plan:

## 2020-11-10 NOTE — Assessment & Plan Note (Signed)
Has had a lot of stress come together right now Somewhat manic and stressed May have some things clear up soon  Will consider increasing the escitalopram in the next couple of weeks if not calming down

## 2020-11-21 ENCOUNTER — Ambulatory Visit: Payer: Medicare Other | Admitting: Physician Assistant

## 2020-12-12 ENCOUNTER — Telehealth: Payer: Self-pay | Admitting: Internal Medicine

## 2020-12-12 NOTE — Telephone Encounter (Signed)
error 

## 2020-12-18 ENCOUNTER — Other Ambulatory Visit: Payer: Self-pay | Admitting: Internal Medicine

## 2020-12-28 ENCOUNTER — Other Ambulatory Visit: Payer: Self-pay | Admitting: *Deleted

## 2020-12-28 DIAGNOSIS — M457 Ankylosing spondylitis of lumbosacral region: Secondary | ICD-10-CM

## 2020-12-28 MED ORDER — HUMIRA (2 PEN) 40 MG/0.4ML ~~LOC~~ AJKT: 40.0000 mg | AUTO-INJECTOR | SUBCUTANEOUS | 0 refills | Status: DC

## 2020-12-28 NOTE — Telephone Encounter (Signed)
Refill request received via fax  Next Visit: 02/08/2021  Last Visit: 11/09/2020  Last Fill: 10/09/2020  DX:: Rheumatoid arthritis of multiple sites with negative rheumatoid factor  Current Dose per office note 11/09/2020: Humira 40 mg sq injections every 14 days  Labs: 11/06/2020 CBC and CMP are stable.  TB Gold: 11/06/2020 Neg    Okay to refill Humira?

## 2020-12-29 ENCOUNTER — Other Ambulatory Visit: Payer: Self-pay | Admitting: Internal Medicine

## 2021-01-05 ENCOUNTER — Other Ambulatory Visit: Payer: Self-pay | Admitting: Physician Assistant

## 2021-01-05 NOTE — Telephone Encounter (Signed)
Next Visit: 02/08/2021   Last Visit: 11/09/2020   Last Fill: 10/09/2020  DX:: Rheumatoid arthritis of multiple sites with negative rheumatoid factor   Current Dose per office note 11/09/2020:   Labs: 11/06/2020 CBC and CMP are stable.  Okay to refill Arava?

## 2021-01-25 NOTE — Progress Notes (Deleted)
Office Visit Note  Patient: Kirsten Harper             Date of Birth: [birth year]           MRN: [id number]             PCP: Venia Carbon, MD Referring: Venia Carbon, MD Visit Date: 02/08/2021 Occupation: _0 @  Subjective:    History of Present Illness: Kirsten Harper is a 75 y.o. female with history of seronegative rheumatoid arthritis, uveitis, and osteoarthritis.  She is on Humira 40 mg sq injections every 14 days and arava 10 mg daily.  Prednisone?   CBC and CMP updated on 11/06/20.  She is due to update lab work.  Orders released.  Her next lab work will be due in October and every 3 months.  Standing orders for CBC and CMP are in place.  TB gold negative on 11/06/20.   Activities of Daily Living:  Patient reports morning stiffness for *** {minute/hour:19697}.   Patient {ACTIONS;DENIES/REPORTS:21021675::"Denies"} nocturnal pain.  Difficulty dressing/grooming: {ACTIONS;DENIES/REPORTS:21021675::"Denies"} Difficulty climbing stairs: {ACTIONS;DENIES/REPORTS:21021675::"Denies"} Difficulty getting out of chair: {ACTIONS;DENIES/REPORTS:21021675::"Denies"} Difficulty using hands for taps, buttons, cutlery, and/or writing: {ACTIONS;DENIES/REPORTS:21021675::"Denies"}  No Rheumatology ROS completed.   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) 04/10/2016   Anxiety 04/10/2016   Essential hypertension     Past Medical History:  Diagnosis Date   Ankylosing spondylitis (HCC)    Anxiety    Arthritis    Chronic lower back pain    CKD (chronic kidney disease), stage II    "related to the Ankylosing spondylitis" (04/10/2016)   COPD (chronic obstructive pulmonary disease) (HCC)    Eye disease    Hypertension    PONV (postoperative nausea and vomiting)    "didn't bother me the last time I had it in ~ 2015"    Family History  Problem Relation Age of Onset   Hypertension Mother    Heart disease Sister    Diabetes Sister    Pulmonary fibrosis Brother    Idiopathic pulmonary fibrosis Brother    Alzheimer's disease Brother    Dementia Brother    Frontotemporal dementia Brother    Past Surgical History:  Procedure Laterality Date   BROW LIFT AND BLEPHAROPLASTY Bilateral ~ 2015   CATARACT EXTRACTION W/ INTRAOCULAR LENS  IMPLANT, BILATERAL Bilateral 2000s   CYST REMOVAL TRUNK  12/2018   MANDIBLE SURGERY  ~ 2000   "jaw had moved to left; broke my jaw in 5 places; put titanium in; stripped muscle from bone; wired jaw shut"   [place]   Social History   Social History Narrative   2 sons--not in area   Lives on property with her mother [name]      Has living will   Would want one of her sons,  John, to be decision maker   Requests DNR   No tube feeds   Immunization History  Administered Date(s) Administered   Influenza Inj Mdck Quad Pf 04/16/2018   Influenza, High Dose Seasonal PF 04/09/2019, 02/22/2020   Influenza-Unspecified 04/09/2019   Moderna Sars-Covid-2 Vaccination 09/06/2019, 10/05/2019, 03/19/2020   Pneumococcal Conjugate-13 05/10/2013   Pneumococcal Polysaccharide-23 09/20/2014   Tdap 04/30/2011   Zoster Recombinat (Shingrix) 10/01/2016     Objective: Vital Signs: There were no vitals taken for this visit.    Physical Exam Vitals and nursing note reviewed.  Constitutional:      Appearance: She is well-developed.  HENT:     Head: Normocephalic and atraumatic.  Eyes:     Conjunctiva/sclera: Conjunctivae normal.  Pulmonary:     Effort: Pulmonary effort is normal.  Abdominal:     Palpations: Abdomen is soft.  Musculoskeletal:     Cervical back: Normal range of motion.  Skin:    General: Skin is warm and dry.     Capillary Refill: Capillary refill takes less than 2 seconds.  Neurological:     Mental Status: She is alert and oriented to person, place, and time.  Psychiatric:        Behavior: Behavior normal.     Musculoskeletal Exam: ***  CDAI Exam: CDAI Score: -- Patient Global: --; Provider Global: -- Swollen: --; Tender: -- Joint Exam 02/08/2021   No joint exam has been documented for this visit   There is currently no information documented on the homunculus. Go to the Rheumatology activity and complete the homunculus joint exam.  Investigation: No additional findings.  Imaging: No results found.  Recent Labs: Lab Results  Component Value Date   WBC 7.3 11/06/2020   HGB 15.3 11/06/2020   PLT 238 11/06/2020   NA 138 11/06/2020   K 4.1 11/06/2020   CL 104 11/06/2020   CO2 26 11/06/2020   GLUCOSE 108 (H) 11/06/2020   BUN 12 11/06/2020   CREATININE 0.75 11/06/2020   BILITOT 1.4 (H) 11/06/2020   ALKPHOS 98 07/27/2018   AST 15 11/06/2020   ALT 19 11/06/2020   PROT 7.0 11/06/2020   ALBUMIN 4.3 08/03/2018   CALCIUM 9.8 11/06/2020   GFRAA 92 11/06/2020   QFTBGOLDPLUS NEGATIVE 11/06/2020    Speciality Comments: Methotrexate-nausea, Areva started March 21, 2020 Humira January 22, 2017  Procedures:  No procedures performed Allergies: Amoxapine and related, Seroquel [quetiapine], and Other   Assessment / Plan:     Visit Diagnoses: Rheumatoid arthritis of multiple sites with negative rheumatoid factor ([place]) - Erosive changes, RF-, anti-CCP-, 14-3-3 eta-:    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  High risk medication use - Humira 40 mg sq injections every 14 days, Arava 10 mg 1 tablet by mouth daily, prednisone 5m by mouth daily.   Uveitis - Dr. SManuella Ghazi  Recurrent flares in the past.    Numbness of right thumb  Primary osteoarthritis of both hands  Olecranon bursitis of left elbow - Aspirated and injected on March 21, 2020.  Synovial fluid negative for culture.   Primary osteoarthritis of both knees  DDD (degenerative disc disease), lumbar - X-rays of her lumbar spine from August 26, 2019 were consistent with degenerative disc disease.   HLA B 27 Positive   History of hypertension  History of anxiety  History of COPD  Former smoker  Orders: No orders of the defined types were placed in this encounter.  No orders of the defined  types were placed in this encounter.     Follow-Up Instructions: No follow-ups on file.   Ofilia Neas, PA-C  Note - This record has been created using Dragon software.  Chart creation errors have been sought, but may not always  have been located. Such creation errors do not reflect on  the standard of medical care.

## 2021-02-08 ENCOUNTER — Encounter (HOSPITAL_COMMUNITY): Payer: Self-pay

## 2021-02-08 ENCOUNTER — Emergency Department (HOSPITAL_COMMUNITY): Payer: Medicare Other

## 2021-02-08 ENCOUNTER — Other Ambulatory Visit: Payer: Self-pay

## 2021-02-08 ENCOUNTER — Emergency Department (HOSPITAL_COMMUNITY)
Admission: EM | Admit: 2021-02-08 | Discharge: 2021-02-08 | Disposition: A | Discharge status: Home / Self Care | Payer: Medicare Other | Attending: Emergency Medicine | Admitting: Emergency Medicine

## 2021-02-08 ENCOUNTER — Ambulatory Visit: Payer: Medicare Other | Admitting: Physician Assistant

## 2021-02-08 DIAGNOSIS — M19041 Primary osteoarthritis, right hand: Secondary | ICD-10-CM

## 2021-02-08 DIAGNOSIS — M170 Bilateral primary osteoarthritis of knee: Secondary | ICD-10-CM

## 2021-02-08 DIAGNOSIS — Z79899 Other long term (current) drug therapy: Secondary | ICD-10-CM

## 2021-02-08 DIAGNOSIS — H209 Unspecified iridocyclitis: Secondary | ICD-10-CM

## 2021-02-08 DIAGNOSIS — M5136 Other intervertebral disc degeneration, lumbar region: Secondary | ICD-10-CM

## 2021-02-08 DIAGNOSIS — N182 Chronic kidney disease, stage 2 (mild): Secondary | ICD-10-CM | POA: Insufficient documentation

## 2021-02-08 DIAGNOSIS — R200 Anesthesia of skin: Secondary | ICD-10-CM

## 2021-02-08 DIAGNOSIS — L299 Pruritus, unspecified: Secondary | ICD-10-CM

## 2021-02-08 DIAGNOSIS — J449 Chronic obstructive pulmonary disease, unspecified: Secondary | ICD-10-CM | POA: Insufficient documentation

## 2021-02-08 DIAGNOSIS — R002 Palpitations: Secondary | ICD-10-CM | POA: Insufficient documentation

## 2021-02-08 DIAGNOSIS — Z87891 Personal history of nicotine dependence: Secondary | ICD-10-CM

## 2021-02-08 DIAGNOSIS — Z8709 Personal history of other diseases of the respiratory system: Secondary | ICD-10-CM

## 2021-02-08 DIAGNOSIS — Z7951 Long term (current) use of inhaled steroids: Secondary | ICD-10-CM | POA: Insufficient documentation

## 2021-02-08 DIAGNOSIS — M47819 Spondylosis without myelopathy or radiculopathy, site unspecified: Secondary | ICD-10-CM

## 2021-02-08 DIAGNOSIS — F17210 Nicotine dependence, cigarettes, uncomplicated: Secondary | ICD-10-CM | POA: Diagnosis not present

## 2021-02-08 DIAGNOSIS — M0630 Rheumatoid nodule, unspecified site: Secondary | ICD-10-CM

## 2021-02-08 DIAGNOSIS — Z8679 Personal history of other diseases of the circulatory system: Secondary | ICD-10-CM

## 2021-02-08 DIAGNOSIS — M7022 Olecranon bursitis, left elbow: Secondary | ICD-10-CM

## 2021-02-08 DIAGNOSIS — I129 Hypertensive chronic kidney disease with stage 1 through stage 4 chronic kidney disease, or unspecified chronic kidney disease: Secondary | ICD-10-CM | POA: Insufficient documentation

## 2021-02-08 DIAGNOSIS — M0609 Rheumatoid arthritis without rheumatoid factor, multiple sites: Secondary | ICD-10-CM

## 2021-02-08 DIAGNOSIS — Z8659 Personal history of other mental and behavioral disorders: Secondary | ICD-10-CM

## 2021-02-08 LAB — COMPREHENSIVE METABOLIC PANEL
ALT: 18 U/L (ref 0–44)
AST: 18 U/L (ref 15–41)
Albumin: 4.6 g/dL (ref 3.5–5.0)
Alkaline Phosphatase: 71 U/L (ref 38–126)
Anion gap: 10 (ref 5–15)
BUN: 12 mg/dL (ref 8–23)
CO2: 25 mmol/L (ref 22–32)
Calcium: 10.1 mg/dL (ref 8.9–10.3)
Chloride: 103 mmol/L (ref 98–111)
Creatinine, Ser: 0.62 mg/dL (ref 0.44–1.00)
GFR, Estimated: >60 mL/min (ref >60)
Glucose, Bld: 98 mg/dL (ref 70–99)
Potassium: 3.9 mmol/L (ref 3.5–5.1)
Sodium: 138 mmol/L (ref 135–145)
Total Bilirubin: 1.5 mg/dL — ABNORMAL HIGH (ref 0.3–1.2)
Total Protein: 7.7 g/dL (ref 6.5–8.1)

## 2021-02-08 LAB — CBC WITH DIFFERENTIAL/PLATELET
Abs Immature Granulocytes: 0.08 10*3/uL — ABNORMAL HIGH (ref 0.00–0.07)
Basophils Absolute: 0.1 10*3/uL (ref 0.0–0.1)
Basophils Relative: 1 %
Eosinophils Absolute: 0.3 10*3/uL (ref 0.0–0.5)
Eosinophils Relative: 4 %
HCT: 43.2 % (ref 36.0–46.0)
Hemoglobin: 15.1 g/dL — ABNORMAL HIGH (ref 12.0–15.0)
Immature Granulocytes: 1 %
Lymphocytes Relative: 23 %
Lymphs Abs: 1.8 10*3/uL (ref 0.7–4.0)
MCH: 31.7 pg (ref 26.0–34.0)
MCHC: 35 g/dL (ref 30.0–36.0)
MCV: 90.6 fL (ref 80.0–100.0)
Monocytes Absolute: 0.8 10*3/uL (ref 0.1–1.0)
Monocytes Relative: 10 %
Neutro Abs: 4.8 10*3/uL (ref 1.7–7.7)
Neutrophils Relative %: 61 %
Platelets: 227 10*3/uL (ref 150–400)
RBC: 4.77 MIL/uL (ref 3.87–5.11)
RDW: 13.2 % (ref 11.5–15.5)
WBC: 7.9 10*3/uL (ref 4.0–10.5)
nRBC: 0 % (ref 0.0–0.2)

## 2021-02-08 LAB — URINALYSIS, ROUTINE W REFLEX MICROSCOPIC
Bilirubin Urine: NEGATIVE
Glucose, UA: NEGATIVE mg/dL
Hgb urine dipstick: NEGATIVE
Ketones, ur: 5 mg/dL — AB
Nitrite: NEGATIVE
Protein, ur: NEGATIVE mg/dL
Specific Gravity, Urine: 1.019 (ref 1.005–1.030)
pH: 6 (ref 5.0–8.0)

## 2021-02-08 NOTE — ED Triage Notes (Signed)
Patient reports that when she lays down she feels like her heart is irregular and states been feeling x 1 year.  Patient c/o "vaginal itching, itching all over" x 2 months and "diarrhea x 1 month."

## 2021-02-08 NOTE — ED Provider Notes (Signed)
[facility] EMERGENCY DEPARTMENT Provider Note  CSN: [id number] Arrival date & time: 02/08/21 1448    History Chief Complaint  Patient presents with   Pruritis    Kirsten Harper is a 75 y.o. female with history of ankylosing spondylitis presents to the ED with a long list of chronic complaints. She has indicated her primary concern is itching all over that has been ongoing for several weeks. She also complaints of palpitations for a year, diarrhea attributed to her immunosuppressants, joint pain for 40 years, vaginal itching she thought was a yeast infection ongoing for 2 months and not improved with OTC monistat, associated with urinary frequency. No CP, SOB, fever, N/V. She has not addressed many of these concerns with her PCP.    Past Medical History:  Diagnosis Date   Ankylosing spondylitis (HCC)    Anxiety    Arthritis    Chronic lower back pain    CKD (chronic kidney disease), stage II    "related to the Ankylosing spondylitis" (04/10/2016)   COPD (chronic obstructive pulmonary disease) ([place])    Eye disease    Hypertension    PONV (postoperative nausea and vomiting)    "didn't bother me the last time I had it in ~ 2015"    Past Surgical History:  Procedure Laterality Date   BROW LIFT AND BLEPHAROPLASTY Bilateral ~ 2015   CATARACT EXTRACTION W/ INTRAOCULAR LENS  IMPLANT, BILATERAL Bilateral 2000s   CYST REMOVAL TRUNK  12/2018   MANDIBLE SURGERY  ~ 2000   "jaw had moved to left; broke my jaw in 5 places; put titanium in; stripped muscle from bone; wired jaw shut"   [place]    Family History  Problem Relation Age of Onset   Hypertension Mother    Heart disease Sister    Diabetes Sister    Pulmonary fibrosis Brother    Idiopathic pulmonary fibrosis Brother    Alzheimer's disease Brother    Dementia Brother    Frontotemporal dementia Brother     Social History   Tobacco Use   Smoking status: Every Day    Packs/day:  1.00    Years: 51.00    Pack years: 51.00    Types: Cigarettes   Smokeless tobacco: Never  Vaping Use   Vaping Use: Never used  Substance Use Topics   Alcohol use: Yes    Alcohol/week: 0.0 standard drinks    Comment: occ   Drug use: No     Home Medications Prior to Admission medications   Medication Sig Start Date End Date Taking? Authorizing Provider  leflunomide (ARAVA) 20 MG tablet TAKE 1 TABLET BY MOUTH EVERY DAY 01/05/21   Ofilia Neas, PA-C  Adalimumab (HUMIRA PEN) 40 MG/0.4ML PNKT Inject 40 mg into the skin every 14 (fourteen) days. 12/28/20   Ofilia Neas, PA-C  albuterol (VENTOLIN HFA) 108 (90 Base) MCG/ACT inhaler INHALE 1 TO 2 PUFFS INTO THE LUNGS EVERY 6 HOURS AS NEEDED 12/29/20   Venia Carbon, MD  ALPRAZolam Duanne Moron) 0.5 MG tablet Take 0.5 mg by mouth 2 (two) times daily. 09/11/20   [provider]  amLODipine (NORVASC) 5 MG tablet Take 1 tablet (5 mg total) by mouth daily. Patient taking differently: Take 5 mg by mouth daily. Pt states she has been taking 2 a day 09/14/20   Venia Carbon, MD  dorzolamide-timolol (COSOPT) 22.3-6.8 MG/ML ophthalmic solution Place 1 drop into both eyes 2 (two)  times daily.  12/17/16   [provider]  escitalopram (LEXAPRO) 10 MG tablet Take 10 mg by mouth daily. 03/07/20   [provider]  fluticasone (FLONASE) 50 MCG/ACT nasal spray Place into both nostrils. 06/26/20   [provider]  losartan (COZAAR) 100 MG tablet TAKE 1 TABLET BY MOUTH EVERY DAY Patient taking differently: Pt states she is taking 2 a day 10/02/20   Venia Carbon, MD  LOTEMAX 0.5 % ophthalmic suspension Place 1 drop into both eyes 2 (two) times daily.  12/17/16   [provider]  predniSONE (DELTASONE) 5 MG tablet Take 2 tablets by mouth daily for 1 week, then take 1.5 tabs by mouth daily for 1 week, then take 1 tab by mouth daily for 1 week, then 1/2 tab by mouth daily for 1 week. 11/09/20   Bo Merino, MD   rOPINIRole (REQUIP) 1 MG tablet TAKE 3 TABLETS (3 MG TOTAL) BY MOUTH AT BEDTIME. 12/18/20   Venia Carbon, MD     Allergies    Amoxapine and related, Seroquel [quetiapine], and Other   Review of Systems   Review of Systems A comprehensive review of systems was completed and negative except as noted in HPI.    Physical Exam BP 140/83   Pulse 87   Temp 98.3 F (36.8 C) (Oral)   Resp 18   Ht 5' 7.5" (1.715 m)   Wt 77.1 kg   SpO2 96%   BMI 26.23 kg/m   Physical Exam Vitals and nursing note reviewed.  Constitutional:      Appearance: Normal appearance.  HENT:     Head: Normocephalic and atraumatic.     Nose: Nose normal.     Mouth/Throat:     Mouth: Mucous membranes are moist.  Eyes:     Extraocular Movements: Extraocular movements intact.     Conjunctiva/sclera: Conjunctivae normal.  Cardiovascular:     Rate and Rhythm: Normal rate.  Pulmonary:     Effort: Pulmonary effort is normal.     Breath sounds: Normal breath sounds.  Abdominal:     General: Abdomen is flat.     Palpations: Abdomen is soft.     Tenderness: There is no abdominal tenderness.  Musculoskeletal:        General: No swelling. Normal range of motion.     Cervical back: Neck supple.  Skin:    General: Skin is warm and dry.     Findings: No rash.  Neurological:     General: No focal deficit present.     Mental Status: She is alert.  Psychiatric:        Mood and Affect: Mood normal.     ED Results / Procedures / Treatments   Labs (all labs ordered are listed, but only abnormal results are displayed) Labs Reviewed  COMPREHENSIVE METABOLIC PANEL - Abnormal; Notable for the following components:      Result Value   Total Bilirubin 1.5 (*)    All other components within normal limits  CBC WITH DIFFERENTIAL/PLATELET - Abnormal; Notable for the following components:   Hemoglobin 15.1 (*)    Abs Immature Granulocytes 0.08 (*)    All other components within normal limits  URINALYSIS,  ROUTINE W REFLEX MICROSCOPIC - Abnormal; Notable for the following components:   APPearance HAZY (*)    Ketones, ur 5 (*)    Leukocytes,Ua TRACE (*)    Bacteria, UA RARE (*)    All other components within normal limits  EKG EKG Interpretation  Date/Time:  Thursday February 08 2021 15:16:59 EDT Ventricular Rate:  88 PR Interval:  142 QRS Duration: 132 QT Interval:  422 QTC Calculation: 510 R Axis:   98 Text Interpretation: Normal sinus rhythm Right bundle branch block T wave abnormality, consider lateral ischemia Abnormal ECG Since last tracing 31-MAR- 2018 Right bundle branch block and t-wave abnormalities are new Confirmed by Calvert Cantor [phone]) on 02/08/2021 4:55:42 PM  Radiology DG Chest 2 View  Result Date: 02/08/2021 CLINICAL DATA:  Shortness of breath, smoker EXAM: CHEST - 2 VIEW COMPARISON:  01/08/2017 FINDINGS: Heart and mediastinal contours are within normal limits. No focal opacities or effusions. Mild compression fracture noted in the lower thoracic spine, age indeterminate but new since 2018. IMPRESSION: No acute cardiopulmonary disease. Mild compression fracture in the lower thoracic spine, new since 2018. Electronically Signed   By: Rolm Baptise M.D.   On: 02/08/2021 16:10    Procedures Procedures  Medications Ordered in the ED Medications - No data to display   MDM Rules/Calculators/A&P MDM Patient with a multitude of vague and unrelated complaints. Labs and CXR ordered from triage reviewed, she has a mildly elevated bilirubin, which is similar to labs done 3 months ago, but may be contributing to her pruritis. She has non-specific EKG changes from prior EKG but no rhythm abnormalities to account for her palpitations. She has not had a urine specimen checked. Advised that she will need to discuss most of her chronic issues with her PCP. We will address the possible UTI today, although those symptoms have also been ongoing for weeks so unlikely to be an ascending UTI  or sepsis.   ED Course  I have reviewed the triage vital signs and the nursing notes.  Pertinent labs & imaging results that were available during my care of the patient were reviewed by me and considered in my medical decision making (see chart for details).  Clinical Course as of 02/08/21 1847  Thu Feb 08, 2021  1827 UA is neg for infection. Will recommend PCP follow up for further evaluation of her symptoms.  [CS]    Clinical Course User Index [CS] Truddie Hidden, MD    Final Clinical Impression(s) / ED Diagnoses Final diagnoses:  Pruritus  Hyperbilirubinemia    Rx / DC Orders ED Discharge Orders     None        Truddie Hidden, MD 02/08/21 [phone]

## 2021-02-08 NOTE — ED Provider Notes (Signed)
Emergency Medicine Provider Triage Evaluation Note  Kirsten Harper , a 75 y.o. female  was evaluated in triage.  Pt complains of pruritis a week ago. States that it is everywhere. Some SOB, attributes to smoking more lately. NO CP. No new meds.   Review of Systems  Positive: Pruritis, sob Negative: Fevers, cp  Physical Exam  BP (!) 152/90 (BP Location: Left Arm)   Pulse 97   Temp 98.3 F (36.8 C) (Oral)   Resp 16   Ht 5' 7.5" (1.715 m)   Wt 77.1 kg   SpO2 100%   BMI 26.23 kg/m  Gen:   Awake, no distress   Resp:  Normal effort  MSK:   Moves extremities without difficulty  Other:    Medical Decision Making  Medically screening exam initiated at 3:22 PM.  Appropriate orders placed.  Kirsten Harper was informed that the remainder of the evaluation will be completed by another provider, this initial triage assessment does not replace that evaluation, and the importance of remaining in the ED until their evaluation is complete.     Alfredia Client, PA-C 02/08/21 1523    Quintella Reichert, MD 02/10/21 [phone]

## 2021-02-11 ENCOUNTER — Other Ambulatory Visit: Payer: Self-pay | Admitting: Physician Assistant

## 2021-02-15 ENCOUNTER — Telehealth: Payer: Self-pay | Admitting: Internal Medicine

## 2021-02-15 NOTE — Telephone Encounter (Signed)
Kirsten Harper called in and stated that she was seen in the hospital and needed to get a follow up. Dr. Silvio Pate next afternoon is 8/26 @ 12. Wanted to know if he can squeeze her in earlier

## 2021-02-15 NOTE — Telephone Encounter (Signed)
Left message for pt that the appt is for 1:30 pm not 11:30 am. I have scheduled the appt.

## 2021-02-15 NOTE — Telephone Encounter (Addendum)
Spoke to pt. She has another appt on 8-9 at 1:30 so she cannot do that time.

## 2021-02-15 NOTE — Telephone Encounter (Signed)
Kirsten Harper called in and stated that she can do 8/11 @ 1130

## 2021-02-15 NOTE — Telephone Encounter (Signed)
Left message on VM for pt to call the office and let them know Dr Silvio Pate has given dates/times to see her. Please see note from Dr Silvio Pate below.

## 2021-02-22 ENCOUNTER — Ambulatory Visit: Payer: Medicare Other | Admitting: Internal Medicine

## 2021-03-09 ENCOUNTER — Inpatient Hospital Stay: Payer: Medicare Other | Admitting: Internal Medicine

## 2021-03-13 ENCOUNTER — Other Ambulatory Visit: Payer: Self-pay | Admitting: Rheumatology

## 2021-03-14 ENCOUNTER — Other Ambulatory Visit: Payer: Self-pay

## 2021-03-14 MED ORDER — PREDNISONE 5 MG PO TABS: ORAL_TABLET | ORAL | 0 refills | Status: DC

## 2021-03-14 NOTE — Telephone Encounter (Signed)
Attempted to contact patient and left message for patient to call the office. Prescription sent in prednisone taper starting at 20 mg tapering by 5 mg every 4 days.

## 2021-03-14 NOTE — Telephone Encounter (Signed)
Patient called requesting prescription refill of Prednisone to be sent to [facility] at [address] [facility] [address] [facility].  Patient states she called the pharmacy to request the refill, but was told she needed to contact our office.

## 2021-03-14 NOTE — Telephone Encounter (Signed)
Patient states she is having a flare in her wrist, knees,right elbow, left ankle and right thumb ane right with and 5th fingers. Patient states she is also having pain in her left hip. Patient states she is having swelling in right fingers and left ankle. Patient states she is taking Humira as prescribed and has not missed any doses. Patient is not taking Arava due to having the side effect of severe diarrhea. Patient is requesting a  prescription for Prednisone to be sent to the [facility] on [address].

## 2021-03-14 NOTE — Telephone Encounter (Signed)
Patient advised prednisone taper starting at 20 mg tapering by 5 mg every 4 days is being sent to the pharmacy. Patient advised to take prednisone in the morning with food and to avoid NSAID use.  Patient advised if she continues to have recurrent flares she will need to schedule an appointment to discuss other treatment options/combination therapy. Patient expressed understanding.

## 2021-03-14 NOTE — Telephone Encounter (Signed)
Ok to send in prednisone taper starting at 20 mg tapering by 5 mg every 4 days. Please advise the patient to take prednisone in the morning with food and to avoid NSAID use.  If she continues to have recurrent flares she will need to schedule an appointment to discuss other treatment options/combination therapy.

## 2021-03-28 ENCOUNTER — Other Ambulatory Visit: Payer: Self-pay | Admitting: Internal Medicine

## 2021-03-29 NOTE — Progress Notes (Signed)
Office Visit Note  Patient: Kirsten Harper             Date of Birth: 01-Sep-1945           MRN: [id number]             PCP: Kirsten Carbon, MD Referring: Kirsten Carbon, MD Visit Date: 03/30/2021 Occupation: _0 @  Subjective:  Pain in multiple joints   History of Present Illness: Kirsten Harper is a 75 y.o. female with a history of seropositive rheumatoid arthritis and osteoarthritis.  She states she continues to have pain and discomfort in multiple joints.  She has intermittent swelling in her hands and feet.  She has been having discomfort in her ankles when she walks.  She tried leflunomide but had to discontinue due to diarrhea.  She has taken methotrexate in the past but discontinued it due to nausea.  She states she is willing to try methotrexate again.  She has been taking Humira on a regular basis.  Activities of Daily Living:  Patient reports morning stiffness for 1-2 hours.   Patient Denies nocturnal pain.  Difficulty dressing/grooming: Denies Difficulty climbing stairs: Denies Difficulty getting out of chair: Reports Difficulty using hands for taps, buttons, cutlery, and/or writing: Reports  Review of Systems  Constitutional:  Positive for fatigue.  HENT:  Negative for mouth sores, mouth dryness and nose dryness.   Eyes:  Positive for itching. Negative for pain and dryness.  Respiratory:  Negative for shortness of breath and difficulty breathing.   Cardiovascular:  Negative for chest pain and palpitations.  Gastrointestinal:  Negative for blood in stool, constipation and diarrhea.  Endocrine: Negative for increased urination.  Genitourinary:  Negative for difficulty urinating.  Musculoskeletal:  Positive for joint pain, joint pain, joint swelling and morning stiffness. Negative for myalgias, muscle tenderness and myalgias.  Skin:  Negative for color change, rash and redness.  Allergic/Immunologic: Negative for susceptible to infections.  Neurological:   Positive for numbness. Negative for dizziness, headaches and memory loss.  Hematological:  Negative for bruising/bleeding tendency.  Psychiatric/Behavioral:  Negative for confusion.    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   Spondyloarthropathy HLA-B27 positive 06/06/2016   Iritis 06/06/2016   High risk medication use 06/06/2016   Primary osteoarthritis of both hands 06/06/2016   Smoker 06/06/2016   COPD exacerbation ([place]) 04/10/2016   Anxiety 04/10/2016   Essential hypertension     Past Medical History:  Diagnosis Date   Ankylosing spondylitis (HCC)    Anxiety    Arthritis    Chronic lower back pain    CKD (chronic kidney disease), stage II    "related to the Ankylosing spondylitis" (04/10/2016)   COPD (chronic obstructive pulmonary disease) ([place])    Eye disease    Hypertension    PONV (postoperative nausea and vomiting)    "didn't bother me the last time I had it in ~ 2015"    Family History  Problem Relation Age of Onset   Hypertension Mother    Heart disease Sister  Diabetes Sister    Pulmonary fibrosis Brother    Idiopathic pulmonary fibrosis Brother    Alzheimer's disease Brother    Dementia Brother    Frontotemporal dementia Brother    Past Surgical History:  Procedure Laterality Date   BROW LIFT AND BLEPHAROPLASTY Bilateral ~ 2015   CATARACT EXTRACTION W/ INTRAOCULAR LENS  IMPLANT, BILATERAL Bilateral 2000s   CYST REMOVAL TRUNK  12/2018   MANDIBLE SURGERY  ~ 2000   "jaw had moved  to left; broke my jaw in 5 places; put titanium in; stripped muscle from bone; wired jaw shut"   [place]   Social History   Social History Narrative   2 sons--not in area   Lives on property with her mother Kirsten Harper      Has living will   Would want one of her sons, Kirsten Harper, to be Media planner   Requests DNR   No tube feeds   Immunization History  Administered Date(s) Administered   Influenza Inj Mdck Quad Pf 04/16/2018   Influenza, High Dose Seasonal PF 04/09/2019, 02/22/2020   Influenza-Unspecified 04/09/2019   Moderna Sars-Covid-2 Vaccination 09/06/2019, 10/05/2019, 03/19/2020   Pneumococcal Conjugate-13 05/10/2013   Pneumococcal Polysaccharide-23 09/20/2014   Tdap 04/30/2011   Zoster Recombinat (Shingrix) 10/01/2016     Objective: Vital Signs: BP 116/76 (BP Location: Left Arm, Patient Position: Sitting, Cuff Size: Normal)   Pulse 88   Ht 5' 7.5" (1.715 m)   Wt 171 lb (77.6 kg)   BMI 26.39 kg/m    Physical Exam Vitals and nursing note reviewed.  Constitutional:      Appearance: She is well-developed.  HENT:     Head: Normocephalic and atraumatic.  Eyes:     Conjunctiva/sclera: Conjunctivae normal.  Cardiovascular:     Rate and Rhythm: Normal rate and regular rhythm.     Heart sounds: Normal heart sounds.  Pulmonary:     Effort: Pulmonary effort is normal.     Breath sounds: Normal breath sounds.  Abdominal:     General: Bowel sounds are normal.     Palpations: Abdomen is soft.  Musculoskeletal:     Cervical back: Normal range of motion.  Lymphadenopathy:     Cervical: No cervical adenopathy.  Skin:    General: Skin is warm and dry.     Capillary Refill: Capillary refill takes less than 2 seconds.  Neurological:     Mental Status: She is alert and oriented to person, place, and time.  Psychiatric:        Behavior: Behavior normal.     Musculoskeletal Exam: She had a stiffness with range of motion of the  cervical spine.  Shoulder joints, elbow joints and wrist joints in good range of motion.  She had tenderness on palpation of her right wrist joints.  There was tenderness over some of the MCPs and PIP joints as described below.  Hip joints and knee joints in good range of motion.  She had tenderness on palpation of bilateral ankles and MTPs.  CDAI Exam: CDAI Score: 3.8  Patient Global: 5 mm; Provider Global: 3 mm Swollen: 1 ; Tender: 5  Joint Exam 03/30/2021      Right  Left  Wrist   Tender     PIP 4  Swollen Tender     Ankle   Tender   Tender  MTP 5      Tender     Investigation: No additional findings.  Imaging: No results found.  Recent Labs: Lab Results  Component Value Date   WBC 7.9 02/08/2021   HGB 15.1 (H) 02/08/2021   PLT 227 02/08/2021   NA 138 02/08/2021   K 3.9 02/08/2021   CL 103 02/08/2021   CO2 25 02/08/2021   GLUCOSE 98 02/08/2021   BUN 12 02/08/2021   CREATININE 0.62 02/08/2021   BILITOT 1.5 (H) 02/08/2021   ALKPHOS 71 02/08/2021   AST 18 02/08/2021   ALT 18 02/08/2021   PROT 7.7 02/08/2021   ALBUMIN 4.6 02/08/2021   CALCIUM 10.1 02/08/2021   GFRAA 92 11/06/2020   QFTBGOLDPLUS NEGATIVE 11/06/2020    Speciality Comments: Methotrexate-nausea, Arava started March 21, 2020-diarrhea Humira January 22, 2017  Procedures:  No procedures performed Allergies: Amoxapine and related, Seroquel [quetiapine], and Other   Assessment / Plan:     Visit Diagnoses: Rheumatoid arthritis of multiple sites with negative rheumatoid factor ([place]) - Erosive changes, RF-, anti-CCP-, 14-3-3 eta-: She continues to have pain and discomfort in her joints.  She tried leflunomide but discontinued due to diarrhea.  She is willing to try methotrexate again.  She had nausea in the past.  We discussed the option of subcutaneous methotrexate.  A handout was given and consent was taken.  We will start her on methotrexate 0.4 mL subcu weekly if tolerated the dose can be increased in the  future.  Folic acid 1 mg p.o. daily. Zofran 4 mg p.o. every 6 hours as needed nausea.  Drug Counseling TB Gold: negative 11/06/20 Hepatitis panel: negative 01/18/14 HIV negative 07/28/18 SPEP 04/08/17  Chest-xray:  No acute cardiopulmonary disease on 02/08/21.   Contraception: Postmenopausal   Patient was counseled on the purpose, proper use, and adverse effects of methotrexate including nausea, infection, and signs and symptoms of pneumonitis.  Reviewed instructions with patient to take methotrexate weekly along with folic acid daily.  Discussed the importance of frequent monitoring of kidney and liver function and blood counts, and provided patient with standing lab instructions.  Counseled patient to avoid NSAIDs and alcohol while on methotrexate.  Provided patient with educational materials on methotrexate and answered all questions.  Advised patient to get annual influenza vaccine and to get a pneumococcal vaccine if patient has not already had one.  Patient voiced understanding.  Patient consented to methotrexate use.  Will upload into chart.     Rheumatoid nodulosis (HCC)  High risk medication use - Humira 40 mg sq injections every 14 days.  Labs obtained on February 08, 2021 were within normal limits.  TB gold was negative on November 06, 2020.  She will need labs 2 weeks after starting methotrexate and then every 3 months.  Uveitis - Dr. Manuella Ghazi.  Recurrent flares in the past.  Patient states that she has had some watering and redness in her eyes.  She was told to come for a follow-up visit in months.  Numbness of right thumb-  Primary osteoarthritis of both hands-joint protection muscle strengthening was discussed.  Primary osteoarthritis of both knees-she is ongoing pain in her knee joints due to osteoarthritis.  No synovitis was noted.  DDD (degenerative disc disease), lumbar - X-rays of her lumbar spine from August 26, 2019 were consistent with degenerative disc disease.  HLA B 27  Positive   History of hypertension-blood pressure was normal today.  History of anxiety  History of COPD  Former smoker  Orders: No orders of the defined types were placed in this encounter.  Meds ordered this  encounter  Medications   ondansetron (ZOFRAN) 4 MG tablet    Sig: Take 1 tablet (4 mg total) by mouth every 8 (eight) hours as needed for nausea or vomiting.    Dispense:  20 tablet    Refill:  0   methotrexate 50 MG/2ML injection    Sig: Inject 0.4 mLs (10 mg total) into the skin once a week.    Dispense:  5 mL    Refill:  0   folic acid (FOLVITE) 1 MG tablet    Sig: Take 1 tablet (1 mg total) by mouth daily.    Dispense:  90 tablet    Refill:  0   TUBERCULIN SYR 1CC/27GX1/2" (B-D TB SYRINGE 1CC/27GX1/2") 27G X 1/2" 1 ML MISC    Sig: 12 Syringes by Does not apply route once a week.    Dispense:  12 each    Refill:  3     Follow-Up Instructions: Return in about 6 weeks (around 05/11/2021) for Rheumatoid arthritis.   Bo Merino, MD  Note - This record has been created using Editor, commissioning.  Chart creation errors have been sought, but may not always  have been located. Such creation errors do not reflect on  the standard of medical care.

## 2021-03-30 ENCOUNTER — Ambulatory Visit (INDEPENDENT_AMBULATORY_CARE_PROVIDER_SITE_OTHER): Payer: Medicare Other | Admitting: Rheumatology

## 2021-03-30 ENCOUNTER — Encounter: Payer: Self-pay | Admitting: Rheumatology

## 2021-03-30 ENCOUNTER — Other Ambulatory Visit: Payer: Self-pay

## 2021-03-30 VITALS — BP 116/76 | HR 88 | Ht 67.5 in | Wt 171.0 lb

## 2021-03-30 DIAGNOSIS — Z8659 Personal history of other mental and behavioral disorders: Secondary | ICD-10-CM

## 2021-03-30 DIAGNOSIS — M7022 Olecranon bursitis, left elbow: Secondary | ICD-10-CM

## 2021-03-30 DIAGNOSIS — M5136 Other intervertebral disc degeneration, lumbar region: Secondary | ICD-10-CM

## 2021-03-30 DIAGNOSIS — Z8679 Personal history of other diseases of the circulatory system: Secondary | ICD-10-CM

## 2021-03-30 DIAGNOSIS — M19041 Primary osteoarthritis, right hand: Secondary | ICD-10-CM

## 2021-03-30 DIAGNOSIS — M0609 Rheumatoid arthritis without rheumatoid factor, multiple sites: Secondary | ICD-10-CM | POA: Diagnosis not present

## 2021-03-30 DIAGNOSIS — M19042 Primary osteoarthritis, left hand: Secondary | ICD-10-CM

## 2021-03-30 DIAGNOSIS — Z87891 Personal history of nicotine dependence: Secondary | ICD-10-CM

## 2021-03-30 DIAGNOSIS — M0630 Rheumatoid nodule, unspecified site: Secondary | ICD-10-CM

## 2021-03-30 DIAGNOSIS — M170 Bilateral primary osteoarthritis of knee: Secondary | ICD-10-CM

## 2021-03-30 DIAGNOSIS — R200 Anesthesia of skin: Secondary | ICD-10-CM

## 2021-03-30 DIAGNOSIS — Z8709 Personal history of other diseases of the respiratory system: Secondary | ICD-10-CM

## 2021-03-30 DIAGNOSIS — Z79899 Other long term (current) drug therapy: Secondary | ICD-10-CM | POA: Diagnosis not present

## 2021-03-30 DIAGNOSIS — M51369 Other intervertebral disc degeneration, lumbar region without mention of lumbar back pain or lower extremity pain: Secondary | ICD-10-CM

## 2021-03-30 DIAGNOSIS — H209 Unspecified iridocyclitis: Secondary | ICD-10-CM

## 2021-03-30 DIAGNOSIS — M47819 Spondylosis without myelopathy or radiculopathy, site unspecified: Secondary | ICD-10-CM

## 2021-03-30 MED ORDER — "TUBERCULIN SYRINGE 27G X 1/2"" 1 ML MISC": 12.0000 | 3 refills | Status: AC

## 2021-03-30 MED ORDER — FOLIC ACID 1 MG PO TABS: 1.0000 mg | ORAL_TABLET | Freq: Every day | ORAL | 0 refills | Status: DC

## 2021-03-30 MED ORDER — METHOTREXATE SODIUM CHEMO INJECTION 50 MG/2ML: 10.0000 mg | INTRAMUSCULAR | 0 refills | Status: DC

## 2021-03-30 MED ORDER — ONDANSETRON HCL 4 MG PO TABS: 4.0000 mg | ORAL_TABLET | Freq: Three times a day (TID) | ORAL | 0 refills | Status: DC | PRN

## 2021-03-30 NOTE — Patient Instructions (Addendum)
Standing Labs We placed an order today for your standing lab work.   Please have your standing labs drawn in 2 weeks and then every 3 months   If possible, please have your labs drawn 2 weeks prior to your appointment so that the provider can discuss your results at your appointment.  Please note that you may see your imaging and lab results in [facility] before we have reviewed them. We may be awaiting multiple results to interpret others before contacting you. Please allow our office up to 72 hours to thoroughly review all of the results before contacting the office for clarification of your results.  We have open lab daily: Monday through Thursday from 1:30-4:30 PM and Friday from 1:30-4:00 PM at the office of Dr. Bo Merino, [facility].   Please be advised, all patients with office appointments requiring lab work will take precedent over walk-in lab work.  If possible, please come for your lab work on Monday and Friday afternoons, as you may experience shorter wait times. The office is located at [address], [place], [place], [place] [address] No appointment is necessary.   Labs are drawn by [facility]. Please bring your co-pay at the time of your lab draw.  You may receive a bill from [facility] for your lab work.  If you wish to have your labs drawn at another location, please call the office 24 hours in advance to send orders.  If you have any questions regarding directions or hours of operation,  please call [phone].   As a reminder, please drink plenty of water prior to coming for your lab work. Thanks!  Vaccines You are taking a medication(s) that can suppress your immune system.  The following immunizations are recommended: Flu annually Covid-19  Td/Tdap (tetanus, diphtheria, pertussis) every 10 years Pneumonia (Prevnar 15 then Pneumovax 23 at least 1 year apart.  Alternatively, can take Prevnar 20 without needing additional dose) Shingrix: 2 doses from 4  weeks to 6 months apart  Please check with your PCP to make sure you are up to date.   If you test POSITIVE for COVID19 and have MILD to MODERATE symptoms: First, call your PCP if you would like to receive COVID19 treatment AND Hold your medications during the infection and for at least 1 week after your symptoms have resolved: Injectable medication (Benlysta, Cimzia, Cosentyx, Enbrel, Humira, Orencia, Remicade, Simponi, Stelara, Taltz, Tremfya) Methotrexate Leflunomide (Arava) Azathioprine Mycophenolate (Cellcept) Roma Kayser, or Rinvoq Otezla If you take Actemra or Kevzara, you DO NOT need to hold these for COVID19 infection.  If you test POSITIVE for COVID19 and have NO symptoms: First, call your PCP if you would like to receive COVID19 treatment AND Hold your medications for at least 10 days after the day that you tested positive Injectable medication (Benlysta, Cimzia, Cosentyx, Enbrel, Humira, Orencia, Remicade, Simponi, Stelara, Taltz, Tremfya) Methotrexate Leflunomide (Arava) Azathioprine Mycophenolate (Cellcept) Roma Kayser, or Rinvoq Otezla If you take Actemra or Kevzara, you DO NOT need to hold these for COVID19 infection.  If you have signs or symptoms of an infection or start antibiotics: First, call your PCP for workup of your infection. Hold your medication through the infection, until you complete your antibiotics, and until symptoms resolve if you take the following: Injectable medication (Actemra, Benlysta, Cimzia, Cosentyx, Enbrel, Humira, Kevzara, Orencia, Remicade, Simponi, Stelara, Taltz, Tremfya) Methotrexate Leflunomide (Arava) Mycophenolate (Cellcept) Morrie Sheldon, Olumiant, or Rinvoq    Methotrexate Injection What is this medication? METHOTREXATE (METH oh TREX ate)  treats inflammatory conditions such as arthritis and psoriasis. It works by decreasing inflammation, which can reduce pain and prevent long-term injury to the joints and skin. It may  also be used to treat some types of cancer. It works by slowing down the growth of cancer cells. This medicine may be used for other purposes; ask your health care provider or pharmacist if you have questions. What should I tell my care team before I take this medication? They need to know if you have any of these conditions: Fluid in the stomach area or lungs If you often drink alcohol Infection or immune system problems Kidney disease Liver disease Low blood counts (Paget cells, platelets, or red blood cells) Lung disease Recent or ongoing radiation Recent or upcoming vaccine Stomach ulcers Ulcerative colitis An unusual or allergic reaction to methotrexate, other medications, foods, dyes, or preservatives Pregnant or trying to get pregnant Breast-feeding How should I use this medication? This medication is for infusion into a vein or for injection into muscle or into the spinal fluid (whichever applies). It is usually given in a hospital or clinic setting. In rare cases, you might get this medication at home. You will be taught how to give this medication. Use exactly as directed. Take your medication at regular intervals. Do not take your medication more often than directed. If this medication is used for arthritis or psoriasis, it should be taken weekly, NOT daily. It is important that you put your used needles and syringes in a special sharps container. Do not put them in a trash can. If you do not have a sharps container, call your pharmacist or care team to get one. Talk to your care team about the use of this medication in children. While this medication may be prescribed for children as young as 2 years for selected conditions, precautions do apply. Overdosage: If you think you have taken too much of this medicine contact a poison control center or emergency room at once. NOTE: This medicine is only for you. Do not share this medicine with others. What if I miss a dose? It is  important not to miss your dose. Call your care team if you are unable to keep an appointment. If you give yourself the medication, and you miss a dose, talk with your care team. Do not take double or extra doses. What may interact with this medication? Do not take this medication with any of the following: Acitretin This medication may also interact with the following: Aspirin or aspirin-like medications including salicylates Azathioprine Certain antibiotics like chloramphenicol, penicillin, tetracycline Certain medications that treat or prevent blood clots like warfarin, apixaban, dabigatran, and rivaroxaban Certain medications for stomach problems like esomeprazole, omeprazole, pantoprazole Cyclosporine Dapsone Diuretics Folic acid Gold Hydroxychloroquine Live virus vaccines Medications for infection like acyclovir, adefovir, amphotericin B, bacitracin, cidofovir, foscarnet, ganciclovir, gentamicin, pentamidine, vancomycin Mercaptopurine NSAIDs, medications for pain and inflammation, like ibuprofen or naproxen Pamidronate Pemetrexed Penicillamine Phenylbutazone Phenytoin Probenecid Pyrimethamine Retinoids such as isotretinoin and tretinoin Steroid medications like prednisone or cortisone Sulfonamides like sulfasalazine and trimethoprim/sulfamethoxazole Theophylline Zoledronic acid This list may not describe all possible interactions. Give your health care provider a list of all the medicines, herbs, non-prescription drugs, or dietary supplements you use. Also tell them if you smoke, drink alcohol, or use illegal drugs. Some items may interact with your medicine. What should I watch for while using this medication? This medication may make you feel generally unwell. This is not uncommon as chemotherapy can affect healthy  cells as well as cancer cells. Report any side effects. Continue your course of treatment even though you feel ill unless your care team tells you to stop. Your  condition will be monitored carefully while you are receiving this medication. Avoid alcoholic drinks. This medication can cause serious side effects. To reduce the risk, your care team may give you other medications to take before receiving this one. Be sure to follow the directions from your care team. This medication can make you more sensitive to the sun. Keep out of the sun. If you cannot avoid being in the sun, wear protective clothing and use sunscreen. Do not use sun lamps or tanning beds/booths. You may get drowsy or dizzy. Do not drive, use machinery, or do anything that needs mental alertness until you know how this medication affects you. Do not stand or sit up quickly, especially if you are an older patient. This reduces the risk of dizzy or fainting spells. You may need blood work while you are taking this medication. Call your care team for advice if you get a fever, chills or sore throat, or other symptoms of a cold or flu. Do not treat yourself. This medication decreases your body's ability to fight infections. Try to avoid being around people who are sick. This medication may increase your risk to bruise or bleed. Call your care team if you notice any unusual bleeding. Be careful brushing or flossing your teeth or using a toothpick because you may get an infection or bleed more easily. If you have any dental work done, tell your dentist you are receiving this medication Check with your care team if you get an attack of severe diarrhea, nausea and vomiting, or if you sweat a lot. The loss of too much body fluid can make it dangerous for you to take this medication. Talk to your care team about your risk of cancer. You may be more at risk for certain types of cancers if you take this medication. Do not become pregnant while taking this medication or for 6 months after stopping it. Women should inform their care team if they wish to become pregnant or think they might be pregnant. Men  should not father a child while taking this medication and for 3 months after stopping it. There is potential for serious harm to an unborn child. Talk to your care team for more information. Do not breast-feed an infant while taking this medication or for 1 week after stopping it. This medication may make it more difficult to get pregnant or father a child. Talk to your care team if you are concerned about your fertility. What side effects may I notice from receiving this medication? Side effects that you should report to your care team as soon as possible: Allergic reactions-skin rash, itching, hives, swelling of the face, lips, tongue, or throat Blood clot-pain, swelling, or warmth in the leg, shortness of breath, chest pain Dry cough, shortness of breath or trouble breathing Infection-fever, chills, cough, sore throat, wounds that don't heal, pain or trouble when passing urine, general feeling of discomfort or being unwell Kidney injury-decrease in the amount of urine, swelling of the ankles, hands, or feet Liver injury-right upper belly pain, loss of appetite, nausea, light-colored stool, dark yellow or brown urine, yellowing of the skin or eyes, unusual weakness or fatigue Low red blood cell count-unusual weakness or fatigue, dizziness, headache, trouble breathing Redness, blistering, peeling, or loosening of the skin, including inside the mouth Seizures Unusual bruising  or bleeding Side effects that usually do not require medical attention (report to your care team if they continue or are bothersome): Diarrhea Dizziness Hair loss Nausea Pain, redness, or swelling with sores inside the mouth or throat Vomiting This list may not describe all possible side effects. Call your doctor for medical advice about side effects. You may report side effects to FDA at 1-800-FDA-1088. Where should I keep my medication? This medication is given in a hospital or clinic. It will not be stored at  home. NOTE: This sheet is a summary. It may not cover all possible information. If you have questions about this medicine, talk to your doctor, pharmacist, or health care provider.  2022 Elsevier/Gold Standard (2020-08-07 12:51:29)

## 2021-04-11 ENCOUNTER — Other Ambulatory Visit: Payer: Self-pay | Admitting: Physician Assistant

## 2021-04-11 NOTE — Telephone Encounter (Signed)
Dr. Estanislado Pandy discontinued this prescription in April 2022.   Sulindac interacts with methotrexate.  Sulindac can increase blood levels and side effects of MTX.  NSAIDs are not recommended while taking MTX.

## 2021-04-11 NOTE — Telephone Encounter (Signed)
Next Visit: 05/11/2021  Last Visit: 03/30/2021  Last Fill: 11/06/2020  [id number] arthritis of multiple sites with negative rheumatoid factor   Current Dose per office note 03/30/2021: not discussed  Labs: 02/08/2021 Hgb 15.1, Abs Immature Granulocytes 0.08, Total Bilirubin 1.5  Okay to refill Sulindac?

## 2021-04-20 ENCOUNTER — Other Ambulatory Visit: Payer: Self-pay | Admitting: Internal Medicine

## 2021-04-23 ENCOUNTER — Telehealth: Payer: Self-pay | Admitting: Rheumatology

## 2021-04-23 NOTE — Telephone Encounter (Signed)
Patient received MTX in the mail, and would like to set up an appointment to be shown how to do the injection. Please call to schedule.

## 2021-04-24 NOTE — Telephone Encounter (Signed)
Patient scheduled for MTX vial/syringe training on 04/25/21.   Knox Saliva, PharmD, MPH, BCPS Clinical Pharmacist (Rheumatology and Pulmonology)

## 2021-04-25 ENCOUNTER — Ambulatory Visit: Payer: Medicare Other | Admitting: Pharmacist

## 2021-04-25 ENCOUNTER — Other Ambulatory Visit: Payer: Self-pay | Admitting: Rheumatology

## 2021-04-25 ENCOUNTER — Other Ambulatory Visit: Payer: Self-pay

## 2021-04-25 DIAGNOSIS — Z7189 Other specified counseling: Secondary | ICD-10-CM

## 2021-04-25 DIAGNOSIS — M0609 Rheumatoid arthritis without rheumatoid factor, multiple sites: Secondary | ICD-10-CM

## 2021-04-25 DIAGNOSIS — M0630 Rheumatoid nodule, unspecified site: Secondary | ICD-10-CM

## 2021-04-25 DIAGNOSIS — Z79899 Other long term (current) drug therapy: Secondary | ICD-10-CM

## 2021-04-25 LAB — CBC WITH DIFFERENTIAL/PLATELET
Absolute Monocytes: 596 cells/uL (ref 200–950)
Basophils Absolute: 30 cells/uL (ref 0–200)
Basophils Relative: 0.5 %
Eosinophils Absolute: 242 cells/uL (ref 15–500)
Eosinophils Relative: 4.1 %
HCT: 44.2 % (ref 35.0–45.0)
Hemoglobin: 14.6 g/dL (ref 11.7–15.5)
Lymphs Abs: 1186 cells/uL (ref 850–3900)
MCH: 30.8 pg (ref 27.0–33.0)
MCHC: 33 g/dL (ref 32.0–36.0)
MCV: 93.2 fL (ref 80.0–100.0)
MPV: 10.5 fL (ref 7.5–12.5)
Monocytes Relative: 10.1 %
Neutro Abs: 3847 cells/uL (ref 1500–7800)
Neutrophils Relative %: 65.2 %
Platelets: 211 10*3/uL (ref 140–400)
RBC: 4.74 10*6/uL (ref 3.80–5.10)
RDW: 13.1 % (ref 11.0–15.0)
Total Lymphocyte: 20.1 %
WBC: 5.9 10*3/uL (ref 3.8–10.8)

## 2021-04-25 LAB — COMPLETE METABOLIC PANEL WITH GFR
AG Ratio: 1.2 (calc) (ref 1.0–2.5)
ALT: 11 U/L (ref 6–29)
AST: 17 U/L (ref 10–35)
Albumin: 4.2 g/dL (ref 3.6–5.1)
Alkaline phosphatase (APISO): 90 U/L (ref 37–153)
BUN: 10 mg/dL (ref 7–25)
CO2: 31 mmol/L (ref 20–32)
Calcium: 9.7 mg/dL (ref 8.6–10.4)
Chloride: 103 mmol/L (ref 98–110)
Creat: 0.64 mg/dL (ref 0.60–1.00)
Globulin: 3.4 g/dL (calc) (ref 1.9–3.7)
Glucose, Bld: 106 mg/dL — ABNORMAL HIGH (ref 65–99)
Potassium: 3.9 mmol/L (ref 3.5–5.3)
Sodium: 140 mmol/L (ref 135–146)
Total Bilirubin: 1.1 mg/dL (ref 0.2–1.2)
Total Protein: 7.6 g/dL (ref 6.1–8.1)
eGFR: 93 mL/min/{1.73_m2} (ref >=60)

## 2021-04-25 MED ORDER — METHOTREXATE SODIUM CHEMO INJECTION 50 MG/2ML: 10.0000 mg | INTRAMUSCULAR | 2 refills | Status: DC

## 2021-04-25 NOTE — Progress Notes (Signed)
Pharmacy Note  Subjective:   Patient presents to clinic today to receive first dose of injectable methotrexate vial and syringe.  Patient running a fever or have signs/symptoms of infection? No  Patient currently on antibiotics for the treatment of infection? No  Patient have any upcoming invasive procedures/surgeries? No  Objective: CMP     Component Value Date/Time   NA 138 02/08/2021 1532   K 3.9 02/08/2021 1532   CL 103 02/08/2021 1532   CO2 25 02/08/2021 1532   GLUCOSE 98 02/08/2021 1532   BUN 12 02/08/2021 1532   CREATININE 0.62 02/08/2021 1532   CREATININE 0.75 11/06/2020 1346   CALCIUM 10.1 02/08/2021 1532   PROT 7.7 02/08/2021 1532   ALBUMIN 4.6 02/08/2021 1532   AST 18 02/08/2021 1532   ALT 18 02/08/2021 1532   ALKPHOS 71 02/08/2021 1532   BILITOT 1.5 (H) 02/08/2021 1532   GFRNONAA >60 02/08/2021 1532   GFRNONAA 79 11/06/2020 1346   GFRAA 92 11/06/2020 1346    CBC    Component Value Date/Time   WBC 7.9 02/08/2021 1532   RBC 4.77 02/08/2021 1532   HGB 15.1 (H) 02/08/2021 1532   HGB 13.4 09/19/2020 0858   HCT 43.2 02/08/2021 1532   HCT 39.0 09/19/2020 0858   PLT 227 02/08/2021 1532   PLT 221 09/19/2020 0858   MCV 90.6 02/08/2021 1532   MCV 91 09/19/2020 0858   MCH 31.7 02/08/2021 1532   MCHC 35.0 02/08/2021 1532   RDW 13.2 02/08/2021 1532   RDW 13.1 09/19/2020 0858   LYMPHSABS 1.8 02/08/2021 1532   MONOABS 0.8 02/08/2021 1532   EOSABS 0.3 02/08/2021 1532   BASOSABS 0.1 02/08/2021 1532    Baseline Immunosuppressant Therapy Labs TB GOLD Quantiferon TB Gold Latest Ref Rng & Units 11/06/2020  Quantiferon TB Gold Plus NEGATIVE NEGATIVE   Hepatitis Panel   HIV Lab Results  Component Value Date   HIV Non Reactive 07/28/2018   HIV NONREACTIVE 12/19/2016   Immunoglobulins Immunoglobulin Electrophoresis Latest Ref Rng & Units 12/19/2016  IgG 694 - 1,618 mg/dL 841  IgM 48 - 271 mg/dL 370(H)   SPEP Serum Protein Electrophoresis Latest Ref Rng &  Units 02/08/2021  Total Protein 6.5 - 8.1 g/dL 7.7  Albumin 3.8 - 4.8 g/dL -  Alpha-1 0.2 - 0.3 g/dL -  Alpha-2 0.5 - 0.9 g/dL -  Beta Globulin 0.4 - 0.6 g/dL -  Beta 2 0.2 - 0.5 g/dL -  Gamma Globulin 0.8 - 1.7 g/dL -   G6PD No results found for: G6PDH TPMT No results found for: TPMT   Chest x-ray: 02/08/21 - no acute cardiopulmonary disease  Assessment/Plan:  Demonstrated proper injection technique with saline and syringe demo device  Patient able to demonstrate proper injection technique using the teach back method.  Patient self injected in the left thigh with:  Patient tolerated well.  Observed for 15 mins in office for adverse reaction and none noted.   Patient is to return in 1 month for labs and 6-8 weeks for follow-up appointment.  Standing orders placed.   She will continue MTX 10mg  (0.51mL) subcut once weekly with folic acid 1 mg once daily in combination with Humira 40mg  subcut every 14 days. Pharmacy only dispensed 2 single-use vials. We called pharmacy together and confirmed that they billed for 14 days. Rx re-sent for 68ml for single-use vials to cover 4 week supply  She has CBC and CMP drawn today to update Humira labs.  All questions encouraged and  answered.  Instructed patient to call with any further questions or concerns.  Knox Saliva, PharmD, MPH, BCPS Clinical Pharmacist (Rheumatology and Pulmonology)  04/25/2021 9:27 AM

## 2021-04-25 NOTE — Patient Instructions (Addendum)
Continue methotrexate injections every week. Draw up 0.35mL ([place]) Alternate injection sites - stomach and thigh Use a new syringe/needle each week. Safely dispose of used needles as you do with your Humira.  If you have signs or symptoms of an infection or start antibiotics: First, call your PCP for workup of your infection. Hold your medication through the infection, until you complete your antibiotics, and until symptoms resolve if you take the following: Methotrexate  Museum/gallery conservator is the safe disposal of objects that have sharp points or edges. These objects are used to puncture or cut the skin or to give medicines. Sharps include: Needles. Syringes. Lancets or other sharp objects used in surgery. Knowing how to properly dispose of sharps is important. Used sharps can injure you or others and can spread viruses that cause infections. These infections can cause serious health problems. The most common viruses that are spread by sharps include: Hepatitis B virus. Hepatitis C virus. HIV (human immunodeficiency virus). Supplies needed:  Radiation protection practitioner. Travel-size disposal containers, if traveling. Follow these instructions at home: Each city or state may have different rules for sharps disposal. Check with your public works, health department, or waste management department to find out what disposal methods are appropriate in your city. Here are some general guidelines to follow regardless of regulations in your city or state: Proper sharps disposal Drop the sharp in a sharps container. This will protect you from possibly being stuck by the sharp object. Use containers that: Are in line with your city or state guidelines. Are leakproof. Have a tight-fitting lid that cannot be punctured. This ensures that sharps do not come out. Remain upright during use. Are labeled with proper warnings. Do not flush sharps down the toilet. Do not throw sharps  in the recycling container. Do not throw loose sharps in household or public trash cans. If you do not have a sharps container, you may purchase one from a pharmacy or a medical supply company. General guidelines Always label sharps as hazardous waste. Do not break, clip, bend, or recap needles. Keep children and pets away from sharps containers. If you are going to travel, ask your airline or your health care provider how best to dispose of sharps when traveling. After disposal, wash your hands with soap and water for at least 20 seconds. Summary Sharps disposal is the safe disposal of objects that have sharp points or edges. Sharps include needles, syringes, lancets, and other sharp objects used in surgery. Used sharps can injure you or others and spread viruses that cause infections. These infections can cause serious health problems. Check with your public works, health department, or waste management department to find out what disposal methods are appropriate in your city. Use appropriate disposal containers that are labeled, are leakproof, and have tight-fitting lids. This information is not intended to replace advice given to you by your health care provider. Make sure you discuss any questions you have with your health care provider. Document Revised: 07/03/2020 Document Reviewed: 05/23/2020 Elsevier Patient Education  2022 [employer].

## 2021-04-26 ENCOUNTER — Other Ambulatory Visit: Payer: Self-pay

## 2021-04-26 DIAGNOSIS — M457 Ankylosing spondylitis of lumbosacral region: Secondary | ICD-10-CM

## 2021-04-26 MED ORDER — HUMIRA (2 PEN) 40 MG/0.4ML ~~LOC~~ AJKT: 40.0000 mg | AUTO-INJECTOR | SUBCUTANEOUS | 0 refills | Status: DC

## 2021-04-26 NOTE — Telephone Encounter (Signed)
Next Visit: 05/11/2021   Last Visit: 03/30/2021   Last Fill: 12/28/2020  [id number] arthritis of multiple sites with negative rheumatoid factor    Current Dose per office note 03/30/2021:   Labs: 04/25/2021 CBC WNL. Glucose is 106.  Rest of CMP WNL.   Tb Gold: 11/06/2020 Neg   Okay to refill Humira?

## 2021-04-26 NOTE — Progress Notes (Signed)
Office Visit Note  Patient: Kirsten Harper             Date of Birth: 1946-02-01           MRN: [id number]             PCP: Kirsten Carbon, MD Referring: Kirsten Carbon, MD Visit Date: 05/10/2021 Occupation: @GUAROCC @  Subjective:  Pain in multiple joints   History of Present Illness: Kirsten Harper is a 75 y.o. female with history of seronegative rheumatoid arthritis, uveitis, osteoarthritis, and DDD.  She is on humira 40 mg sq injections every 14 days.  She had 1 dose of injectable MTX 0.4 ml on 04/25/2021 while in the office.  She was advised to avoid sulindac while on MTX but she states that sulindac is the only thing that provides any pain relief, so she decided to discontinue MTX.  She presents today having a flare in multiple joints.  Her pain has been most severe in both shoulders especially the right shoulder.  She continues to have pain and swelling in both hands and both wrist joints.  She has difficulty climbing steps due to the severity of pain in both knees.  She has been walking with a cane to assist with ambulation.   Activities of Daily Living:  Patient reports morning stiffness for all day. Patient Reports nocturnal pain.  Difficulty dressing/grooming: Reports Difficulty climbing stairs: Reports Difficulty getting out of chair: Reports Difficulty using hands for taps, buttons, cutlery, and/or writing: Reports  Review of Systems  Constitutional:  Positive for fatigue.  HENT:  Positive for mouth dryness. Negative for mouth sores and nose dryness.   Eyes:  Positive for itching. Negative for pain, redness and dryness.  Respiratory:  Negative for shortness of breath and difficulty breathing.   Cardiovascular:  Positive for palpitations. Negative for chest pain.  Gastrointestinal:  Negative for blood in stool, constipation and diarrhea.  Endocrine: Negative for increased urination.  Genitourinary:  Negative for difficulty urinating.  Musculoskeletal:  Positive for  joint pain, joint pain, joint swelling and morning stiffness. Negative for myalgias, muscle tenderness and myalgias.  Skin:  Negative for color change, rash and redness.  Allergic/Immunologic: Negative for susceptible to infections.  Neurological:  Positive for numbness and memory loss. Negative for dizziness, headaches and weakness.  Hematological:  Negative for bruising/bleeding tendency.  Psychiatric/Behavioral:  Negative for confusion. The patient is nervous/anxious.    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   Spondyloarthropathy HLA-B27 positive 06/06/2016   Iritis 06/06/2016   High risk medication use 06/06/2016   Primary osteoarthritis of both hands 06/06/2016   Smoker 06/06/2016   COPD exacerbation ([place]) 04/10/2016   Anxiety 04/10/2016   Essential hypertension     Past Medical History:  Diagnosis Date   Ankylosing spondylitis (HCC)    Anxiety    Arthritis    Chronic lower back pain    CKD (chronic kidney disease), stage II    "related to the Ankylosing spondylitis" (04/10/2016)   COPD (chronic obstructive pulmonary disease) ([place])  Eye disease    Hypertension    PONV (postoperative nausea and vomiting)    "didn't bother me the last time I had it in ~ 2015"    Family History  Problem Relation Age of Onset   Hypertension Mother    Heart disease Sister    Diabetes Sister    Pulmonary fibrosis Brother    Idiopathic pulmonary fibrosis Brother    Alzheimer's disease Brother    Dementia Brother     Frontotemporal dementia Brother    Past Surgical History:  Procedure Laterality Date   BROW LIFT AND BLEPHAROPLASTY Bilateral ~ 2015   CATARACT EXTRACTION W/ INTRAOCULAR LENS  IMPLANT, BILATERAL Bilateral 2000s   CYST REMOVAL TRUNK  12/2018   MANDIBLE SURGERY  ~ 2000   "jaw had moved to left; broke my jaw in 5 places; put titanium in; stripped muscle from bone; wired jaw shut"   [place]   Social History   Social History Narrative   2 sons--not in area   Lives on property with her mother Kirsten Harper      Has living will   Would want one of her sons, Kirsten Harper, to be Media planner   Requests DNR   No tube feeds   Immunization History  Administered Date(s) Administered   Influenza Inj Mdck Quad Pf 04/16/2018   Influenza, High Dose Seasonal PF 04/09/2019, 02/22/2020   Influenza-Unspecified 04/09/2019   Moderna Sars-Covid-2 Vaccination 09/06/2019, 10/05/2019, 03/19/2020   Pneumococcal Conjugate-13 05/10/2013   Pneumococcal Polysaccharide-23 09/20/2014   Tdap 04/30/2011   Zoster Recombinat (Shingrix) 10/01/2016     Objective: Vital Signs: BP 116/73 (BP Location: Left Arm, Patient Position: Sitting, Cuff Size: Large)   Pulse 76   Ht 5' 7.5" (1.715 m)   Wt 171 lb (77.6 kg)   BMI 26.39 kg/m    Physical Exam Vitals and nursing note reviewed.  Constitutional:      Appearance: She is well-developed.  HENT:     Head: Normocephalic and atraumatic.  Eyes:     Conjunctiva/sclera: Conjunctivae normal.  Pulmonary:     Effort: Pulmonary effort is normal.  Abdominal:     Palpations: Abdomen is soft.  Musculoskeletal:     Cervical back: Normal range of motion.  Skin:    General: Skin is warm and dry.     Capillary Refill: Capillary refill takes less than 2 seconds.  Neurological:     Mental Status: She is alert and oriented to person, place, and time.  Psychiatric:        Behavior: Behavior normal.     Musculoskeletal Exam:  C-spine has slightly limited ROM with lateral rotation.  Painful and limited ROM of both shoulder joints to about 90 degrees. Elbow joints have good ROM with no tenderness.  Thickening of olecranon bursa of left elbow.  Limited ROM of both wrist joints with tenderness and inflammation.  Tenderness and synovitis of several MCP and PIP joints.  Incomplete fist formation bilaterally.  Hip joints have good ROM. Painful ROM of both knee joints with crepitus.  Ankle joints have good ROM with no tenderness or joint swelling.  No tenderness over MTP joints.  Overcrowding of toes noted.   CDAI Exam: CDAI Score: 19.2  Patient Global: 6 mm; Provider Global: 6 mm Swollen: 8 ; Tender: 11  Joint Exam 05/10/2021      Right  Left  Glenohumeral   Tender   Tender  Wrist  Swollen Tender  MCP 2  Swollen Tender  Swollen Tender  MCP 3  Swollen Tender  Swollen Tender  MCP 5  Swollen Tender     PIP 2  Swollen Tender     PIP 4  Swollen Tender     Cervical Spine   Tender        Investigation: No additional findings.  Imaging: No results found.  Recent Labs: Lab Results  Component Value Date   WBC 5.9 04/25/2021   HGB 14.6 04/25/2021   PLT 211 04/25/2021   NA 140 04/25/2021   K 3.9 04/25/2021   CL 103 04/25/2021   CO2 31 04/25/2021   GLUCOSE 106 (H) 04/25/2021   BUN 10 04/25/2021   CREATININE 0.64 04/25/2021   BILITOT 1.1 04/25/2021   ALKPHOS 71 02/08/2021   AST 17 04/25/2021   ALT 11 04/25/2021   PROT 7.6 04/25/2021   ALBUMIN 4.6 02/08/2021   CALCIUM 9.7 04/25/2021   GFRAA 92 11/06/2020   QFTBGOLDPLUS NEGATIVE 11/06/2020    Speciality Comments: Methotrexate-nausea, Arava started March 21, 2020-diarrhea Humira January 22, 2017  Procedures:  Large Joint Inj: R glenohumeral on 05/10/2021 11:37 AM Indications: pain Details: 27 G 1.5 in needle, posterior approach  Arthrogram: No  Medications: 1.5 mL lidocaine 1 %; 40 mg triamcinolone acetonide 40 MG/ML Aspirate: 0 mL Outcome:  tolerated well, no immediate complications Procedure, treatment alternatives, risks and benefits explained, specific risks discussed. Consent was given by the patient. Immediately prior to procedure a time out was called to verify the correct patient, procedure, equipment, support staff and site/side marked as required. Patient was prepped and draped in the usual sterile fashion.    Allergies: Amoxapine and related, Seroquel [quetiapine], and Other     Assessment / Plan:     Visit Diagnoses: Rheumatoid arthritis of multiple sites with negative rheumatoid factor (HCC) - Erosive changes, RF-, anti-CCP-, 14-3-3 eta-: She presents today having a flare in multiple joints.  Her pain has been a severe in both shoulder joints especially the right shoulder as well as both hands and both wrist joints.  She has tenderness and synovitis of the right wrist and several MCP and PIP joints as described above.  She was unable to make a complete fist on examination today.  X-rays of the right shoulder were obtained which were unremarkable.  The right glenohumeral joint was injected with cortisone and the procedure note was completed above.  She is currently on Humira 40 mg subcutaneous injections every 14 days and has not missed any doses recently.  She was started on injectable methotrexate 0.4 mL on 04/25/2021 while in the office but has not had an injection since then.  She was advised to avoid the use of sulindac while on MTX but she feels that sulindac has been the only medication that manages her pain so instead she discontinued methotrexate.  Discussed the importance of methotrexate in combination with Humira to slow the progression of rheumatoid arthritis as well as help prevent flares of uveitis.  She is in agreement to restart on methotrexate and self-administered her dose today in the office.  She will return in 2 weeks for updated lab work.  For the current flare a prednisone taper starting at 20 mg tapering by 5  mg every week was sent to the pharmacy.  She will follow-up in the office in 6-8 weeks to assess her response to combination therapy.  Educated patient on how to use a vial and syringe and reviewed injection technique with  patient.  Patient was able to demonstrate proper technique for injections using vial and syringe.  Provided patient educational material regarding injection technique and storage of methotrexate.    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   High risk medication use - Humira 40 mg sq injections every 14 days, methotrexate 0.45ml once weekly. D/c Arava due to diarrhea. TB gold negative on 11/06/20.  CBC and CMP updated on 04/25/21 and were WNL.  She will be due to update lab work in January and every 3 months.  Standing orders for CBC and CMP remain in place.   Discussed the importance of holding Humira and MTX if she develops signs or symptoms of an infection and to resume once the infection has completely cleared.   Discussed the importance of yearly skin examinations while on Humira due to the increased risk for non-melanoma skin cancer.    Uveitis - Dr. Manuella Ghazi.  Recurrent flares in the past. No conjunctival injection noted on exam. She is not experiencing any eye pain or photophobia.  She will remain on humira and will be restarting on MTX as discussed above.   Numbness of right thumb: Resolved   Chronic right shoulder pain - She presents today with significant pain in both shoulder joints, right > left.  Her discomfort has progressively been worsening over the past 2-3 weeks.  She has been taking sulindac for pain relief.  X-rays of the right shoulder were obtained today.  She requested a right shoulder cortisone injection.  She tolerated the procedure well.  Aftercare was discussed.  Procedure note completed above.  Advised the patient to notify us if her symptoms persist or worsen. Plan: XR Shoulder Right  Primary osteoarthritis of both hands: She  has PIP and DIP thickening.  Incomplete fist formation bilaterally.  She is currently having a RA flare.  A prednisone taper was sent to the pharmacy.   Primary osteoarthritis of both knees: Chronic pain. She has difficulty walking prolonged distances as well as climbing steps due to the severity of pain. Painful ROM of both knees with crepitus noted on examination. She has been using a cane to assist with ambulation.   DDD (degenerative disc disease), lumbar - X-rays of her lumbar spine from August 26, 2019 were consistent with degenerative disc disease.  HLA B 27 Positive   Other medical conditions are listed as follows:   History of hypertension: BP was 116/73 today in the office. Discussed the importance of monitoring her blood pressure following the cortisone injection today.   History of COPD  History of anxiety  Former smoker    Orders: Orders Placed This Encounter  Procedures   Large Joint Inj: R glenohumeral   XR Shoulder Right   Meds ordered this encounter  Medications   predniSONE (DELTASONE) 5 MG tablet    Sig: Take 4 tablets by mouth daily x1 wk, 3 tablets daily x1wk, 2 tablets daily x1wk, 1 tablet daily x1wk.    Dispense:  70 tablet    Refill:  0     Follow-Up Instructions: Return in about 6 weeks (around 06/21/2021) for Rheumatoid arthritis, Uveitis, OA, DDD.   Ofilia Neas, PA-C  Note - This record has been created using Dragon software.  Chart creation errors have been sought, but may not always  have been located. Such creation errors do not reflect on  the standard of medical care.

## 2021-04-26 NOTE — Telephone Encounter (Signed)
Patient called stating she just got off the phone regarding the results of her labwork and forgot to ask for a prescription refill of her Humira.

## 2021-04-27 ENCOUNTER — Other Ambulatory Visit: Payer: Self-pay | Admitting: Physician Assistant

## 2021-04-30 NOTE — Telephone Encounter (Signed)
Next Visit: 05/11/2021   Last Visit: 03/30/2021  Current Dose per office note 03/30/2021: not discussed   Labs: 04/25/2021 CBC WNL. Glucose is 106.  Rest of CMP WNL.   Okay to refill Sulindac?

## 2021-04-30 NOTE — Telephone Encounter (Signed)
Advised patient that per Dr. Estanislado Pandy, Her labs have been stable.  Please make patient aware that there is increased risk of liver and kidney toxicity with the combination of methotrexate and sulindac.  There is increased risk of GI bleed with the combination of sulindac and antidepressant. Patient verbalized understanding.

## 2021-04-30 NOTE — Telephone Encounter (Signed)
Her labs have been stable.  Please make patient aware that there is increased risk of liver and kidney toxicity with the combination of methotrexate and sulindac.  There is increased risk of GI bleed with the combination of sulindac and antidepressant.

## 2021-05-09 NOTE — Patient Instructions (Signed)
Standing Labs We placed an order today for your standing lab work.   Please have your standing labs drawn in January and every 3 months   If possible, please have your labs drawn 2 weeks prior to your appointment so that the provider can discuss your results at your appointment.  Please note that you may see your imaging and lab results in MyChart before we have reviewed them. We may be awaiting multiple results to interpret others before contacting you. Please allow our office up to 72 hours to thoroughly review all of the results before contacting the office for clarification of your results.  We have open lab daily: Monday through Thursday from 1:30-4:30 PM and Friday from 1:30-4:00 PM at the office of Dr. Shaili Deveshwar, [facility].   Please be advised, all patients with office appointments requiring lab work will take precedent over walk-in lab work.  If possible, please come for your lab work on Monday and Friday afternoons, as you may experience shorter wait times. The office is located at [address], [address], [place], [place] [address] No appointment is necessary.   Labs are drawn by [facility]. Please bring your co-pay at the time of your lab draw.  You may receive a bill from [facility] for your lab work.  If you wish to have your labs drawn at another location, please call the office 24 hours in advance to send orders.  If you have any questions regarding directions or hours of operation,  please call [phone].   As a reminder, please drink plenty of water prior to coming for your lab work. Thanks!  

## 2021-05-10 ENCOUNTER — Other Ambulatory Visit: Payer: Self-pay

## 2021-05-10 ENCOUNTER — Ambulatory Visit: Payer: Self-pay

## 2021-05-10 ENCOUNTER — Encounter: Payer: Self-pay | Admitting: Physician Assistant

## 2021-05-10 ENCOUNTER — Ambulatory Visit (INDEPENDENT_AMBULATORY_CARE_PROVIDER_SITE_OTHER): Payer: Medicare Other | Admitting: Physician Assistant

## 2021-05-10 VITALS — BP 116/73 | HR 76 | Ht 67.5 in | Wt 171.0 lb

## 2021-05-10 DIAGNOSIS — H209 Unspecified iridocyclitis: Secondary | ICD-10-CM

## 2021-05-10 DIAGNOSIS — Z8659 Personal history of other mental and behavioral disorders: Secondary | ICD-10-CM

## 2021-05-10 DIAGNOSIS — M170 Bilateral primary osteoarthritis of knee: Secondary | ICD-10-CM

## 2021-05-10 DIAGNOSIS — G8929 Other chronic pain: Secondary | ICD-10-CM | POA: Diagnosis not present

## 2021-05-10 DIAGNOSIS — M25511 Pain in right shoulder: Secondary | ICD-10-CM

## 2021-05-10 DIAGNOSIS — Z8679 Personal history of other diseases of the circulatory system: Secondary | ICD-10-CM

## 2021-05-10 DIAGNOSIS — Z8709 Personal history of other diseases of the respiratory system: Secondary | ICD-10-CM

## 2021-05-10 DIAGNOSIS — M0609 Rheumatoid arthritis without rheumatoid factor, multiple sites: Secondary | ICD-10-CM

## 2021-05-10 DIAGNOSIS — M47819 Spondylosis without myelopathy or radiculopathy, site unspecified: Secondary | ICD-10-CM

## 2021-05-10 DIAGNOSIS — M19041 Primary osteoarthritis, right hand: Secondary | ICD-10-CM

## 2021-05-10 DIAGNOSIS — M0630 Rheumatoid nodule, unspecified site: Secondary | ICD-10-CM

## 2021-05-10 DIAGNOSIS — Z87891 Personal history of nicotine dependence: Secondary | ICD-10-CM

## 2021-05-10 DIAGNOSIS — Z79899 Other long term (current) drug therapy: Secondary | ICD-10-CM

## 2021-05-10 DIAGNOSIS — R200 Anesthesia of skin: Secondary | ICD-10-CM

## 2021-05-10 DIAGNOSIS — M51369 Other intervertebral disc degeneration, lumbar region without mention of lumbar back pain or lower extremity pain: Secondary | ICD-10-CM

## 2021-05-10 DIAGNOSIS — M19042 Primary osteoarthritis, left hand: Secondary | ICD-10-CM

## 2021-05-10 DIAGNOSIS — M5136 Other intervertebral disc degeneration, lumbar region: Secondary | ICD-10-CM

## 2021-05-10 MED ORDER — LIDOCAINE HCL 1 % IJ SOLN
1.5000 mL | INTRAMUSCULAR | Status: AC | PRN
  Administered 2021-05-10: 1.5 mL

## 2021-05-10 MED ORDER — PREDNISONE 5 MG PO TABS: ORAL_TABLET | ORAL | 0 refills | Status: DC

## 2021-05-10 MED ORDER — TRIAMCINOLONE ACETONIDE 40 MG/ML IJ SUSP
40.0000 mg | INTRAMUSCULAR | Status: AC | PRN
Start: 2021-05-10 — End: 2021-05-10
  Administered 2021-05-10: 40 mg via INTRA_ARTICULAR

## 2021-05-11 ENCOUNTER — Ambulatory Visit: Payer: Medicare Other | Admitting: Physician Assistant

## 2021-05-13 ENCOUNTER — Other Ambulatory Visit: Payer: Self-pay | Admitting: Internal Medicine

## 2021-05-23 ENCOUNTER — Telehealth: Payer: Self-pay | Admitting: *Deleted

## 2021-05-23 NOTE — Telephone Encounter (Signed)
Patient contacted the office stating she has been having trouble taking a deep breath. Patient states she the feeling like it is shortness of breath. Patient states she is also having pain in her ribs towards her back. Patient states this has been going on for about 2 weeks since her brother came to visit. Patient she is under a lot of stress. Patient is currently on Humira and MTX. She is also on a Prednisone taper. Patient states she also thinks she has mites. Spoke with Lovena Le and she advised to have patient be seen in the emergency room since she is having trouble breathing. Patient advised to be evaluated in the emergency room.

## 2021-05-31 ENCOUNTER — Other Ambulatory Visit: Payer: Self-pay | Admitting: Internal Medicine

## 2021-06-14 NOTE — Progress Notes (Signed)
Office Visit Note  Patient: Kirsten Harper             Date of Birth: 1946-04-03           MRN: [id number]             PCP: Venia Carbon, MD Referring: Venia Carbon, MD Visit Date: 06/27/2021 Occupation: @GUAROCC @  Subjective:  Stiffness in multiple joints.   History of Present Illness: Kirsten Harper is a 75 y.o. female with a history of rheumatoid arthritis and osteoarthritis.  She states she has been taking Humira and methotrexate injections on a regular basis.  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  According to patient she was visiting her friend 4 days ago and her lights were off.  She tripped and fell forward.  She did not injure anything.  Her nose was sore after the fall.  She denies any history of loss of consciousness.  She states her joints get very stiff after sitting.  She has not noticed any joint swelling.  Activities of Daily Living:  Patient reports morning stiffness for 1-2 hours.   Patient Reports nocturnal pain.  Difficulty dressing/grooming: Reports Difficulty climbing stairs: Reports Difficulty getting out of chair: Reports Difficulty using hands for taps, buttons, cutlery, and/or writing: Reports  Review of Systems  Constitutional:  Positive for fatigue. Negative for night sweats, weight gain and weight loss.  HENT:  Negative for mouth sores, trouble swallowing, trouble swallowing, mouth dryness and nose dryness.   Eyes:  Positive for itching. Negative for pain, redness, visual disturbance and dryness.  Respiratory:  Negative for cough, shortness of breath and difficulty breathing.   Cardiovascular:  Negative for chest pain, palpitations, hypertension, irregular heartbeat and swelling in legs/feet.  Gastrointestinal:  Negative for blood in stool, constipation and diarrhea.  Endocrine: Negative for increased urination.  Genitourinary:  Negative for difficulty urinating and vaginal dryness.  Musculoskeletal:  Positive  for joint pain, joint pain, joint swelling and morning stiffness. Negative for myalgias, muscle weakness, muscle tenderness and myalgias.  Skin:  Negative for color change, rash, hair loss, redness, skin tightness, ulcers and sensitivity to sunlight.  Allergic/Immunologic: Negative for susceptible to infections.  Neurological:  Positive for numbness. Negative for dizziness, headaches, memory loss, night sweats and weakness.  Hematological:  Negative for bruising/bleeding tendency and swollen glands.  Psychiatric/Behavioral:  Negative for depressed mood, confusion and sleep disturbance. The patient is not nervous/anxious.    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   Spondyloarthropathy HLA-B27 positive 06/06/2016   Iritis 06/06/2016   High risk medication use 06/06/2016   Primary osteoarthritis of both hands 06/06/2016   Smoker 06/06/2016   COPD exacerbation ([place]) 04/10/2016   Anxiety 04/10/2016   Essential hypertension     Past Medical History:  Diagnosis Date   Ankylosing spondylitis (HCC)    Anxiety    Arthritis    Chronic lower back pain    CKD (chronic kidney disease), stage II    "related to the  Ankylosing spondylitis" (04/10/2016)   COPD (chronic obstructive pulmonary disease) ([place])    Eye disease    Hypertension    PONV (postoperative nausea and vomiting)    "didn't bother me the last time I had it in ~ 2015"    Family History  Problem Relation Age of Onset   Hypertension  Mother    Heart disease Sister    Diabetes Sister    Pulmonary fibrosis Brother    Idiopathic pulmonary fibrosis Brother    Alzheimer's disease Brother    Dementia Brother    Frontotemporal dementia Brother    Past Surgical History:  Procedure Laterality Date   BROW LIFT AND BLEPHAROPLASTY Bilateral ~ 2015   CATARACT EXTRACTION W/ INTRAOCULAR LENS  IMPLANT, BILATERAL Bilateral 2000s   CYST REMOVAL TRUNK  12/2018   MANDIBLE SURGERY  ~ 2000   "jaw had moved to left; broke my jaw in 5 places; put titanium in; stripped muscle from bone; wired jaw shut"   [place]   Social History   Social History Narrative   2 sons--not in area   Lives on property with her mother [name]      Has living will   Would want one of her sons, [name], to be Media planner   Requests DNR   No tube feeds   Immunization History  Administered Date(s) Administered   Influenza Inj Mdck Quad Pf 04/16/2018   Influenza, High Dose Seasonal PF 04/09/2019, 02/22/2020   Influenza-Unspecified 04/09/2019, 06/27/2021   Moderna Sars-Covid-2 Vaccination 09/06/2019, 10/05/2019, 03/19/2020   Pneumococcal Conjugate-13 05/10/2013   Pneumococcal Polysaccharide-23 09/20/2014   Tdap 04/30/2011   Zoster Recombinat (Shingrix) 10/01/2016   Zoster, Live 12/27/2016, 03/03/2017     Objective: Vital Signs: BP 123/69 (BP Location: Left Arm, Patient Position: Sitting, Cuff Size: Normal)   Pulse 89   Ht 5' 7.5" (1.715 m)   Wt 165 lb 3.2 oz (74.9 kg)   BMI 25.49 kg/m    Physical Exam Vitals and nursing note reviewed.  Constitutional:      Appearance: She is well-developed.  HENT:     Head: Normocephalic and atraumatic.  Eyes:     Conjunctiva/sclera: Conjunctivae normal.  Cardiovascular:     Rate and Rhythm: Normal rate and regular rhythm.     Heart sounds: Normal heart sounds.  Pulmonary:     Effort: Pulmonary effort is normal.     Breath sounds: Normal breath sounds.   Abdominal:     General: Bowel sounds are normal.     Palpations: Abdomen is soft.  Musculoskeletal:     Cervical back: Normal range of motion.  Lymphadenopathy:     Cervical: No cervical adenopathy.  Skin:    General: Skin is warm and dry.     Capillary Refill: Capillary refill takes less than 2 seconds.  Neurological:     Mental Status: She is alert and oriented to person, place, and time.  Psychiatric:        Behavior: Behavior normal.     Musculoskeletal Exam: C-spine was in good range of motion.  Shoulder joints with good range of motion.  Elbow joints, wrist joints, MCPs PIPs and DIPs with good range of motion.  She had thickening of bilateral second MCP joints, palpable PIP and DIP joints with no synovitis.  Hip joints and knee joints with good range of motion.  There is no tenderness or swelling over ankle joints or MTPs.  CDAI Exam:  CDAI Score: 0.6  Patient Global: 3 mm; Provider Global: 3 mm Swollen: 0 ; Tender: 0  Joint Exam 06/27/2021   No joint exam has been documented for this visit   There is currently no information documented on the homunculus. Go to the Rheumatology activity and complete the homunculus joint exam.  Investigation: No additional findings.  Imaging: No results found.  Recent Labs: Lab Results  Component Value Date   WBC 5.9 04/25/2021   HGB 14.6 04/25/2021   PLT 211 04/25/2021   NA 140 04/25/2021   K 3.9 04/25/2021   CL 103 04/25/2021   CO2 31 04/25/2021   GLUCOSE 106 (H) 04/25/2021   BUN 10 04/25/2021   CREATININE 0.64 04/25/2021   BILITOT 1.1 04/25/2021   ALKPHOS 71 02/08/2021   AST 17 04/25/2021   ALT 11 04/25/2021   PROT 7.6 04/25/2021   ALBUMIN 4.6 02/08/2021   CALCIUM 9.7 04/25/2021   GFRAA 92 11/06/2020   QFTBGOLDPLUS NEGATIVE 11/06/2020    Speciality Comments: Methotrexate-nausea, Arava started March 21, 2020-diarrhea Humira January 22, 2017  Procedures:  No procedures performed Allergies: Amoxapine and related,  Seroquel [quetiapine], and Other   Assessment / Plan:     Visit Diagnoses: Rheumatoid arthritis of multiple sites with negative rheumatoid factor ([place]) - Erosive changes, RF-, anti-CCP-, 14-3-3 eta-: She had no synovitis on the examination.  She has been taking Humira and methotrexate on a regular basis.  Rheumatoid nodulosis (HCC) - Unchanged on extensor surface of both elbows.   High risk medication use - Humira 40 mg sq injections every 14 days, methotrexate 0.38ml once weekly. D/c Arava due to diarrhea.  Labs from April 25, 2021 were reviewed CBC and CMP were within normal limits.  TB gold was negative on September 21, 2020.  She has been advised to get labs in January and every 3 months to monitor for drug toxicity.  She was also advised to hold medications in case she develops an infection and resume after the infection resolves.  Annual skin examination was advised while she is taking Humira to screen for skin cancer.  Information about immunization was also placed in the AVS.  Uveitis -she is followed by Dr. Manuella Ghazi.  Patient states she still gets frequent uveitis flares.  She had no conjunctival injection on my examination today.  Numbness of right thumb - Resolved   Chronic right shoulder pain-improved after the cortisone injection.  Primary osteoarthritis of both hands-she has bilateral PIP and DIP thickening with no synovitis.  Primary osteoarthritis of both knees-she continues to have a stiffness in her knee joints.  DDD (degenerative disc disease), lumbar - X-rays of her lumbar spine from August 26, 2019 were consistent with degenerative disc disease.  Core strengthening exercises were discussed.  Other medical problems are listed as follows:  HLA B 27 Positive   History of COPD  History of hypertension-blood pressure was normal today.  History of anxiety  History of recent fall-patient states she tripped in her friend's home as the lights were off.  She did not have any  major injuries.  She fell on her face and her nose was sore for a few days.  Former smoker  Orders: No orders of the defined types were placed in this encounter.  No orders of the defined types were placed in this encounter.    Follow-Up Instructions: Return in about 5 months (around 11/25/2021) for Rheumatoid arthritis.   Bo Merino, MD  Note - This record has been created  using Editor, commissioning.  Chart creation errors have been sought, but may not always  have been located. Such creation errors do not reflect on  the standard of medical care.

## 2021-06-22 ENCOUNTER — Other Ambulatory Visit: Payer: Self-pay | Admitting: Internal Medicine

## 2021-06-27 ENCOUNTER — Ambulatory Visit (INDEPENDENT_AMBULATORY_CARE_PROVIDER_SITE_OTHER): Payer: Medicare Other | Admitting: Internal Medicine

## 2021-06-27 ENCOUNTER — Encounter: Payer: Self-pay | Admitting: Internal Medicine

## 2021-06-27 ENCOUNTER — Other Ambulatory Visit: Payer: Self-pay

## 2021-06-27 ENCOUNTER — Ambulatory Visit (INDEPENDENT_AMBULATORY_CARE_PROVIDER_SITE_OTHER): Payer: Medicare Other | Admitting: Rheumatology

## 2021-06-27 ENCOUNTER — Encounter: Payer: Self-pay | Admitting: Rheumatology

## 2021-06-27 VITALS — BP 138/76 | HR 80 | Temp 97.9°F | Ht 67.5 in | Wt 163.0 lb

## 2021-06-27 VITALS — BP 123/69 | HR 89 | Ht 67.5 in | Wt 165.2 lb

## 2021-06-27 DIAGNOSIS — I10 Essential (primary) hypertension: Secondary | ICD-10-CM

## 2021-06-27 DIAGNOSIS — Z9181 History of falling: Secondary | ICD-10-CM

## 2021-06-27 DIAGNOSIS — M0630 Rheumatoid nodule, unspecified site: Secondary | ICD-10-CM | POA: Diagnosis not present

## 2021-06-27 DIAGNOSIS — R200 Anesthesia of skin: Secondary | ICD-10-CM

## 2021-06-27 DIAGNOSIS — H209 Unspecified iridocyclitis: Secondary | ICD-10-CM

## 2021-06-27 DIAGNOSIS — Z79899 Other long term (current) drug therapy: Secondary | ICD-10-CM | POA: Diagnosis not present

## 2021-06-27 DIAGNOSIS — L309 Dermatitis, unspecified: Secondary | ICD-10-CM | POA: Diagnosis not present

## 2021-06-27 DIAGNOSIS — Z23 Encounter for immunization: Secondary | ICD-10-CM

## 2021-06-27 DIAGNOSIS — M170 Bilateral primary osteoarthritis of knee: Secondary | ICD-10-CM

## 2021-06-27 DIAGNOSIS — Z8679 Personal history of other diseases of the circulatory system: Secondary | ICD-10-CM

## 2021-06-27 DIAGNOSIS — G8929 Other chronic pain: Secondary | ICD-10-CM

## 2021-06-27 DIAGNOSIS — M47819 Spondylosis without myelopathy or radiculopathy, site unspecified: Secondary | ICD-10-CM

## 2021-06-27 DIAGNOSIS — M0609 Rheumatoid arthritis without rheumatoid factor, multiple sites: Secondary | ICD-10-CM

## 2021-06-27 DIAGNOSIS — Z8659 Personal history of other mental and behavioral disorders: Secondary | ICD-10-CM

## 2021-06-27 DIAGNOSIS — Z8709 Personal history of other diseases of the respiratory system: Secondary | ICD-10-CM

## 2021-06-27 DIAGNOSIS — M19041 Primary osteoarthritis, right hand: Secondary | ICD-10-CM

## 2021-06-27 DIAGNOSIS — Z87891 Personal history of nicotine dependence: Secondary | ICD-10-CM

## 2021-06-27 DIAGNOSIS — M19042 Primary osteoarthritis, left hand: Secondary | ICD-10-CM

## 2021-06-27 DIAGNOSIS — M5136 Other intervertebral disc degeneration, lumbar region: Secondary | ICD-10-CM

## 2021-06-27 MED ORDER — AMLODIPINE BESYLATE 10 MG PO TABS: 10.0000 mg | ORAL_TABLET | Freq: Every day | ORAL | 3 refills | Status: AC

## 2021-06-27 MED ORDER — TRIAMCINOLONE ACETONIDE 0.1 % EX CREA: 1.0000 "application " | TOPICAL_CREAM | Freq: Two times a day (BID) | CUTANEOUS | 1 refills | Status: DC | PRN

## 2021-06-27 NOTE — Progress Notes (Signed)
Subjective:    Patient ID: Kirsten Harper, female    DOB: 08-Jul-1946, 75 y.o.   MRN: [id number]  HPI Here due to rash and concerns about her BP  Discussed last visit---she had just had bad news by phone as I walked in Stress with mom---won't leave her home or even have it cleaned Refusing to sell house (backed out) She is living over the garage in apartment--thinks there is something there that was biting her Now moved into the house  Spots on ankles--but also forehead and other spots Did see dermatologist--didn't think it was bedbugs Told her to take zyrtec and benedryl Dry skin--told to use cetaphil  Has doubled her amlodipine BP elevated at times--like at Dr Patrecia Pour (something/99) Has been taking the higher dose for about a year  Current Outpatient Medications on File Prior to Visit  Medication Sig Dispense Refill   Adalimumab (HUMIRA PEN) 40 MG/0.4ML PNKT Inject 40 mg into the skin every 14 (fourteen) days. 3 each 0   albuterol (VENTOLIN HFA) 108 (90 Base) MCG/ACT inhaler INHALE 1 TO 2 PUFFS INTO THE LUNGS EVERY 6 HOURS AS NEEDED 18 each 0   ALPRAZolam (XANAX) 0.5 MG tablet Take 0.5 mg by mouth 2 (two) times daily.     amLODipine (NORVASC) 5 MG tablet Take 1 tablet (5 mg total) by mouth daily. (Patient taking differently: Take 5 mg by mouth daily. Pt states she has been taking 2 a day) 90 tablet 3   dorzolamide-timolol (COSOPT) 22.3-6.8 MG/ML ophthalmic solution Place 1 drop into both eyes 2 (two) times daily.      escitalopram (LEXAPRO) 10 MG tablet Take 10 mg by mouth daily.     fluticasone (FLONASE) 50 MCG/ACT nasal spray Place into both nostrils.     folic acid (FOLVITE) 1 MG tablet Take 1 tablet (1 mg total) by mouth daily. 90 tablet 0   losartan (COZAAR) 100 MG tablet TAKE 1 TABLET BY MOUTH EVERY DAY (Patient taking differently: Pt states she is taking 1.5 to 2 a day per self) 90 tablet 3   LOTEMAX 0.5 % ophthalmic suspension Place 1 drop into both eyes 2 (two) times  daily.      methotrexate 50 MG/2ML injection Inject 0.4 mLs (10 mg total) into the skin once a week. 8 mL 2   sulindac (CLINORIL) 150 MG tablet TAKE 1 TABLET BY MOUTH TWICE A DAY AS NEEDED 60 tablet 2   TUBERCULIN SYR 1CC/27GX1/2" (B-D TB SYRINGE 1CC/27GX1/2") 27G X 1/2" 1 ML MISC 12 Syringes by Does not apply route once a week. 12 each 3   No current facility-administered medications on file prior to visit.    Allergies  Allergen Reactions   Amoxapine And Related Other (See Comments)    (Asendin- "Tricyclic antidepressant") Patient reports it did not work for her   Seroquel [Quetiapine] Other (See Comments)    Worsened restless legs   Other Palpitations and Other (See Comments)    Psych med (name not recalled by the patient, but it "starts with a Z")- Worsened restless legs, also (NOT Zyprexa; I asked)    Past Medical History:  Diagnosis Date   Ankylosing spondylitis ([place])    Anxiety    Arthritis    Chronic lower back pain    CKD (chronic kidney disease), stage II    "related to the Ankylosing spondylitis" (04/10/2016)   COPD (chronic obstructive pulmonary disease) ([place])    Eye disease    Hypertension    PONV (postoperative  nausea and vomiting)    "didn't bother me the last time I had it in ~ 2015"    Past Surgical History:  Procedure Laterality Date   BROW LIFT AND BLEPHAROPLASTY Bilateral ~ 2015   CATARACT EXTRACTION W/ INTRAOCULAR LENS  IMPLANT, BILATERAL Bilateral 2000s   CYST REMOVAL TRUNK  12/2018   MANDIBLE SURGERY  ~ 2000   "jaw had moved to left; broke my jaw in 5 places; put titanium in; stripped muscle from bone; wired jaw shut"   [place]    Family History  Problem Relation Age of Onset   Hypertension Mother    Heart disease Sister    Diabetes Sister    Pulmonary fibrosis Brother    Idiopathic pulmonary fibrosis Brother    Alzheimer's disease Brother    Dementia Brother    Frontotemporal dementia Brother      Social History   Socioeconomic History   Marital status: Divorced    Spouse name: Not on file   Number of children: 2   Years of education: Not on file   Highest education level: Not on file  Occupational History   Occupation: Sales promotion account executive for [employer]    Comment: Retired  Tobacco Use   Smoking status: Every Day    Packs/day: 1.00    Years: 51.00    Pack years: 51.00    Types: Cigarettes   Smokeless tobacco: Never  Vaping Use   Vaping Use: Never used  Substance and Sexual Activity   Alcohol use: Yes    Alcohol/week: 0.0 standard drinks    Comment: occ   Drug use: No   Sexual activity: Never  Other Topics Concern   Not on file  Social History Narrative   2 sons--not in area   Lives on property with her mother [name]      Has living will   Would want one of her sons, [name], to be Media planner   Requests DNR   No tube feeds   Social Determinants of Health   Financial Resource Strain: Not on file  Food Insecurity: Not on file  Transportation Needs: Not on file  Physical Activity: Not on file  Stress: Not on file  Social Connections: Not on file  Intimate Partner Violence: Not on file   Review of Systems No edema     Objective:   Physical Exam Constitutional:      Appearance: Normal appearance.  Skin:    Comments: Non specific excoriations on medial ankles/feet Not much else specific seen  Neurological:     Mental Status: She is alert.           Assessment & Plan:

## 2021-06-27 NOTE — Assessment & Plan Note (Signed)
BP Readings from Last 3 Encounters:  06/27/21 138/76  05/10/21 116/73  03/30/21 116/76   Has been taking the higher dose of amlodipine--so will change the Rx Also on losartan

## 2021-06-27 NOTE — Patient Instructions (Addendum)
Standing Labs We placed an order today for your standing lab work.   Please have your standing labs drawn in January and every 3 months  If possible, please have your labs drawn 2 weeks prior to your appointment so that the provider can discuss your results at your appointment.  Please note that you may see your imaging and lab results in [place] before we have reviewed them. We may be awaiting multiple results to interpret others before contacting you. Please allow our office up to 72 hours to thoroughly review all of the results before contacting the office for clarification of your results.  We have open lab daily: Monday through Thursday from 1:30-4:30 PM and Friday from 1:30-4:00 PM at the office of Dr. Bo Merino, [facility].   Please be advised, all patients with office appointments requiring lab work will take precedent over walk-in lab work.  If possible, please come for your lab work on Monday and Friday afternoons, as you may experience shorter wait times. The office is located at [address] [place], [place], [place] [address] No appointment is necessary.   Labs are drawn by [facility]. Please bring your co-pay at the time of your lab draw.  You may receive a bill from [facility] for your lab work.  If you wish to have your labs drawn at another location, please call the office 24 hours in advance to send orders.  If you have any questions regarding directions or hours of operation,  please call [phone].   As a reminder, please drink plenty of water prior to coming for your lab work. Thanks!   Vaccines You are taking a medication(s) that can suppress your immune system.  The following immunizations are recommended: Flu annually Covid-19  Td/Tdap (tetanus, diphtheria, pertussis) every 10 years Pneumonia (Prevnar 15 then Pneumovax 23 at least 1 year apart.  Alternatively, can take Prevnar 20 without needing additional dose) Shingrix: 2 doses from 4 weeks  to 6 months apart  Please check with your PCP to make sure you are up to date.   If you have signs or symptoms of an infection or start antibiotics: First, call your PCP for workup of your infection. Hold your medication through the infection, until you complete your antibiotics, and until symptoms resolve if you take the following: Injectable medication (Actemra, Benlysta, Cimzia, Cosentyx, Enbrel, Humira, Kevzara, Orencia, Remicade, Simponi, Stelara, Taltz, Tremfya) Methotrexate Leflunomide (Arava) Mycophenolate (Cellcept) Roma Kayser, or Rinvoq   Please get an annual skin examination by dermatologist to screen for skin cancer while you are on Humira

## 2021-06-27 NOTE — Addendum Note (Signed)
Addended by: Pilar Grammes on: 06/27/2021 03:57 PM   Modules accepted: Orders

## 2021-06-27 NOTE — Assessment & Plan Note (Signed)
Non specific Will give rx for triamcinolone for prn use

## 2021-06-28 ENCOUNTER — Telehealth: Payer: Self-pay | Admitting: Internal Medicine

## 2021-06-28 NOTE — Telephone Encounter (Signed)
Pt called stating that medication triamcinolone cream (KENALOG) 0.1 % does not work. Pt is asking if Dr Silvio Pate could prescribe something else. Please advise.

## 2021-06-29 MED ORDER — BETAMETHASONE DIPROPIONATE AUG 0.05 % EX OINT: TOPICAL_OINTMENT | Freq: Two times a day (BID) | CUTANEOUS | 0 refills | Status: DC

## 2021-06-29 NOTE — Addendum Note (Signed)
Addended by: Viviana Simpler I on: 06/29/2021 12:03 PM   Modules accepted: Orders

## 2021-06-29 NOTE — Telephone Encounter (Signed)
Left message on VM per DPR. 

## 2021-07-22 ENCOUNTER — Other Ambulatory Visit: Payer: Self-pay | Admitting: Internal Medicine

## 2021-07-24 ENCOUNTER — Other Ambulatory Visit: Payer: Self-pay | Admitting: Rheumatology

## 2021-07-24 DIAGNOSIS — M0630 Rheumatoid nodule, unspecified site: Secondary | ICD-10-CM

## 2021-07-24 DIAGNOSIS — M0609 Rheumatoid arthritis without rheumatoid factor, multiple sites: Secondary | ICD-10-CM

## 2021-07-24 NOTE — Telephone Encounter (Signed)
Next Visit: 11/26/2021  Last Visit: 06/27/2021  Last Fill: 04/25/2021  DX: Rheumatoid arthritis of multiple sites with negative rheumatoid factor  Current Dose per office note 06/27/2021: methotrexate 0.7ml once weekly.  Labs: 04/25/2021 CBC WNL. Glucose is 106.  Rest of CMP WNL.   Left message to advise patient she is due to update labs.   Okay to refill MTX?

## 2021-07-26 ENCOUNTER — Other Ambulatory Visit: Payer: Self-pay | Admitting: Physician Assistant

## 2021-07-26 ENCOUNTER — Telehealth: Payer: Self-pay | Admitting: *Deleted

## 2021-07-26 NOTE — Telephone Encounter (Signed)
Patient contacted the office and left message stating she was not sure if she was due for labs. She also stating that she needs a new application sent for Humira patient assistance.  Returned call to patient and advised she is due to update labs and we have lab hours from 1:30 to 4:00 pm tomorrow. Patient advised we would have printed application to give to her at that time. Patient advised she would need to fill it out. Patient advised she would need to have her financial documents available.

## 2021-07-26 NOTE — Telephone Encounter (Signed)
Next Visit: 11/26/2021   Last Visit: 06/27/2021   Last Fill: 03/30/2021  DX: Rheumatoid arthritis of multiple sites with negative rheumatoid factor   Current Dose per office note 06/27/2021: not discussed.  Okay to refill Folic Acid?

## 2021-07-27 ENCOUNTER — Other Ambulatory Visit: Payer: Self-pay

## 2021-07-27 ENCOUNTER — Other Ambulatory Visit: Payer: Self-pay | Admitting: *Deleted

## 2021-07-27 DIAGNOSIS — M457 Ankylosing spondylitis of lumbosacral region: Secondary | ICD-10-CM

## 2021-07-27 DIAGNOSIS — Z79899 Other long term (current) drug therapy: Secondary | ICD-10-CM

## 2021-07-27 MED ORDER — HUMIRA (2 PEN) 40 MG/0.4ML ~~LOC~~ AJKT: 40.0000 mg | AUTO-INJECTOR | SUBCUTANEOUS | 0 refills | Status: DC

## 2021-07-27 NOTE — Telephone Encounter (Signed)
Next Visit: 11/26/2021  Last Visit: 06/27/2021  Last Fill: 04/26/2021  MV:HQIONGEXBM arthritis of multiple sites with negative rheumatoid factor   Current Dose per office note 06/27/2021: Humira 40 mg sq injections every 14 days  Labs: 04/25/2021, CBC WNL. Glucose is 106.  Rest of CMP WNL.  TB Gold: 11/06/2020, negative   LMOM labs are due.  Okay to refill Humira?

## 2021-07-28 ENCOUNTER — Other Ambulatory Visit: Payer: Self-pay | Admitting: Internal Medicine

## 2021-07-28 LAB — CBC WITH DIFFERENTIAL/PLATELET
Absolute Monocytes: 806 cells/uL (ref 200–950)
Basophils Absolute: 33 cells/uL (ref 0–200)
Basophils Relative: 0.5 %
Eosinophils Absolute: 208 cells/uL (ref 15–500)
Eosinophils Relative: 3.2 %
HCT: 40.8 % (ref 35.0–45.0)
Hemoglobin: 14 g/dL (ref 11.7–15.5)
Lymphs Abs: 1924 cells/uL (ref 850–3900)
MCH: 33.2 pg — ABNORMAL HIGH (ref 27.0–33.0)
MCHC: 34.3 g/dL (ref 32.0–36.0)
MCV: 96.7 fL (ref 80.0–100.0)
MPV: 10.6 fL (ref 7.5–12.5)
Monocytes Relative: 12.4 %
Neutro Abs: 3530 cells/uL (ref 1500–7800)
Neutrophils Relative %: 54.3 %
Platelets: 228 10*3/uL (ref 140–400)
RBC: 4.22 10*6/uL (ref 3.80–5.10)
RDW: 13.1 % (ref 11.0–15.0)
Total Lymphocyte: 29.6 %
WBC: 6.5 10*3/uL (ref 3.8–10.8)

## 2021-07-28 LAB — COMPLETE METABOLIC PANEL WITH GFR
AG Ratio: 1.5 (calc) (ref 1.0–2.5)
ALT: 14 U/L (ref 6–29)
AST: 18 U/L (ref 10–35)
Albumin: 4.3 g/dL (ref 3.6–5.1)
Alkaline phosphatase (APISO): 56 U/L (ref 37–153)
BUN: 10 mg/dL (ref 7–25)
CO2: 29 mmol/L (ref 20–32)
Calcium: 9.7 mg/dL (ref 8.6–10.4)
Chloride: 103 mmol/L (ref 98–110)
Creat: 0.79 mg/dL (ref 0.60–1.00)
Globulin: 2.9 g/dL (calc) (ref 1.9–3.7)
Glucose, Bld: 68 mg/dL (ref 65–99)
Potassium: 4.1 mmol/L (ref 3.5–5.3)
Sodium: 139 mmol/L (ref 135–146)
Total Bilirubin: 1 mg/dL (ref 0.2–1.2)
Total Protein: 7.2 g/dL (ref 6.1–8.1)
eGFR: 79 mL/min/{1.73_m2} (ref >=60)

## 2021-07-28 NOTE — Progress Notes (Signed)
CBC and CMP are normal.

## 2021-08-11 ENCOUNTER — Other Ambulatory Visit: Payer: Self-pay | Admitting: Physician Assistant

## 2021-08-11 MED ORDER — PREDNISONE 5 MG PO TABS: ORAL_TABLET | ORAL | 0 refills | Status: DC

## 2021-08-11 NOTE — Progress Notes (Signed)
Patient called the on-call service requesting a prescription for prednisone.  She is currently having a flare involving both hands and both wrist joints.  She is experiencing pain and swelling in both hands currently.  She states that she was recently staying with a friend in [place] and fell while at her friend's home and has had increased aches and pains since then.  She denies any serious injuries.  She remains on Humira and methotrexate as prescribed. Sulindac previously alleviated her aches and pain, but she has been avoiding taking sulindac as advised while on MTX.   A Prednisone taper starting at 20 mg tapering by 5 mg every 4 days was sent to the pharmacy today.  Patient also requested for [place] to schedule a sooner follow-up visit.  We will call her on Monday to schedule a visit with Dr. Estanislado Pandy or myself next week. All questions were addressed.  Hazel Sams, PA-C

## 2021-08-13 ENCOUNTER — Other Ambulatory Visit: Payer: Self-pay | Admitting: Internal Medicine

## 2021-08-15 ENCOUNTER — Other Ambulatory Visit: Payer: Self-pay | Admitting: Internal Medicine

## 2021-08-21 ENCOUNTER — Other Ambulatory Visit: Payer: Self-pay | Admitting: Physician Assistant

## 2021-08-21 DIAGNOSIS — M0630 Rheumatoid nodule, unspecified site: Secondary | ICD-10-CM

## 2021-08-21 DIAGNOSIS — M0609 Rheumatoid arthritis without rheumatoid factor, multiple sites: Secondary | ICD-10-CM

## 2021-08-21 NOTE — Telephone Encounter (Signed)
Next Visit: 11/26/2021   Last Visit: 06/27/2021   Last Fill: 07/24/2021 (30 day supply)  DX: Rheumatoid arthritis of multiple sites with negative rheumatoid factor   Current Dose per office note 06/27/2021: methotrexate 0.36ml once weekly.   Labs: 07/27/2021 CBC and CMP are normal.

## 2021-09-06 ENCOUNTER — Other Ambulatory Visit: Payer: Self-pay | Admitting: Internal Medicine

## 2021-09-09 ENCOUNTER — Other Ambulatory Visit: Payer: Self-pay | Admitting: Internal Medicine

## 2021-09-10 ENCOUNTER — Emergency Department (HOSPITAL_COMMUNITY)
Admission: EM | Admit: 2021-09-10 | Discharge: 2021-09-11 | Disposition: A | Discharge status: Home / Self Care | Payer: Medicare Other | Attending: Student | Admitting: Student

## 2021-09-10 ENCOUNTER — Encounter (HOSPITAL_COMMUNITY): Payer: Self-pay

## 2021-09-10 ENCOUNTER — Emergency Department (HOSPITAL_COMMUNITY): Payer: Medicare Other

## 2021-09-10 ENCOUNTER — Other Ambulatory Visit: Payer: Self-pay

## 2021-09-10 DIAGNOSIS — N182 Chronic kidney disease, stage 2 (mild): Secondary | ICD-10-CM | POA: Insufficient documentation

## 2021-09-10 DIAGNOSIS — J3489 Other specified disorders of nose and nasal sinuses: Secondary | ICD-10-CM | POA: Insufficient documentation

## 2021-09-10 DIAGNOSIS — E871 Hypo-osmolality and hyponatremia: Secondary | ICD-10-CM | POA: Diagnosis not present

## 2021-09-10 DIAGNOSIS — J449 Chronic obstructive pulmonary disease, unspecified: Secondary | ICD-10-CM | POA: Diagnosis not present

## 2021-09-10 DIAGNOSIS — Z79899 Other long term (current) drug therapy: Secondary | ICD-10-CM | POA: Diagnosis not present

## 2021-09-10 DIAGNOSIS — Z20822 Contact with and (suspected) exposure to covid-19: Secondary | ICD-10-CM | POA: Insufficient documentation

## 2021-09-10 DIAGNOSIS — R509 Fever, unspecified: Secondary | ICD-10-CM | POA: Insufficient documentation

## 2021-09-10 DIAGNOSIS — I129 Hypertensive chronic kidney disease with stage 1 through stage 4 chronic kidney disease, or unspecified chronic kidney disease: Secondary | ICD-10-CM | POA: Insufficient documentation

## 2021-09-10 DIAGNOSIS — F17210 Nicotine dependence, cigarettes, uncomplicated: Secondary | ICD-10-CM | POA: Diagnosis not present

## 2021-09-10 DIAGNOSIS — B349 Viral infection, unspecified: Secondary | ICD-10-CM

## 2021-09-10 LAB — RESP PANEL BY RT-PCR (FLU A&B, COVID) ARPGX2
Influenza A by PCR: NEGATIVE
Influenza B by PCR: NEGATIVE
SARS Coronavirus 2 by RT PCR: NEGATIVE

## 2021-09-10 LAB — CBC WITH DIFFERENTIAL/PLATELET
Abs Immature Granulocytes: 0.01 10*3/uL (ref 0.00–0.07)
Basophils Absolute: 0 10*3/uL (ref 0.0–0.1)
Basophils Relative: 0 %
Eosinophils Absolute: 0.1 10*3/uL (ref 0.0–0.5)
Eosinophils Relative: 3 %
HCT: 35.4 % — ABNORMAL LOW (ref 36.0–46.0)
Hemoglobin: 12.1 g/dL (ref 12.0–15.0)
Immature Granulocytes: 0 %
Lymphocytes Relative: 30 %
Lymphs Abs: 1.2 10*3/uL (ref 0.7–4.0)
MCH: 32.5 pg (ref 26.0–34.0)
MCHC: 34.2 g/dL (ref 30.0–36.0)
MCV: 95.2 fL (ref 80.0–100.0)
Monocytes Absolute: 0.6 10*3/uL (ref 0.1–1.0)
Monocytes Relative: 15 %
Neutro Abs: 2.1 10*3/uL (ref 1.7–7.7)
Neutrophils Relative %: 52 %
Platelets: 193 10*3/uL (ref 150–400)
RBC: 3.72 MIL/uL — ABNORMAL LOW (ref 3.87–5.11)
RDW: 15 % (ref 11.5–15.5)
WBC: 4.1 10*3/uL (ref 4.0–10.5)
nRBC: 0 % (ref 0.0–0.2)

## 2021-09-10 LAB — BASIC METABOLIC PANEL
Anion gap: 8 (ref 5–15)
BUN: 7 mg/dL — ABNORMAL LOW (ref 8–23)
CO2: 24 mmol/L (ref 22–32)
Calcium: 9.1 mg/dL (ref 8.9–10.3)
Chloride: 102 mmol/L (ref 98–111)
Creatinine, Ser: 0.76 mg/dL (ref 0.44–1.00)
GFR, Estimated: >60 mL/min (ref >60)
Glucose, Bld: 99 mg/dL (ref 70–99)
Potassium: 3.7 mmol/L (ref 3.5–5.1)
Sodium: 134 mmol/L — ABNORMAL LOW (ref 135–145)

## 2021-09-10 NOTE — ED Provider Triage Note (Signed)
Emergency Medicine Provider Triage Evaluation Note  Kirsten Harper , a 76 y.o. female  was evaluated in triage.  Pt complains of cough and fever x4 days.  Fever up to 101 at home.  Relieved with Tylenol.  Review of Systems  Positive:  Negative: See above   Physical Exam  BP 131/74 (BP Location: Right Arm)    Pulse 85    Temp 99 F (37.2 C) (Oral)    Resp 16    Ht 5' 7.5" (1.715 m)    Wt 74.9 kg    SpO2 100%    BMI 25.48 kg/m  Gen:   Awake, no distress   Resp:  Normal effort  MSK:   Moves extremities without difficulty  Other:    Medical Decision Making  Medically screening exam initiated at 6:40 PM.  Appropriate orders placed.  SIDNEY SILBERMAN was informed that the remainder of the evaluation will be completed by another provider, this initial triage assessment does not replace that evaluation, and the importance of remaining in the ED until their evaluation is complete.     Myna Bright [place], [place] 09/10/21 [phone]

## 2021-09-10 NOTE — ED Triage Notes (Signed)
Pt arrived via [facility] from home for feverx4 days. Pt has been taking tylenol for fever and it helps relieve fever. Pt denies N/V/D or any other sx. Per EMS, pt stated she's lethargic.

## 2021-09-11 MED ORDER — PSEUDOEPHEDRINE HCL 30 MG PO TABS: 30.0000 mg | ORAL_TABLET | ORAL | 0 refills | Status: DC | PRN

## 2021-09-11 MED ORDER — BENZONATATE 100 MG PO CAPS: 100.0000 mg | ORAL_CAPSULE | Freq: Three times a day (TID) | ORAL | 0 refills | Status: DC

## 2021-09-11 MED ORDER — GUAIFENESIN ER 600 MG PO TB12: 600.0000 mg | ORAL_TABLET | Freq: Two times a day (BID) | ORAL | 0 refills | Status: DC

## 2021-09-11 NOTE — ED Provider Notes (Signed)
[facility] EMERGENCY DEPARTMENT Provider Note  CSN: [id number] Arrival date & time: 09/10/21 1810  Chief Complaint(s) Fever  HPI Kirsten Harper is a 76 y.o. female with PMH ankylosing spondylitis, CKD 2, COPD who presents emergency department for evaluation of fever.  Patient states for the last 3 days she has had upper respiratory symptoms including congestion, cough and fever Tmax 100.4 at home.  She states that she has been taking Tylenol with relief and her symptoms resolve but she does have persistent fatigue.  She states that she is taking care of her mother who has dementia and is not getting much help at home.  She is thinks that her general fatigue associated with the situation is making things worse.  She denies chest pain, shortness of breath, abdominal pain, nausea, vomiting, diarrhea, dysuria or other systemic symptoms.  Patient unfortunately waited in the waiting room for 14 hours prior to her evaluation and is currently nonfebrile.   Fever Associated symptoms: rhinorrhea    Past Medical History Past Medical History:  Diagnosis Date   Ankylosing spondylitis ([place])    Anxiety    Arthritis    Chronic lower back pain    CKD (chronic kidney disease), stage II    "related to the Ankylosing spondylitis" (04/10/2016)   COPD (chronic obstructive pulmonary disease) ([place])    Eye disease    Hypertension    PONV (postoperative nausea and vomiting)    "didn't bother me the last time I had it in ~ 2015"   Patient Active Problem List   Diagnosis Date Noted   Dermatitis 06/27/2021   Colitis 07/28/2018   Preventative health care 05/04/2018   Advance directive discussed with patient 05/04/2018   RLS (restless legs syndrome) 04/25/2017   Mood disorder ([place]) 04/08/2017   Sensory loss 04/08/2017   COPD (chronic obstructive pulmonary disease) ([place]) 04/08/2017   Ankylosing spondylitis of lumbosacral region ([place]) 09/24/2016   GAD (generalized anxiety disorder)  08/27/2016   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 Authorizing Provider  Adalimumab (HUMIRA PEN) 40 MG/0.4ML PNKT Inject 40 mg into the skin every 14 (fourteen) days. 07/27/21   Ofilia Neas, PA-C  albuterol (VENTOLIN HFA) 108 (90 Base) MCG/ACT inhaler INHALE 1 TO 2 PUFFS INTO THE LUNGS EVERY 6 HOURS AS NEEDED 08/15/21   Venia Carbon, MD  ALPRAZolam Duanne Moron) 0.5 MG tablet Take 0.5 mg by mouth 2 (two) times daily. 09/11/20   [provider]  amLODipine (NORVASC) 10 MG tablet Take 1 tablet (10 mg total) by mouth daily. 06/27/21   Venia Carbon, MD  augmented betamethasone dipropionate (DIPROLENE) 0.05 % ointment Apply topically 2 (two) times daily. 06/29/21   Venia Carbon, MD  dorzolamide-timolol (COSOPT) 22.3-6.8 MG/ML ophthalmic solution Place 1 drop into both eyes 2 (two) times daily.  12/17/16   [provider]  escitalopram (LEXAPRO) 10 MG tablet Take 10 mg by mouth daily. 03/07/20   [provider]  fluticasone (FLONASE) 50 MCG/ACT nasal spray Place into both nostrils. 06/26/20   [provider]  folic acid (FOLVITE) 1 MG tablet TAKE 1 TABLET BY MOUTH EVERY DAY  07/26/21   Gearldine Bienenstock, PA-C  losartan (COZAAR) 100 MG tablet TAKE 1 TABLET BY MOUTH EVERY DAY 08/13/21   Karie Schwalbe, MD  LOTEMAX 0.5 % ophthalmic suspension Place 1 drop into both eyes 2 (two) times daily.  12/17/16   [provider]  Methotrexate Sodium (METHOTREXATE, PF,) 50 MG/2ML injection INJECT 0.4 MLS (10 MG TOTAL) INTO THE SKIN ONCE A WEEK. 08/21/21   Gearldine Bienenstock, PA-C  predniSONE  (DELTASONE) 5 MG tablet Take 4 tablets by mouth daily x4 days, 3 tablets daily x4 days, 2 tablets daily x4 days, 1 tablet daily x4 days. 08/11/21   Gearldine Bienenstock, PA-C  sulindac (CLINORIL) 150 MG tablet TAKE 1 TABLET BY MOUTH TWICE A DAY AS NEEDED Patient not taking: Reported on 06/27/2021 04/30/21   Pollyann Savoy, MD  triamcinolone cream (KENALOG) 0.1 % Apply 1 application topically 2 (two) times daily as needed. 06/27/21   Karie Schwalbe, MD  TUBERCULIN SYR 1CC/27GX1/2" (B-D TB SYRINGE 1CC/27GX1/2") 27G X 1/2" 1 ML MISC 12 Syringes by Does not apply route once a week. 03/30/21   Gearldine Bienenstock, PA-C                                                                                                                                    Past Surgical History Past Surgical History:  Procedure Laterality Date   BROW LIFT AND BLEPHAROPLASTY Bilateral ~ 2015   CATARACT EXTRACTION W/ INTRAOCULAR LENS  IMPLANT, BILATERAL Bilateral 2000s   CYST REMOVAL TRUNK  12/2018   MANDIBLE SURGERY  ~ 2000   "jaw had moved to left; broke my jaw in 5 places; put titanium in; stripped muscle from bone; wired jaw shut"   TUBAL LIGATION  1979   VAGINAL HYSTERECTOMY  1985   Family History Family History  Problem Relation Age of Onset   Hypertension Mother    Heart disease Sister    Diabetes Sister    Pulmonary fibrosis Brother    Idiopathic pulmonary fibrosis Brother    Alzheimer's disease Brother    Dementia Brother    Frontotemporal dementia Brother     Social History Social History   Tobacco Use   Smoking status: Every Day    Packs/day: 1.00    Years: 51.00    Pack years: 51.00    Types: Cigarettes   Smokeless tobacco: Never  Vaping Use   Vaping Use: Never used  Substance Use Topics   Alcohol use: Yes    Alcohol/week: 0.0 standard drinks    Comment: occ   Drug use: No   Allergies Amoxapine and related, Seroquel [quetiapine], and Other  Review of Systems Review of Systems   Constitutional:  Positive for fever.  HENT:  Positive for rhinorrhea.    Physical Exam Vital Signs  I have reviewed the triage vital signs BP 108/67 (BP Location: Right Arm)    Pulse 69  Temp 98.6 F (37 C) (Oral)    Resp 18    Ht 5' 7.5" (1.715 m)    Wt 74.9 kg    SpO2 97%    BMI 25.48 kg/m   Physical Exam Vitals and nursing note reviewed.  Constitutional:      General: She is not in acute distress.    Appearance: She is well-developed.  HENT:     Head: Normocephalic and atraumatic.  Eyes:     Conjunctiva/sclera: Conjunctivae normal.  Cardiovascular:     Rate and Rhythm: Normal rate and regular rhythm.     Heart sounds: No murmur heard. Pulmonary:     Effort: Pulmonary effort is normal. No respiratory distress.     Breath sounds: Normal breath sounds.  Abdominal:     Palpations: Abdomen is soft.     Tenderness: There is no abdominal tenderness.  Musculoskeletal:        General: No swelling.     Cervical back: Neck supple.  Skin:    General: Skin is warm and dry.     Capillary Refill: Capillary refill takes less than 2 seconds.  Neurological:     Mental Status: She is alert.  Psychiatric:        Mood and Affect: Mood normal.    ED Results and Treatments Labs (all labs ordered are listed, but only abnormal results are displayed) Labs Reviewed  CBC WITH DIFFERENTIAL/PLATELET - Abnormal; Notable for the following components:      Result Value   RBC 3.72 (*)    HCT 35.4 (*)    All other components within normal limits  BASIC METABOLIC PANEL - Abnormal; Notable for the following components:   Sodium 134 (*)    BUN 7 (*)    All other components within normal limits  RESP PANEL BY RT-PCR (FLU A&B, COVID) ARPGX2                                                                                                                          Radiology DG Chest 2 View  Result Date: 09/10/2021 CLINICAL DATA:  Cough, headache for 4-5 days, fever EXAM: CHEST - 2 VIEW  COMPARISON:  02/08/2021 FINDINGS: Frontal and lateral views of the chest demonstrate a stable cardiac silhouette. No acute airspace disease, effusion, or pneumothorax. Chronic T12 compression deformity. No acute bony abnormalities. IMPRESSION: 1. No acute intrathoracic process. Electronically Signed   By: Randa Ngo M.D.   On: 09/10/2021 19:19    Pertinent labs & imaging results that were available during my care of the patient were reviewed by me and considered in my medical decision making (see MDM for details).  Medications Ordered in ED Medications - No data to display  Procedures Procedures  (including critical care time)  Medical Decision Making / ED Course   This patient presents to the ED for concern of fever, congestion, this involves an extensive number of treatment options, and is a complaint that carries with it a high risk of complications and morbidity.  The differential diagnosis includes viral URI, COVID-19, pneumonia, influenza  MDM: Patient seen emergency department for evaluation of fever and viral URI symptoms.  Laboratory evaluation largely unremarkable outside of a mild hyponatremia 134.  COVID and flu negative.  Chest x-ray unremarkable.  As patient with no dysuria or urinary symptoms we will not pursue urinalysis.  Patient presentation is consistent with viral URI and she is hemodynamically stable in the emergency department and safe for discharge with outpatient follow-up.  She was discharged with prescriptions for Tessalon Perle, Sudafed and Mucinex.   Additional history obtained:  -External records from outside source obtained and reviewed including: Chart review including previous notes, labs, imaging, consultation notes   Lab Tests: -I ordered, reviewed, and interpreted labs.   The pertinent results include:   Labs Reviewed   CBC WITH DIFFERENTIAL/PLATELET - Abnormal; Notable for the following components:      Result Value   RBC 3.72 (*)    HCT 35.4 (*)    All other components within normal limits  BASIC METABOLIC PANEL - Abnormal; Notable for the following components:   Sodium 134 (*)    BUN 7 (*)    All other components within normal limits  RESP PANEL BY RT-PCR (FLU A&B, COVID) ARPGX2     Imaging Studies ordered: I ordered imaging studies including CXR I independently visualized and interpreted imaging. I agree with the radiologist interpretation   Medicines ordered and prescription drug management: No orders of the defined types were placed in this encounter.   -I have reviewed the patients home medicines and have made adjustments as needed  Critical interventions none   Cardiac Monitoring: The patient was maintained on a cardiac monitor.  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   Reevaluation: After the interventions noted above, I reevaluated the patient and found that they have :improved  Co morbidities that complicate the patient evaluation  Past Medical History:  Diagnosis Date   Ankylosing spondylitis ([place])    Anxiety    Arthritis    Chronic lower back pain    CKD (chronic kidney disease), stage II    "related to the Ankylosing spondylitis" (04/10/2016)   COPD (chronic obstructive pulmonary disease) ([place])    Eye disease    Hypertension    PONV (postoperative nausea and vomiting)    "didn't bother me the last time I had it in ~ 2015"      Dispostion: I considered admission for this patient, but her viral URI symptoms do not meet inpatient criteria and she is safe for discharge.     Final Clinical Impression(s) / ED Diagnoses Final diagnoses:  None     '@PCDICTATION'$ @    Teressa Lower, MD 09/11/21 [phone]

## 2021-09-12 ENCOUNTER — Telehealth: Payer: Self-pay

## 2021-09-12 NOTE — Telephone Encounter (Signed)
Left message to check on pt per dpr.after recent ER visit for fever. ?

## 2021-09-15 ENCOUNTER — Emergency Department (HOSPITAL_COMMUNITY): Payer: Medicare Other

## 2021-09-15 ENCOUNTER — Encounter (HOSPITAL_COMMUNITY): Payer: Self-pay | Admitting: *Deleted

## 2021-09-15 ENCOUNTER — Emergency Department (HOSPITAL_COMMUNITY)
Admission: EM | Admit: 2021-09-15 | Discharge: 2021-09-15 | Disposition: A | Discharge status: Home / Self Care | Payer: Medicare Other | Attending: Emergency Medicine | Admitting: Emergency Medicine

## 2021-09-15 ENCOUNTER — Other Ambulatory Visit: Payer: Self-pay

## 2021-09-15 DIAGNOSIS — Z20822 Contact with and (suspected) exposure to covid-19: Secondary | ICD-10-CM | POA: Insufficient documentation

## 2021-09-15 DIAGNOSIS — J441 Chronic obstructive pulmonary disease with (acute) exacerbation: Secondary | ICD-10-CM | POA: Diagnosis not present

## 2021-09-15 DIAGNOSIS — B349 Viral infection, unspecified: Secondary | ICD-10-CM | POA: Diagnosis not present

## 2021-09-15 DIAGNOSIS — R0602 Shortness of breath: Secondary | ICD-10-CM | POA: Diagnosis present

## 2021-09-15 LAB — CBC WITH DIFFERENTIAL/PLATELET
Abs Immature Granulocytes: 0.02 10*3/uL (ref 0.00–0.07)
Basophils Absolute: 0 10*3/uL (ref 0.0–0.1)
Basophils Relative: 0 %
Eosinophils Absolute: 0.1 10*3/uL (ref 0.0–0.5)
Eosinophils Relative: 2 %
HCT: 32.5 % — ABNORMAL LOW (ref 36.0–46.0)
Hemoglobin: 11.3 g/dL — ABNORMAL LOW (ref 12.0–15.0)
Immature Granulocytes: 0 %
Lymphocytes Relative: 16 %
Lymphs Abs: 0.8 10*3/uL (ref 0.7–4.0)
MCH: 32.1 pg (ref 26.0–34.0)
MCHC: 34.8 g/dL (ref 30.0–36.0)
MCV: 92.3 fL (ref 80.0–100.0)
Monocytes Absolute: 0.6 10*3/uL (ref 0.1–1.0)
Monocytes Relative: 12 %
Neutro Abs: 3.4 10*3/uL (ref 1.7–7.7)
Neutrophils Relative %: 70 %
Platelets: 216 10*3/uL (ref 150–400)
RBC: 3.52 MIL/uL — ABNORMAL LOW (ref 3.87–5.11)
RDW: 14.9 % (ref 11.5–15.5)
WBC: 5 10*3/uL (ref 4.0–10.5)
nRBC: 0 % (ref 0.0–0.2)

## 2021-09-15 LAB — COMPREHENSIVE METABOLIC PANEL
ALT: 10 U/L (ref 0–44)
AST: 14 U/L — ABNORMAL LOW (ref 15–41)
Albumin: 3.6 g/dL (ref 3.5–5.0)
Alkaline Phosphatase: 50 U/L (ref 38–126)
Anion gap: 9 (ref 5–15)
BUN: 8 mg/dL (ref 8–23)
CO2: 23 mmol/L (ref 22–32)
Calcium: 8.6 mg/dL — ABNORMAL LOW (ref 8.9–10.3)
Chloride: 100 mmol/L (ref 98–111)
Creatinine, Ser: 0.54 mg/dL (ref 0.44–1.00)
GFR, Estimated: >60 mL/min (ref >60)
Glucose, Bld: 129 mg/dL — ABNORMAL HIGH (ref 70–99)
Potassium: 3.2 mmol/L — ABNORMAL LOW (ref 3.5–5.1)
Sodium: 132 mmol/L — ABNORMAL LOW (ref 135–145)
Total Bilirubin: 1.1 mg/dL (ref 0.3–1.2)
Total Protein: 7.1 g/dL (ref 6.5–8.1)

## 2021-09-15 LAB — RESP PANEL BY RT-PCR (FLU A&B, COVID) ARPGX2
Influenza A by PCR: NEGATIVE
Influenza B by PCR: NEGATIVE
SARS Coronavirus 2 by RT PCR: NEGATIVE

## 2021-09-15 LAB — LACTIC ACID, PLASMA: Lactic Acid, Venous: 0.9 mmol/L (ref 0.5–1.9)

## 2021-09-15 MED ORDER — PREDNISONE 20 MG PO TABS: 40.0000 mg | ORAL_TABLET | Freq: Every day | ORAL | 6 refills | Status: DC

## 2021-09-15 MED ORDER — ALBUTEROL SULFATE HFA 108 (90 BASE) MCG/ACT IN AERS
1.0000 | INHALATION_SPRAY | Freq: Once | RESPIRATORY_TRACT | Status: AC
  Administered 2021-09-15: 1 via RESPIRATORY_TRACT
  Filled 2021-09-15: qty 6.7

## 2021-09-15 MED ORDER — ACETAMINOPHEN 500 MG PO TABS
500.0000 mg | ORAL_TABLET | Freq: Once | ORAL | Status: AC
  Administered 2021-09-15: 500 mg via ORAL
  Filled 2021-09-15: qty 1

## 2021-09-15 MED ORDER — ALPRAZOLAM 0.5 MG PO TABS
0.5000 mg | ORAL_TABLET | Freq: Once | ORAL | Status: AC
  Administered 2021-09-15: 0.5 mg via ORAL
  Filled 2021-09-15: qty 1

## 2021-09-15 MED ORDER — DOXYCYCLINE HYCLATE 100 MG PO CAPS: 100.0000 mg | ORAL_CAPSULE | Freq: Two times a day (BID) | ORAL | 0 refills | Status: DC

## 2021-09-15 NOTE — ED Provider Notes (Signed)
[place] DEPT Provider Note   CSN: [id number] Arrival date & time: 09/15/21  0102     History  Chief Complaint  Patient presents with   Shortness of Breath    Kirsten Harper is a 76 y.o. female.   Shortness of Breath Associated symptoms: fever   Associated symptoms: no abdominal pain and no chest pain   Patient presents with shortness of breath.  Has had around 2 weeks.  Seen in the ER about a week ago for similar symptoms.  Negative x-ray at that time.  States more short of breath.  States she feels very nervous.  States her ankylosing spondylitis is also flaring up.  States she has back pain.  Fevers upon arrival.  Had sats of 93% for EMS.  Is on immunosuppression for her AS.  No known sick contacts.  Does have a history of COPD.   Past Medical History:  Diagnosis Date   Ankylosing spondylitis ([facility])    Anxiety    Arthritis    Chronic lower back pain    CKD (chronic kidney disease), stage II    "related to the Ankylosing spondylitis" (04/10/2016)   COPD (chronic obstructive pulmonary disease) ([place])    Eye disease    Hypertension    PONV (postoperative nausea and vomiting)    "didn't bother me the last time I had it in ~ 2015"    Home Medications Prior to Admission medications   Medication Sig Start Date End Date Taking? Authorizing Provider  Adalimumab (HUMIRA PEN) 40 MG/0.4ML PNKT Inject 40 mg into the skin every 14 (fourteen) days. 07/27/21  Yes Ofilia Neas, PA-C  ALPRAZolam Duanne Moron) 0.5 MG tablet Take 0.5 mg by mouth 2 (two) times daily. 09/11/20  Yes [provider]  amLODipine (NORVASC) 10 MG tablet Take 1 tablet (10 mg total) by mouth daily. 06/27/21  Yes Viviana Simpler I, MD  amLODipine (NORVASC) 5 MG tablet Take 10 mg by mouth daily. 07/03/19  Yes [provider]  dorzolamide-timolol (COSOPT) 22.3-6.8 MG/ML ophthalmic solution Place 1 drop into both eyes 2 (two) times daily.  12/17/16  Yes [provider]   doxycycline (MONODOX) 50 MG capsule Take 50 mg by mouth daily. 08/21/21  Yes [provider]  doxycycline (VIBRAMYCIN) 100 MG capsule Take 1 capsule (100 mg total) by mouth 2 (two) times daily. 09/15/21  Yes Davonna Belling, MD  escitalopram (LEXAPRO) 10 MG tablet Take 10 mg by mouth daily. 03/07/20  Yes [provider]  fluticasone (FLONASE) 50 MCG/ACT nasal spray Place 1 spray into both nostrils daily. 06/26/20  Yes [provider]  folic acid (FOLVITE) 1 MG tablet TAKE 1 TABLET BY MOUTH EVERY DAY Patient taking differently: Take 1 mg by mouth daily. 07/26/21  Yes Ofilia Neas, PA-C  guaiFENesin (MUCINEX) 600 MG 12 hr tablet Take 1 tablet (600 mg total) by mouth 2 (two) times daily for 14 days. 09/11/21 09/25/21 Yes Kommor, [place], MD  hydrOXYzine (ATARAX) 10 MG tablet Take 10 mg by mouth 3 (three) times daily as needed for anxiety or itching. 07/03/21  Yes [provider]  losartan (COZAAR) 100 MG tablet TAKE 1 TABLET BY MOUTH EVERY DAY 08/13/21  Yes Venia Carbon, MD  LOTEMAX 0.5 % ophthalmic suspension Place 1 drop into both eyes 2 (two) times daily.  12/17/16  Yes [provider]  Methotrexate Sodium (METHOTREXATE, PF,) 50 MG/2ML injection INJECT 0.4 MLS (10 MG TOTAL) INTO THE SKIN ONCE A WEEK. 08/21/21  Yes Ofilia Neas, PA-C  predniSONE (DELTASONE) 20 MG tablet Take 2 tablets (40 mg total) by mouth daily. 09/15/21  Yes Davonna Belling, MD  albuterol (VENTOLIN HFA) 108 (90 Base) MCG/ACT inhaler INHALE 1 TO 2 PUFFS INTO THE LUNGS EVERY 6 HOURS AS NEEDED Patient not taking: Reported on 09/15/2021 08/15/21   Venia Carbon, MD  augmented betamethasone dipropionate (DIPROLENE) 0.05 % ointment Apply topically 2 (two) times daily. Patient not taking: Reported on 09/15/2021 06/29/21   Venia Carbon, MD  benzonatate (TESSALON) 100 MG capsule Take 1 capsule (100 mg total) by mouth every 8 (eight) hours. Patient not taking: Reported on 09/15/2021 09/11/21    Kommor, Debe Coder, MD  losartan (COZAAR) 100 MG tablet 1 tablet (100 mg total) every morning.    [provider]  pseudoephedrine (SUDAFED) 30 MG tablet Take 1 tablet (30 mg total) by mouth every 4 (four) hours as needed for congestion. Patient not taking: Reported on 09/15/2021 09/11/21   Kommor, Debe Coder, MD  sulindac (CLINORIL) 150 MG tablet TAKE 1 TABLET BY MOUTH TWICE A DAY AS NEEDED Patient not taking: Reported on 06/27/2021 04/30/21   Bo Merino, MD  triamcinolone cream (KENALOG) 0.1 % Apply 1 application topically 2 (two) times daily as needed. Patient not taking: Reported on 09/15/2021 06/27/21   Venia Carbon, MD  TUBERCULIN SYR 1CC/27GX1/2" (B-D TB SYRINGE 1CC/27GX1/2") 27G X 1/2" 1 ML MISC 12 Syringes by Does not apply route once a week. 03/30/21   Ofilia Neas, PA-C      Allergies    Amoxapine and related, Seroquel [quetiapine], and Other    Review of Systems   Review of Systems  Constitutional:  Positive for fever. Negative for appetite change.  Respiratory:  Positive for shortness of breath.   Cardiovascular:  Negative for chest pain.  Gastrointestinal:  Negative for abdominal pain.  Genitourinary:  Negative for flank pain.  Musculoskeletal:  Positive for back pain.  Psychiatric/Behavioral:  The patient is nervous/anxious.    Physical Exam Updated Vital Signs BP 112/63    Pulse 70    Temp 99 F (37.2 C) (Oral)    Resp (!) 25    Ht 5' 7.5" (1.715 m)    Wt 74.8 kg    SpO2 97%    BMI 25.46 kg/m  Physical Exam Vitals and nursing note reviewed.  Cardiovascular:     Rate and Rhythm: Regular rhythm.  Pulmonary:     Comments: Mildly harsh breath sounds without focal rales or rhonchi. Chest:     Chest wall: No tenderness.  Abdominal:     Tenderness: There is no abdominal tenderness.  Musculoskeletal:     Right lower leg: No edema.     Left lower leg: No edema.  Neurological:     Mental Status: She is alert.  Psychiatric:        Mood and Affect: Mood is  anxious.    ED Results / Procedures / Treatments   Labs (all labs ordered are listed, but only abnormal results are displayed) Labs Reviewed  COMPREHENSIVE METABOLIC PANEL - Abnormal; Notable for the following components:      Result Value   Sodium 132 (*)    Potassium 3.2 (*)    Glucose, Bld 129 (*)    Calcium 8.6 (*)    AST 14 (*)    All other components within normal limits  CBC WITH DIFFERENTIAL/PLATELET - Abnormal; Notable for the following components:   RBC 3.52 (*)  Hemoglobin 11.3 (*)    HCT 32.5 (*)    All other components within normal limits  RESP PANEL BY RT-PCR (FLU A&B, COVID) ARPGX2  CULTURE, BLOOD (ROUTINE X 2)  CULTURE, BLOOD (ROUTINE X 2)  LACTIC ACID, PLASMA    EKG None  Radiology DG Chest Portable 1 View  Result Date: 09/15/2021 CLINICAL DATA:  76 year old female with increase shortness of breath and weakness for 2 weeks. Smoker. EXAM: PORTABLE CHEST 1 VIEW COMPARISON:  09/10/2021 and earlier. FINDINGS: Portable AP semi upright view at 0729 hours. Lung volumes and mediastinal contours are stable and within normal limits. Mild diffuse increased pulmonary interstitial markings appear stable from last year. No pneumothorax, pleural effusion or confluent opacity. No acute osseous abnormality identified. IMPRESSION: Stable and probably smoking related increased interstitial markings. No acute cardiopulmonary abnormality. Electronically Signed   By: Genevie Ann M.D.   On: 09/15/2021 07:50    Procedures Procedures    Medications Ordered in ED Medications  acetaminophen (TYLENOL) tablet 500 mg (500 mg Oral Given 09/15/21 0807)    ED Course/ Medical Decision Making/ A&P                           Medical Decision Making Amount and/or Complexity of Data Reviewed Labs: ordered. Radiology: ordered.  Risk OTC drugs. Prescription drug management.   Patient presents with fever shortness of breath.  History of COPD.  No known sick contacts.  Recently seen for  similar symptoms.  Negative flu and COVID test here x-ray independently interpreted and showed some chronic findings.  Not hypoxic but was little dyspneic.  Ambulated without pulse ox with sats of 96%.  Hemoglobin at baseline.  Deardorff count reassuring.  Has defervesced.  Normal lactic acid.  Will discharge home.  With history of COPD will treat with Po steroids and antibiotics since she has had similar sputum production.  Will discharge home with outpatient follow-up as needed.  Doubt severe sepsis.  Appears stable for discharge        Final Clinical Impression(s) / ED Diagnoses Final diagnoses:  COPD exacerbation ([place])  Viral syndrome    Rx / DC Orders ED Discharge Orders          Ordered    doxycycline (VIBRAMYCIN) 100 MG capsule  2 times daily        09/15/21 1234    predniSONE (DELTASONE) 20 MG tablet  Daily        09/15/21 1234              Davonna Belling, MD 09/15/21 1239

## 2021-09-15 NOTE — ED Triage Notes (Signed)
Pt brought in by ems for c/o increased sob and weakness x 2 weeks; O2 sats with ems was 93%, pt place on O2 at 2L and increased 99 ?

## 2021-09-15 NOTE — ED Notes (Signed)
Patient ambulated, maintained O2 sats between 96-98% with no dizziness or increased work of breathing. RN informed. ?

## 2021-09-17 ENCOUNTER — Telehealth: Payer: Self-pay | Admitting: Rheumatology

## 2021-09-17 NOTE — Telephone Encounter (Signed)
Patient was started on prednisone and doxycycline for a COPD exacerbation on 09/15/21.  ?She will need to hold humira and MTX while taking doxycyline and until the infection has completely resolved. Please advise the patient to follow up with Pcp for further evaluation and management.

## 2021-09-17 NOTE — Telephone Encounter (Signed)
I called patient, patient verbalized understanding, Patient was started on prednisone and doxycycline for a COPD exacerbation on 09/15/21.  ?She will need to hold humira and MTX while taking doxycyline and until the infection has completely resolved. Please advise the patient to follow up with Pcp for further evaluation and management.  ?

## 2021-09-17 NOTE — Telephone Encounter (Signed)
Patient called the office stating she was admitted to the hospital on Sunday and the weekend before. Patient states she believes it was because of her Ankylosing Spondylitis. Patient states they told her to follow up with her primary asap but she thinks she needs to be seen by Dr. Estanislado Pandy instead. Patient would like Dr. Estanislado Pandy to review her chart for her ED visits and let her know if she needs to be seen by her. ?

## 2021-09-18 ENCOUNTER — Telehealth: Payer: Self-pay | Admitting: Rheumatology

## 2021-09-18 NOTE — Telephone Encounter (Signed)
Patient called the office stating she would like to be prescribed gabapentin. Patient states she has tried tylenol and prednisone and is in pain. Patient states prednisone worked but she took a friend of her's Gabapentin once and it worked well. Please advise. ?

## 2021-09-18 NOTE — Telephone Encounter (Signed)
Attempted to contact the patient and left message for patient to call the office.  

## 2021-09-18 NOTE — Telephone Encounter (Signed)
Yes, she should contact her PCP.

## 2021-09-19 NOTE — Telephone Encounter (Signed)
Patient returned call to the office. Patient advised she contact her PCP.  ?

## 2021-09-19 NOTE — Telephone Encounter (Signed)
Attempted to contact the patient and left message for patient to call the office.  

## 2021-09-20 ENCOUNTER — Encounter: Payer: Medicare Other | Admitting: Internal Medicine

## 2021-09-20 LAB — CULTURE, BLOOD (ROUTINE X 2)
Culture: NO GROWTH
Culture: NO GROWTH
Special Requests: ADEQUATE
Special Requests: ADEQUATE

## 2021-09-21 ENCOUNTER — Ambulatory Visit: Payer: Medicare Other | Admitting: Physician Assistant

## 2021-09-25 ENCOUNTER — Encounter: Payer: Self-pay | Admitting: Internal Medicine

## 2021-09-25 ENCOUNTER — Other Ambulatory Visit: Payer: Self-pay

## 2021-09-25 ENCOUNTER — Ambulatory Visit (INDEPENDENT_AMBULATORY_CARE_PROVIDER_SITE_OTHER): Payer: Medicare Other | Admitting: Internal Medicine

## 2021-09-25 VITALS — BP 108/60 | HR 88 | Temp 97.9°F | Ht 67.0 in | Wt 152.0 lb

## 2021-09-25 DIAGNOSIS — Z7189 Other specified counseling: Secondary | ICD-10-CM

## 2021-09-25 DIAGNOSIS — M457 Ankylosing spondylitis of lumbosacral region: Secondary | ICD-10-CM | POA: Diagnosis not present

## 2021-09-25 DIAGNOSIS — F39 Unspecified mood [affective] disorder: Secondary | ICD-10-CM | POA: Diagnosis not present

## 2021-09-25 DIAGNOSIS — Z0000 Encounter for general adult medical examination without abnormal findings: Secondary | ICD-10-CM | POA: Diagnosis not present

## 2021-09-25 DIAGNOSIS — F1320 Sedative, hypnotic or anxiolytic dependence, uncomplicated: Secondary | ICD-10-CM | POA: Diagnosis not present

## 2021-09-25 DIAGNOSIS — I10 Essential (primary) hypertension: Secondary | ICD-10-CM | POA: Diagnosis not present

## 2021-09-25 DIAGNOSIS — J449 Chronic obstructive pulmonary disease, unspecified: Secondary | ICD-10-CM

## 2021-09-25 DIAGNOSIS — G2581 Restless legs syndrome: Secondary | ICD-10-CM

## 2021-09-25 LAB — RENAL FUNCTION PANEL
Albumin: 3.8 g/dL (ref 3.5–5.2)
BUN: 9 mg/dL (ref 6–23)
CO2: 27 mEq/L (ref 19–32)
Calcium: 9.6 mg/dL (ref 8.4–10.5)
Chloride: 102 mEq/L (ref 96–112)
Creatinine, Ser: 0.78 mg/dL (ref 0.40–1.20)
GFR: 75.02 mL/min (ref >60.00)
Glucose, Bld: 93 mg/dL (ref 70–99)
Phosphorus: 3.9 mg/dL (ref 2.3–4.6)
Potassium: 4 mEq/L (ref 3.5–5.1)
Sodium: 135 mEq/L (ref 135–145)

## 2021-09-25 LAB — HEPATIC FUNCTION PANEL
ALT: 11 U/L (ref 0–35)
AST: 17 U/L (ref 0–37)
Albumin: 3.8 g/dL (ref 3.5–5.2)
Alkaline Phosphatase: 53 U/L (ref 39–117)
Bilirubin, Direct: 0.2 mg/dL (ref 0.0–0.3)
Total Bilirubin: 1 mg/dL (ref 0.2–1.2)
Total Protein: 7.1 g/dL (ref 6.0–8.3)

## 2021-09-25 LAB — CBC
HCT: 31.6 % — ABNORMAL LOW (ref 36.0–46.0)
Hemoglobin: 10.8 g/dL — ABNORMAL LOW (ref 12.0–15.0)
MCHC: 34.3 g/dL (ref 30.0–36.0)
MCV: 92.2 fl (ref 78.0–100.0)
Platelets: 237 10*3/uL (ref 150.0–400.0)
RBC: 3.43 Mil/uL — ABNORMAL LOW (ref 3.87–5.11)
RDW: 16.2 % — ABNORMAL HIGH (ref 11.5–15.5)
WBC: 5.5 10*3/uL (ref 4.0–10.5)

## 2021-09-25 MED ORDER — GABAPENTIN 300 MG PO CAPS: 300.0000 mg | ORAL_CAPSULE | Freq: Every day | ORAL | 5 refills | Status: AC

## 2021-09-25 NOTE — Assessment & Plan Note (Signed)
BP Readings from Last 3 Encounters:  ?09/25/21 108/60  ?09/15/21 130/66  ?09/11/21 108/67  ? ?Good control on losartan 100 and amlodipine '10mg'$  daily ?

## 2021-09-25 NOTE — Assessment & Plan Note (Signed)
Has DNR 

## 2021-09-25 NOTE — Assessment & Plan Note (Signed)
Mostly anxiety ?Does have some help from the escitalopram '10mg'$  daily and xanax 0.5 bid ?

## 2021-09-25 NOTE — Progress Notes (Addendum)
? ?Subjective:  ? ? Patient ID: Kirsten Harper, female    DOB: July 30, 1945, 76 y.o.   MRN: [id number] ? ?HPI ?Here for Medicare wellness visit and follow up of chronic health conditions ?Reviewed advanced directives ?Reviewed other doctors--Dr Shah---retina specialist, Dr Carolanne Grumbling, Dr Athar---neurology, Dr Patrecia Pour --rheumatology, Dr Lenell Antu, Dr Deneise Lever, has derm (name?) ?No hospitalizations or surgery in the past year ?Poor vision--continues with retina specialist ?Hearing is fine ?Has had 2 falls---but no sig injury ?Chronic mood issues ?Did stop smoking---3 weeks ago!! Was having more trouble with breathing, etc ?Rare alcohol--mostly wine ?Can do housework--mom doesn't want things done though. ?Memory is okay ? ?2 recent ER visits for fevers ?Concerned about ankylosing spondylitis in ribs, etc ?CXR showed chronic changes but no infection ?Worried about COVID---but tests negative ?Did have respiratory symptoms ? ?Continues with Dr Lanell Persons wondering about a second opinion ?Is on the IV methotrexate weekly and humira (every 2 weeks) ?Still having joint pains---stiff in AM. Some right shoulder pain ? 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 ?Not coughing ?Still gets DOE---hard with exertion ?Some wheezing ?Current Outpatient Medications on File Prior to Visit  ?Medication Sig Dispense Refill  ? Adalimumab (HUMIRA PEN) 40 MG/0.4ML PNKT Inject 40 mg into the skin every 14 (fourteen) days. 3 each 0  ? albuterol (VENTOLIN HFA) 108 (90 Base) MCG/ACT inhaler INHALE 1 TO 2 PUFFS INTO THE LUNGS EVERY 6 HOURS AS NEEDED 18 each 0  ? ALPRAZolam (XANAX) 0.5 MG tablet Take 0.5 mg by mouth 2 (two) times daily.    ? amLODipine (NORVASC) 10 MG tablet Take 1 tablet (10 mg total) by mouth daily. 90 tablet 3  ? dorzolamide-timolol (COSOPT) 22.3-6.8 MG/ML ophthalmic solution Place 1 drop into both eyes 2 (two) times daily.     ? escitalopram  (LEXAPRO) 10 MG tablet Take 10 mg by mouth daily.    ? fluticasone (FLONASE) 50 MCG/ACT nasal spray Place 1 spray into both nostrils daily.    ? losartan (COZAAR) 100 MG tablet TAKE 1 TABLET BY MOUTH EVERY DAY 90 tablet 0  ? LOTEMAX 0.5 % ophthalmic suspension Place 1 drop into both eyes 2 (two) times daily.     ? TUBERCULIN SYR 1CC/27GX1/2" (B-D TB SYRINGE 1CC/27GX1/2") 27G X 1/2" 1 ML MISC 12 Syringes by Does not apply route once a week. 12 each 3  ? folic acid (FOLVITE) 1 MG tablet TAKE 1 TABLET BY MOUTH EVERY DAY (Patient not taking: Reported on 09/25/2021) 90 tablet 3  ? Methotrexate Sodium (METHOTREXATE, PF,) 50 MG/2ML injection INJECT 0.4 MLS (10 MG TOTAL) INTO THE SKIN ONCE A WEEK. (Patient not taking: Reported on 09/25/2021) 8 mL 2  ? ?No current facility-administered medications on file prior to visit.  ? ? ?Allergies  ?Allergen Reactions  ? Amoxapine And Related Other (See Comments)  ?  (Asendin- "Tricyclic antidepressant") Patient reports it did not work for her  ? Seroquel [Quetiapine] Other (See Comments)  ?  Worsened restless legs  ? Other Palpitations and Other (See Comments)  ?  Psych med (name not recalled by the patient, but it "starts with a Z")- Worsened restless legs, also (NOT Zyprexa; I asked)  ? ? ?Past Medical History:  ?Diagnosis Date  ? Ankylosing spondylitis ([place])   ? Anxiety   ? Arthritis   ? Chronic lower back pain   ? CKD (chronic kidney disease), stage II   ? "related to the Ankylosing spondylitis" (04/10/2016)  ?  COPD (chronic obstructive pulmonary disease) ([place])   ? Eye disease   ? Hypertension   ? PONV (postoperative nausea and vomiting)   ? "didn't bother me the last time I had it in ~ 2015"  ? ? ?Past Surgical History:  ?Procedure Laterality Date  ? BROW LIFT AND BLEPHAROPLASTY Bilateral ~ 2015  ? CATARACT EXTRACTION W/ INTRAOCULAR LENS  IMPLANT, BILATERAL Bilateral 2000s  ? CYST REMOVAL TRUNK  12/2018  ? MANDIBLE SURGERY  ~ 2000  ? "jaw had moved to left; broke my jaw in 5 places;  put titanium in; stripped muscle from bone; wired jaw shut"  ? TUBAL LIGATION  1979  ? VAGINAL HYSTERECTOMY  1985  ? ? ?Family History  ?Problem Relation Age of Onset  ? Hypertension Mother   ? Heart disease Sister   ? Diabetes Sister   ? Pulmonary fibrosis Brother   ? Idiopathic pulmonary fibrosis Brother   ? Alzheimer's disease Brother   ? Dementia Brother   ? Frontotemporal dementia Brother   ? ? ?Social History  ? ?Socioeconomic History  ? Marital status: Divorced  ?  Spouse name: Not on file  ? Number of children: 2  ? Years of education: Not on file  ? Highest education level: Not on file  ?Occupational History  ? Occupation: Sales promotion account executive for [employer]  ?  Comment: Retired  ?Tobacco Use  ? Smoking status: Former  ?  Packs/day: 1.00  ?  Years: 51.00  ?  Pack years: 51.00  ?  Types: Cigarettes  ?  Quit date: 09/11/2021  ?  Years since quitting: 0.0  ? Smokeless tobacco: Never  ?Vaping Use  ? Vaping Use: Never used  ?Substance and Sexual Activity  ? Alcohol use: Yes  ?  Alcohol/week: 0.0 standard drinks  ?  Comment: occ  ? Drug use: No  ? Sexual activity: Never  ?Other Topics Concern  ? Not on file  ?Social History Narrative  ? 2 sons--not in area  ? Lives on property with her mother Kirsten Harper  ?   ? Has living will  ? Would want one of her sons, Kirsten Harper, to be Media planner  ? Requests DNR  ? No tube feeds  ? ?Social Determinants of Health  ? ?Financial Resource Strain: Not on file  ?Food Insecurity: Not on file  ?Transportation Needs: Not on file  ?Physical Activity: Not on file  ?Stress: Not on file  ?Social Connections: Not on file  ?Intimate Partner Violence: Not on file  ? ?Review of Systems ?Appetite is okay ?Has lost 40# over the past 10 years---relates a lot of it to having to care for her mom ?Was due for colonoscopy today---will have to reschedule (due to fever) ?Not sleeping well ?Wears seat belt ?Teeth are okay ?No suspicious skin lesions ?Bowels are okay---no blood ?No dysuria. No  incontinence ?No chest pain ?Occasional palpitations ?No dizziness or syncope ?No headaches ? ?   ?Objective:  ? Physical Exam ?Constitutional:   ?   Appearance: Normal appearance.  ?HENT:  ?   Mouth/Throat:  ?   Comments: No lesions ?Eyes:  ?   Conjunctiva/sclera: Conjunctivae normal.  ?   Pupils: Pupils are equal, round, and reactive to light.  ?Cardiovascular:  ?   Rate and Rhythm: Normal rate and regular rhythm.  ?   Pulses: Normal pulses.  ?   Heart sounds: No murmur heard. ?  No gallop.  ?Pulmonary:  ?   Effort: Pulmonary effort  is normal.  ?   Breath sounds: Normal breath sounds. No wheezing or rales.  ?Abdominal:  ?   Palpations: Abdomen is soft.  ?   Tenderness: There is no abdominal tenderness.  ?Musculoskeletal:  ?   Cervical back: Neck supple.  ?   Right lower leg: No edema.  ?   Left lower leg: No edema.  ?   Comments: Some thickening and mild tenderness in hand PIPs  ?Skin: ?   Findings: No lesion or rash.  ?Neurological:  ?   General: No focal deficit present.  ?   Mental Status: She is alert and oriented to person, place, and time.  ?   Comments: Mini-cog normal  ?Psychiatric:     ?   Mood and Affect: Mood normal.     ?   Behavior: Behavior normal.  ?  ? ? ? ? ?   ?Assessment & Plan:  ? ?

## 2021-09-25 NOTE — Assessment & Plan Note (Signed)
Ongoing wheezing and DOE ?Quit smoking 3 weeks ago!! ?No Rx ?

## 2021-09-25 NOTE — Assessment & Plan Note (Signed)
?  RA per Dr Patrecia Pour as well ?Is on weekly MTX injections and humira every 2 weeks ?

## 2021-09-25 NOTE — Assessment & Plan Note (Signed)
Off ropinirole now ?Did well with friend's gabapentin--will try at bedtime ?

## 2021-09-25 NOTE — Progress Notes (Signed)
Hearing Screening - Comments:: Passed whisper test Vision Screening - Comments:: February 2023  

## 2021-09-25 NOTE — Assessment & Plan Note (Signed)
I have personally reviewed the Medicare Annual Wellness questionnaire and have noted ?1. The patient's medical and social history ?2. Their use of alcohol, tobacco or illicit drugs ?3. Their current medications and supplements ?4. The patient's functional ability including ADL's, fall risks, home safety risks and hearing or visual ?            impairment. ?5. Diet and physical activities ?6. Evidence for depression or mood disorders ? ?The patients weight, height, BMI and visual acuity have been recorded in the chart ?I have made referrals, counseling and provided education to the patient based review of the above and I have provided the pt with a written personalized care plan for preventive services. ? ?I have provided you with a copy of your personalized plan for preventive services. Please take the time to review along with your updated medication list. ? ?Due for colonoscopy --has to reschedule ?Prefers no mammograms ?Not able to exercise due to pain ?Had 2 COVID boosters  ?Due for flu vaccine in the fall ?No pap due to age ?

## 2021-09-25 NOTE — Assessment & Plan Note (Signed)
PDMP reviewed.

## 2021-09-26 NOTE — Progress Notes (Signed)
Dr. Silvio Pate, Kirsten Harper is currently not on methotrexate and Humira due to an infection.  Her hemoglobin is lower than before.  Methotrexate typically does not cause a drop in hemoglobin.  She may need further evaluation for anemia.  You may consider Hemoccult and if negative she may need hematology evaluation.

## 2021-09-26 NOTE — Progress Notes (Signed)
Kirsten Harper has been holding MTX and humira for a recent infection.  Hgb has been trending down over past 2 weeks.

## 2021-09-27 ENCOUNTER — Other Ambulatory Visit: Payer: Self-pay | Admitting: Internal Medicine

## 2021-09-27 NOTE — Progress Notes (Signed)
Thank you :)

## 2021-10-04 ENCOUNTER — Other Ambulatory Visit: Payer: Self-pay

## 2021-10-04 ENCOUNTER — Encounter: Payer: Self-pay | Admitting: Physician Assistant

## 2021-10-04 ENCOUNTER — Ambulatory Visit (INDEPENDENT_AMBULATORY_CARE_PROVIDER_SITE_OTHER): Payer: Medicare Other | Admitting: Physician Assistant

## 2021-10-04 VITALS — BP 115/73 | HR 102 | Ht 67.0 in | Wt 153.2 lb

## 2021-10-04 DIAGNOSIS — Z79899 Other long term (current) drug therapy: Secondary | ICD-10-CM

## 2021-10-04 DIAGNOSIS — Z111 Encounter for screening for respiratory tuberculosis: Secondary | ICD-10-CM

## 2021-10-04 DIAGNOSIS — Z8659 Personal history of other mental and behavioral disorders: Secondary | ICD-10-CM

## 2021-10-04 DIAGNOSIS — M0609 Rheumatoid arthritis without rheumatoid factor, multiple sites: Secondary | ICD-10-CM

## 2021-10-04 DIAGNOSIS — M47819 Spondylosis without myelopathy or radiculopathy, site unspecified: Secondary | ICD-10-CM

## 2021-10-04 DIAGNOSIS — M25511 Pain in right shoulder: Secondary | ICD-10-CM

## 2021-10-04 DIAGNOSIS — M170 Bilateral primary osteoarthritis of knee: Secondary | ICD-10-CM

## 2021-10-04 DIAGNOSIS — G8929 Other chronic pain: Secondary | ICD-10-CM | POA: Diagnosis not present

## 2021-10-04 DIAGNOSIS — Z9181 History of falling: Secondary | ICD-10-CM

## 2021-10-04 DIAGNOSIS — M19042 Primary osteoarthritis, left hand: Secondary | ICD-10-CM

## 2021-10-04 DIAGNOSIS — M0630 Rheumatoid nodule, unspecified site: Secondary | ICD-10-CM | POA: Diagnosis not present

## 2021-10-04 DIAGNOSIS — H209 Unspecified iridocyclitis: Secondary | ICD-10-CM

## 2021-10-04 DIAGNOSIS — Z8709 Personal history of other diseases of the respiratory system: Secondary | ICD-10-CM

## 2021-10-04 DIAGNOSIS — D649 Anemia, unspecified: Secondary | ICD-10-CM

## 2021-10-04 DIAGNOSIS — Z87891 Personal history of nicotine dependence: Secondary | ICD-10-CM

## 2021-10-04 DIAGNOSIS — Z8679 Personal history of other diseases of the circulatory system: Secondary | ICD-10-CM

## 2021-10-04 DIAGNOSIS — M19041 Primary osteoarthritis, right hand: Secondary | ICD-10-CM

## 2021-10-04 DIAGNOSIS — M5136 Other intervertebral disc degeneration, lumbar region: Secondary | ICD-10-CM

## 2021-10-04 MED ORDER — LIDOCAINE HCL 1 % IJ SOLN
1.5000 mL | INTRAMUSCULAR | Status: AC | PRN
  Administered 2021-10-04: 1.5 mL

## 2021-10-04 MED ORDER — TRIAMCINOLONE ACETONIDE 40 MG/ML IJ SUSP
40.0000 mg | INTRAMUSCULAR | Status: AC | PRN
  Administered 2021-10-04: 40 mg via INTRA_ARTICULAR

## 2021-10-04 MED ORDER — PREDNISONE 5 MG PO TABS: ORAL_TABLET | ORAL | 0 refills | Status: DC

## 2021-10-04 NOTE — Progress Notes (Addendum)
? ?Office Visit Note ? ?Patient: Kirsten Harper             ?Date of Birth: January 26, 1946           ?MRN: [id number]             ?PCP: Venia Carbon, MD ?Referring: Venia Carbon, MD ?Visit Date: 10/04/2021 ?Occupation: _0 @ ? ?Subjective:  ?Pain in multiple joints ? ? ?History of Present Illness: Kirsten Harper is a 76 y.o. female with history of seronegative rheumatoid arthritis, uveitis, osteoarthritis, DDD.  Patient has been holding Humira and methotrexate for about 3-4 weeks due to a recent viral infection.  She was initially evaluated in the ED on 09/11/2021 and reevaluated on 09/15/2021 for a COPD exacerbation.  She was treated with a course of doxycycline.  She was evaluated by her PCP on 09/25/2021 and had updated lab work at that time.  She was found to be anemic and will be having an iron panel checked today.  She states that her infection is completely resolved.  She does not have a productive cough, chills, congestion, or fever at this time.  She plans on resuming Humira and methotrexate since her symptoms have completely resolved.  She states that while off of therapy she has been having a flare involving multiple joints especially her right shoulder, both wrists, and both hands.  She is having difficulty raising her right arm due to the severity of pain.  She has also noticed decreased grip strength and general muscular deconditioning.  She is also having increased discomfort in the left knee joint.  She has not had any recent falls but is nervous about falling due to poor balance. She has been taking Tylenol as needed for symptomatic relief.  She denies any signs or symptoms of a uveitis flare. ? ?Activities of Daily Living:  ?Patient reports morning stiffness for 30 minutes.   ?Patient Reports nocturnal pain.  ?Difficulty dressing/grooming: Reports ?Difficulty climbing stairs: Reports ?Difficulty getting out of chair: Reports ?Difficulty using hands for taps, buttons, cutlery, and/or writing:  Reports ? ?Review of Systems  ?Constitutional:  Positive for fatigue.  ?HENT:  Positive for mouth sores. Negative for mouth dryness and nose dryness.   ?Eyes:  Positive for dryness. Negative for pain and itching.  ?Respiratory:  Positive for shortness of breath. Negative for difficulty breathing.   ?Cardiovascular:  Negative for chest pain and palpitations.  ?Gastrointestinal:  Negative for blood in stool, constipation and diarrhea.  ?Endocrine: Negative for increased urination.  ?Genitourinary:  Negative for difficulty urinating.  ?Musculoskeletal:  Positive for joint pain, joint pain, joint swelling, muscle weakness and morning stiffness. Negative for myalgias, muscle tenderness and myalgias.  ?Skin:  Negative for color change, rash and redness.  ?Allergic/Immunologic: Negative for susceptible to infections.  ?Neurological:  Positive for numbness, memory loss and weakness. Negative for dizziness and headaches.  ?Hematological:  Negative for bruising/bleeding tendency.  ?Psychiatric/Behavioral:  Negative for confusion.   ? ?PMFS History:  ?Patient Active Problem List  ? Diagnosis Date Noted  ?? Dermatitis 06/27/2021  ?? Colitis 07/28/2018  ?? Preventative health care 05/04/2018  ?? Advance directive discussed with patient 05/04/2018  ?? RLS (restless legs syndrome) 04/25/2017  ?? Mood disorder ([place]) 04/08/2017  ?? Sensory loss 04/08/2017  ?? COPD (chronic obstructive pulmonary disease) ([place]) 04/08/2017  ?? Ankylosing spondylitis of lumbosacral region ([place]) 09/24/2016  ?? GAD (generalized anxiety disorder) 08/27/2016  ?? 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 Spondyloarthropathy HLA-B27  positive 06/06/2016  ?? Iritis 06/06/2016  ?? High risk medication use 06/06/2016  ?? Primary osteoarthritis of both hands 06/06/2016  ?? Smoker 06/06/2016  ?? Anxiety 04/10/2016  ?? Essential hypertension   ?  ?Past Medical History:  ?Diagnosis Date  ?? Ankylosing spondylitis ([place])   ?? Anxiety   ??  Arthritis   ?? Chronic lower back pain   ?? CKD (chronic kidney disease), stage II   ? "related to the Ankylosing spondylitis" (04/10/2016)  ?? COPD (chronic obstructive pulmonary disease) ([place])   ?? Eye disease   ?? Hypertension   ?? PONV (postoperative nausea and vomiting)   ? "didn't bother me the last time I had it in ~ 2015"  ?  ?Family History  ?Problem Relation Age of Onset  ?? Hypertension Mother   ?? Heart disease Sister   ?? Diabetes Sister   ?? Pulmonary fibrosis Brother   ?? Idiopathic pulmonary fibrosis Brother   ?? Alzheimer's disease Brother   ?? Dementia Brother   ?? Frontotemporal dementia Brother   ? ?Past Surgical History:  ?Procedure Laterality Date  ?? BROW LIFT AND BLEPHAROPLASTY Bilateral ~ 2015  ?? CATARACT EXTRACTION W/ INTRAOCULAR LENS  IMPLANT, BILATERAL Bilateral 2000s  ?? CYST REMOVAL TRUNK  12/2018  ?? MANDIBLE SURGERY  ~ 2000  ? "jaw had moved to left; broke my jaw in 5 places; put titanium in; stripped muscle from bone; wired jaw shut"  ?? TUBAL LIGATION  1979  ?? VAGINAL HYSTERECTOMY  1985  ? ?Social History  ? ?Social History Narrative  ? 2 sons--not in area  ? Lives on property with her mother [name]  ?   ? Has living will  ? Would want one of her sons, [name], to be Media planner  ? Requests DNR  ? No tube feeds  ? ?Immunization History  ?Administered Date(s) Administered  ?? Fluad Quad(high Dose 65+) 06/27/2021  ?? Influenza Inj Mdck Quad Pf 04/16/2018  ?? Influenza, High Dose Seasonal PF 04/09/2019, 02/22/2020  ?? Influenza-Unspecified 04/09/2019, 06/27/2021  ?? Moderna Sars-Covid-2 Vaccination 09/06/2019, 10/05/2019, 03/19/2020  ?? Pneumococcal Conjugate-13 05/10/2013  ?? Pneumococcal Polysaccharide-23 09/20/2014  ?? Tdap 04/30/2011  ?? Zoster Recombinat (Shingrix) 10/01/2016  ?? Zoster, Live 12/27/2016, 03/03/2017  ?  ? ?Objective: ?Vital Signs: BP 115/73 (BP Location: Left Arm, Patient Position: Sitting, Cuff Size: Normal)   Pulse (!) 102   Ht _0  (1.702 m)   Wt 153  lb 3.2 oz (69.5 kg)   BMI 23.99 kg/m?   ? ?Physical Exam ?Vitals and nursing note reviewed.  ?Constitutional:   ?   Appearance: She is well-developed.  ?HENT:  ?   Head: Normocephalic and atraumatic.  ?Eyes:  ?   Conjunctiva/sclera: Conjunctivae normal.  ?Cardiovascular:  ?   Rate and Rhythm: Normal rate and regular rhythm.  ?   Heart sounds: Normal heart sounds.  ?Pulmonary:  ?   Effort: Pulmonary effort is normal.  ?   Comments: Faint wheezing noted ?Abdominal:  ?   General: Bowel sounds are normal.  ?   Palpations: Abdomen is soft.  ?Musculoskeletal:  ?   Cervical back: Normal range of motion.  ?Skin: ?   General: Skin is warm and dry.  ?   Capillary Refill: Capillary refill takes less than 2 seconds.  ?Neurological:  ?   Mental Status: She is alert and oriented to person, place, and time.  ?Psychiatric:     ?   Behavior: Behavior normal.  ?  ? ?  Musculoskeletal Exam: C-spine has limited range of motion without rotation.  Thoracic kyphosis noted.  No midline spinal tenderness or SI joint tenderness.  Painful limited range of motion of the right shoulder to about 100 degrees.  Left shoulder has full range of motion with no discomfort.  Elbow joints have good range of motion.  Rheumatoid nodules palpable on extensor surface of both elbows.  Slightly limited extension of both wrist joints with synovial thickening.  Tenderness and synovitis of the right wrist.  Synovial thickening over bilateral second MCP joints.  PIP and DIP thickening consistent with osteoarthritis of both hands.  Tenderness and synovitis of the right second and third MCPs and the right second and fifth PIP joints.  Incomplete fist formation bilaterally.  Hip joints have good range of motion with no groin pain.  Painful range of motion of the left knee especially with full extension.  No warmth or effusion of knee joints noted.  Ankle joints have good range of motion with no tenderness or joint swelling. ? ?CDAI Exam: ?CDAI Score: 14.2  ?Patient  Global: 6 mm; Provider Global: 6 mm ?Swollen: 5 ; Tender: 8  ?Joint Exam 10/04/2021  ? ?   Right  Left  ?Glenohumeral   Tender     ?Wrist  Swollen Tender   Tender  ?MCP 2  Swollen Tender     ?MCP 3  Swollen Tender

## 2021-10-05 NOTE — Progress Notes (Signed)
Hgb is borderline low but has improved.  Rest of CBC WNL. Iron is low-30 and ferritin is elevated-488.  Please notify the patient and forward results to Dr. Silvio Pate as requested by the patient.

## 2021-10-08 ENCOUNTER — Telehealth: Payer: Self-pay

## 2021-10-08 MED ORDER — PREDNISONE 5 MG PO TABS: ORAL_TABLET | ORAL | 0 refills | Status: DC

## 2021-10-08 MED ORDER — "BD TB SYRINGE 27G X 1/2"" 1 ML MISC": 3 refills | Status: DC

## 2021-10-08 NOTE — Telephone Encounter (Signed)
Patient called stating she is still experiencing pain all over her body.   Patient is due to decrease to 1 tablet in a couple of days.  Patient requested a return call ASAP.   ?

## 2021-10-08 NOTE — Telephone Encounter (Signed)
Patient states the prednisone did not work so well this time. Patient states the pain has returned. Patient states the pain came back last night. Patient states it is total body pain. Patient states the pain is worse in hands, knees, shoulders and elbows. Patient states she is not having any back pain. Patient states she has not noticed any swelling. Patient would like to know what she can do and if she can take tylenol. Please advise.  ?

## 2021-10-08 NOTE — Telephone Encounter (Signed)
Please clarify if the injection alleviated her shoulder pain? Has she restarted humira and methotrexate yet? Ok to extend prednisone taper to tapering by 5 mg every 4 days instead of every 2 days.

## 2021-10-08 NOTE — Telephone Encounter (Signed)
Attempted to contact the patient and left message for patient to call the office.  

## 2021-10-08 NOTE — Telephone Encounter (Signed)
Spoke with patient and she states the injection helped some with her shoulder. Patient states she is unable to sleep on it. Patient states she restarted her MTX yesterday and plans on restarting her Humira today. Patient is requesting a prescription for syringes to be sent to the pharmacy.  ?

## 2021-10-09 ENCOUNTER — Telehealth: Payer: Self-pay | Admitting: Rheumatology

## 2021-10-09 ENCOUNTER — Other Ambulatory Visit: Payer: Self-pay

## 2021-10-09 DIAGNOSIS — M457 Ankylosing spondylitis of lumbosacral region: Secondary | ICD-10-CM

## 2021-10-09 LAB — CBC WITH DIFFERENTIAL/PLATELET
Absolute Monocytes: 495 cells/uL (ref 200–950)
Basophils Absolute: 20 cells/uL (ref 0–200)
Basophils Relative: 0.4 %
Eosinophils Absolute: 260 cells/uL (ref 15–500)
Eosinophils Relative: 5.1 %
HCT: 36.1 % (ref 35.0–45.0)
Hemoglobin: 11.6 g/dL — ABNORMAL LOW (ref 11.7–15.5)
Lymphs Abs: 1122 cells/uL (ref 850–3900)
MCH: 30.1 pg (ref 27.0–33.0)
MCHC: 32.1 g/dL (ref 32.0–36.0)
MCV: 93.8 fL (ref 80.0–100.0)
MPV: 10.7 fL (ref 7.5–12.5)
Monocytes Relative: 9.7 %
Neutro Abs: 3203 cells/uL (ref 1500–7800)
Neutrophils Relative %: 62.8 %
Platelets: 283 10*3/uL (ref 140–400)
RBC: 3.85 10*6/uL (ref 3.80–5.10)
RDW: 14.2 % (ref 11.0–15.0)
Total Lymphocyte: 22 %
WBC: 5.1 10*3/uL (ref 3.8–10.8)

## 2021-10-09 LAB — IRON,TIBC AND FERRITIN PANEL
%SAT: 11 % (calc) — ABNORMAL LOW (ref 16–45)
Ferritin: 488 ng/mL — ABNORMAL HIGH (ref 16–288)
Iron: 30 ug/dL — ABNORMAL LOW (ref 45–160)
TIBC: 266 mcg/dL (calc) (ref 250–450)

## 2021-10-09 LAB — QUANTIFERON-TB GOLD PLUS
Mitogen-NIL: 1.11 IU/mL
NIL: 0.06 IU/mL
QuantiFERON-TB Gold Plus: NEGATIVE
TB1-NIL: 0.03 IU/mL
TB2-NIL: 0.01 IU/mL

## 2021-10-09 NOTE — Telephone Encounter (Signed)
Spoke with pharmacy and they states the insurance will allow prescription to be filled tomorrow. Patient advised and she will pick up prescription tomorrow.  ?

## 2021-10-09 NOTE — Progress Notes (Signed)
TB gold negative

## 2021-10-09 NOTE — Telephone Encounter (Signed)
Patient called requesting prescription refill of Humira.   ?

## 2021-10-09 NOTE — Telephone Encounter (Signed)
Patient called the office stating she received a message from her pharmacy stating her Prednisone could not be fill because it was too early. Patient states she would like someone to call and get it sorted out.  ?

## 2021-10-09 NOTE — Telephone Encounter (Signed)
Next Visit: 11/26/2021 ? ?Last Visit: 10/04/2021 ? ?Last Fill: 07/27/2021 ? ?PT:[id number] arthritis of multiple sites with negative rheumatoid factor  ? ?Current Dose per office note 10/04/2021: Humira 40 mg subcutaneous injections every 14 days ? ?Labs: 10/04/2021, CBC, Hgb is borderline low but has improved.  Rest of CBC WNL. Iron is low-30 and ferritin is elevated-488.  Please notify the patient and forward results to Dr. Silvio Pate as requested by the patient 09/15/2021 CMP, Sodium 132, Potassium 3.2, Glucose 129, Calcium 8.6, AST 14,  ? ?TB Gold: 10/04/2021, negative  ? ?Okay to refill Humira? ? ?

## 2021-10-10 MED ORDER — HUMIRA (2 PEN) 40 MG/0.4ML ~~LOC~~ AJKT: 40.0000 mg | AUTO-INJECTOR | SUBCUTANEOUS | 0 refills | Status: DC

## 2021-10-12 ENCOUNTER — Other Ambulatory Visit: Payer: Medicare Other

## 2021-10-17 ENCOUNTER — Encounter: Payer: Self-pay | Admitting: Internal Medicine

## 2021-10-17 ENCOUNTER — Ambulatory Visit (INDEPENDENT_AMBULATORY_CARE_PROVIDER_SITE_OTHER): Payer: Medicare Other | Admitting: Internal Medicine

## 2021-10-17 DIAGNOSIS — D528 Other folate deficiency anemias: Secondary | ICD-10-CM

## 2021-10-17 DIAGNOSIS — D649 Anemia, unspecified: Secondary | ICD-10-CM | POA: Insufficient documentation

## 2021-10-17 NOTE — Assessment & Plan Note (Signed)
This is new over the past 3 months ?Just had EGD and colon--no obvious GI blood loss ?She admits to not taking the folic acid much---asked her to restart this ?Will recheck CBC with next blood work by Dr Patrecia Pour. Consider referral to hematology if anemia doesn't improve ? ?Phone contact for 8 minutes ?

## 2021-10-17 NOTE — Progress Notes (Signed)
? ?Subjective:  ? ? Patient ID: Kirsten Harper, female    DOB: Jul 19, 1945, 76 y.o.   MRN: [id number] ? ?HPI ?Virtual telephone visit to review recent GI testing ?Unable to establish video link ?Identification done ?Reviewed limitations and billing and she gave consent ?Participants---patient in her home and I am in my office ? ?Dr Patrecia Pour has been concerned about her overall status---especially with weight loss ?Has lost weight also ?She notes stress due to having to take care of her mother ? ?Anemia found since January ?Just had EGD and colonoscopy---just had 2-3 benign polyps ? 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 ?Current Outpatient Medications on File Prior to Visit  ?Medication Sig Dispense Refill  ? Adalimumab (HUMIRA PEN) 40 MG/0.4ML PNKT Inject 40 mg into the skin every 14 (fourteen) days. 3 each 0  ? albuterol (VENTOLIN HFA) 108 (90 Base) MCG/ACT inhaler INHALE 1 TO 2 PUFFS INTO THE LUNGS EVERY 6 HOURS AS NEEDED 18 each 0  ? ALPRAZolam (XANAX) 0.5 MG tablet Take 0.5 mg by mouth 2 (two) times daily.    ? amitriptyline (ELAVIL) 100 MG tablet Take 100 mg by mouth at bedtime.    ? amLODipine (NORVASC) 10 MG tablet Take 1 tablet (10 mg total) by mouth daily. 90 tablet 3  ? dorzolamide-timolol (COSOPT) 22.3-6.8 MG/ML ophthalmic solution Place 1 drop into both eyes 2 (two) times daily.     ? escitalopram (LEXAPRO) 10 MG tablet Take 10 mg by mouth daily.    ? fluticasone (FLONASE) 50 MCG/ACT nasal spray Place 1 spray into both nostrils daily.    ? folic acid (FOLVITE) 1 MG tablet TAKE 1 TABLET BY MOUTH EVERY DAY 90 tablet 3  ? gabapentin (NEURONTIN) 300 MG capsule Take 1-2 capsules (300-600 mg total) by mouth at bedtime. For restless legs 60 capsule 5  ? losartan (COZAAR) 100 MG tablet TAKE 1 TABLET BY MOUTH EVERY DAY 90 tablet 0  ? LOTEMAX 0.5 %  ophthalmic suspension Place 1 drop into both eyes 2 (two) times daily.     ? Methotrexate Sodium (METHOTREXATE, PF,) 50 MG/2ML injection INJECT 0.4 MLS (10 MG TOTAL) INTO THE SKIN ONCE A WEEK. 8 mL 2  ? predniSONE (DELTASONE) 5 MG tablet Take 4 tabs po x 4 days, 3  tabs po x 4 days, 2  tabs po x 4 days, 1  tab po x 4 days 40 tablet 0  ? TUBERCULIN SYR 1CC/27GX1/2" (B-D TB SYRINGE 1CC/27GX1/2") 27G X 1/2" 1 ML MISC 12 Syringes by Does not apply route once a week. 12 each 3  ? ?No current facility-administered medications on file prior to visit.  ? ? ?Allergies  ?Allergen Reactions  ? Amoxapine And Related Other (See Comments)  ?  (Asendin- "Tricyclic antidepressant") Patient reports it did not work for her  ? Seroquel [Quetiapine] Other (See Comments)  ?  Worsened restless legs  ? Other Palpitations and Other (See Comments)  ?  Psych med (name not recalled by the patient, but it "starts with a Z")- Worsened restless legs, also (NOT Zyprexa; I asked)  ? ? ?Past Medical History:  ?Diagnosis Date  ? Ankylosing spondylitis ([place])   ? Anxiety   ? Arthritis   ? Chronic lower back pain   ? CKD (chronic kidney disease), stage II   ? "related to the Ankylosing spondylitis" (04/10/2016)  ?  COPD (chronic obstructive pulmonary disease) ([place])   ? Eye disease   ? Hypertension   ? PONV (postoperative nausea and vomiting)   ? "didn't bother me the last time I had it in ~ 2015"  ? ? ?Past Surgical History:  ?Procedure Laterality Date  ? BROW LIFT AND BLEPHAROPLASTY Bilateral ~ 2015  ? CATARACT EXTRACTION W/ INTRAOCULAR LENS  IMPLANT, BILATERAL Bilateral 2000s  ? CYST REMOVAL TRUNK  12/2018  ? MANDIBLE SURGERY  ~ 2000  ? "jaw had moved to left; broke my jaw in 5 places; put titanium in; stripped muscle from bone; wired jaw shut"  ? TUBAL LIGATION  1979  ? VAGINAL HYSTERECTOMY  1985  ? ? ?Family History  ?Problem Relation Age of Onset  ? Hypertension Mother   ? Heart disease Sister   ? Diabetes Sister   ? Pulmonary fibrosis Brother   ?  Idiopathic pulmonary fibrosis Brother   ? Alzheimer's disease Brother   ? Dementia Brother   ? Frontotemporal dementia Brother   ? ? ?Social History  ? ?Socioeconomic History  ? Marital status: Divorced  ?  Spouse name: Not on file  ? Number of children: 2  ? Years of education: Not on file  ? Highest education level: Not on file  ?Occupational History  ? Occupation: Sales promotion account executive for [employer]  ?  Comment: Retired  ?Tobacco Use  ? Smoking status: Every Day  ?  Packs/day: 1.00  ?  Years: 51.00  ?  Pack years: 51.00  ?  Types: Cigarettes  ?  Last attempt to quit: 09/11/2021  ?  Years since quitting: 0.0  ? Smokeless tobacco: Never  ? Tobacco comments:  ?  1 cigarette daily  ?Vaping Use  ? Vaping Use: Never used  ?Substance and Sexual Activity  ? Alcohol use: Yes  ?  Alcohol/week: 0.0 standard drinks  ?  Comment: occ  ? Drug use: No  ? Sexual activity: Never  ?Other Topics Concern  ? Not on file  ?Social History Narrative  ? 2 sons--not in area  ? Lives on property with her mother [name]  ?   ? Has living will  ? Would want one of her sons, [name], to be Media planner  ? Requests DNR  ? No tube feeds  ? ?Social Determinants of Health  ? ?Financial Resource Strain: Not on file  ?Food Insecurity: Not on file  ?Transportation Needs: Not on file  ?Physical Activity: Not on file  ?Stress: Not on file  ?Social Connections: Not on file  ?Intimate Partner Violence: Not on file  ? ?Review of Systems ? ?   ?Objective:  ? Physical Exam ?Constitutional:   ?   Appearance: Normal appearance.  ?   Comments: Sounds fine  ?Neurological:  ?   Mental Status: She is alert.  ?  ? ? ? ? ?   ?Assessment & Plan:  ? ?

## 2021-10-21 ENCOUNTER — Telehealth: Payer: Self-pay | Admitting: Rheumatology

## 2021-10-21 NOTE — Telephone Encounter (Signed)
I received a phone call from the patient.  I returned her phone call.  Patient stated that she is still taking prednisone 10 mg a day.  She has no joint swelling but she has been having a lot of discomfort in her knee joints.  She stated that she has been taking care of her mother which is very hard on her joints.  She will be able to take care of her mother anymore.  She feels like that she has to rest or she will have to go into a nursing home.  She wanted to know if she can take Tylenol.  I advised her to take Tylenol 500 mg p.o. every 6 hours as needed.  Side effects of Tylenol were discussed.  I also advised her to contact us in case her symptoms do not improve.  Patient voiced understanding. ?Bo Merino, MD  ?

## 2021-10-22 ENCOUNTER — Telehealth: Payer: Self-pay | Admitting: Rheumatology

## 2021-10-22 ENCOUNTER — Telehealth: Payer: Self-pay

## 2021-10-22 ENCOUNTER — Other Ambulatory Visit: Payer: Self-pay | Admitting: Internal Medicine

## 2021-10-22 NOTE — Telephone Encounter (Signed)
Gabapentin does not interact with Humira or methotrexate.

## 2021-10-22 NOTE — Telephone Encounter (Signed)
Patient is currently taking Humira 40 mg subcutaneous injections every 14 days, methotrexate 0.4 mL subcutaneous injections once weekly, and folic acid 1 mg daily. Please advise.  ?

## 2021-10-22 NOTE — Telephone Encounter (Signed)
Patient called the office stating she was unable to get syringes for her Methotrexate at her normal pharmacy and had to get them at a different [facility]. Patient states every time she get her syringes they're different and she is guessing at the amount of MTX she is getting. Patient states she cant tell how much she is getting. Patient states she would like a call back.  ?

## 2021-10-22 NOTE — Telephone Encounter (Signed)
Returned call to patient regarding methotrexate injections. She was able to read out what her current syringe supply states. She read 0.1, 0.2, 0.3, 0.4 mL aloud - I advised that she is draw to 0.4 mL on syringe. I also recommended that she ensure she check syringe prescriptions when she picks up refills - if the syringe is unfamiliar, she should always ask the pharmacist at her local pharmacy to point out where 0.4 mL falls on syringe. She verbalized understanding. ? ?Knox Saliva, PharmD, MPH, BCPS ?Clinical Pharmacist (Rheumatology and Pulmonology) ?

## 2021-10-22 NOTE — Telephone Encounter (Signed)
Patient called to let Dr. Estanislado Pandy know that her PCP Dr. Silvio Pate prescribed Gabapentin and wanted to make sure it was okay to take with the other medications she is prescribed.   ?

## 2021-10-23 NOTE — Telephone Encounter (Signed)
LMOM

## 2021-10-30 ENCOUNTER — Other Ambulatory Visit: Payer: Self-pay | Admitting: Rheumatology

## 2021-10-30 MED ORDER — PREDNISONE 5 MG PO TABS: ORAL_TABLET | ORAL | 0 refills | Status: DC

## 2021-10-30 NOTE — Telephone Encounter (Signed)
I spoke with Dr. Estanislado Pandy.  Ok to send in prednisone 20 mg tapering by 5 mg every week.   ?If she continues to have recurrent flares despite being on humira and MTX consistently we will need to discuss other treatment options.

## 2021-10-30 NOTE — Telephone Encounter (Signed)
Patient states she is in pain all over. Patient states she does not have strength in her arms. Patient states she is having trouble washing her hair or opening cans of cat food or coke cans. Patient states she is having trouble lifting. Patient states she has trouble getting up and walking up and down steps. Patient has trouble sitting up. Patient states she taking Gabapentin and Tylenol with little relief. Patient is on Humira and MTX. Patient states she has had times where her medication has been delayed due to not arriving on time. Patient states she has taken Prednisone in the past which helped. Patient states 20 mg has helped the best. Please advise.  ?

## 2021-10-30 NOTE — Telephone Encounter (Signed)
Patient called the office stating she is in pain all over with no upper body strength. Patient states she has taken tylenol and gabapentin for pain. Patient states prednisone helps. Patient requests a return call with what she can do to help. ?

## 2021-10-30 NOTE — Telephone Encounter (Signed)
Patient advised Kirsten Harper spoke with Dr. Estanislado Pandy.  Ok to send in prednisone 20 mg tapering by 5 mg every week.   ?If she continues to have recurrent flares despite being on humira and MTX consistently we will need to discuss other treatment options. Patient expressed understanding.  ?

## 2021-10-31 NOTE — Progress Notes (Signed)
? ?Office Visit Note ? ?Patient: Kirsten Harper             ?Date of Birth: 11-04-1945           ?MRN: [id number]             ?PCP: Venia Carbon, MD ?Referring: Venia Carbon, MD ?Visit Date: 11/01/2021 ?Occupation: @GUAROCC @ ? ?Subjective:  ?Pain in multiple joints ? ?History of Present Illness: Kirsten Harper is a 76 y.o. female with history of seronegative rheumatoid arthritis and uveitis.  She was seen today on urgent basis.  Patient called about 2 weeks ago with severe pain and discomfort in multiple joints.  She has not noticed any joint swelling but she states her pain is severe.  When she gets on the prednisone the pain improves.  She states she has missed some doses of Humira intermittently as she cannot get labs on time.  She has been taking methotrexate on a regular basis.  She has not had a flare of uveitis on methotrexate and Humira combination.  She states her eyes continue to water.  She has been under a lot of stress as her brother has terminal illness and she is taking care of her mother.  She is currently on prednisone 20 mg p.o. daily.  She states she is feeling much better on the high-dose prednisone.  She states her last Humira injection was 3 days ago.  She missed a month of Humira prior to it. ? ?Activities of Daily Living:  ?Patient reports morning stiffness for all day. ?Patient Reports nocturnal pain.  ?Difficulty dressing/grooming: Reports ?Difficulty climbing stairs: Reports ?Difficulty getting out of chair: Reports ?Difficulty using hands for taps, buttons, cutlery, and/or writing: Reports ? ?Review of Systems  ?Constitutional:  Positive for fatigue.  ?HENT:  Negative for mouth sores, mouth dryness and nose dryness.   ?Eyes:  Negative for pain, itching and dryness.  ?Respiratory:  Negative for shortness of breath and difficulty breathing.   ?Cardiovascular:  Negative for chest pain and palpitations.  ?Gastrointestinal:  Negative for blood in stool, constipation and diarrhea.   ?Endocrine: Negative for increased urination.  ?Genitourinary:  Negative for difficulty urinating.  ?Musculoskeletal:  Positive for joint pain, joint pain and morning stiffness. Negative for joint swelling, myalgias, muscle tenderness and myalgias.  ?Skin:  Negative for color change, rash and redness.  ?Allergic/Immunologic: Negative for susceptible to infections.  ?Neurological:  Positive for dizziness and weakness. Negative for numbness, headaches and memory loss.  ?Hematological:  Negative for bruising/bleeding tendency.  ?Psychiatric/Behavioral:  Negative for confusion.   ? ?PMFS History:  ?Patient Active Problem List  ? Diagnosis Date Noted  ? Rheumatoid arthritis of multiple sites with negative rheumatoid factor ([place]) 11/01/2021  ? Anemia 10/17/2021  ? Dermatitis 06/27/2021  ? Colitis 07/28/2018  ? Preventative health care 05/04/2018  ? Advance directive discussed with patient 05/04/2018  ? RLS (restless legs syndrome) 04/25/2017  ? Mood disorder ([place]) 04/08/2017  ? Sensory loss 04/08/2017  ? COPD (chronic obstructive pulmonary disease) ([place]) 04/08/2017  ? GAD (generalized anxiety disorder) 08/27/2016  ? 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 Spondyloarthropathy HLA-B27 positive 06/06/2016  ? Iritis 06/06/2016  ? High risk medication use 06/06/2016  ? Primary osteoarthritis of both hands 06/06/2016  ? Smoker 06/06/2016  ? Anxiety 04/10/2016  ? Essential hypertension   ?  ?Past Medical History:  ?Diagnosis Date  ? Ankylosing spondylitis ([place])   ? Anxiety   ? Arthritis   ?  Chronic lower back pain   ? CKD (chronic kidney disease), stage II   ? "related to the Ankylosing spondylitis" (04/10/2016)  ? COPD (chronic obstructive pulmonary disease) ([place])   ? Eye disease   ? Hypertension   ? PONV (postoperative nausea and vomiting)   ? "didn't bother me the last time I had it in ~ 2015"  ?  ?Family History  ?Problem Relation Age of Onset  ? Hypertension Mother   ? Heart disease Sister   ?  Diabetes Sister   ? Pulmonary fibrosis Brother   ? Idiopathic pulmonary fibrosis Brother   ? Alzheimer's disease Brother   ? Dementia Brother   ? Frontotemporal dementia Brother   ? ?Past Surgical History:  ?Procedure Laterality Date  ? BROW LIFT AND BLEPHAROPLASTY Bilateral ~ 2015  ? CATARACT EXTRACTION W/ INTRAOCULAR LENS  IMPLANT, BILATERAL Bilateral 2000s  ? CYST REMOVAL TRUNK  12/2018  ? MANDIBLE SURGERY  ~ 2000  ? "jaw had moved to left; broke my jaw in 5 places; put titanium in; stripped muscle from bone; wired jaw shut"  ? TUBAL LIGATION  1979  ? VAGINAL HYSTERECTOMY  1985  ? ?Social History  ? ?Social History Narrative  ? 2 sons--not in area  ? Lives on property with her mother Kirsten Harper  ?   ? Has living will  ? Would want one of her sons, Kirsten Harper, to be Media planner  ? Requests DNR  ? No tube feeds  ? ?Immunization History  ?Administered Date(s) Administered  ? Fluad Quad(high Dose 65+) 06/27/2021  ? Influenza Inj Mdck Quad Pf 04/16/2018  ? Influenza, High Dose Seasonal PF 04/09/2019, 02/22/2020  ? Influenza-Unspecified 04/09/2019, 06/27/2021  ? Moderna Sars-Covid-2 Vaccination 09/06/2019, 10/05/2019, 03/19/2020  ? Pneumococcal Conjugate-13 05/10/2013  ? Pneumococcal Polysaccharide-23 09/20/2014  ? Tdap 04/30/2011  ? Zoster Recombinat (Shingrix) 10/01/2016  ? Zoster, Live 12/27/2016, 03/03/2017  ?  ? ?Objective: ?Vital Signs: BP 133/71 (BP Location: Left Arm, Patient Position: Sitting, Cuff Size: Normal)   Pulse 80   Ht 5' 7.5" (1.715 m)   Wt 148 lb 9.6 oz (67.4 kg)   BMI 22.93 kg/m?   ? ?Physical Exam ?Vitals and nursing note reviewed.  ?Constitutional:   ?   Appearance: She is well-developed.  ?HENT:  ?   Head: Normocephalic and atraumatic.  ?Eyes:  ?   Conjunctiva/sclera: Conjunctivae normal.  ?Cardiovascular:  ?   Rate and Rhythm: Normal rate and regular rhythm.  ?   Heart sounds: Normal heart sounds.  ?Pulmonary:  ?   Effort: Pulmonary effort is normal.  ?   Breath sounds: Normal breath sounds.   ?Abdominal:  ?   General: Bowel sounds are normal.  ?   Palpations: Abdomen is soft.  ?Musculoskeletal:  ?   Cervical back: Normal range of motion.  ?Lymphadenopathy:  ?   Cervical: No cervical adenopathy.  ?Skin: ?   General: Skin is warm and dry.  ?   Capillary Refill: Capillary refill takes less than 2 seconds.  ?Neurological:  ?   Mental Status: She is alert and oriented to person, place, and time.  ?Psychiatric:     ?   Behavior: Behavior normal.  ?  ? ?Musculoskeletal Exam: C-spine was in good range of motion.  Shoulder joints with good range of motion with some discomfort in her right shoulder.  She had about 5 degree contracture in the right elbow with no synovitis.  She has synovitis in her right wrist joint and  some of the MCPs and PIPs as described below.  Hip joints with good range of motion.  Bilateral knee joints were warm to touch.  There was no swelling over ankles or MTPs. ? ?CDAI Exam: ?CDAI Score: 12.6  ?Patient Global: 2 mm; Provider Global: 4 mm ?Swollen: 6 ; Tender: 6  ?Joint Exam 11/01/2021  ? ?   Right  Left  ?Wrist  Swollen Tender     ?MCP 2  Swollen Tender  Swollen Tender  ?MCP 3  Swollen Tender     ?Knee  Swollen Tender  Swollen Tender  ? ? ? ?Investigation: ?No additional findings. ? ?Imaging: ?No results found. ? ?Recent Labs: ?Lab Results  ?Component Value Date  ? WBC 5.1 10/04/2021  ? HGB 11.6 (L) 10/04/2021  ? PLT 283 10/04/2021  ? NA 135 09/25/2021  ? K 4.0 09/25/2021  ? CL 102 09/25/2021  ? CO2 27 09/25/2021  ? GLUCOSE 93 09/25/2021  ? BUN 9 09/25/2021  ? CREATININE 0.78 09/25/2021  ? BILITOT 1.0 09/25/2021  ? ALKPHOS 53 09/25/2021  ? AST 17 09/25/2021  ? ALT 11 09/25/2021  ? PROT 7.1 09/25/2021  ? ALBUMIN 3.8 09/25/2021  ? ALBUMIN 3.8 09/25/2021  ? CALCIUM 9.6 09/25/2021  ? GFRAA 92 11/06/2020  ? QFTBGOLDPLUS NEGATIVE 10/04/2021  ? ? ?Speciality Comments: Methotrexate-nausea, Arava started March 21, 2020-diarrhea ?Humira January 22, 2017 ? ?Procedures:  ?No procedures  performed ?Allergies: Amoxapine and related, Seroquel [quetiapine], and Other  ? ?Assessment / Plan:     ?Visit Diagnoses: Rheumatoid arthritis of multiple sites with negative rheumatoid factor ([place]) -patient states she continue

## 2021-11-01 ENCOUNTER — Encounter: Payer: Self-pay | Admitting: Rheumatology

## 2021-11-01 ENCOUNTER — Ambulatory Visit (INDEPENDENT_AMBULATORY_CARE_PROVIDER_SITE_OTHER): Payer: Medicare Other | Admitting: Rheumatology

## 2021-11-01 VITALS — BP 133/71 | HR 80 | Ht 67.5 in | Wt 148.6 lb

## 2021-11-01 DIAGNOSIS — H209 Unspecified iridocyclitis: Secondary | ICD-10-CM | POA: Diagnosis not present

## 2021-11-01 DIAGNOSIS — Z87891 Personal history of nicotine dependence: Secondary | ICD-10-CM

## 2021-11-01 DIAGNOSIS — Z8709 Personal history of other diseases of the respiratory system: Secondary | ICD-10-CM

## 2021-11-01 DIAGNOSIS — D649 Anemia, unspecified: Secondary | ICD-10-CM

## 2021-11-01 DIAGNOSIS — M5136 Other intervertebral disc degeneration, lumbar region: Secondary | ICD-10-CM

## 2021-11-01 DIAGNOSIS — Z8659 Personal history of other mental and behavioral disorders: Secondary | ICD-10-CM

## 2021-11-01 DIAGNOSIS — M0630 Rheumatoid nodule, unspecified site: Secondary | ICD-10-CM | POA: Diagnosis not present

## 2021-11-01 DIAGNOSIS — M0609 Rheumatoid arthritis without rheumatoid factor, multiple sites: Secondary | ICD-10-CM

## 2021-11-01 DIAGNOSIS — M47819 Spondylosis without myelopathy or radiculopathy, site unspecified: Secondary | ICD-10-CM

## 2021-11-01 DIAGNOSIS — Z9181 History of falling: Secondary | ICD-10-CM

## 2021-11-01 DIAGNOSIS — M19041 Primary osteoarthritis, right hand: Secondary | ICD-10-CM

## 2021-11-01 DIAGNOSIS — Z79899 Other long term (current) drug therapy: Secondary | ICD-10-CM | POA: Diagnosis not present

## 2021-11-01 DIAGNOSIS — Z8679 Personal history of other diseases of the circulatory system: Secondary | ICD-10-CM

## 2021-11-01 DIAGNOSIS — M19042 Primary osteoarthritis, left hand: Secondary | ICD-10-CM

## 2021-11-01 DIAGNOSIS — M170 Bilateral primary osteoarthritis of knee: Secondary | ICD-10-CM

## 2021-11-01 NOTE — Patient Instructions (Addendum)
?  Please take 1 injection of Humira every week for the next 4 weeks and then you will go back to Humira ?1 injection every other week after 4 weeks ? ?Standing Labs ?We placed an order today for your standing lab work.  ? ?Please have your standing labs drawn in June and every 3 months ? ?If possible, please have your labs drawn 2 weeks prior to your appointment so that the provider can discuss your results at your appointment. ? ?Please note that you may see your imaging and lab results in [facility] before we have reviewed them. ?We may be awaiting multiple results to interpret others before contacting you. ?Please allow our office up to 72 hours to thoroughly review all of the results before contacting the office for clarification of your results. ? ?We have open lab daily: ?Monday through Thursday from 1:30-4:30 PM and Friday from 1:30-4:00 PM ?at the office of Dr. Bo Merino, [facility].   ?Please be advised, all patients with office appointments requiring lab work will take precedent over walk-in lab work.  ?If possible, please come for your lab work on Monday and Friday afternoons, as you may experience shorter wait times. ?The office is located at [address] [place], [place], [place] [address] ?No appointment is necessary.   ?Labs are drawn by [facility]. Please bring your co-pay at the time of your lab draw.  You may receive a bill from [facility] for your lab work. ? ?Please note if you are on Hydroxychloroquine and and an order has been placed for a Hydroxychloroquine level, you will need to have it drawn 4 hours or more after your last dose. ? ?If you wish to have your labs drawn at another location, please call the office 24 hours in advance to send orders. ? ?If you have any questions regarding directions or hours of operation,  ?please call [phone].   ?As a reminder, please drink plenty of water prior to coming for your lab work. Thanks!  ? ?If you have signs or symptoms of an  infection or start antibiotics: ?First, call your PCP for workup of your infection. ?Hold your medication through the infection, until you complete your antibiotics, and until symptoms resolve if you take the following: ?Injectable medication (Actemra, Benlysta, Cimzia, Cosentyx, Enbrel, Humira, Kevzara, Orencia, Remicade, Simponi, [place], [place], [place]) ?Methotrexate ?Leflunomide Jolee Ewing) ?Mycophenolate (Cellcept) ?Roma Kayser, or Rinvoq  ?If you have signs or symptoms of an infection or start antibiotics: ?First, call your PCP for workup of your infection. ?Hold your medication through the infection, until you complete your antibiotics, and until symptoms resolve if you take the following: ?Injectable medication (Actemra, Benlysta, Cimzia, Cosentyx, Enbrel, Humira, Kevzara, Orencia, Remicade, Simponi, [place], [place], [place]) ?Methotrexate ?Leflunomide Jolee Ewing) ?Mycophenolate (Cellcept) ?Roma Kayser, or Rinvoq  ?

## 2021-11-01 NOTE — Progress Notes (Signed)
Medication Samples have been provided to the patient. ? ?Drug name: Humira       Strength: '40mg'$ /0.68m        Qty: 2  LOT:: [id number] Exp.Date: 11/12/2022 ? ?Dosing instructions: Inject 1 pen into the skin once weekly for 4 weeks.   ?

## 2021-11-14 ENCOUNTER — Other Ambulatory Visit: Payer: Self-pay | Admitting: Internal Medicine

## 2021-11-17 ENCOUNTER — Other Ambulatory Visit: Payer: Self-pay | Admitting: Physician Assistant

## 2021-11-17 DIAGNOSIS — M0609 Rheumatoid arthritis without rheumatoid factor, multiple sites: Secondary | ICD-10-CM

## 2021-11-17 DIAGNOSIS — M0630 Rheumatoid nodule, unspecified site: Secondary | ICD-10-CM

## 2021-11-19 NOTE — Telephone Encounter (Signed)
Next Visit: 01/01/2022 ? ?Last Visit: 11/01/2021 ? ?Last Fill: 08/21/2021 ? ?DX: Rheumatoid arthritis of multiple sites with negative rheumatoid factor  ? ?Current Dose per office note 11/01/2021: methotrexate 0.4 mL subcutaneous injections once weekly ? ?Labs: 10/04/2021 Hgb is borderline low but has improved.  Rest of CBC WNL 09/25/2021 Hepatic Function Panel and Renal Function Panel WNL ? ?Okay to refill MTX?  ?

## 2021-11-26 ENCOUNTER — Ambulatory Visit: Payer: Medicare Other | Admitting: Physician Assistant

## 2021-12-02 ENCOUNTER — Other Ambulatory Visit: Payer: Self-pay | Admitting: Internal Medicine

## 2021-12-09 ENCOUNTER — Other Ambulatory Visit: Payer: Self-pay | Admitting: Internal Medicine

## 2021-12-19 NOTE — Progress Notes (Unsigned)
Office Visit Note  Patient: Kirsten Harper             Date of Birth: 1945-08-29           MRN: [id number]             PCP: Venia Carbon, MD Referring: Venia Carbon, MD Visit Date: 01/01/2022 Occupation: @GUAROCC @  Subjective:  Medication monitoring   History of Present Illness: Kirsten Harper is a 76 y.o. female with history of seronegative rheumatoid arthritis, uveitis, and osteoarthritis. She is prescribed Humira 40 mg subcutaneous injections every 14 days, methotrexate 0.4 mL subcutaneous injections once weekly, and folic acid 1 mg daily.  At her last office visit on 11/01/2021 she was advised to increase the frequency of Humira dosing to once weekly for 1 month.  She noticed improvement in her joint pain and inflammation on the increased frequency of Humira dosing.  She denies any infections on the increased dose.  Her ROM and mobility have improved.  She has been taking tylenol as needed for pain relief.    Activities of Daily Living:  Patient reports joint stiffness all day  Patient Reports nocturnal pain.  Difficulty dressing/grooming: Reports Difficulty climbing stairs: Reports Difficulty getting out of chair: Reports Difficulty using hands for taps, buttons, cutlery, and/or writing: Reports  Review of Systems  Constitutional:  Positive for fatigue.  HENT:  Negative for mouth sores, mouth dryness and nose dryness.   Eyes:  Positive for dryness. Negative for pain and visual disturbance.  Respiratory:  Negative for cough, hemoptysis, shortness of breath and difficulty breathing.   Cardiovascular:  Negative for chest pain, palpitations, hypertension and swelling in legs/feet.  Gastrointestinal:  Negative for blood in stool, constipation and diarrhea.  Endocrine: Negative for excessive thirst and increased urination.  Genitourinary:  Negative for difficulty urinating and painful urination.  Musculoskeletal:  Positive for joint pain, gait problem, joint pain, joint  swelling, muscle weakness and morning stiffness. Negative for myalgias, muscle tenderness and myalgias.  Skin:  Negative for color change, pallor, rash, hair loss, nodules/bumps, skin tightness, ulcers and sensitivity to sunlight.  Allergic/Immunologic: Negative for susceptible to infections.  Neurological:  Positive for numbness and weakness. Negative for dizziness and headaches.  Hematological:  Negative for bruising/bleeding tendency and swollen glands.  Psychiatric/Behavioral:  Negative for depressed mood and sleep disturbance. The patient is not nervous/anxious.     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/2016   Spondyloarthropathy HLA-B27 positive 06/06/2016   Iritis 06/06/2016   High risk medication use 06/06/2016   Primary osteoarthritis of both hands 06/06/2016   Smoker 06/06/2016   Anxiety 04/10/2016   Essential hypertension     Past Medical History:  Diagnosis Date   Ankylosing spondylitis (HCC)    Anxiety    Arthritis    Chronic lower back pain    CKD (chronic kidney disease), stage II    "related to the Ankylosing spondylitis" (04/10/2016)   COPD (chronic obstructive pulmonary disease) ([place])  Eye disease    Hypertension    PONV (postoperative nausea and vomiting)    "didn't bother me the last time I had it in ~ 2015"    Family History  Problem Relation Age of Onset   Hypertension Mother    Heart disease Sister    Diabetes  Sister    Pulmonary fibrosis Brother    Idiopathic pulmonary fibrosis Brother    Alzheimer's disease Brother    Dementia Brother    Frontotemporal dementia Brother    Past Surgical History:  Procedure Laterality Date   BROW LIFT AND BLEPHAROPLASTY Bilateral ~ 2015   CATARACT EXTRACTION W/ INTRAOCULAR LENS  IMPLANT, BILATERAL Bilateral 2000s   CYST REMOVAL TRUNK  12/2018   MANDIBLE SURGERY  ~ 2000   "jaw had moved to left; broke my jaw in 5 places; put titanium in; stripped muscle from bone; wired jaw shut"   [place]   Social History   Social History Narrative   2 sons--not in area   Lives on property with her mother [name]      Has living will   Would want one of her sons, [name], to be Media planner   Requests DNR   No tube feeds   Immunization History  Administered Date(s) Administered   Fluad Quad(high Dose 65+) 06/27/2021   Influenza Inj Mdck Quad Pf 04/16/2018   Influenza, High Dose Seasonal PF 04/09/2019, 02/22/2020   Influenza-Unspecified 04/09/2019, 06/27/2021   Moderna Sars-Covid-2 Vaccination 09/06/2019, 10/05/2019, 03/19/2020   Pneumococcal Conjugate-13 05/10/2013   Pneumococcal Polysaccharide-23 09/20/2014   Tdap 04/30/2011   Zoster Recombinat (Shingrix) 10/01/2016   Zoster, Live 12/27/2016, 03/03/2017     Objective: Vital Signs: BP 107/61 (BP Location: Left Arm, Patient Position: Sitting, Cuff Size: Normal)   Pulse 79   Resp 16   Ht $R'5\' 7"'Bd$  (1.702 m)   Wt 145 lb (65.8 kg)   BMI 22.71 kg/m    Physical Exam Vitals and nursing note reviewed.  Constitutional:      Appearance: She is well-developed.  HENT:     Head: Normocephalic and atraumatic.  Eyes:     Conjunctiva/sclera: Conjunctivae normal.  Cardiovascular:     Rate and Rhythm: Normal rate and regular rhythm.     Heart sounds: Normal heart sounds.  Pulmonary:     Effort: Pulmonary effort is normal.     Breath sounds: Normal breath sounds.   Abdominal:     General: Bowel sounds are normal.     Palpations: Abdomen is soft.  Musculoskeletal:     Cervical back: Normal range of motion.  Skin:    General: Skin is warm and dry.     Capillary Refill: Capillary refill takes less than 2 seconds.  Neurological:     Mental Status: She is alert and oriented to person, place, and time.  Psychiatric:        Behavior: Behavior normal.      Musculoskeletal Exam: C-spine has good ROM.  Shoulder joints have slightly limited abduction to about 120 degrees.  5 degree flexion contracture of the right elbow noted.  No tenderness or synovitis of the wrist joints.  Slightly limited extension of both wrist joints noted.  Synovial thickening of MCP joints but no tenderness or synovitis.  PIP and DIP thickening consistent with osteoarthritis of both hands.   Hip joints have good range of motion with no groin pain.  Knee joints have good range of motion  with no warmth or effusion.  Ankle joints have good range of motion with no tenderness or joint swelling.  CDAI Exam: CDAI Score: -- Patient Global: --; Provider Global: -- Swollen: --; Tender: -- Joint Exam 01/01/2022   No joint exam has been documented for this visit   There is currently no information documented on the homunculus. Go to the Rheumatology activity and complete the homunculus joint exam.  Investigation: No additional findings.  Imaging: No results found.  Recent Labs: Lab Results  Component Value Date   WBC 5.1 10/04/2021   HGB 11.6 (L) 10/04/2021   PLT 283 10/04/2021   NA 135 09/25/2021   K 4.0 09/25/2021   CL 102 09/25/2021   CO2 27 09/25/2021   GLUCOSE 93 09/25/2021   BUN 9 09/25/2021   CREATININE 0.78 09/25/2021   BILITOT 1.0 09/25/2021   ALKPHOS 53 09/25/2021   AST 17 09/25/2021   ALT 11 09/25/2021   PROT 7.1 09/25/2021   ALBUMIN 3.8 09/25/2021   ALBUMIN 3.8 09/25/2021   CALCIUM 9.6 09/25/2021   GFRAA 92 11/06/2020   QFTBGOLDPLUS NEGATIVE 10/04/2021     Speciality Comments: Methotrexate-nausea, [place] started March 21, 2020-diarrhea Humira January 22, 2017    Follow-up every 3 months with labs  Procedures:  No procedures performed Allergies: Amoxapine and related, Seroquel [quetiapine], and Other   Assessment / Plan:     Visit Diagnoses: Rheumatoid arthritis of multiple sites with negative rheumatoid factor (HCC): Her joint pain, stiffness, and inflammation have improved significantly since her last office visit on 11/01/2021.  Her overall range of motion and mobility have improved since remaining on Humira and methotrexate without any gaps in therapy.  At her last office visit the plan was to increase Humira dosing to once weekly for 4 weeks and then resume injections every other week as prescribed.  She noticed improvement with increased frequency of Humira dosing.  She will remain on Humira 40 mg subcutaneous injections once every 14 days and methotrexate 0.4 mL subcu injections once weekly.  She was encouraged to continue to avoid the use of NSAIDs.  She was advised to notify us if she develops signs or symptoms of a flare.  She will follow-up in the office in 3 months or sooner if needed.  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  Uveitis - HLA-B27 positive-followed by Dr. Sherryll Burger.  She continues to have chronic dry eyes but has not had any signs or symptoms of uveitis flare on the current treatment regimen.  She will remain on Humira and methotrexate as prescribed.  High risk medication use - Humira 40 mg subcutaneous injections every 14 days, methotrexate 0.4 mL subcutaneous injections once weekly, and folic acid 1 mg daily. - Plan: CBC with Differential/Platelet, COMPLETE METABOLIC PANEL WITH GFR TB gold negative on 10/04/21.  CBC updated on 10/04/21. Renal function panel and hepatic function panel updated on 09/25/21.   Orders for CBC and CMP released today.  Her next lab work will  be due in September and every 3 months.  Standing orders for CBC and CMP remain in place.  Discussed the importance of holding methotrexate and humira if she develops signs or symptoms of an infection and to resume once the infection has completely cleared.  Discussed the importance of yearly skin exams while on humira due to the increased risk for skin cancer.   Hemoglobin low - Iron panel updated today. Plan: Iron, TIBC and Ferritin Panel  Primary osteoarthritis of both hands: She has PIP and DIP thickening consistent with osteoarthritis of both hands.   Primary osteoarthritis of both knees: She has good ROM of both knee joints with no discomfort today.  No warmth or effusion noted.  Her mobility has improved since resuming humira and methotrexate as prescribed.   DDD (degenerative disc disease), lumbar: Chronic pain and stiffness.   HLA B 27 Positive   Other medical conditions are listed as follows:   History of COPD -  Previous COPD exacerbation requiring a course of doxycycline prescribed on 09/15/21 at the ED. she held Humira and methotrexate while taking doxycycline.   History of recent fall  History of hypertension: BP was 107/61 today in the office.   History of anxiety  Former smoker  Orders: Orders Placed This Encounter  Procedures   CBC with Differential/Platelet   COMPLETE METABOLIC PANEL WITH GFR   Iron, TIBC and Ferritin Panel   Meds ordered this encounter  Medications   Adalimumab (HUMIRA PEN) 40 MG/0.4ML PNKT    Sig: Inject 40 mg into the skin every 14 (fourteen) days.    Dispense:  3 each    Refill:  0    3 kit-6 pens     Follow-Up Instructions: Return in about 3 months (around 04/03/2022) for Rheumatoid arthritis, Uveitis, Osteoarthritis.   Ofilia Neas, PA-C  Note - This record has been created using Dragon software.  Chart creation errors have been sought, but may not always  have been located. Such creation errors do not reflect on  the standard  of medical care.

## 2021-12-29 ENCOUNTER — Other Ambulatory Visit: Payer: Self-pay | Admitting: Internal Medicine

## 2022-01-01 ENCOUNTER — Encounter: Payer: Self-pay | Admitting: Physician Assistant

## 2022-01-01 ENCOUNTER — Ambulatory Visit (INDEPENDENT_AMBULATORY_CARE_PROVIDER_SITE_OTHER): Payer: Medicare Other | Admitting: Physician Assistant

## 2022-01-01 VITALS — BP 107/61 | HR 79 | Resp 16 | Ht 67.0 in | Wt 145.0 lb

## 2022-01-01 DIAGNOSIS — D649 Anemia, unspecified: Secondary | ICD-10-CM

## 2022-01-01 DIAGNOSIS — Z8679 Personal history of other diseases of the circulatory system: Secondary | ICD-10-CM

## 2022-01-01 DIAGNOSIS — M0609 Rheumatoid arthritis without rheumatoid factor, multiple sites: Secondary | ICD-10-CM

## 2022-01-01 DIAGNOSIS — M47819 Spondylosis without myelopathy or radiculopathy, site unspecified: Secondary | ICD-10-CM

## 2022-01-01 DIAGNOSIS — H209 Unspecified iridocyclitis: Secondary | ICD-10-CM

## 2022-01-01 DIAGNOSIS — M5136 Other intervertebral disc degeneration, lumbar region: Secondary | ICD-10-CM

## 2022-01-01 DIAGNOSIS — Z9181 History of falling: Secondary | ICD-10-CM

## 2022-01-01 DIAGNOSIS — Z79899 Other long term (current) drug therapy: Secondary | ICD-10-CM

## 2022-01-01 DIAGNOSIS — Z8709 Personal history of other diseases of the respiratory system: Secondary | ICD-10-CM

## 2022-01-01 DIAGNOSIS — Z87891 Personal history of nicotine dependence: Secondary | ICD-10-CM

## 2022-01-01 DIAGNOSIS — M19041 Primary osteoarthritis, right hand: Secondary | ICD-10-CM

## 2022-01-01 DIAGNOSIS — Z8659 Personal history of other mental and behavioral disorders: Secondary | ICD-10-CM

## 2022-01-01 DIAGNOSIS — M0630 Rheumatoid nodule, unspecified site: Secondary | ICD-10-CM | POA: Diagnosis not present

## 2022-01-01 DIAGNOSIS — M170 Bilateral primary osteoarthritis of knee: Secondary | ICD-10-CM

## 2022-01-01 DIAGNOSIS — M457 Ankylosing spondylitis of lumbosacral region: Secondary | ICD-10-CM

## 2022-01-01 DIAGNOSIS — M19042 Primary osteoarthritis, left hand: Secondary | ICD-10-CM

## 2022-01-01 MED ORDER — HUMIRA (2 PEN) 40 MG/0.4ML ~~LOC~~ AJKT: 40.0000 mg | AUTO-INJECTOR | SUBCUTANEOUS | 0 refills | Status: DC

## 2022-01-01 NOTE — Patient Instructions (Signed)

## 2022-01-02 LAB — IRON,TIBC AND FERRITIN PANEL
%SAT: 13 % (calc) — ABNORMAL LOW (ref 16–45)
Ferritin: 183 ng/mL (ref 16–288)
Iron: 42 ug/dL — ABNORMAL LOW (ref 45–160)
TIBC: 320 mcg/dL (calc) (ref 250–450)

## 2022-01-02 LAB — CBC WITH DIFFERENTIAL/PLATELET
Absolute Monocytes: 602 cells/uL (ref 200–950)
Basophils Absolute: 18 cells/uL (ref 0–200)
Basophils Relative: 0.3 %
Eosinophils Absolute: 183 cells/uL (ref 15–500)
Eosinophils Relative: 3.1 %
HCT: 40.4 % (ref 35.0–45.0)
Hemoglobin: 13.6 g/dL (ref 11.7–15.5)
Lymphs Abs: 1499 cells/uL (ref 850–3900)
MCH: 31.5 pg (ref 27.0–33.0)
MCHC: 33.7 g/dL (ref 32.0–36.0)
MCV: 93.5 fL (ref 80.0–100.0)
MPV: 10.7 fL (ref 7.5–12.5)
Monocytes Relative: 10.2 %
Neutro Abs: 3599 cells/uL (ref 1500–7800)
Neutrophils Relative %: 61 %
Platelets: 238 10*3/uL (ref 140–400)
RBC: 4.32 10*6/uL (ref 3.80–5.10)
RDW: 13.9 % (ref 11.0–15.0)
Total Lymphocyte: 25.4 %
WBC: 5.9 10*3/uL (ref 3.8–10.8)

## 2022-01-02 LAB — COMPLETE METABOLIC PANEL WITH GFR
AG Ratio: 1.2 (calc) (ref 1.0–2.5)
ALT: 7 U/L (ref 6–29)
AST: 15 U/L (ref 10–35)
Albumin: 4.1 g/dL (ref 3.6–5.1)
Alkaline phosphatase (APISO): 56 U/L (ref 37–153)
BUN: 10 mg/dL (ref 7–25)
CO2: 27 mmol/L (ref 20–32)
Calcium: 9.9 mg/dL (ref 8.6–10.4)
Chloride: 105 mmol/L (ref 98–110)
Creat: 0.69 mg/dL (ref 0.60–1.00)
Globulin: 3.3 g/dL (calc) (ref 1.9–3.7)
Glucose, Bld: 93 mg/dL (ref 65–99)
Potassium: 3.9 mmol/L (ref 3.5–5.3)
Sodium: 139 mmol/L (ref 135–146)
Total Bilirubin: 0.7 mg/dL (ref 0.2–1.2)
Total Protein: 7.4 g/dL (ref 6.1–8.1)
eGFR: 91 mL/min/{1.73_m2} (ref >=60)

## 2022-01-02 NOTE — Progress Notes (Signed)
CBC and CMP WNL.   Hemoglobin has improved.   Iron remains borderline low-42.  Please notify the patient and forward results to her PCP as requested by the patient.

## 2022-01-03 ENCOUNTER — Telehealth: Payer: Self-pay | Admitting: Internal Medicine

## 2022-01-03 ENCOUNTER — Telehealth: Payer: Self-pay | Admitting: Rheumatology

## 2022-01-03 NOTE — Telephone Encounter (Signed)
Patient states she contacted [facility] and she was advised that they need a new application for the Humira. Reviewed lab work with patient.  Patient advised I will forward this message to the pharmacy team to see if they can look into whether she needs an updated application. Patient advised we did send a prescription to [place] on 01/01/2022.

## 2022-01-03 NOTE — Telephone Encounter (Signed)
9:06 am  Patient called requesting to speak with Seth Bake directly.  Patient requested a return call.    12:37 pm  Patient left a voicemail stating "I was in the office yesterday and had bloodwork.  They were suppose to order Humira for me.  I called [facility] and they said they received an electronic request, but I need a new application and if you will call in a prescription it will be faster.  I've also been waiting for a call back from [place].  Please ask her to call me."

## 2022-01-03 NOTE — Telephone Encounter (Signed)
Patient called and will like Dr.Letvak to look at her  lab results from 01/02/22 and get a solution and she would like a call back at [phone]

## 2022-01-07 ENCOUNTER — Other Ambulatory Visit: Payer: Self-pay | Admitting: Rheumatology

## 2022-01-07 MED ORDER — METHOTREXATE 2.5 MG PO TABS: 10.0000 mg | ORAL_TABLET | ORAL | 0 refills | Status: DC

## 2022-01-07 NOTE — Telephone Encounter (Signed)
Patient states she is having trouble finding the vial of MTX due to it being on backorder. Patient states she is interested in switching back to the tablets until the issue has been resolved. Please advise if okay to send in MTX tablets.

## 2022-01-07 NOTE — Telephone Encounter (Signed)
Left message to advise prescription for MTX tablets have been sent to the pharmacy.

## 2022-01-15 ENCOUNTER — Other Ambulatory Visit: Payer: Self-pay | Admitting: Physician Assistant

## 2022-01-25 ENCOUNTER — Telehealth: Payer: Self-pay | Admitting: Rheumatology

## 2022-01-25 NOTE — Telephone Encounter (Signed)
Spoke with patient and advised patient we do not prescribe inhalers. Patient advised to contact Dr. Silvio Pate as he was the last one who prescribed it for her. Patient expressed understanding.

## 2022-01-25 NOTE — Telephone Encounter (Signed)
Patient called back to confirm we received her voicemail regarding the refill for her Ventolin inhaler.  Patient states Dr. Silvio Pate has prescribed it in the past, but she hasn't seen him in a while.

## 2022-01-25 NOTE — Telephone Encounter (Signed)
Attempted to contact the patient and left message to advise patient we would not be able to prescribe an inhaler as we do not treat her for the use of the inhaler and we do not prescribe it. Patient advised to follow up with PCP or urgent care.

## 2022-01-25 NOTE — Telephone Encounter (Signed)
Patient left a voicemail requesting a prescription for an inhaler be sent to [facility] on [address]. Patient states at her last appointment she was asked if she had an inhaler which she thought she had one and does not.

## 2022-02-03 ENCOUNTER — Other Ambulatory Visit: Payer: Self-pay | Admitting: Physician Assistant

## 2022-02-03 ENCOUNTER — Other Ambulatory Visit: Payer: Self-pay | Admitting: Internal Medicine

## 2022-02-05 ENCOUNTER — Other Ambulatory Visit: Payer: Self-pay | Admitting: Internal Medicine

## 2022-02-05 ENCOUNTER — Other Ambulatory Visit: Payer: Self-pay | Admitting: Rheumatology

## 2022-02-05 ENCOUNTER — Telehealth: Payer: Self-pay | Admitting: Pharmacist

## 2022-02-05 MED ORDER — PREDNISONE 5 MG PO TABS: ORAL_TABLET | ORAL | 0 refills | Status: DC

## 2022-02-05 NOTE — Telephone Encounter (Signed)
Medication Samples have been provided to the patient.  Drug name: Rasuvo       Strength: 10 mg        Qty: 1  LOT: [id number]  Exp.Date: 05/2022  Dosing instructions: Inject one pen into skin once weekly.

## 2022-02-05 NOTE — Addendum Note (Signed)
Addended by: Carole Binning on: 02/05/2022 04:34 PM   Modules accepted: Orders

## 2022-02-05 NOTE — Telephone Encounter (Signed)
Patient advised we are sending in prednisone taper starting at 20 mg tapering by 5 mg every 4 days.   Patient advised to avoid the concurrent use of NSAIDs while taking prednisone.   Patient advised to take prednisone in the morning with food.

## 2022-02-05 NOTE — Telephone Encounter (Addendum)
Please start Rasuvo BIV. (Since this medication has pt assistance)  Dose: '10mg'$  SQ every 7 days  Dx: Rheumatoid arthritis (M05.9)  Previously tried therapies: Oral methotrexate - GI side effects Generic MTX vial/syringe - has been unable to acquire due to shortage but also hand swelling and inflammation that limits her dexterity and places her at significant risk for needlestick injurty (based solely on CDAI score from [place] on 11/01/21)  - please submit this office note for clinicals  Knox Saliva, PharmD, MPH, BCPS, CPP Clinical Pharmacist (Rheumatology and Pulmonology)   ----- Message from Carole Binning, LPN sent at 7/61/8485  1:28 PM EDT ----- Please apply for Methotrexate auto injector. Patient unable to tolerate oral Methotrexate. Thanks!

## 2022-02-05 NOTE — Telephone Encounter (Signed)
Ok to send in prednisone taper starting at 20 mg tapering by 5 mg every 4 days.   Please advise the patient to avoid the concurrent use of NSAIDs while taking prednisone.   Take prednisone in the morning with food.

## 2022-02-05 NOTE — Telephone Encounter (Signed)
Patient advised we are applying for the MTX through her insurance. Patient advised we do have a sample she may come by the office to pick up.   Patient is requesting a prescription for Prednisone. Patient is having pain feet, ankles, knees, shoulders, hands and elbows. Patient denies any swelling. Patient states she has nodules on bilateral arms that go from her wrist to her elbows. Patient states she also has a rash on her legs which she has reached out to the dermatologist about and is awaiting a response.  Having trouble sleeping. Please advise.

## 2022-02-05 NOTE — Telephone Encounter (Signed)
Patient left a voicemail stating she has been taking Methotrexate injections regularly and they ran out.  Patient states she cannot take the Methotrexate pills.  Patient states she is in "bad shape and needs to be seen today by anybody."

## 2022-02-06 ENCOUNTER — Telehealth: Payer: Self-pay | Admitting: Pharmacy Technician

## 2022-02-06 ENCOUNTER — Other Ambulatory Visit (HOSPITAL_COMMUNITY): Payer: Self-pay

## 2022-02-06 NOTE — Telephone Encounter (Deleted)
Submitted a Prior Authorization request to [facility] for RASUVO via CoverMyMeds. Will update once we receive a response.   [id number]

## 2022-02-06 NOTE — Telephone Encounter (Signed)
Submitted a Prior Authorization request to [facility] for RASUVO via CoverMyMeds. Will update once we receive a response.     [id number]

## 2022-02-06 NOTE — Telephone Encounter (Signed)
ERROR

## 2022-02-06 NOTE — Telephone Encounter (Signed)
Patient Advocate Encounter  Prior Authorization for RASUVO has been approved.    PA# PA Case ID: [id number] Effective dates: 1.1.23 through 7.25.24  Halimah Bewick B. CPhT P: [phone] F: [phone]

## 2022-02-27 ENCOUNTER — Telehealth: Payer: Self-pay | Admitting: Rheumatology

## 2022-02-27 DIAGNOSIS — M0630 Rheumatoid nodule, unspecified site: Secondary | ICD-10-CM

## 2022-02-27 DIAGNOSIS — M0609 Rheumatoid arthritis without rheumatoid factor, multiple sites: Secondary | ICD-10-CM

## 2022-02-27 DIAGNOSIS — M47819 Spondylosis without myelopathy or radiculopathy, site unspecified: Secondary | ICD-10-CM

## 2022-02-27 DIAGNOSIS — H209 Unspecified iridocyclitis: Secondary | ICD-10-CM

## 2022-02-27 NOTE — Telephone Encounter (Signed)
Patient called the office stating she doesn't think she is being treated for the correct thing. Patient states her brother and father both died of something that she believes she has. Patient requests a call from Dr. Estanislado Pandy on where she needs to be seen.

## 2022-02-27 NOTE — Telephone Encounter (Signed)
I left a message on the answering machine for patient to call back.  We can refer her to [facility] or she can get a referral to another rheumatologist in town by her PCP.

## 2022-02-28 ENCOUNTER — Ambulatory Visit (INDEPENDENT_AMBULATORY_CARE_PROVIDER_SITE_OTHER): Payer: Medicare Other | Admitting: Nurse Practitioner

## 2022-02-28 ENCOUNTER — Other Ambulatory Visit: Payer: Self-pay | Admitting: Internal Medicine

## 2022-02-28 ENCOUNTER — Telehealth: Payer: Self-pay | Admitting: Rheumatology

## 2022-02-28 VITALS — BP 120/58 | HR 81 | Temp 97.8°F | Resp 16 | Ht 67.0 in | Wt 140.5 lb

## 2022-02-28 DIAGNOSIS — R29898 Other symptoms and signs involving the musculoskeletal system: Secondary | ICD-10-CM

## 2022-02-28 DIAGNOSIS — R52 Pain, unspecified: Secondary | ICD-10-CM | POA: Diagnosis not present

## 2022-02-28 DIAGNOSIS — R202 Paresthesia of skin: Secondary | ICD-10-CM

## 2022-02-28 DIAGNOSIS — M0609 Rheumatoid arthritis without rheumatoid factor, multiple sites: Secondary | ICD-10-CM | POA: Diagnosis not present

## 2022-02-28 LAB — COMPREHENSIVE METABOLIC PANEL
ALT: 6 U/L (ref 0–35)
AST: 14 U/L (ref 0–37)
Albumin: 4 g/dL (ref 3.5–5.2)
Alkaline Phosphatase: 55 U/L (ref 39–117)
BUN: 11 mg/dL (ref 6–23)
CO2: 25 mEq/L (ref 19–32)
Calcium: 9.7 mg/dL (ref 8.4–10.5)
Chloride: 102 mEq/L (ref 96–112)
Creatinine, Ser: 0.7 mg/dL (ref 0.40–1.20)
GFR: 85.17 mL/min (ref >60.00)
Glucose, Bld: 84 mg/dL (ref 70–99)
Potassium: 3.9 mEq/L (ref 3.5–5.1)
Sodium: 137 mEq/L (ref 135–145)
Total Bilirubin: 0.8 mg/dL (ref 0.2–1.2)
Total Protein: 7.9 g/dL (ref 6.0–8.3)

## 2022-02-28 LAB — CBC
HCT: 37.4 % (ref 36.0–46.0)
Hemoglobin: 12.6 g/dL (ref 12.0–15.0)
MCHC: 33.6 g/dL (ref 30.0–36.0)
MCV: 90 fl (ref 78.0–100.0)
Platelets: 208 10*3/uL (ref 150.0–400.0)
RBC: 4.16 Mil/uL (ref 3.87–5.11)
RDW: 15.4 % (ref 11.5–15.5)
WBC: 5.3 10*3/uL (ref 4.0–10.5)

## 2022-02-28 LAB — SEDIMENTATION RATE: Sed Rate: 45 mm/hr — ABNORMAL HIGH (ref 0–30)

## 2022-02-28 NOTE — Telephone Encounter (Signed)
Spoke with patient and advised patient advised per Dr. Estanislado Pandy we can refer her to [facility] or she can get a referral to another rheumatologist in town by her PCP. Patient request we place referral for [facility]. Referral placed.

## 2022-02-28 NOTE — Assessment & Plan Note (Signed)
Widespread pain.  Likely related to some of her autoimmune disorders.  Patient has been on steroids 6 times in the past 8 months and I can see without great relief.  Patient recently finished steroids approximate 3 days ago.  An option could be maybe trying Cymbalta for additional pain relief patient is followed by psychiatry we will need to reach out to them prior to making any decisions.  Patient does take Tylenol which does help she can continue doing such.

## 2022-02-28 NOTE — Assessment & Plan Note (Signed)
Was being managed by Dr. Estanislado Pandy for her rheumatoid arthritis.  Patient had been on oral methotrexate cannot tolerate it with injectable methotrexate cannot tolerate it did not make an oral solution so patient not currently on methotrexate at all.  Is currently taking Humira.  Per office note looks like patient is getting referred to a different rheumatologist

## 2022-02-28 NOTE — Patient Instructions (Addendum)
Nice to see you today I will be in touch with the labs.  Follow up if no improvement   You can use some ensure to help when you do not eat enough in the day.

## 2022-02-28 NOTE — Telephone Encounter (Signed)
Patient left a voicemail stating she had an appointment with Dr. Alla German assistant today.  Patient states "she is experiencing pain throughout her body and now can't sit up without assistance.  This is awful.  Can you give me something for the pain just until I go to [facility].  Please."  Patient was sobbing as she left the message.

## 2022-02-28 NOTE — Telephone Encounter (Signed)
Left message for patient to advise she should be evaluated in the emergency room.

## 2022-02-28 NOTE — Assessment & Plan Note (Signed)
This could be symptoms subjectivity but also subjectively.  Patient does have decreased bilateral grip strength.  Curious if patient may have PMR.  We will send off a sed rate with labs.  Patient recently done steroids without great relief.  States when she is on the higher dose she does feel better.

## 2022-02-28 NOTE — Progress Notes (Signed)
Acute Office Visit  Subjective:     Patient ID: Kirsten Harper, female    DOB: 13-Jul-1946, 76 y.o.   MRN: [id number]  Chief Complaint  Patient presents with   discuss what can be done to help her with her pain, weakness    Loosing upper body strength.    Labs Only    HPI Patient is in today for   Weakness: States that it has been going on for about States that she was at a friends house and could not lift a bed pillow and could not. For approx 2 months. States that it has been increasing over the past 2 months. Fh history of frontotemporal lobe dementia and parkinsonism. Numb in fingers on both sides that has been going on for a year. Not currently mtx but on humira.    States that she has been to PT twice in the past.  She does have a walker, rollator, and cane. She does not have either of them with her. States the last PT was a few years ago.   States that she has imbalanced and altered gate that has been present for approx 6 months  Generalized pain: States that it has been bothering her for approx 1 year. States that when she was mtx and humira that did help. But no longer on the mtx. Has been using tylenol that does help, but does not take it all the way away. States that she finished steroids 3 days ago. States the higher doses were helpful. She has not cymbalta.   Review of Systems  Constitutional:  Negative for chills and fever.  Gastrointestinal:  Negative for abdominal pain, nausea and vomiting.  Genitourinary: Negative.   Neurological:  Positive for tremors and weakness. Negative for tingling.        Objective:    BP (!) 120/58   Pulse 81   Temp 97.8 F (36.6 C)   Resp 16   Ht '5\' 7"'$  (1.702 m)   Wt 140 lb 8 oz (63.7 kg)   SpO2 97%   BMI 22.01 kg/m  BP Readings from Last 3 Encounters:  02/28/22 (!) 120/58  01/01/22 107/61  11/01/21 133/71   Wt Readings from Last 3 Encounters:  02/28/22 140 lb 8 oz (63.7 kg)  01/01/22 145 lb (65.8 kg)  11/01/21 148 lb  9.6 oz (67.4 kg)      Physical Exam Vitals and nursing note reviewed.  Constitutional:      Comments: Frail-appearing with a slow gait  HENT:     Right Ear: Tympanic membrane, ear canal and external ear normal.     Left Ear: Tympanic membrane, ear canal and external ear normal.  Cardiovascular:     Rate and Rhythm: Normal rate and regular rhythm.     Pulses: Normal pulses.     Heart sounds: Normal heart sounds.  Pulmonary:     Effort: Pulmonary effort is normal.     Breath sounds: Normal breath sounds.  Neurological:     General: No focal deficit present.     Mental Status: She is alert.     Deep Tendon Reflexes:     Reflex Scores:      Bicep reflexes are 2+ on the right side and 2+ on the left side.      Patellar reflexes are 1+ on the right side and 1+ on the left side.    Comments: Bilateral upper and lower extremity strength 5/5.  Patient does have decreased grip strength  in bilateral hands     No results found for any visits on 02/28/22.      Assessment & Plan:   Problem List Items Addressed This Visit       Musculoskeletal and Integument   Rheumatoid arthritis of multiple sites with negative rheumatoid factor ([place])    Was being managed by Dr. Estanislado Pandy for her rheumatoid arthritis.  Patient had been on oral methotrexate cannot tolerate it with injectable methotrexate cannot tolerate it did not make an oral solution so patient not currently on methotrexate at all.  Is currently taking Humira.  Per office note looks like patient is getting referred to a different rheumatologist      Relevant Medications   acetaminophen (TYLENOL) 325 MG tablet     Other   Paresthesias   Relevant Orders   CBC   Comprehensive metabolic panel   Upper extremity weakness    This could be symptoms subjectivity but also subjectively.  Patient does have decreased bilateral grip strength.  Curious if patient may have PMR.  We will send off a sed rate with labs.  Patient recently done  steroids without great relief.  States when she is on the higher dose she does feel better.      Relevant Orders   CBC   Comprehensive metabolic panel   Sedimentation rate   Generalized pain - Primary    Widespread pain.  Likely related to some of her autoimmune disorders.  Patient has been on steroids 6 times in the past 8 months and I can see without great relief.  Patient recently finished steroids approximate 3 days ago.  An option could be maybe trying Cymbalta for additional pain relief patient is followed by psychiatry we will need to reach out to them prior to making any decisions.  Patient does take Tylenol which does help she can continue doing such.      Relevant Orders   CBC   Comprehensive metabolic panel   Sedimentation rate    No orders of the defined types were placed in this encounter.   Return if symptoms worsen or fail to improve.  Romilda Garret, NP

## 2022-03-01 ENCOUNTER — Emergency Department (HOSPITAL_COMMUNITY)
Admission: EM | Admit: 2022-03-01 | Discharge: 2022-03-01 | Disposition: A | Discharge status: Home / Self Care | Payer: Medicare Other | Attending: Emergency Medicine | Admitting: Emergency Medicine

## 2022-03-01 ENCOUNTER — Encounter (HOSPITAL_COMMUNITY): Payer: Self-pay

## 2022-03-01 DIAGNOSIS — M13862 Other specified arthritis, left knee: Secondary | ICD-10-CM | POA: Diagnosis not present

## 2022-03-01 DIAGNOSIS — M25562 Pain in left knee: Secondary | ICD-10-CM | POA: Diagnosis present

## 2022-03-01 DIAGNOSIS — G8929 Other chronic pain: Secondary | ICD-10-CM | POA: Diagnosis not present

## 2022-03-01 DIAGNOSIS — M1990 Unspecified osteoarthritis, unspecified site: Secondary | ICD-10-CM

## 2022-03-01 DIAGNOSIS — N390 Urinary tract infection, site not specified: Secondary | ICD-10-CM

## 2022-03-01 DIAGNOSIS — M13861 Other specified arthritis, right knee: Secondary | ICD-10-CM | POA: Insufficient documentation

## 2022-03-01 DIAGNOSIS — W010XXA Fall on same level from slipping, tripping and stumbling without subsequent striking against object, initial encounter: Secondary | ICD-10-CM | POA: Diagnosis not present

## 2022-03-01 LAB — URINALYSIS, ROUTINE W REFLEX MICROSCOPIC
Bacteria, UA: NONE SEEN
Bilirubin Urine: NEGATIVE
Glucose, UA: NEGATIVE mg/dL
Hgb urine dipstick: NEGATIVE
Ketones, ur: NEGATIVE mg/dL
Nitrite: NEGATIVE
Protein, ur: 30 mg/dL — AB
Specific Gravity, Urine: 1.03 (ref 1.005–1.030)
pH: 5 (ref 5.0–8.0)

## 2022-03-01 MED ORDER — CEPHALEXIN 500 MG PO CAPS
500.0000 mg | ORAL_CAPSULE | Freq: Once | ORAL | Status: AC
  Administered 2022-03-01: 500 mg via ORAL
  Filled 2022-03-01: qty 1

## 2022-03-01 MED ORDER — ACETAMINOPHEN 325 MG PO TABS
650.0000 mg | ORAL_TABLET | Freq: Once | ORAL | Status: AC
  Administered 2022-03-01: 650 mg via ORAL
  Filled 2022-03-01: qty 2

## 2022-03-01 MED ORDER — IBUPROFEN 200 MG PO TABS
400.0000 mg | ORAL_TABLET | Freq: Once | ORAL | Status: AC
  Administered 2022-03-01: 400 mg via ORAL
  Filled 2022-03-01: qty 2

## 2022-03-01 MED ORDER — CEPHALEXIN 500 MG PO CAPS: 500.0000 mg | ORAL_CAPSULE | Freq: Three times a day (TID) | ORAL | 0 refills | Status: AC

## 2022-03-01 MED ORDER — TRAMADOL HCL 50 MG PO TABS
50.0000 mg | ORAL_TABLET | Freq: Once | ORAL | Status: AC
  Administered 2022-03-01: 50 mg via ORAL
  Filled 2022-03-01: qty 1

## 2022-03-01 MED ORDER — TRAMADOL HCL 50 MG PO TABS: 50.0000 mg | ORAL_TABLET | Freq: Three times a day (TID) | ORAL | 0 refills | Status: AC | PRN

## 2022-03-01 NOTE — ED Provider Notes (Signed)
[place] DEPT Provider Note   CSN: [id number] Arrival date & time: 03/01/22  0941     History  Chief Complaint  Patient presents with   Kirsten Harper is a 76 y.o. female.  Patient with hx arthritis, c/o pain to bil knees/joints. States has same symptoms on chronic basis, and saw rheumatology yesterday for same - states no change in meds,  and needs something for pain.  Slid off couch last night, but denies injury. At baseline, walks w walker.  No focal or unilateral numbness or weakness. No headache. No neck/back pain. No radicular pain. No fever or chills.   The history is provided by the patient, medical records and the EMS personnel.  Fall Pertinent negatives include no chest pain, no abdominal pain, no headaches and no shortness of breath.       Home Medications Prior to Admission medications   Medication Sig Start Date End Date Taking? Authorizing Provider  acetaminophen (TYLENOL) 325 MG tablet Take 650 mg by mouth every 6 (six) hours as needed.    [provider]  Adalimumab (HUMIRA PEN) 40 MG/0.4ML PNKT Inject 40 mg into the skin every 14 (fourteen) days. 01/01/22   Ofilia Neas, PA-C  ALPRAZolam Duanne Moron) 0.5 MG tablet Take 0.5 mg by mouth 2 (two) times daily. 09/11/20   [provider]  amitriptyline (ELAVIL) 100 MG tablet Take 100 mg by mouth at bedtime. Patient not taking: Reported on 11/01/2021    [provider]  amLODipine (NORVASC) 10 MG tablet Take 1 tablet (10 mg total) by mouth daily. Patient taking differently: Take 5 mg by mouth daily. 06/27/21   Venia Carbon, MD  cetirizine (ZYRTEC) 10 MG tablet Take 10 mg by mouth daily.    [provider]  dorzolamide-timolol (COSOPT) 22.3-6.8 MG/ML ophthalmic solution Place 1 drop into both eyes 2 (two) times daily.  12/17/16   [provider]  Doxepin HCl 6 MG TABS Take 1 tablet by mouth at bedtime. Patient not taking: Reported on  02/28/2022 02/26/22   [provider]  escitalopram (LEXAPRO) 10 MG tablet Take 10 mg by mouth daily. 03/07/20   [provider]  fluticasone (FLONASE) 50 MCG/ACT nasal spray Place 1 spray into both nostrils daily. 06/26/20   [provider]  folic acid (FOLVITE) 1 MG tablet TAKE 1 TABLET BY MOUTH EVERY DAY 07/26/21   Ofilia Neas, PA-C  gabapentin (NEURONTIN) 300 MG capsule Take 1-2 capsules (300-600 mg total) by mouth at bedtime. For restless legs 09/25/21   Viviana Simpler I, MD  losartan (COZAAR) 100 MG tablet TAKE 1 TABLET BY MOUTH EVERY DAY 02/06/22   Venia Carbon, MD  LOTEMAX 0.5 % ophthalmic suspension Place 1 drop into both eyes 2 (two) times daily.  12/17/16   [provider]  TUBERCULIN SYR 1CC/27GX1/2" (B-D TB SYRINGE 1CC/27GX1/2") 27G X 1/2" 1 ML MISC 12 Syringes by Does not apply route once a week. 03/30/21   Ofilia Neas, PA-C  VENTOLIN HFA 108 (90 Base) MCG/ACT inhaler INHALE 1 TO 2 PUFFS INTO THE LUNGS EVERY 6 HOURS AS NEEDED 02/04/22   Venia Carbon, MD      Allergies    Amoxapine and related, Hydrocodone bit-homatrop mbr, Seroquel [quetiapine], and Other    Review of Systems   Review of Systems  Constitutional:  Negative for chills and fever.  HENT:  Negative for sore throat.   Eyes:  Negative for redness.  Respiratory:  Negative for cough and shortness of breath.   Cardiovascular:  Negative for chest pain and leg swelling.  Gastrointestinal:  Negative for abdominal pain, diarrhea and vomiting.  Genitourinary:  Negative for dysuria and flank pain.  Musculoskeletal:  Positive for arthralgias. Negative for back pain and neck pain.  Skin:  Negative for rash.  Neurological:  Negative for headaches.  Hematological:  Does not bruise/bleed easily.  Psychiatric/Behavioral:  Negative for confusion.     Physical Exam Updated Vital Signs There were no vitals taken for this visit. Physical Exam Vitals and nursing note reviewed.   Constitutional:      Appearance: Normal appearance. She is well-developed.  HENT:     Head: Atraumatic.     Nose: Nose normal.     Mouth/Throat:     Mouth: Mucous membranes are moist.  Eyes:     General: No scleral icterus.    Conjunctiva/sclera: Conjunctivae normal.  Neck:     Trachea: No tracheal deviation.  Cardiovascular:     Rate and Rhythm: Normal rate and regular rhythm.     Pulses: Normal pulses.     Heart sounds: Normal heart sounds. No murmur heard.    No friction rub. No gallop.  Pulmonary:     Effort: Pulmonary effort is normal. No respiratory distress.     Breath sounds: Normal breath sounds.  Abdominal:     General: Bowel sounds are normal. There is no distension.     Palpations: Abdomen is soft.     Tenderness: There is no abdominal tenderness.  Genitourinary:    Comments: No cva tenderness.  Musculoskeletal:        General: No swelling.     Cervical back: Normal range of motion and neck supple. No rigidity or tenderness. No muscular tenderness.     Comments: CTLS spine, non tender, aligned, no step off. Passive rom bil knees without pain. No effusion. Knees grossly stable. Distal pulses palp. No focal bony tenderness noted.   Skin:    General: Skin is warm and dry.     Findings: No rash.  Neurological:     Mental Status: She is alert.     Comments: Alert, speech normal. Motor/sens grossly intact bil.   Psychiatric:        Mood and Affect: Mood normal.     ED Results / Procedures / Treatments   Labs (all labs ordered are listed, but only abnormal results are displayed) Results for orders placed or performed during the hospital encounter of 03/01/22  Urinalysis, Routine w reflex microscopic  Result Value Ref Range   Color, Urine YELLOW YELLOW   APPearance HAZY (A) CLEAR   Specific Gravity, Urine 1.030 1.005 - 1.030   pH 5.0 5.0 - 8.0   Glucose, UA NEGATIVE NEGATIVE mg/dL   Hgb urine dipstick NEGATIVE NEGATIVE   Bilirubin Urine NEGATIVE NEGATIVE    Ketones, ur NEGATIVE NEGATIVE mg/dL   Protein, ur 30 (A) NEGATIVE mg/dL   Nitrite NEGATIVE NEGATIVE   Leukocytes,Ua LARGE (A) NEGATIVE   RBC / HPF 0-5 0 - 5 RBC/hpf   WBC, UA 11-20 0 - 5 WBC/hpf   Bacteria, UA NONE SEEN NONE SEEN   Squamous Epithelial / LPF 6-10 0 - 5   Mucus PRESENT    Hyaline Casts, UA PRESENT    Non Squamous Epithelial 0-5 (A) NONE SEEN     EKG None  Radiology No results found.  Procedures Procedures    Medications Ordered in ED Medications  traMADol (ULTRAM) tablet 50 mg (has no administration in time range)    ED Course/ Medical Decision Making/ A&P                           Medical Decision Making Problems Addressed: Arthritis: chronic illness or injury with exacerbation, progression, or side effects of treatment that poses a threat to life or bodily functions Chronic arthralgias of knees and hips: chronic illness or injury with exacerbation, progression, or side effects of treatment that poses a threat to life or bodily functions Urinary tract infection without hematuria, site unspecified: acute illness or injury  Amount and/or Complexity of Data Reviewed External Data Reviewed: labs and notes. Labs: ordered. Decision-making details documented in ED Course.  Risk OTC drugs. Prescription drug management.    Reviewed nursing notes and prior charts for additional history. External reports reviewed. Pt with labs from yesterday - labs reviewed/interpreted by me - hgb and wbc normal, chem normal.   Additional history from: EMS.   Ultram po.   Additional labs reviewed/interpreted by me - possible uti.   Keflex po.  Pt inquires about possible referral to neurology as family members with Parkinson's and dementia, and wants eval for same - will provide info.   Pt currently appears stable for d/c.   Rec pcp f/u.           Final Clinical Impression(s) / ED Diagnoses Final diagnoses:  None    Rx / DC Orders ED Discharge Orders      None         Lajean Saver, MD 03/01/22 1303

## 2022-03-01 NOTE — ED Notes (Signed)
Pt has sandwich and coffee and crackers and peanut butter

## 2022-03-01 NOTE — Discharge Instructions (Addendum)
It was our pleasure to provide your ER care today - we hope that you feel better.  Drink plenty of fluids/stay well hydrated. Get adequate nutrition. Fall precautions - use walker, and great care when up and about to minimize risk of falling.   Take keflex (antibiotic) as prescribed for possible urine infection.   Take acetaminophen or ibuprofen as need for pain. You may also take ultram as need for pain - no driving when taking.   Follow up closely with primary care doctor in the coming week.  We have made a home health referral for home health services, therapy, etc - they should be contacting you in the next few days.   Also follow up with neurologist - call office to arrange appointment.   Return to ER if worse, new symptoms, high fevers, chest pain, increased trouble breathing, or other emergency concern.

## 2022-03-01 NOTE — ED Notes (Signed)
Pt wants more Tylenol "to make up for what she has missed all day being here"

## 2022-03-01 NOTE — ED Notes (Signed)
Called [facility] for transport home.  

## 2022-03-01 NOTE — ED Notes (Signed)
2nd order for tylenol not given due to 650 mg given at 1700.

## 2022-03-01 NOTE — ED Triage Notes (Signed)
Pt arrived via EMS, from home, EMS was called at 1am last night, slipped off couch, no LOC. Lift assist back up and did not want transport at that time. Woke this morning with worsening pain to bilateral knees, unable to walk to baseline.

## 2022-03-04 ENCOUNTER — Telehealth: Payer: Self-pay

## 2022-03-04 ENCOUNTER — Telehealth: Payer: Self-pay | Admitting: Rheumatology

## 2022-03-04 NOTE — Telephone Encounter (Signed)
Patient called back in and said EMS is in route to her and wanted to let us know.

## 2022-03-04 NOTE — Telephone Encounter (Signed)
Patient called stating she was taken by ambulance to the ER last Friday because she was unable to get up.  Patient was very upset because they wouldn't admit her.  Patient states she has pain everywhere especially in her shoulders, arms, and knees.  Patient was prescribed Tramadol which is not helping.  Patient states she was told to take 1 tablet every 8 hours, but has been doubling the dose and still doesn't have any relief.  Patient states she is scheduled to see PCP today at 4:00 pm and is hoping they can refer her to a rehab facility so he can get some help.

## 2022-03-04 NOTE — Telephone Encounter (Signed)
Pt calls and EMS had to come and get pt out of bed and into a chair; knee pain 9-10. Pt said she has gradually lost her upper body strength;pt said she cannot walk hardly at all even with walker. Pt said her condition has worsened. Pt seen at ED on 03/01/22 and offered pt an appt this afternoon with Dr Silvio Pate and pt said she will call EMS and go back to ED. Pt request that Dr Silvio Pate call hospital and advise ED to admit pt; advised pt ED will do triage and provider will eval. Pt voiced understanding and she is going to call EMS. Sending note to Dr Silvio Pate.pt said also she has not called [facility] but has never seen them before and pt said her feet and ankles are very swollen and she needs help now. Pt will go to ED. Sending note to Dr Silvio Pate and Larene Beach CMA.

## 2022-03-04 NOTE — Telephone Encounter (Signed)
Pt walked in this afternoon for her 4 PM appt. I advised pt that when I talked with her earlier this morning that pt did not think she could get inside the office this afternoon and pt said she was going to hospital by EMS. Pt said she has had some problem with her memory and could she be worked in since she is here. No available work ins left for today. Pt voiced understanding and I spoke with Amy front office mgr and she said pt should go to ED as planned. Pt said she is not going back to ED and pts sister with her said that if goes to UC will probably send her to ED and when pt was seen at ED on 03/01/22 pt was advised to FU with PCP. Pt wanted to schedule next appt with Dr Silvio Pate. Scheduled appt with Dr Silvio Pate on 03/07/22 at 2 PM with pt being at office at 1:45 to get cked in out front. Appt info written down for pt and pt gave card to her sister. Pt and pts sister understands cannot be late to be seen. UC & ED precautions also given and pt and her sister voiced understanding.sending note to Dr Silvio Pate and Larene Beach CMA.

## 2022-03-04 NOTE — Telephone Encounter (Signed)
I returned patient's call.  Patient states that she does not have any joint swelling.  She has been having fluid retention in her legs.  She also has discomfort in her knee joints.  She has difficulty getting up from the chair.  She has been taking Humira injections every other week.  She discontinued oral methotrexate as she could not tolerate the side effects.  She had no side effects from subcutaneous methotrexate which is not available to her.  I informed her that per her request we have referred her to [facility] rheumatology clinic.  Patient has an appointment with her PCP this afternoon for the evaluation of fluid retention and urinary tract infection.  I advised her to contact us if she have any further concerns.

## 2022-03-04 NOTE — Telephone Encounter (Signed)
8/24 should be okay since she has had evaluation for her symptoms already

## 2022-03-06 ENCOUNTER — Emergency Department (HOSPITAL_COMMUNITY): Payer: Medicare Other

## 2022-03-06 ENCOUNTER — Emergency Department (HOSPITAL_COMMUNITY)
Admission: EM | Admit: 2022-03-06 | Discharge: 2022-03-07 | Disposition: A | Discharge status: Home / Self Care | Payer: Medicare Other | Attending: Emergency Medicine | Admitting: Emergency Medicine

## 2022-03-06 DIAGNOSIS — J449 Chronic obstructive pulmonary disease, unspecified: Secondary | ICD-10-CM | POA: Insufficient documentation

## 2022-03-06 DIAGNOSIS — W19XXXA Unspecified fall, initial encounter: Secondary | ICD-10-CM | POA: Insufficient documentation

## 2022-03-06 DIAGNOSIS — G8929 Other chronic pain: Secondary | ICD-10-CM | POA: Insufficient documentation

## 2022-03-06 DIAGNOSIS — S0003XA Contusion of scalp, initial encounter: Secondary | ICD-10-CM | POA: Insufficient documentation

## 2022-03-06 DIAGNOSIS — N189 Chronic kidney disease, unspecified: Secondary | ICD-10-CM | POA: Insufficient documentation

## 2022-03-06 DIAGNOSIS — S098XXA Other specified injuries of head, initial encounter: Secondary | ICD-10-CM

## 2022-03-06 DIAGNOSIS — S0990XA Unspecified injury of head, initial encounter: Secondary | ICD-10-CM | POA: Diagnosis present

## 2022-03-06 DIAGNOSIS — M459 Ankylosing spondylitis of unspecified sites in spine: Secondary | ICD-10-CM | POA: Insufficient documentation

## 2022-03-06 LAB — BASIC METABOLIC PANEL
Anion gap: 7 (ref 5–15)
BUN: 24 mg/dL — ABNORMAL HIGH (ref 8–23)
CO2: 24 mmol/L (ref 22–32)
Calcium: 9 mg/dL (ref 8.9–10.3)
Chloride: 105 mmol/L (ref 98–111)
Creatinine, Ser: 0.73 mg/dL (ref 0.44–1.00)
GFR, Estimated: >60 mL/min (ref >60)
Glucose, Bld: 85 mg/dL (ref 70–99)
Potassium: 3.9 mmol/L (ref 3.5–5.1)
Sodium: 136 mmol/L (ref 135–145)

## 2022-03-06 LAB — CBC
HCT: 32.5 % — ABNORMAL LOW (ref 36.0–46.0)
Hemoglobin: 11 g/dL — ABNORMAL LOW (ref 12.0–15.0)
MCH: 30.1 pg (ref 26.0–34.0)
MCHC: 33.8 g/dL (ref 30.0–36.0)
MCV: 89 fL (ref 80.0–100.0)
Platelets: 257 10*3/uL (ref 150–400)
RBC: 3.65 MIL/uL — ABNORMAL LOW (ref 3.87–5.11)
RDW: 14.8 % (ref 11.5–15.5)
WBC: 4.7 10*3/uL (ref 4.0–10.5)
nRBC: 0 % (ref 0.0–0.2)

## 2022-03-06 LAB — CBG MONITORING, ED: Glucose-Capillary: 88 mg/dL (ref 70–99)

## 2022-03-06 MED ORDER — OXYCODONE HCL 5 MG PO TABS
10.0000 mg | ORAL_TABLET | Freq: Once | ORAL | Status: AC
  Administered 2022-03-06: 10 mg via ORAL
  Filled 2022-03-06: qty 2

## 2022-03-06 MED ORDER — CEPHALEXIN 500 MG PO CAPS
500.0000 mg | ORAL_CAPSULE | Freq: Once | ORAL | Status: AC
  Administered 2022-03-06: 500 mg via ORAL
  Filled 2022-03-06: qty 1

## 2022-03-06 NOTE — Telephone Encounter (Signed)
Called and spoke to pt. Advised her the ER advised her wrong. Dr Silvio Pate cannot admit her to a facility. As an outpt, she would have to find where she wants to go. He can fill out a FL2 for her since her chart has recently been updated when she saw Romilda Garret, NP. She said [facility] would not admit her. I suggested she go to [facility] or [facility]. Hopefully they will admit her and then transfer her to a facility when she is released.

## 2022-03-06 NOTE — Social Work (Signed)
CSW left a note for the am CSW as well RNCM to follow up with the Pt tomorrow for home care options.

## 2022-03-06 NOTE — Telephone Encounter (Signed)
Patient called because she was discharged form the hospital but said she cant really walk, even with a walker. She said she has had to get EMS out to help her do things. She wants to go to a nursing home but was told she has to be put in by her PCP. She would like a call back as soon as possible at [phone]. She said she has ankylosing spondylitis

## 2022-03-06 NOTE — Discharge Instructions (Signed)
We have consulted our transition of care team to help get you home nursing.  They will call you tomorrow morning.  Please continue to follow with your rheumatologist for your chronic pain for ankylosing spondylitis.  Continue to take your steroids.

## 2022-03-06 NOTE — ED Notes (Signed)
Signature pad unavailable for signature  

## 2022-03-06 NOTE — ED Notes (Signed)
Pt placed on list to be transported back home

## 2022-03-06 NOTE — ED Notes (Signed)
Per [facility], whom was supposed to transport pt back to her house, pt stated that the door is locked to the house and there is no way to get in. I tried to contact pts sister, Arnette Felts, I was unable to reach anyone at this time, Network engineer made aware, charge nurse made aware

## 2022-03-06 NOTE — ED Triage Notes (Signed)
Pt to ED via EMS c/o generalized weakness and chronic pain x 1 year progressively getting worse. Resulting in multiple falls. Last fall last night.  called EMS d/t needing assist up from couch. Hematoma noted to back of head and right eyebrow. Not on blood thinners.  Last VS: Cbg 134, 126/76, p 88, 18, 96%RA. No medications given by EMS.

## 2022-03-06 NOTE — ED Notes (Signed)
Pt given water and crackers  

## 2022-03-06 NOTE — ED Provider Triage Note (Signed)
Emergency Medicine Provider Triage Evaluation Note  Kirsten Harper , a 76 y.o. female  was evaluated in triage.  Pt complains of worsening pain in bilateral knees and other joints.  Patient with history of ankylosing spondylitis.  She reports that she recently has not been able to get up out of her chair on her own.  She has been having incontinence due to inability to get up.  Patient reports that she had a fall last night, hematoma on the back of the head and right eyebrow noted.  Patient reports that she has no assistance at home, reports that in fact she is taking care of her 78 year old mother.  Patient is tearful in triage, begging not to be sent home because she cannot take it on her own.  Review of Systems  Positive: Knee pain, fall, head injury Negative: Loss of consciousness, vision changes, numbness  Physical Exam  BP 118/60 (BP Location: Right Arm)   Pulse 81   Temp 98.4 F (36.9 C) (Oral)   Resp 20   Ht '5\' 7"'$  (1.702 m)   Wt 63.5 kg   SpO2 96%   BMI 21.93 kg/m  Gen:   Awake, tearful Resp:  Normal effort  MSK:   Globally decreased strength of bilateral lower extremities, swelling noted to bilateral knees.  No redness, heat, significant joint effusion noted.  She does have hematoma noted back of head, right eyebrow, otherwise CN II through XII grossly intact, no other significant injuries noted. Medical Decision Making  Medically screening exam initiated at 3:38 PM.  Appropriate orders placed.  Kirsten Harper was informed that the remainder of the evaluation will be completed by another provider, this initial triage assessment does not replace that evaluation, and the importance of remaining in the ED until their evaluation is complete.  Workup initiated   Anselmo Pickler, [place] 03/06/22 1540

## 2022-03-06 NOTE — ED Provider Notes (Addendum)
[facility] EMERGENCY DEPARTMENT Provider Note   CSN: [id number] Arrival date & time: 03/06/22  1419     History  Chief Complaint  Patient presents with   Fall   Weakness    Generalized     Kirsten Harper is a 76 y.o. female.  Patient is a 76 yo female with pmh of ankylosing spondylitis, ckd, copd, and anxiety presenting for fall and chronic pain. Pt states her ankylosing spondylitis has called severe back pain over the past 40 years. States her pain is so severe at this point that she is having difficulty moving. States she had difficulty getting out of her chair last night when she fell backwards hitting the back of her head. No LOC. No blood thinners.   The history is provided by the patient. No language interpreter was used.  Fall Pertinent negatives include no chest pain, no abdominal pain and no shortness of breath.  Weakness Associated symptoms: no abdominal pain, no arthralgias, no chest pain, no cough, no dysuria, no fever, no seizures, no shortness of breath and no vomiting        Home Medications Prior to Admission medications   Medication Sig Start Date End Date Taking? Authorizing Provider  acetaminophen (TYLENOL) 325 MG tablet Take 650 mg by mouth every 6 (six) hours as needed.    [provider]  Adalimumab (HUMIRA PEN) 40 MG/0.4ML PNKT Inject 40 mg into the skin every 14 (fourteen) days. 01/01/22   Ofilia Neas, PA-C  ALPRAZolam Duanne Moron) 0.5 MG tablet Take 0.5 mg by mouth 2 (two) times daily. 09/11/20   [provider]  amitriptyline (ELAVIL) 100 MG tablet Take 100 mg by mouth at bedtime. Patient not taking: Reported on 11/01/2021    [provider]  amLODipine (NORVASC) 10 MG tablet Take 1 tablet (10 mg total) by mouth daily. Patient taking differently: Take 5 mg by mouth daily. 06/27/21   Venia Carbon, MD  cephALEXin (KEFLEX) 500 MG capsule Take 1 capsule (500 mg total) by mouth 3 (three) times daily. 03/01/22   Lajean Saver, MD   cetirizine (ZYRTEC) 10 MG tablet Take 10 mg by mouth daily.    [provider]  dorzolamide-timolol (COSOPT) 22.3-6.8 MG/ML ophthalmic solution Place 1 drop into both eyes 2 (two) times daily.  12/17/16   [provider]  Doxepin HCl 6 MG TABS Take 1 tablet by mouth at bedtime. Patient not taking: Reported on 02/28/2022 02/26/22   [provider]  escitalopram (LEXAPRO) 10 MG tablet Take 10 mg by mouth daily. 03/07/20   [provider]  fluticasone (FLONASE) 50 MCG/ACT nasal spray Place 1 spray into both nostrils daily. 06/26/20   [provider]  folic acid (FOLVITE) 1 MG tablet TAKE 1 TABLET BY MOUTH EVERY DAY 07/26/21   Ofilia Neas, PA-C  gabapentin (NEURONTIN) 300 MG capsule Take 1-2 capsules (300-600 mg total) by mouth at bedtime. For restless legs 09/25/21   Viviana Simpler I, MD  losartan (COZAAR) 100 MG tablet TAKE 1 TABLET BY MOUTH EVERY DAY 02/06/22   Venia Carbon, MD  LOTEMAX 0.5 % ophthalmic suspension Place 1 drop into both eyes 2 (two) times daily.  12/17/16   [provider]  traMADol (ULTRAM) 50 MG tablet Take 1 tablet (50 mg total) by mouth every 8 (eight) hours as needed. 03/01/22   Lajean Saver, MD  TUBERCULIN SYR 1CC/27GX1/2" (B-D TB SYRINGE 1CC/27GX1/2") 27G X 1/2" 1 ML MISC 12 Syringes by Does not apply  route once a week. 03/30/21   Ofilia Neas, PA-C  VENTOLIN HFA 108 (90 Base) MCG/ACT inhaler INHALE 1 TO 2 PUFFS INTO THE LUNGS EVERY 6 HOURS AS NEEDED 02/04/22   Venia Carbon, MD      Allergies    Amoxapine and related, Hydrocodone bit-homatrop mbr, Seroquel [quetiapine], and Other    Review of Systems   Review of Systems  Constitutional:  Negative for chills and fever.  HENT:  Negative for ear pain and sore throat.   Eyes:  Negative for pain and visual disturbance.  Respiratory:  Negative for cough and shortness of breath.   Cardiovascular:  Negative for chest pain and palpitations.  Gastrointestinal:  Negative  for abdominal pain and vomiting.  Genitourinary:  Negative for dysuria and hematuria.  Musculoskeletal:  Negative for arthralgias and back pain.  Skin:  Negative for color change and rash.  Neurological:  Positive for weakness. Negative for seizures and syncope.  All other systems reviewed and are negative.   Physical Exam Updated Vital Signs BP (!) 131/52   Pulse 86   Temp 98.4 F (36.9 C) (Oral)   Resp (!) 21   Ht '5\' 7"'$  (1.702 m)   Wt 63.5 kg   SpO2 95%   BMI 21.93 kg/m  Physical Exam Vitals and nursing note reviewed.  Constitutional:      General: She is not in acute distress.    Appearance: She is well-developed.  HENT:     Head: Normocephalic.   Eyes:     Conjunctiva/sclera: Conjunctivae normal.  Cardiovascular:     Rate and Rhythm: Normal rate and regular rhythm.     Heart sounds: No murmur heard. Pulmonary:     Effort: Pulmonary effort is normal. No respiratory distress.     Breath sounds: Normal breath sounds.  Abdominal:     Palpations: Abdomen is soft.     Tenderness: There is no abdominal tenderness.  Musculoskeletal:        General: No swelling.     Cervical back: Neck supple.  Skin:    General: Skin is warm and dry.     Capillary Refill: Capillary refill takes less than 2 seconds.  Neurological:     Mental Status: She is alert and oriented to person, place, and time.     GCS: GCS eye subscore is 4. GCS verbal subscore is 5. GCS motor subscore is 6.     Cranial Nerves: Cranial nerves 2-12 are intact.     Sensory: Sensation is intact.     Motor: Motor function is intact.     Coordination: Coordination is intact.  Psychiatric:        Mood and Affect: Mood normal.     ED Results / Procedures / Treatments   Labs (all labs ordered are listed, but only abnormal results are displayed) Labs Reviewed  CBC - Abnormal; Notable for the following components:      Result Value   RBC 3.65 (*)    Hemoglobin 11.0 (*)    HCT 32.5 (*)    All other components  within normal limits  BASIC METABOLIC PANEL - Abnormal; Notable for the following components:   BUN 24 (*)    All other components within normal limits  CBG MONITORING, ED    EKG None  Radiology CT Head Wo Contrast  Result Date: 03/06/2022 CLINICAL DATA:  Head trauma, moderate-severe Generalized weakness. Multiple falls including fall last night. Hematoma to back of head. EXAM: CT HEAD WITHOUT  CONTRAST TECHNIQUE: Contiguous axial images were obtained from the base of the skull through the vertex without intravenous contrast. RADIATION DOSE REDUCTION: This exam was performed according to the departmental dose-optimization program which includes automated exposure control, adjustment of the mA and/or kV according to patient size and/or use of iterative reconstruction technique. COMPARISON:  None Available. FINDINGS: Brain: No intracranial hemorrhage, mass effect, or midline shift. No hydrocephalus. The basilar cisterns are patent. There is a remote lacunar infarct in the left basal ganglia. 5 No evidence of territorial infarct or acute ischemia. No extra-axial or intracranial fluid collection. Vascular: No hyperdense vessel or unexpected calcification. Skull: No fracture or focal lesion. Sinuses/Orbits: No acute findings. Paranasal sinuses and mastoid air cells are clear. Other: Left occipital scalp hematoma. IMPRESSION: 1. Left occipital scalp hematoma. No acute intracranial abnormality. No skull fracture. 2. Remote lacunar infarct in the left basal ganglia. Electronically Signed   By: Keith Rake M.D.   On: 03/06/2022 17:25   CT Cervical Spine Wo Contrast  Result Date: 03/06/2022 CLINICAL DATA:  Neck trauma (Age >= 65y) Weakness.  Multiple falls, glued Ng fall last night. EXAM: CT CERVICAL SPINE WITHOUT CONTRAST TECHNIQUE: Multidetector CT imaging of the cervical spine was performed without intravenous contrast. Multiplanar CT image reconstructions were also generated. RADIATION DOSE REDUCTION:  This exam was performed according to the departmental dose-optimization program which includes automated exposure control, adjustment of the mA and/or kV according to patient size and/or use of iterative reconstruction technique. COMPARISON:  None Available. FINDINGS: Alignment: Normal. Skull base and vertebrae: No acute fracture. Vertebral body heights are maintained. The dens and skull base are intact. Soft tissues and spinal canal: No prevertebral fluid or swelling. No visible canal hematoma. Disc levels:  Disc space narrowing and spurring at C5-C6 and C6-C7. Upper chest: No acute or unexpected findings. Other: None. IMPRESSION: Mild degenerative change in the cervical spine without acute fracture or subluxation. Electronically Signed   By: Keith Rake M.D.   On: 03/06/2022 17:22   DG Knee Complete 4 Views Left  Result Date: 03/06/2022 CLINICAL DATA:  Fall, injury EXAM: LEFT KNEE - COMPLETE 4+ VIEW COMPARISON:  None Available. FINDINGS: Joint space narrowing in the medial and patellofemoral compartments. Small joint effusion. No acute bony abnormality. Specifically, no fracture, subluxation, or dislocation. IMPRESSION: No acute bony abnormality.  Small joint effusion. Electronically Signed   By: Rolm Baptise M.D.   On: 03/06/2022 17:01   DG Knee Complete 4 Views Right  Result Date: 03/06/2022 CLINICAL DATA:  Fall, injury EXAM: RIGHT KNEE - COMPLETE 4+ VIEW COMPARISON:  None Available. FINDINGS: Mild joint space narrowing and spurring in the medial compartment. Joint space narrowing in the patellofemoral compartment. No acute bony abnormality. Specifically, no fracture, subluxation, or dislocation. No joint effusion. IMPRESSION: No acute bony abnormality. Electronically Signed   By: Rolm Baptise M.D.   On: 03/06/2022 17:00   DG Shoulder Right  Result Date: 03/06/2022 CLINICAL DATA:  Weakness, chronic pain EXAM: RIGHT SHOULDER - 2+ VIEW COMPARISON:  05/10/2021 FINDINGS: Moderate to advanced  degenerative changes in the right AC joint. Glenohumeral joint is maintained. Subacromial spurring. No acute bony abnormality. Specifically, no fracture, subluxation, or dislocation. IMPRESSION: Degenerative changes in the right [facility] joint with subacromial spurring. No acute bony abnormality. Electronically Signed   By: Rolm Baptise M.D.   On: 03/06/2022 17:00    Procedures Procedures    Medications Ordered in ED Medications  oxyCODONE (Oxy IR/ROXICODONE) immediate release tablet 10 mg (has no  administration in time range)  cephALEXin (KEFLEX) capsule 500 mg (has no administration in time range)    ED Course/ Medical Decision Making/ A&P                           Medical Decision Making Risk Prescription drug management.   9:15 PM  76 yo female with pmh of ankylosing spondylitis, ckd, copd, and anxiety presenting for fall and chronic pain. Pt is Aox3, no acute distress, afebrile, with stable vitals. Physical exam demonstrates no neurovascular deficits. Stable cardiac workup. No hypoxia. No anemia. Pt attributes pain to ankylosing spondylitis. Already on steroids. Pain meds given. CT head, neck, demonstrates scalp hematoma only. Knee xrays demonstrates no fractures. Shoulder xray demonstrates no fractures. No dislocations. Soft compartments.   Social work consult placed for home nursing/help.  Patient in no distress and overall condition improved here in the ED. Detailed discussions were had with the patient regarding current findings, and need for close f/u with PCP or on call doctor. The patient has been instructed to return immediately if the symptoms worsen in any way for re-evaluation. Patient verbalized understanding and is in agreement with current care plan. All questions answered prior to discharge.         Final Clinical Impression(s) / ED Diagnoses Final diagnoses:  Other chronic pain  Ankylosing spondylitis, unspecified site of spine ([place])  Fall, initial encounter   Blunt head trauma, initial encounter  Hematoma of scalp, initial encounter    Rx / DC Orders ED Discharge Orders     None         Lianne Cure, DO 32/95/18 8416    Campbell Stall P, DO 60/63/01 2144

## 2022-03-07 ENCOUNTER — Telehealth: Payer: Self-pay

## 2022-03-07 ENCOUNTER — Ambulatory Visit: Payer: Medicare Other | Admitting: Internal Medicine

## 2022-03-07 NOTE — Telephone Encounter (Signed)
Per chart review tab pt was seen at [facility] ED on 03/06/22.pt is already scheduled for appt with Dr Silvio Pate on 03/07/22 at 2 PM. Sending note to Dr Silvio Pate and Larene Beach CMA.   [place] Night - Client TELEPHONE ADVICE RECORD AccessNurse Patient Name: Kirsten Harper TE Gender: Female DOB: 18-Aug-1945 Age: 76 Y 54 M 27 D Return Phone Number: [phone] (Primary) Address: [place]  [address] Client [facility] Night - Client Client Site [place] Provider Viviana Simpler- MD Contact Type Call Who Is Calling Patient / Member / Family / Caregiver Call Type Triage / Clinical Relationship To Patient Self Return Phone Number [phone] (Primary) Chief Complaint CHEST PAIN - pain, pressure, heaviness or tightness Reason for Call Symptomatic / Request for [place] states is having pain all over. She was at the ER last night. Translation No Nurse Assessment Nurse: Files, RN, Dustin Date/Time (Eastern Time): 03/07/2022 5:34:28 AM Confirm and document reason for call. If symptomatic, describe symptoms. ---Caller states she was in the ED is having pain all over, its everywhere she can think of, pain is 10/10, it is everywhere. Ankylolosis spondylitis Does the patient have any new or worsening symptoms? ---Yes Will a triage be completed? ---Yes Related visit to physician within the last 2 weeks? ---Yes Does the PT have any chronic conditions? (i.e. diabetes, asthma, this includes High risk factors for pregnancy, etc.) ---Yes List chronic conditions. ---Ankylosing Spondylitis Is this a behavioral health or substance abuse call? ---No Disp. Time Eilene Ghazi Time) Disposition Final User 03/07/2022 5:32:17 AM Send to Urgent Colonel Bald 03/07/2022 5:41:22 AM Clinical Call Yes Files, RN, Rachel Bo Final Disposition 03/07/2022 5:41:22 AM Clinical Call  Yes Files, RN, York Grice Disagree/Comply Comply Caller Understands Yes PLEASE NOTE: All timestamps contained within this report are represented as [place] Standard Time. CONFIDENTIALTY NOTICE: This fax transmission is intended only for the addressee. It contains information that is legally privileged, confidential or otherwise protected from use or disclosure. If you are not the intended recipient, you are strictly prohibited from reviewing, disclosing, copying using or disseminating any of this information or taking any action in reliance on or regarding this information. If you have received this fax in error, please notify us immediately by telephone so that we can arrange for its return to [place]. Phone: [phone], Toll-Free: [phone], Fax: [phone] Page: 2 of 2 Call Id: [id number] Comments User: Aquilla Solian, RN Date/Time Eilene Ghazi Time): 03/07/2022 5:41:01 AM Caller is located is in a hospital room right now in room A10, has been seen by MD and RNs, she is upset that they are going to send her home, directed caller to talk with her RN that is seeing her

## 2022-03-07 NOTE — Telephone Encounter (Signed)
I will hold this to see if she makes it to the appt today. We will need F2F documentation for [facility] anyway.

## 2022-03-07 NOTE — Telephone Encounter (Signed)
I spoke with [facility] CMA and if pt can come at 1:30 to ck in [place] will change appt to 1:45 instead of 2 PM. I spoke with pt and she said she was trying to call [facility] to let us know she cannot walk and will need to cancel appt with Dr Silvio Pate today. I asked pt if she was sure because we had a similar conversation the other day and I cancelled the appt at pt request and pt was going to ED and then pt showed up at [facility] at 4 PM for an appt that was not there. Pt said she remembers but today the only way she can come to [facility] appt is to come by gurney. Pt did not want me to call her sister who brought her to the last appt. Pt said not to cancel appt and she will call EMS to see if they can bring her to [facility] today at 1:30 and pt will cb and let our office know if she is going to keep the appt today at 1:30 or not.  [place] office personnel notified as well.Sending note to [facility] CMA.

## 2022-03-07 NOTE — ED Notes (Signed)
Pt states that she is "not right mentally". Pt states that she has troubles with memory. Pt states that she has a hx of losing her keys or misplacing them. Through conversation, pt is concerned about finding help due to financial strain. Pt states children live in [place] so cannot assist with transporting pt back home.

## 2022-03-07 NOTE — ED Notes (Signed)
Reviewed d/c paperwork with pt,  refused to sign for d/c. Pt left with ems transport home. Sister, vickie, called to give update.

## 2022-03-07 NOTE — Telephone Encounter (Signed)
Vaughan Basta a nurse with [facility] called and said they are trying to get patient some help. She wanted to know if Dr Silvio Pate would sign follow up orders for patient. Her phone number is [phone]. She is following up from patients ER visit yesterday

## 2022-03-07 NOTE — Telephone Encounter (Signed)
Thank you. I will keep an eye out for her.

## 2022-03-07 NOTE — Telephone Encounter (Signed)
Left message on VM for Kirsten Harper at [facility] advising her Dr Silvio Pate has not seen her to do a Face to Face encounter for the current episode. Asked if recent ER visits and visit with [facility] work for that.

## 2022-03-07 NOTE — ED Notes (Addendum)
CSW spoke with pt about options for discharge planning. Pt states that she needs to go to rehab. CSW explained that due to pts insurance she does not qualify for SNF. CSW explained that pt would need a qualifying diagnosis for admission and a 3 midnight inpatient stay. Pt asked if she could stay in hospital three nights. CSW explained that pt is is in the ED and does not qualify. CSW explained that pt can private pay the cost which is about 8,000 for a month at a facility. Pt states she cannot do this.   CSW explained [facility] services can be set up for pt at this time. CSW explained that the [facility] agency will reach out to pt via phone to set up appointment times. CSW also explained that if pt wanted placement in LTC she could reach out to her PCP and work on this from the community. Pt is understanding of this and requests [facility] with no preference in company. CSW requested that [facility] PT/RN/Aide/SW orders. CSW spoke to [place] with [facility] who states they can accept the pts [facility] referral. TOC signing off.

## 2022-03-28 ENCOUNTER — Other Ambulatory Visit: Payer: Self-pay | Admitting: *Deleted

## 2022-03-28 DIAGNOSIS — M457 Ankylosing spondylitis of lumbosacral region: Secondary | ICD-10-CM

## 2022-03-28 MED ORDER — HUMIRA (2 PEN) 40 MG/0.4ML ~~LOC~~ AJKT: 40.0000 mg | AUTO-INJECTOR | SUBCUTANEOUS | 0 refills | Status: AC

## 2022-03-28 NOTE — Telephone Encounter (Signed)
Refill request received via fax from [facility] for Humira.  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

## 2022-04-10 ENCOUNTER — Encounter: Payer: Self-pay | Admitting: *Deleted

## 2022-04-10 ENCOUNTER — Ambulatory Visit: Payer: No Typology Code available for payment source | Attending: Rheumatology | Admitting: Rheumatology

## 2022-05-06 ENCOUNTER — Telehealth: Payer: Self-pay | Admitting: Internal Medicine

## 2022-05-06 NOTE — Telephone Encounter (Signed)
That is very sad. I am sorry to hear that

## 2022-05-06 NOTE — Telephone Encounter (Signed)
[facility] called in stated they receive return mail stating pt was decease . No record of this wanted to make PCP aware
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
## 2022-10-01 ENCOUNTER — Ambulatory Visit: Payer: Medicare Other | Admitting: Internal Medicine
